# Patient Record
Sex: Female | Born: 1950
Health system: Southern US, Community
[De-identification: ages and names within clinical notes are randomized; demographics above are authoritative.]

## PROBLEM LIST (undated history)

## (undated) DIAGNOSIS — G43909 Migraine, unspecified, not intractable, without status migrainosus: Secondary | ICD-10-CM

## (undated) DIAGNOSIS — J069 Acute upper respiratory infection, unspecified: Secondary | ICD-10-CM

## (undated) DIAGNOSIS — R011 Cardiac murmur, unspecified: Secondary | ICD-10-CM

## (undated) DIAGNOSIS — Z8041 Family history of malignant neoplasm of ovary: Secondary | ICD-10-CM

## (undated) DIAGNOSIS — Z8049 Family history of malignant neoplasm of other genital organs: Secondary | ICD-10-CM

## (undated) DIAGNOSIS — D219 Benign neoplasm of connective and other soft tissue, unspecified: Secondary | ICD-10-CM

## (undated) DIAGNOSIS — B359 Dermatophytosis, unspecified: Secondary | ICD-10-CM

## (undated) DIAGNOSIS — K219 Gastro-esophageal reflux disease without esophagitis: Secondary | ICD-10-CM

## (undated) DIAGNOSIS — I10 Essential (primary) hypertension: Secondary | ICD-10-CM

## (undated) DIAGNOSIS — E7800 Pure hypercholesterolemia, unspecified: Secondary | ICD-10-CM

## (undated) DIAGNOSIS — M1990 Unspecified osteoarthritis, unspecified site: Secondary | ICD-10-CM

## (undated) DIAGNOSIS — H269 Unspecified cataract: Secondary | ICD-10-CM

## (undated) DIAGNOSIS — T783XXA Angioneurotic edema, initial encounter: Secondary | ICD-10-CM

## (undated) DIAGNOSIS — Z8489 Family history of other specified conditions: Secondary | ICD-10-CM

## (undated) DIAGNOSIS — Z800 Family history of malignant neoplasm of digestive organs: Secondary | ICD-10-CM

## (undated) HISTORY — DX: Dermatophytosis, unspecified: B35.9

## (undated) HISTORY — PX: TONSILLECTOMY: SUR1361

## (undated) HISTORY — PX: BUNIONECTOMY: SHX129

## (undated) HISTORY — PX: OTHER SURGICAL HISTORY: SHX169

## (undated) HISTORY — DX: Essential (primary) hypertension: I10

## (undated) HISTORY — PX: JOINT REPLACEMENT: SHX530

## (undated) HISTORY — DX: Family history of malignant neoplasm of ovary: Z80.41

## (undated) HISTORY — DX: Unspecified cataract: H26.9

## (undated) HISTORY — DX: Family history of malignant neoplasm of digestive organs: Z80.0

## (undated) HISTORY — PX: EYE SURGERY: SHX253

## (undated) HISTORY — DX: Acute upper respiratory infection, unspecified: J06.9

## (undated) HISTORY — DX: Family history of malignant neoplasm of other genital organs: Z80.49

## (undated) HISTORY — DX: Benign neoplasm of connective and other soft tissue, unspecified: D21.9

## (undated) HISTORY — DX: Angioneurotic edema, initial encounter: T78.3XXA

---
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
## 1988-10-20 HISTORY — PX: TOTAL ABDOMINAL HYSTERECTOMY: SHX209

## 1998-10-04 ENCOUNTER — Ambulatory Visit (HOSPITAL_BASED_OUTPATIENT_CLINIC_OR_DEPARTMENT_OTHER): Admission: RE | Admit: 1998-10-04 | Discharge: 1998-10-04 | Payer: Self-pay | Admitting: Orthopaedic Surgery

## 1999-04-17 ENCOUNTER — Encounter: Admission: RE | Admit: 1999-04-17 | Discharge: 1999-04-17 | Payer: Self-pay | Admitting: Family Medicine

## 1999-06-03 ENCOUNTER — Other Ambulatory Visit: Admission: RE | Admit: 1999-06-03 | Discharge: 1999-06-03 | Payer: Self-pay | Admitting: Obstetrics and Gynecology

## 2000-06-18 ENCOUNTER — Other Ambulatory Visit: Admission: RE | Admit: 2000-06-18 | Discharge: 2000-06-18 | Payer: Self-pay | Admitting: Obstetrics and Gynecology

## 2000-11-09 ENCOUNTER — Ambulatory Visit (HOSPITAL_COMMUNITY): Admission: RE | Admit: 2000-11-09 | Discharge: 2000-11-09 | Payer: Self-pay | Admitting: Internal Medicine

## 2001-05-01 ENCOUNTER — Encounter: Payer: Self-pay | Admitting: Orthopedic Surgery

## 2001-05-01 ENCOUNTER — Encounter: Admission: RE | Admit: 2001-05-01 | Discharge: 2001-05-01 | Payer: Self-pay | Admitting: Orthopedic Surgery

## 2001-06-23 ENCOUNTER — Other Ambulatory Visit: Admission: RE | Admit: 2001-06-23 | Discharge: 2001-06-23 | Payer: Self-pay | Admitting: Obstetrics and Gynecology

## 2002-04-21 ENCOUNTER — Encounter: Admission: RE | Admit: 2002-04-21 | Discharge: 2002-04-21 | Payer: Self-pay | Admitting: Orthopaedic Surgery

## 2002-04-21 ENCOUNTER — Encounter: Payer: Self-pay | Admitting: Orthopaedic Surgery

## 2002-05-11 ENCOUNTER — Encounter: Payer: Self-pay | Admitting: Orthopaedic Surgery

## 2002-05-11 ENCOUNTER — Encounter: Admission: RE | Admit: 2002-05-11 | Discharge: 2002-05-11 | Payer: Self-pay | Admitting: Orthopaedic Surgery

## 2002-05-30 ENCOUNTER — Encounter: Admission: RE | Admit: 2002-05-30 | Discharge: 2002-05-30 | Payer: Self-pay | Admitting: Orthopaedic Surgery

## 2002-05-30 ENCOUNTER — Encounter: Payer: Self-pay | Admitting: Orthopaedic Surgery

## 2002-06-29 ENCOUNTER — Other Ambulatory Visit: Admission: RE | Admit: 2002-06-29 | Discharge: 2002-06-29 | Payer: Self-pay | Admitting: Obstetrics and Gynecology

## 2002-08-11 ENCOUNTER — Encounter: Admission: RE | Admit: 2002-08-11 | Discharge: 2002-08-11 | Payer: Self-pay | Admitting: Orthopaedic Surgery

## 2002-08-11 ENCOUNTER — Encounter: Payer: Self-pay | Admitting: Orthopaedic Surgery

## 2003-07-05 ENCOUNTER — Other Ambulatory Visit: Admission: RE | Admit: 2003-07-05 | Discharge: 2003-07-05 | Payer: Self-pay | Admitting: Obstetrics and Gynecology

## 2003-11-30 ENCOUNTER — Encounter: Admission: RE | Admit: 2003-11-30 | Discharge: 2003-11-30 | Payer: Self-pay | Admitting: Orthopaedic Surgery

## 2003-12-20 ENCOUNTER — Encounter: Admission: RE | Admit: 2003-12-20 | Discharge: 2003-12-20 | Payer: Self-pay | Admitting: Orthopaedic Surgery

## 2004-01-05 ENCOUNTER — Encounter: Admission: RE | Admit: 2004-01-05 | Discharge: 2004-01-05 | Payer: Self-pay | Admitting: Internal Medicine

## 2004-08-01 ENCOUNTER — Other Ambulatory Visit: Admission: RE | Admit: 2004-08-01 | Discharge: 2004-08-01 | Payer: Self-pay | Admitting: Obstetrics and Gynecology

## 2004-08-01 ENCOUNTER — Encounter: Admission: RE | Admit: 2004-08-01 | Discharge: 2004-09-30 | Payer: Self-pay | Admitting: Orthopaedic Surgery

## 2004-10-11 ENCOUNTER — Encounter: Admission: RE | Admit: 2004-10-11 | Discharge: 2004-10-11 | Payer: Self-pay | Admitting: Orthopaedic Surgery

## 2004-11-06 ENCOUNTER — Encounter: Admission: RE | Admit: 2004-11-06 | Discharge: 2004-12-12 | Payer: Self-pay | Admitting: Orthopaedic Surgery

## 2005-08-05 ENCOUNTER — Other Ambulatory Visit: Admission: RE | Admit: 2005-08-05 | Discharge: 2005-08-05 | Payer: Self-pay | Admitting: Obstetrics and Gynecology

## 2006-01-18 ENCOUNTER — Encounter: Admission: RE | Admit: 2006-01-18 | Discharge: 2006-01-18 | Payer: Self-pay | Admitting: Orthopaedic Surgery

## 2006-02-24 ENCOUNTER — Encounter: Admission: RE | Admit: 2006-02-24 | Discharge: 2006-02-24 | Payer: Self-pay | Admitting: Ophthalmology

## 2006-04-01 ENCOUNTER — Encounter: Admission: RE | Admit: 2006-04-01 | Discharge: 2006-04-01 | Payer: Self-pay | Admitting: Orthopaedic Surgery

## 2006-07-28 ENCOUNTER — Other Ambulatory Visit: Admission: RE | Admit: 2006-07-28 | Discharge: 2006-07-28 | Payer: Self-pay | Admitting: Obstetrics and Gynecology

## 2006-10-06 ENCOUNTER — Encounter: Admission: RE | Admit: 2006-10-06 | Discharge: 2006-10-06 | Payer: Self-pay | Admitting: Orthopaedic Surgery

## 2007-03-26 ENCOUNTER — Encounter: Admission: RE | Admit: 2007-03-26 | Discharge: 2007-03-26 | Payer: Self-pay | Admitting: Otolaryngology

## 2007-04-15 ENCOUNTER — Encounter: Admission: RE | Admit: 2007-04-15 | Discharge: 2007-04-15 | Payer: Self-pay | Admitting: Otolaryngology

## 2007-08-04 ENCOUNTER — Other Ambulatory Visit: Admission: RE | Admit: 2007-08-04 | Discharge: 2007-08-04 | Payer: Self-pay | Admitting: Obstetrics and Gynecology

## 2008-08-04 ENCOUNTER — Ambulatory Visit: Payer: Self-pay | Admitting: Obstetrics and Gynecology

## 2008-08-04 ENCOUNTER — Encounter: Payer: Self-pay | Admitting: Obstetrics and Gynecology

## 2008-08-04 ENCOUNTER — Other Ambulatory Visit: Admission: RE | Admit: 2008-08-04 | Discharge: 2008-08-04 | Payer: Self-pay | Admitting: Obstetrics and Gynecology

## 2008-10-16 ENCOUNTER — Ambulatory Visit: Payer: Self-pay | Admitting: Gastroenterology

## 2008-11-02 ENCOUNTER — Telehealth: Payer: Self-pay | Admitting: Gastroenterology

## 2008-11-03 ENCOUNTER — Ambulatory Visit: Payer: Self-pay | Admitting: Gastroenterology

## 2008-11-13 ENCOUNTER — Telehealth: Payer: Self-pay | Admitting: Gastroenterology

## 2008-11-16 ENCOUNTER — Ambulatory Visit: Payer: Self-pay | Admitting: Obstetrics and Gynecology

## 2008-12-21 ENCOUNTER — Ambulatory Visit: Payer: Self-pay | Admitting: Obstetrics and Gynecology

## 2009-08-21 ENCOUNTER — Ambulatory Visit: Payer: Self-pay | Admitting: Obstetrics and Gynecology

## 2009-08-31 ENCOUNTER — Ambulatory Visit: Payer: Self-pay | Admitting: Obstetrics and Gynecology

## 2009-08-31 ENCOUNTER — Encounter: Payer: Self-pay | Admitting: Obstetrics and Gynecology

## 2009-08-31 ENCOUNTER — Other Ambulatory Visit: Admission: RE | Admit: 2009-08-31 | Discharge: 2009-08-31 | Payer: Self-pay | Admitting: Obstetrics and Gynecology

## 2009-12-17 ENCOUNTER — Ambulatory Visit: Payer: Self-pay | Admitting: Obstetrics and Gynecology

## 2009-12-24 ENCOUNTER — Ambulatory Visit: Payer: Self-pay | Admitting: Obstetrics and Gynecology

## 2010-09-09 ENCOUNTER — Encounter: Admission: RE | Admit: 2010-09-09 | Discharge: 2010-09-09 | Payer: Self-pay | Admitting: Orthopaedic Surgery

## 2010-10-20 HISTORY — PX: TOTAL KNEE ARTHROPLASTY: SHX125

## 2010-11-04 ENCOUNTER — Other Ambulatory Visit
Admission: RE | Admit: 2010-11-04 | Discharge: 2010-11-04 | Payer: Self-pay | Source: Home / Self Care | Admitting: Obstetrics and Gynecology

## 2010-11-04 ENCOUNTER — Ambulatory Visit
Admission: RE | Admit: 2010-11-04 | Discharge: 2010-11-04 | Payer: Self-pay | Source: Home / Self Care | Attending: Obstetrics and Gynecology | Admitting: Obstetrics and Gynecology

## 2010-11-09 ENCOUNTER — Encounter: Payer: Self-pay | Admitting: Orthopaedic Surgery

## 2011-04-16 ENCOUNTER — Encounter (HOSPITAL_COMMUNITY)
Admission: RE | Admit: 2011-04-16 | Discharge: 2011-04-16 | Disposition: A | Discharge status: Home / Self Care | Payer: BC Managed Care – PPO | Source: Ambulatory Visit | Attending: Orthopaedic Surgery | Admitting: Orthopaedic Surgery

## 2011-04-16 ENCOUNTER — Other Ambulatory Visit (HOSPITAL_COMMUNITY): Payer: Self-pay | Admitting: Orthopaedic Surgery

## 2011-04-16 ENCOUNTER — Ambulatory Visit (HOSPITAL_COMMUNITY)
Admission: RE | Admit: 2011-04-16 | Discharge: 2011-04-16 | Disposition: A | Discharge status: Home / Self Care | Payer: BC Managed Care – PPO | Source: Ambulatory Visit | Attending: Orthopaedic Surgery | Admitting: Orthopaedic Surgery

## 2011-04-16 DIAGNOSIS — M1990 Unspecified osteoarthritis, unspecified site: Secondary | ICD-10-CM

## 2011-04-16 DIAGNOSIS — Z01818 Encounter for other preprocedural examination: Secondary | ICD-10-CM | POA: Insufficient documentation

## 2011-04-16 DIAGNOSIS — Z01812 Encounter for preprocedural laboratory examination: Secondary | ICD-10-CM | POA: Insufficient documentation

## 2011-04-16 LAB — COMPREHENSIVE METABOLIC PANEL
Alkaline Phosphatase: 70 U/L (ref 39–117)
BUN: 20 mg/dL (ref 6–23)
CO2: 29 mEq/L (ref 19–32)
Chloride: 101 mEq/L (ref 96–112)
Creatinine, Ser: 0.62 mg/dL (ref 0.50–1.10)
GFR calc non Af Amer: >60 mL/min (ref >60)
Potassium: 4 mEq/L (ref 3.5–5.1)
Total Bilirubin: 0.6 mg/dL (ref 0.3–1.2)

## 2011-04-16 LAB — DIFFERENTIAL
Basophils Absolute: 0 10*3/uL (ref 0.0–0.1)
Basophils Relative: 0 % (ref 0–1)
Eosinophils Relative: 2 % (ref 0–5)
Lymphocytes Relative: 32 % (ref 12–46)

## 2011-04-16 LAB — URINALYSIS, ROUTINE W REFLEX MICROSCOPIC
Leukocytes, UA: NEGATIVE
Nitrite: NEGATIVE
Specific Gravity, Urine: 1.018 (ref 1.005–1.030)
Urobilinogen, UA: 0.2 mg/dL (ref 0.0–1.0)

## 2011-04-16 LAB — CBC
HCT: 38.4 % (ref 36.0–46.0)
Platelets: 300 10*3/uL (ref 150–400)
RDW: 12.6 % (ref 11.5–15.5)
WBC: 7.1 10*3/uL (ref 4.0–10.5)

## 2011-04-16 LAB — TYPE AND SCREEN
ABO/RH(D): A POS
Antibody Screen: NEGATIVE

## 2011-04-16 LAB — SURGICAL PCR SCREEN: MRSA, PCR: NEGATIVE

## 2011-04-16 LAB — ABO/RH: ABO/RH(D): A POS

## 2011-04-17 LAB — URINE CULTURE
Culture  Setup Time: 201206271636
Culture: NO GROWTH

## 2011-04-22 ENCOUNTER — Inpatient Hospital Stay (HOSPITAL_COMMUNITY)
Admission: RE | Admit: 2011-04-22 | Discharge: 2011-04-25 | DRG: 209 | Disposition: A | Discharge status: Home Health | Payer: BC Managed Care – PPO | Source: Ambulatory Visit | Attending: Orthopaedic Surgery | Admitting: Orthopaedic Surgery

## 2011-04-22 DIAGNOSIS — K3184 Gastroparesis: Secondary | ICD-10-CM | POA: Diagnosis not present

## 2011-04-22 DIAGNOSIS — D62 Acute posthemorrhagic anemia: Secondary | ICD-10-CM | POA: Diagnosis not present

## 2011-04-22 DIAGNOSIS — R7309 Other abnormal glucose: Secondary | ICD-10-CM | POA: Diagnosis not present

## 2011-04-22 DIAGNOSIS — M1710 Unilateral primary osteoarthritis, unspecified knee: Principal | ICD-10-CM | POA: Diagnosis present

## 2011-04-23 LAB — CBC
HCT: 28 % — ABNORMAL LOW (ref 36.0–46.0)
MCH: 31.5 pg (ref 26.0–34.0)
MCV: 91.8 fL (ref 78.0–100.0)
RDW: 12.5 % (ref 11.5–15.5)

## 2011-04-23 LAB — BASIC METABOLIC PANEL
BUN: 16 mg/dL (ref 6–23)
Calcium: 8.6 mg/dL (ref 8.4–10.5)
Chloride: 105 mEq/L (ref 96–112)
Creatinine, Ser: 0.59 mg/dL (ref 0.50–1.10)
GFR calc Af Amer: >60 mL/min (ref >60)

## 2011-04-24 DIAGNOSIS — M79609 Pain in unspecified limb: Secondary | ICD-10-CM

## 2011-04-24 LAB — CBC
HCT: 26.2 % — ABNORMAL LOW (ref 36.0–46.0)
MCHC: 34 g/dL (ref 30.0–36.0)
MCV: 90.3 fL (ref 78.0–100.0)
Platelets: 248 10*3/uL (ref 150–400)
RDW: 12.4 % (ref 11.5–15.5)

## 2011-04-24 LAB — BASIC METABOLIC PANEL
BUN: 25 mg/dL — ABNORMAL HIGH (ref 6–23)
Creatinine, Ser: 1.03 mg/dL (ref 0.50–1.10)
GFR calc Af Amer: >60 mL/min (ref >60)
GFR calc non Af Amer: 55 mL/min — ABNORMAL LOW (ref >60)
Potassium: 4.2 mEq/L (ref 3.5–5.1)

## 2011-04-25 LAB — BASIC METABOLIC PANEL
GFR calc Af Amer: >60 mL/min (ref >60)
GFR calc non Af Amer: >60 mL/min (ref >60)
Potassium: 3.1 mEq/L — ABNORMAL LOW (ref 3.5–5.1)
Sodium: 140 mEq/L (ref 135–145)

## 2011-04-25 LAB — CBC
MCHC: 34.6 g/dL (ref 30.0–36.0)
RDW: 12.5 % (ref 11.5–15.5)

## 2011-04-30 NOTE — Op Note (Signed)
Sharon [name], Sharon [name] NO.:  [id number]  MEDICAL RECORD NO.:  [id number]  LOCATION:  2550                         FACILITY:  [facility]  PHYSICIAN:  Claude Manges. Naren Benally, M.D.DATE OF BIRTH:  [birth year]  DATE OF PROCEDURE:  04/22/2011 DATE OF DISCHARGE:                              OPERATIVE REPORT   PREOPERATIVE DIAGNOSIS:  End-stage osteoarthritis, right knee.  POSTOPERATIVE DIAGNOSIS:  End-stage osteoarthritis, right knee.  PROCEDURE:  Right total knee replacement.  SURGEON:  Claude Manges. Cleophas Dunker, M.D.  ASSISTANT:  Arlys John D. Petrarca, P.A.-C.  ANESTHESIA:  General with supplemental femoral nerve block.  COMPLICATIONS:  None.  COMPONENTS:  DePuy LCS standard plus femoral component, #3 rotating tibial tray, a 12.5-mm polyethylene bridging bearing, a three peg metal back rotating patella.  Components were secured with polymethyl methacrylate.  PROCEDURE IN DETAIL:  Sharon [name] was met in the holding area, marked her right knee as the appropriate operative site.  The questions were answered.  She received a preoperative femoral nerve block by Anesthesia.  The patient was then transported to room #1 and placed under general anesthesia without difficulty.  Nursing staff inserted a Foley catheter.  Urine was clear.  Tourniquet was then applied to the right thigh.  The leg was then prepped with Betadine scrub and DuraPrep.  The tourniquet to the midfoot, sterile draping was performed.  The extremity still elevated was Esmarch exsanguinated with a proximal tourniquet at 350 mmHg.  A midline longitudinal incision was made centered about the patella extending from the superior pouch to tibial tubercle via sharp dissection.  Incision carried down to subcutaneous tissue.  Gross bleeders were Bovie coagulated.  The medial parapatellar incision was made with the Bovie through the deep capsule.  The joint was then entered.  There was a clear yellow joint effusion.  The  patella was everted to 180 degrees and knee flexed to 90 degrees.  There was mild to moderate amount of synovitis.  Synovectomy was performed.  There were large osteophytes along the medial and lateral femoral condyle and absence of articular cartilage in the medial femoral condyle and medial tibial plateau.  I templated a standard plus femoral component.  First, bony cut was made transversely in the proximal tibia using the external guide.  We then rechecked our alignment and felt that it was perfectly anatomic.  Subsequent cuts were then made on the femur with a 4-degree distal femoral valgus cut, 12.5 mm flexion/extension gaps were perfectly symmetrical.  MCL and LCL remained intact.  Lamina spreaders were then inserted.  The medial and lateral menisci were excised as well as the ACL and MCL.  Osteophytes removed from the posterior femoral condyles with a curved three-quarter inch osteotome. There was a Baker cyst posteromedially, this was partially excised.  Finishing cut was then made on the femur for tapering and to make the central holes.  Retractors were then placed around the tibia.  A #3 tibial tray was measured and pinned in place.  The central hole was made followed by the keeled cut.  With the tibial tray in place, the 12.5 mm bridging bearing was applied followed by the trial standard plus femoral component. Through a full range  of motion, we had full extension, flexion without malrotation or abnormality, but the tibial tray did not open up medially or laterally.  Patella was prepared by removing 10 mm of bone, three pegs were then made with the template.  Trial patella was inserted and through a full range of motion remained perfectly stable.  The trial components removed.  The joint was copiously irrigated with saline solution.  We changed gloves on several occasions to ensure sterility.  With the knee flexed the retractors were carefully placed around the  tibia.  The #3 tibial tray was then carefully cemented in place.  Extraneous methacrylate was removed from around the edges.  The 12.5-mm polyethylene bridging bearing was then inserted followed by the cemented femoral component.  The knee was placed in full extension.  We had excellent fit of all the components.  Any further extraneous methacrylate was removed from around the femur and the tibia.  Patella was applied with a patellar clamp and methacrylate.  After approximately 15 minutes, the methacrylate was matured and hardened.  The joint was inspected.  It was clear of any extraneous methacrylate.  Marcaine 0.25% with epinephrine was injected to the deep capsule.  At that point, the tourniquet was deflated about an hour and fifteen minutes.  We had immediate capillary refill to the joint.  Any gross bleeders were Bovie coagulated.  Bone wax was used to obtain hemostasis from any bone bleeding and pin insertion sites.  A medium Hemovac was inserted.  The deep capsule was closed with #1 interrupted Ethibond.  Superficial capsule with running 0 Vicryl, subcu with 2-0 Vicryl and 3-0 Monocryl, skin closed with skin clips.  Sterile bulky dressing was applied followed by the patient's support stocking and a Mepilex dressing.  The patient tolerated the procedure without complications.     Claude Manges. Cleophas Dunker, M.D.     PWW/MEDQ  D:  04/22/2011  T:  04/22/2011  Job:  [id number]  Electronically Signed by Norlene Campbell M.D. on 04/30/2011 11:42:21 AM

## 2011-05-12 ENCOUNTER — Ambulatory Visit (INDEPENDENT_AMBULATORY_CARE_PROVIDER_SITE_OTHER): Payer: BC Managed Care – PPO | Admitting: Obstetrics and Gynecology

## 2011-05-12 ENCOUNTER — Encounter: Payer: Self-pay | Admitting: Obstetrics and Gynecology

## 2011-05-12 DIAGNOSIS — R300 Dysuria: Secondary | ICD-10-CM

## 2011-05-12 MED ORDER — NITROFURANTOIN MONOHYD MACRO 100 MG PO CAPS: 100.0000 mg | ORAL_CAPSULE | Freq: Two times a day (BID) | ORAL | Status: AC

## 2011-05-12 NOTE — Progress Notes (Signed)
The patient came to see me today with a 3 day history of dysuria frequency and urgency. She had a very abnormal urinalysis showing multiple white blood cells and red blood cells she says she has very frequent infrequent urinary tract infections. Assessment urinary tract infection plan Macrobid 1 twice a day with food for 7 days return to the office in one week for followup urinalysis.

## 2011-05-16 NOTE — Discharge Summary (Signed)
NAMEBRONWEN, Sharon [name] NO.:  [id number]  MEDICAL RECORD NO.:  [id number]  LOCATION:  5038                         FACILITY:  [facility]  PHYSICIAN:  Claude Manges. Marche Hottenstein, M.D.DATE OF BIRTH:  [birth year]  DATE OF ADMISSION:  04/22/2011 DATE OF DISCHARGE:  04/25/2011                              DISCHARGE SUMMARY   ADMISSION DIAGNOSIS:  Osteoarthritis of the right knee.  DISCHARGE DIAGNOSES: 1. Osteoarthritis of the right knee. 2. History of hypertension. 3. History of migraines. 4. Acute blood loss anemia.  PROCEDURE:  Right total knee arthroplasty.  HISTORY:  Sharon [name] is a very pleasant 60 year old white female who was seen and evaluated in our office for some time.  She has been having constant moderate aching pain which is interfering with her activities of daily living and sleep.  She is also having nighttime pain.  She has radiographic bone-on-bone medial compartment OA.  She has failed conservative treatment.  She is indicated for right total knee arthroplasty.  HOSPITAL COURSE:  This is a 60 year old white female, admitted on April 22, 2011.  After appropriate laboratory studies were obtained as well as 1 g vancomycin IV on-call to the operating room, she was taken to the operating room where she underwent a right total knee arthroplasty by Sharon Campbell, MD, assisted by Oris Drone. Petrarca, PA-C.  This involved a DePuy LCS standard plus femoral component with a #3 rotating tibial tray and a 12.5 polyethylene bridging bearing and three-peg metal back rotating patella.  Components were all secured with polymethyl methacrylate.  She tolerated the procedure well.  She was placed on a Dilaudid PCA pump.  Xarelto was started at 10:00 p.m. on the day of surgery at 10 mg daily.  Thigh-high TED hose were ordered.  Foley was placed intraoperatively.  Vancomycin 1 g IV q.12 h. x1 dose was continued.  She was placed on Dilaudid 2 mg one to two p.o. q.4 h. p.r.n. pain.   IV Tylenol 1 g q.6 h. for four doses and then 1 g p.o. q.6 h. was ordered.  CPM was placed from 0-60 degrees for 8 hours per day. She may increase it by 5 degrees per day.  CONSULTATIONS:  Physical therapy for partial weightbearing and 50% body weight.  Toradol 30 mg IV was placed with her in PACU.  She was allowed out of bed to a chair the following day.  She did have a marked decreased bowel sounds and had nausea and vomiting.  I have started her on Reglan 10 mg p.o. q.8 h. x4 doses.  She was weaned off her PCA, weaned off O2.  Clear liquids were only ordered.  She did have her dressing changed on April 24, 2011.  She was having calf pain and stat Doppler was ordered.  She was started on iron sulfate 325 p.o. daily.  She had resolution in her nausea and hypoactive bowel sounds.  Her diet was advanced.  Remainder of her hospital course was uneventful.  She was discharged on April 25, 2011, after PT.  She was also given 40 mEq of KCl prior to discharge.  LABORATORY DATA:  She had a hemoglobin 9.6, hematocrit 28.7 with white count 12,100 with  platelets 261,000 on April 23, 2011.  Discharge hemoglobin was 8.9, hematocrit 25.7%, white count 7400, and platelets were 247,000.  On postop day 1, sodium 137, potassium 4.4, chloride 105, CO2 is 27, glucose 124 BUN 16, creatinine 0.59.  Discharge sodium 140, potassium 3.1, chloride 103, CO2 is 33, glucose 113, BUN 7, creatinine 0.48.  Her GFR was greater than 60 on April 23, 2011, on April 24, 2011, was 25 but returned to greater than 60 at the time of discharge.  No further laboratory studies were available at the time of this dictation.  DISCHARGE INSTRUCTIONS:  She is to resume her diet as prior to hospitalization.  She may increase her activity slowly and no lifting or driving for 6 weeks.  She may shower without dressing once there is no drainage.  She is not to wash over the wound.  If drainage remains, cover the wound with a plastic wrap and then  shower.  She is allowed to be 50% weightbearing with a walker.  Use her TED hose for 3 weeks on the right leg and may take them off at night time.  She is to be followed up in the office on May 05, 2011.  Genevieve Norlander for home health.  She may change her dressing on Saturday and then change dressing daily with sterile 4x4 gauze dressing and apply TED hose.  Keep the incision clean and dry, and she can use alcohol daily.  CPM from 0-70 degrees for 8 hours per day.  She may increase by 5 degrees per day.  CPM for 4-6 weeks.  She was discharged in improved condition.     Oris Drone Petrarca, P.A.-C.   ______________________________ Claude Manges. Cleophas Dunker, M.D.    BDP/MEDQ  D:  05/07/2011  T:  05/08/2011  Job:  [id number]  Electronically Signed by Jacqualine Code P.A.-C. on 05/13/2011 09:36:46 AM Electronically Signed by Sharon [name] M.D. on 05/16/2011 04:50:36 PM

## 2011-05-19 ENCOUNTER — Ambulatory Visit (INDEPENDENT_AMBULATORY_CARE_PROVIDER_SITE_OTHER): Payer: BC Managed Care – PPO | Admitting: *Deleted

## 2011-05-19 DIAGNOSIS — Z5189 Encounter for other specified aftercare: Secondary | ICD-10-CM

## 2011-05-19 DIAGNOSIS — R823 Hemoglobinuria: Secondary | ICD-10-CM

## 2011-05-19 NOTE — Progress Notes (Signed)
  Pt here for lab work, requested blood pressure check.  It was normal.

## 2011-05-20 ENCOUNTER — Telehealth: Payer: Self-pay

## 2011-05-20 NOTE — Progress Notes (Signed)
PT NOTIFIED OF NOTE BY DR. Eda Paschal. LEFT DETAILED MESSAGE @ HER WORK VOICEMAIL #

## 2011-05-20 NOTE — Telephone Encounter (Signed)
PT NOTIFIED OF URINE CULTURE & MICRO RESULTS PER DR. GOTTSEGEN-ONLY RBC'S NO FURTHER TREATMENT NEEDED. PER PT. STATES SHE IS CURRENTLY ASYMPTOMATIC.

## 2011-11-05 ENCOUNTER — Other Ambulatory Visit: Payer: Self-pay | Admitting: Obstetrics and Gynecology

## 2012-01-17 ENCOUNTER — Other Ambulatory Visit: Payer: Self-pay | Admitting: Obstetrics and Gynecology

## 2012-01-22 ENCOUNTER — Encounter: Payer: Self-pay | Admitting: Gynecology

## 2012-01-22 DIAGNOSIS — M1990 Unspecified osteoarthritis, unspecified site: Secondary | ICD-10-CM | POA: Insufficient documentation

## 2012-01-22 DIAGNOSIS — D219 Benign neoplasm of connective and other soft tissue, unspecified: Secondary | ICD-10-CM | POA: Insufficient documentation

## 2012-01-22 DIAGNOSIS — R519 Headache, unspecified: Secondary | ICD-10-CM | POA: Insufficient documentation

## 2012-01-22 DIAGNOSIS — I10 Essential (primary) hypertension: Secondary | ICD-10-CM | POA: Insufficient documentation

## 2012-01-22 DIAGNOSIS — R51 Headache: Secondary | ICD-10-CM | POA: Insufficient documentation

## 2012-01-22 DIAGNOSIS — E559 Vitamin D deficiency, unspecified: Secondary | ICD-10-CM | POA: Insufficient documentation

## 2012-01-28 ENCOUNTER — Encounter: Payer: Self-pay | Admitting: Obstetrics and Gynecology

## 2012-01-28 ENCOUNTER — Other Ambulatory Visit (HOSPITAL_COMMUNITY)
Admission: RE | Admit: 2012-01-28 | Discharge: 2012-01-28 | Disposition: A | Discharge status: Home / Self Care | Payer: PRIVATE HEALTH INSURANCE | Source: Ambulatory Visit | Attending: Obstetrics and Gynecology | Admitting: Obstetrics and Gynecology

## 2012-01-28 ENCOUNTER — Ambulatory Visit (INDEPENDENT_AMBULATORY_CARE_PROVIDER_SITE_OTHER): Payer: PRIVATE HEALTH INSURANCE | Admitting: Obstetrics and Gynecology

## 2012-01-28 VITALS — BP 120/76 | Ht 66.0 in | Wt 179.0 lb

## 2012-01-28 DIAGNOSIS — B359 Dermatophytosis, unspecified: Secondary | ICD-10-CM | POA: Insufficient documentation

## 2012-01-28 DIAGNOSIS — Z01419 Encounter for gynecological examination (general) (routine) without abnormal findings: Secondary | ICD-10-CM

## 2012-01-28 MED ORDER — CLIMARA 0.025 MG/24HR TD PTWK: 1.0000 | MEDICATED_PATCH | TRANSDERMAL | Status: DC

## 2012-01-28 NOTE — Progress Notes (Signed)
Patient came to see me today for her annual GYN exam. She is up-to-date on mammograms. She remains on her estrogen patch. She gets good relief of vasomotor symptoms with it. She is having no vaginal bleeding. She is having no pelvic pain. She does have some vaginal dryness but does well with a lubricant. She had a vitamin D deficiency which was corrected with 50,000 IUs  Of D. Weekly but she has stopped taking it.she has had 2 normal bone densities. She does her lab work from her PCP. She had a normal colonoscopy 3 years ago but does not feel that the GI doctor  knew that her maternal aunt had colon cancer and her mother had ovarian cancer.  HEENT: Within normal limits.Sharon [name] present. Neck: No masses. Supraclavicular lymph nodes: Not enlarged. Breasts: Examined in both sitting and lying position. Symmetrical without skin changes or masses. Abdomen: Soft no masses guarding or rebound. No hernias. Pelvic: External within normal limits. BUS within normal limits. Vaginal examination shows good estrogen effect, no cystocele enterocele or rectocele. Cervix and uterus absent. Adnexa within normal limits. Rectovaginal confirmatory. Extremities within normal limits.  Assessment: Vasomotor symptoms. Mild atrophic vaginitis. Vitamin D deficiency.  Plan: Patient to take 2000 IUs of vitamin D daily. Return in 4 months for vitamin D level. Mammogram in May. Discuss with GI doctor frequency of colonoscopys due to family history. She will also see if he feels she should do Hemoccult stools between colonoscopies.

## 2012-01-29 LAB — URINALYSIS W MICROSCOPIC + REFLEX CULTURE
Crystals: NONE SEEN
Ketones, ur: NEGATIVE mg/dL
Nitrite: NEGATIVE
Specific Gravity, Urine: 1.016 (ref 1.005–1.030)
Squamous Epithelial / LPF: NONE SEEN
Urobilinogen, UA: 0.2 mg/dL (ref 0.0–1.0)

## 2012-05-04 ENCOUNTER — Encounter: Payer: Self-pay | Admitting: Obstetrics and Gynecology

## 2012-06-23 ENCOUNTER — Ambulatory Visit (INDEPENDENT_AMBULATORY_CARE_PROVIDER_SITE_OTHER): Payer: BC Managed Care – PPO | Admitting: Physician Assistant

## 2012-06-23 VITALS — BP 126/80 | HR 60 | Temp 98.0°F | Resp 16 | Ht 66.0 in | Wt 181.0 lb

## 2012-06-23 DIAGNOSIS — H9202 Otalgia, left ear: Secondary | ICD-10-CM

## 2012-06-23 DIAGNOSIS — H6120 Impacted cerumen, unspecified ear: Secondary | ICD-10-CM

## 2012-06-23 DIAGNOSIS — H9209 Otalgia, unspecified ear: Secondary | ICD-10-CM

## 2012-06-23 NOTE — Progress Notes (Signed)
  Subjective:    Patient ID: Sharon [name], female    DOB: [birth year], 61 y.o.   MRN: [id number]  HPI Pt presents to clinic with L ear pain that started this am but has worsened throughout the day.  She has had no recent colds or congestion.  No hearing problems or ringing in her ears.  She has tried no medications for this.   Review of Systems  Constitutional: Negative for fever and chills.  HENT: Positive for ear pain. Negative for hearing loss, congestion, tinnitus and ear discharge.   Neurological: Negative for dizziness and light-headedness.       Objective:   Physical Exam  Vitals reviewed. Constitutional: She appears well-developed and well-nourished.  HENT:  Head: Normocephalic and atraumatic.  Right Ear: Hearing, tympanic membrane and external ear normal. A foreign body (cerumen) is present.  Left Ear: Hearing, tympanic membrane and external ear normal. A foreign body (cerumen ) is present.  Nose: Nose normal.  Neck: Neck supple.  Pulmonary/Chest: Effort normal.  Lymphadenopathy:    She has no cervical adenopathy.  Skin: Skin is warm and dry.  Psychiatric: She has a normal mood and affect. Her behavior is normal. Judgment and thought content normal.   After ear lavage - pt has clear canals without any cerumen remaining. Pt's pain is resolved.       Assessment & Plan:   1. Left ear pain   2. Cerumen impaction    Pt to use H2O2 to prevent this in the future.

## 2012-07-30 IMAGING — CR DG CHEST 2V
2 series · 2 of 2 positions shown · non-contrast
Comparison: 01/05/2004.

CLINICAL DATA: Preop.

CHEST - 2 VIEW

[view not recorded (1 of 2)]
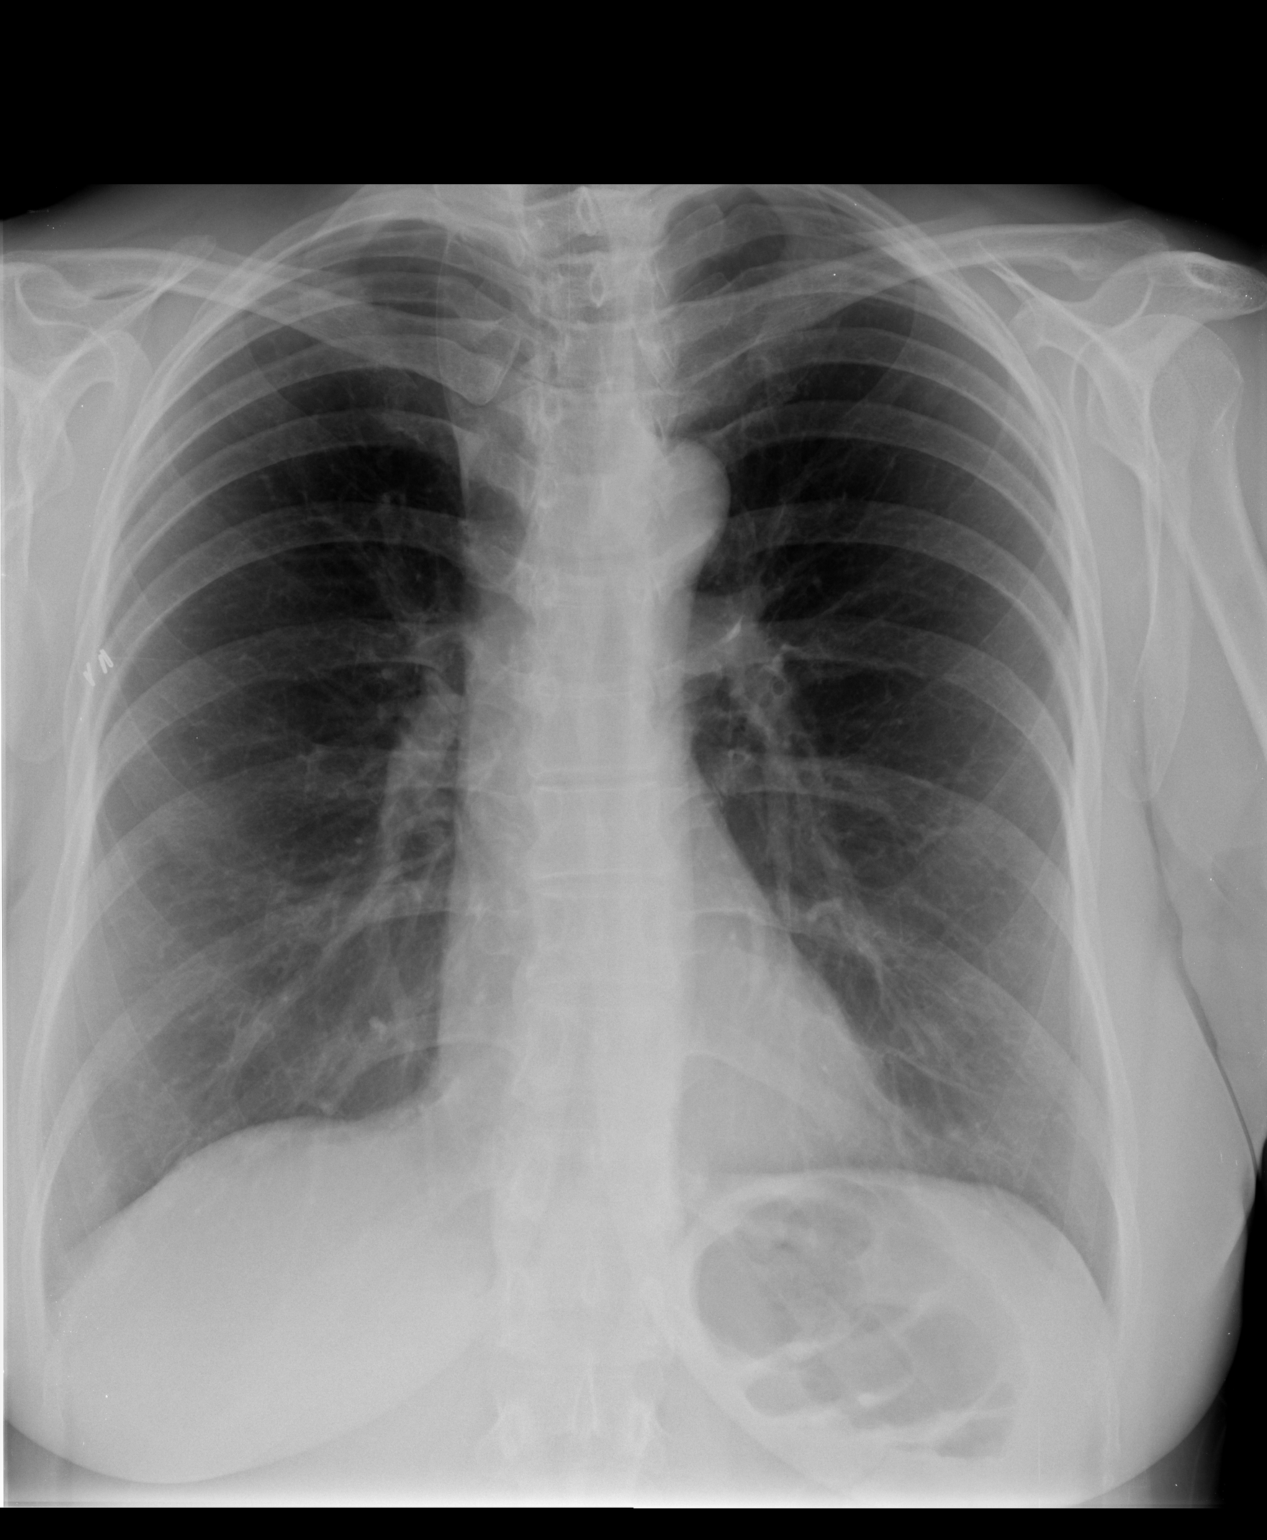

[view not recorded (2 of 2)]
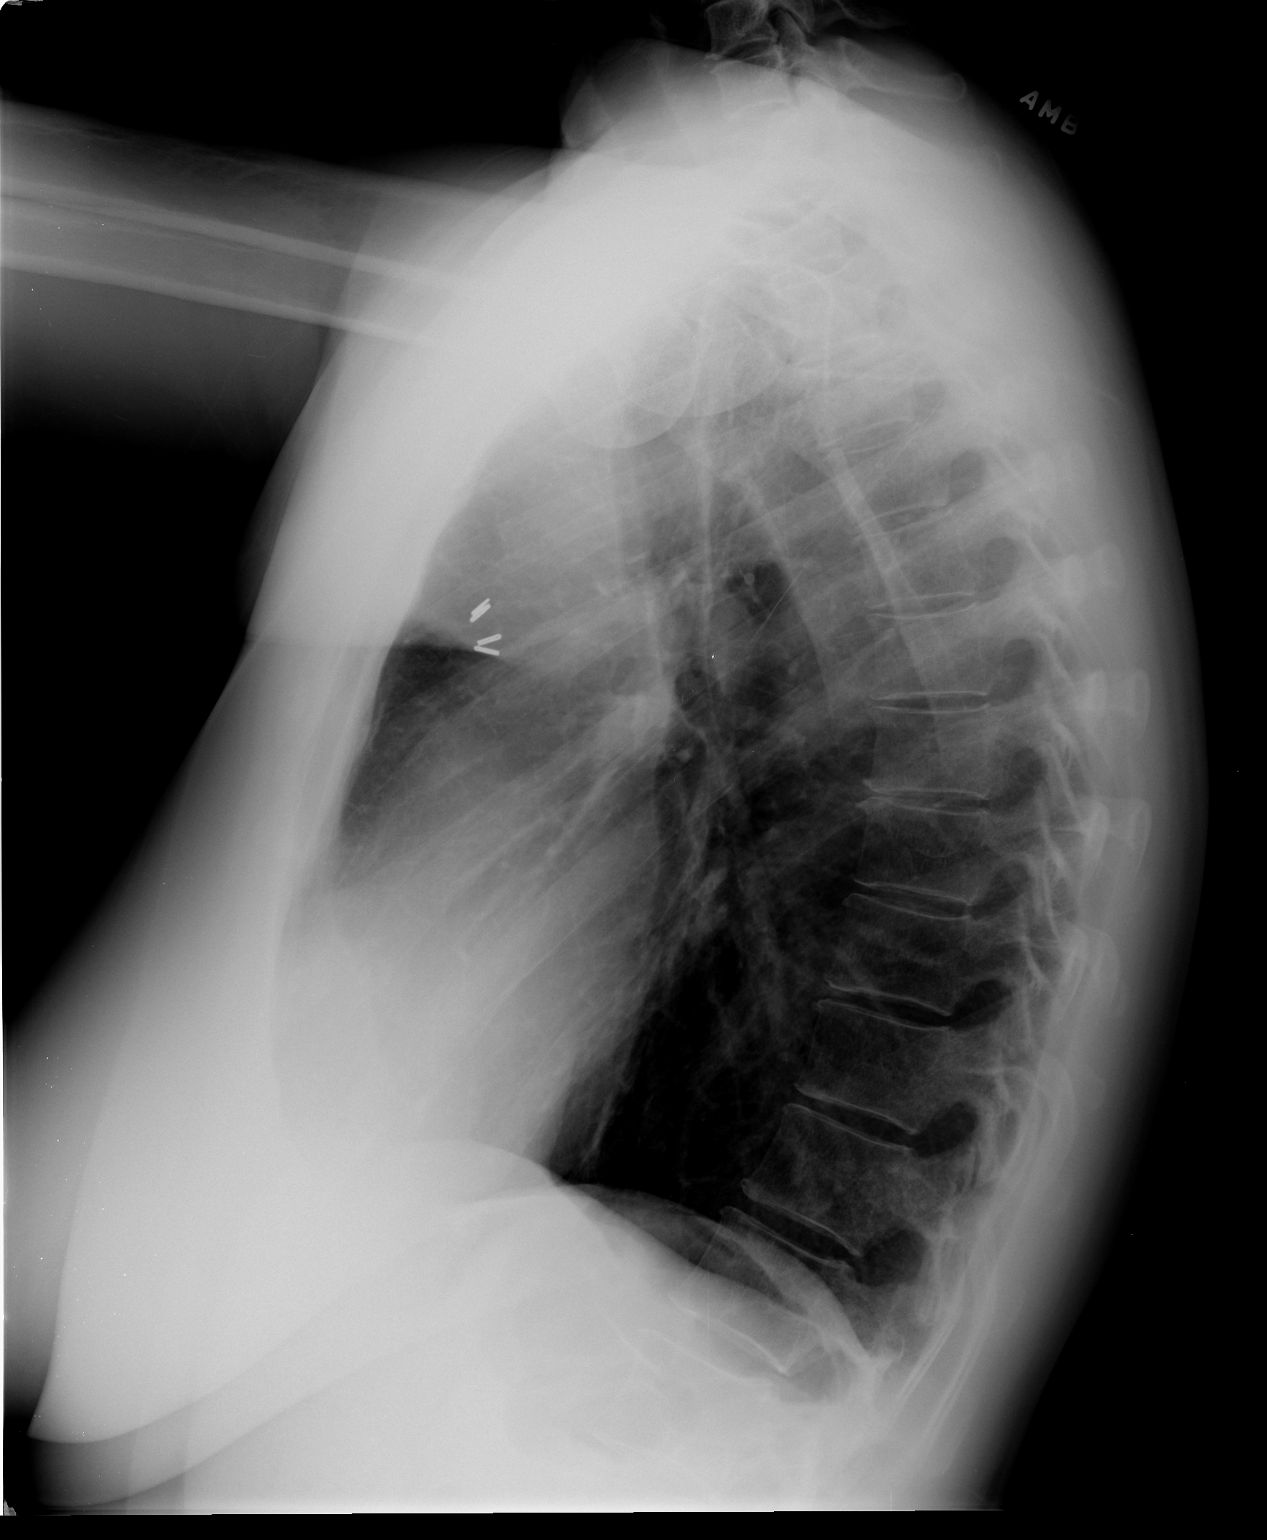

[2 of 2 positions shown; findings below may reference images not displayed]

FINDINGS: Trachea is midline.  Heart size normal.  Heart size
normal.  Azygos fissure is incidentally noted.  Lungs are clear.
No pleural fluid.  Surgical clips are seen in the right axillary
region.
IMPRESSION: No acute findings.

## 2012-10-11 ENCOUNTER — Other Ambulatory Visit: Payer: Self-pay | Admitting: *Deleted

## 2012-10-11 DIAGNOSIS — E559 Vitamin D deficiency, unspecified: Secondary | ICD-10-CM

## 2012-10-12 ENCOUNTER — Other Ambulatory Visit: Payer: BC Managed Care – PPO

## 2012-10-12 DIAGNOSIS — E559 Vitamin D deficiency, unspecified: Secondary | ICD-10-CM

## 2012-12-02 ENCOUNTER — Encounter: Payer: BC Managed Care – PPO | Admitting: Sports Medicine

## 2012-12-22 ENCOUNTER — Ambulatory Visit (INDEPENDENT_AMBULATORY_CARE_PROVIDER_SITE_OTHER): Payer: BC Managed Care – PPO | Admitting: Sports Medicine

## 2012-12-22 ENCOUNTER — Encounter: Payer: Self-pay | Admitting: Sports Medicine

## 2012-12-22 VITALS — Ht 66.0 in | Wt 181.0 lb

## 2012-12-22 DIAGNOSIS — M25476 Effusion, unspecified foot: Secondary | ICD-10-CM

## 2012-12-22 DIAGNOSIS — R269 Unspecified abnormalities of gait and mobility: Secondary | ICD-10-CM

## 2012-12-22 NOTE — Patient Instructions (Signed)
Very nice to meet you For your ankle wear the compression when you are on your feet for an extended amount of time.  Wear the insoles and see if they help.  Start by wearing them for 2 hours today and increase one hour each day. If helping we can replace this insole every 4-6 months If good but not all the way better than come back in 6-8 weeks and we will make custom orthotics.

## 2012-12-22 NOTE — Assessment & Plan Note (Signed)
Patient gait abnormality was improved with sports insoles with scaphoid pad a medial heel wedge. Patient seems to be doing better and was very comfortable in these temporary orthotics. Patient try these and if improves but not completely she'll come back for custom orthotics. Otherwise patient can come back every 4-6 months to have these sports insoles replaced.

## 2012-12-22 NOTE — Assessment & Plan Note (Signed)
Patient did have ankle swelling of the right ribs and left. Patient was given a body helix compression sleeve. Patient will wear this when she is on her feet for an extended amount time.

## 2012-12-22 NOTE — Progress Notes (Signed)
Chief complaint: Bilateral foot pain  History of present illness: Patient is a very pleasant 62 year old female who recently has had a knee replacement coming in with bilateral foot pain. Patient had seen Dr. Darrick Penna previously approximately 10 years ago for orthotics that were custom-made. Patient was wearing these regularly and had another pair made by a podiatrist in the interim. Patient states that she was doing very well but since her total knee replacement on the right side she's had worsening pain with her orthotics in. Patient feels like they're too hard and she cannot tolerate them anymore at this time. Patient has recently bought new shoes and would like to have another pair of orthotics I would be more comfortable. Patient denies any numbness in the extremities, patient does have swelling of the ankles bilaterally right and left. Patient describes the pain more as a constant dull aching sensation that can be sharp with certain movements. Patient denies any weakness of the large release bilaterally.  Past Medical History  Diagnosis Date  . Hypertension   . Fibroid   . Headache   . Arthritis     Osteoarthritis  . Vitamin D deficiency   . Ringworm    Past Surgical History  Procedure Laterality Date  . Abdominal hysterectomy  1990    TAH BSO  . Appendectomy    . Tonsillectomy    . Foot surgery    . Knee surgery      arthroscopic L knee  . Precancerous lesion excised    . Oophorectomy      BSO   Family History  Problem Relation Age of Onset  . Ovarian cancer Mother   . Colon cancer Maternal Aunt   . Diabetes Paternal Aunt   . Ovarian cancer Maternal Grandmother   . Heart disease Maternal Grandfather   . Hypertension Paternal Grandmother   . Ovarian cancer Cousin   \ History  Substance Use Topics  . Smoking status: Never Smoker   . Smokeless tobacco: Never Used  . Alcohol Use: 1.5 oz/week    3 drink(s) per week    Physical exam Height 5\' 6"  (1.676 m), weight 181 lb  (82.101 kg). General: No apparent distress alert and oriented x3 mood and affect normal Respiratory: Patient's speak in full sentences and does not appear short of breath Skin: Warm dry intact with no signs of infection or rash Neuro: Cranial nerves II through XII are intact, neurovascularly intact in all extremities with 2+ DTRs and 2+ pulses. Exam: On inspection patient does have trace effusion of the ankles bilaterally right greater than left. On ankle range of motion testing patient does have an anterior drawer on the right side. Patient does have significant swelling of the tarsal tunnel region. Patient denies any numbness has 5 out of 5 strength of the ankles bilaterally. On standing patient does have breakdown of the longitudinal and transverse arch bilaterally. Patient does have splaying of the second and third toes on the left foot and does have some hammering toes of the fourth and fifth toes bilaterally. Patient also has over pronation of the hindfoot bilaterally right greater than left. Leg lengths are within a quarter of an inch.  Gait analysis: Patient does walk with a near normal gait but does have over pronation of the hindfoot and some mild external rotation of the right leg. Otherwise patient in a fairly neutral position.  The patient was fitted with sports insoles with a scaphoid pad as well as a posterior heel wedge on  the medial aspect of the right heel. Patient's ambulation was a much more neutral position but still had some mild external rotation of the right leg.

## 2012-12-29 ENCOUNTER — Telehealth: Payer: Self-pay

## 2012-12-29 NOTE — Telephone Encounter (Signed)
Left message to call. Of note, patient does NOT have annual exam scheduled yet.

## 2012-12-29 NOTE — Telephone Encounter (Signed)
Message copied by Keenan Bachelor on Wed Dec 29, 2012 10:51 AM ------      Message from: Aura Camps      Created: Wed Dec 29, 2012 10:37 AM                   ----- Message -----         From: Ok Edwards, MD         Sent: 12/28/2012   5:52 PM           To: Betsey Amen, please inform patient that I received blood work from her orthopedic physician and that once again her vitamin D level was low. Value was 18 and normal is 32-100. Did her orthopedic surgeon put her back on vitamin D?  Review of patient's records indicated that she had been on vitamin D 50,000 units every other week in the past and was currently not taking it. She is scheduled for annual exam next month make sure that is scheduled also. ------

## 2013-01-04 NOTE — Telephone Encounter (Signed)
Left message home recorder to call me. 

## 2013-01-18 NOTE — Telephone Encounter (Signed)
Left message on patient's home recorder that I have lab results from orthopedist and Dr. Glenetta Hew wanted me to let her know his recommendation.

## 2013-01-24 NOTE — Telephone Encounter (Signed)
Please tell her that it is okay with me I understand.

## 2013-01-24 NOTE — Telephone Encounter (Signed)
Letter sent to call. Copy is in e-chart.

## 2013-01-24 NOTE — Telephone Encounter (Signed)
Patient called today. She said she has been out of town.  She said she has Vitamin D3 2000 units but has not been taking it regularly.  She is going to call back to schedule CE later this afternoon when she gets home to her calendar.  She plans to see Dr. Velvet Bathe because she is uncomfortable with the idea of seeing you as MD as you attend church where she does.  She said she has heard you are a great MD but she just would feel more comfortable not seeing her gyn outside of office.

## 2013-01-25 NOTE — Telephone Encounter (Signed)
CE scheduled with Dr. Velvet Bathe for January 31, 2013.

## 2013-02-14 ENCOUNTER — Other Ambulatory Visit: Payer: Self-pay | Admitting: Obstetrics and Gynecology

## 2013-02-16 ENCOUNTER — Encounter: Payer: BC Managed Care – PPO | Admitting: Gynecology

## 2013-03-21 ENCOUNTER — Ambulatory Visit (INDEPENDENT_AMBULATORY_CARE_PROVIDER_SITE_OTHER): Payer: BC Managed Care – PPO | Admitting: Gynecology

## 2013-03-21 ENCOUNTER — Encounter: Payer: Self-pay | Admitting: Gynecology

## 2013-03-21 ENCOUNTER — Encounter: Payer: Self-pay | Admitting: Obstetrics and Gynecology

## 2013-03-21 VITALS — BP 130/86 | Ht 66.25 in | Wt 173.0 lb

## 2013-03-21 DIAGNOSIS — N952 Postmenopausal atrophic vaginitis: Secondary | ICD-10-CM

## 2013-03-21 DIAGNOSIS — E559 Vitamin D deficiency, unspecified: Secondary | ICD-10-CM

## 2013-03-21 DIAGNOSIS — Z01419 Encounter for gynecological examination (general) (routine) without abnormal findings: Secondary | ICD-10-CM

## 2013-03-21 NOTE — Patient Instructions (Signed)
Office will contact you to arrange genetic counseling. Followup in one year, sooner as needed.

## 2013-03-21 NOTE — Progress Notes (Signed)
Sharon [name] 04-01-1951 [id number]        62 y.o.  G2P2002 for annual exam.  Former patient of Dr. Eda Paschal with several issues noted below.  Past medical history,surgical history, medications, allergies, family history and social history were all reviewed and documented in the EPIC chart. ROS:  Was performed and pertinent positives and negatives are included in the history.  Exam: Biomedical scientist Filed Vitals:   03/21/13 1122  BP: 130/86  Height: 5' 6.25" (1.683 m)  Weight: 173 lb (78.472 kg)   General appearance  Normal Skin grossly normal Head/Neck normal with no cervical or supraclavicular adenopathy thyroid normal Lungs  clear Cardiac RR, without RMG Abdominal  soft, nontender, without masses, organomegaly or hernia Breasts  examined lying and sitting without masses, retractions, discharge or axillary adenopathy. Pelvic  Ext/BUS/vagina  normal with atrophic changes  Adnexa  Without masses or tenderness    Anus and perineum  normal   Rectovaginal  normal sphincter tone without palpated masses or tenderness.    Assessment/Plan:  62 y.o. [id number] female for annual exam.   1. HRT. Patient on Climara 0.025. History of TAH/BSO with leiomyoma and strong family history of ovarian cancer. I reviewed the HRT issue with her to include the WHI study with increased risk of stroke heart attack DVT and breast cancer. ACOG and NAMS statements for lowest dose the shortest period of time. Transdermal versus oral first-pass effect benefits. Possible benefits from a cardiovascular standpoint and bone health. After lengthy discussion given the low dose and she is on I recommended stopping and seeing how she does. If she would develop significant symptoms and wants to restart and she will call me. 2. Strong history of ovarian cancer. Patient's mother and maternal grandmother both had ovarian cancer and her maternal grandmother also had uterine cancer. A cousin on her mother's side also had  ovarian cancer. Apparently the cousins were checked and negative for genetic carrier but her mother and grandmother were not screened. I reviewed with her that she still would be considered a risk with her mother and grandmother's history. Options for BRCA testing now or referral to a genetic counselor discussed. She is status post BSO with her TAH but I discussed the significant increased risk of breast cancer associated with a positive gene. If she was gene positive or had a calculated high risk then she may consider increasing surveillance with MRI or considering prophylactic mastectomy. Patient agrees with genetic counseling and will refer to [facility]. Last mammogram 04/2012. Continue with annual mammography and SBE monthly.  3. Pap smear 2013. No Pap smear done today. No history of significant abnormal Pap smears. She is status post TAH/BSO for benign indications. Reviewed current screening guidelines and osseous to stop screening altogether are less frequent screening alternatives reviewed. Will readdress on an annual basis. 4. Colonoscopy 2010. Patient to call to see what she needs to have this repeated. 5. DEXA 2010 normal. Offered repeat next year 5 year interval are waiting to age 51 and we'll rediscuss next year. Increase calcium vitamin D reviewed. She is known to have a low vitamin D level and is going to have this checked at Dr. Thomasene Lot office next week when she has blood work done. 6. Health maintenance. No lab work done as she's going to have this all done to Dr. Thomasene Lot office next week. Followup in 1 year, sooner as needed.   [name], 11:55 AM 03/21/2013

## 2013-03-22 ENCOUNTER — Telehealth: Payer: Self-pay | Admitting: *Deleted

## 2013-03-22 NOTE — Telephone Encounter (Signed)
Referral faxed to [facility]. They will contact pt to schedule the below appt.

## 2013-03-22 NOTE — Telephone Encounter (Signed)
Message copied by Aura Camps on Tue Mar 22, 2013  9:16 AM ------      Message from: Dara Lords      Created: Mon Mar 21, 2013 11:54 AM       Patient needs information to make an appointment with genetic counselor at [facility] in reference to strong family history of ovarian cancer ------

## 2013-03-23 NOTE — Telephone Encounter (Signed)
appt 05/12/13

## 2013-03-29 ENCOUNTER — Other Ambulatory Visit (HOSPITAL_COMMUNITY): Payer: Self-pay | Admitting: Internal Medicine

## 2013-03-29 DIAGNOSIS — R079 Chest pain, unspecified: Secondary | ICD-10-CM

## 2013-04-01 ENCOUNTER — Ambulatory Visit (HOSPITAL_COMMUNITY)
Admission: RE | Admit: 2013-04-01 | Discharge: 2013-04-01 | Disposition: A | Discharge status: Home / Self Care | Payer: BC Managed Care – PPO | Source: Ambulatory Visit | Attending: Internal Medicine | Admitting: Internal Medicine

## 2013-04-01 DIAGNOSIS — R079 Chest pain, unspecified: Secondary | ICD-10-CM

## 2013-05-03 ENCOUNTER — Telehealth: Payer: Self-pay | Admitting: *Deleted

## 2013-05-03 MED ORDER — CLIMARA 0.025 MG/24HR TD PTWK: MEDICATED_PATCH | TRANSDERMAL | Status: DC

## 2013-05-03 NOTE — Telephone Encounter (Signed)
We can refill her times a month. Certainly if she finds that she feels better on a low dose estrogen as far as her joints then she may want to continue that. We talked about the risks previously and as long as she is understanding and accepting of them I would refill her for one month and if she wants longer we can do that.

## 2013-05-03 NOTE — Telephone Encounter (Signed)
Pt calling to follow up from OV 03/21/13. Pt said that she is having problems with aching joints while off the patch (climara 0.025), pt has arthritis. She has appointment with her orthopedic doctor in a couple of weeks and speak with him about this. This has happened before when patient was on patch and forgot to change it. She would like to know if you would be willing to give her a 1 month Rx until she can speak with her orthopedic doctor about her arthritis? Please advise

## 2013-05-03 NOTE — Telephone Encounter (Signed)
Left the below on pt voicemail per her request. Rx sent.

## 2013-05-04 ENCOUNTER — Ambulatory Visit (INDEPENDENT_AMBULATORY_CARE_PROVIDER_SITE_OTHER): Payer: BC Managed Care – PPO | Admitting: Physician Assistant

## 2013-05-04 VITALS — BP 142/92 | HR 72 | Temp 98.0°F | Resp 16 | Ht 66.5 in | Wt 173.0 lb

## 2013-05-04 DIAGNOSIS — J029 Acute pharyngitis, unspecified: Secondary | ICD-10-CM

## 2013-05-04 LAB — POCT CBC
Granulocyte percent: 53.2 %G (ref 37–80)
HCT, POC: 42.8 % (ref 37.7–47.9)
Hemoglobin: 13.4 g/dL (ref 12.2–16.2)
Lymph, poc: 2.9 (ref 0.6–3.4)
MCH, POC: 31.2 pg (ref 27–31.2)
MCHC: 31.3 g/dL — AB (ref 31.8–35.4)
MCV: 99.5 fL — AB (ref 80–97)
MID (cbc): 0.6 (ref 0–0.9)
MPV: 9.1 fL (ref 0–99.8)
POC Granulocyte: 3.9 (ref 2–6.9)
POC LYMPH PERCENT: 39 %L (ref 10–50)
POC MID %: 7.8 %M (ref 0–12)
Platelet Count, POC: 316 10*3/uL (ref 142–424)
RBC: 4.3 M/uL (ref 4.04–5.48)
RDW, POC: 13.6 %
WBC: 7.4 10*3/uL (ref 4.6–10.2)

## 2013-05-04 LAB — POCT RAPID STREP A (OFFICE): Rapid Strep A Screen: NEGATIVE

## 2013-05-04 MED ORDER — AMOXICILLIN 875 MG PO TABS: 875.0000 mg | ORAL_TABLET | Freq: Two times a day (BID) | ORAL | Status: DC

## 2013-05-04 NOTE — Progress Notes (Signed)
  Subjective:    Patient ID: Sharon [name], female    DOB: [birth year], 62 y.o.   MRN: [id number]  HPI 62 year old female presents with 2-3 day history of sore throat and fatigue.  States symptoms started acutely and have progressively worsened.  Today her throat is more painful and she is having painful swallowing.  Admits she would not normally come in for this but she is going to visit her son and his wife who is on chemotherapy.  She wants to make sure she does not have strep.  Denies fever, chills, nausea, vomiting, headache, otalgia, sinus pain, nasal congestion, or PND.   Patient is otherwise healthy with no other concerns today.     Review of Systems  Constitutional: Negative for fever and chills.  HENT: Positive for sore throat. Negative for ear pain, congestion, rhinorrhea, postnasal drip and ear discharge.   Respiratory: Negative for cough.   Gastrointestinal: Negative for nausea, vomiting and abdominal pain.  Neurological: Negative for headaches.       Objective:   Physical Exam  Constitutional: She is oriented to person, place, and time. She appears well-developed and well-nourished.  HENT:  Head: Normocephalic and atraumatic.  Right Ear: Hearing, tympanic membrane, external ear and ear canal normal.  Left Ear: Hearing, tympanic membrane, external ear and ear canal normal.  Mouth/Throat: Uvula is midline and mucous membranes are normal. Posterior oropharyngeal erythema present. No oropharyngeal exudate, posterior oropharyngeal edema or tonsillar abscesses.  Eyes: Conjunctivae are normal.  Neck: Normal range of motion. Neck supple.  Cardiovascular: Normal rate, regular rhythm and normal heart sounds.   Pulmonary/Chest: Effort normal and breath sounds normal.  Lymphadenopathy:    She has no cervical adenopathy.  Neurological: She is alert and oriented to person, place, and time.  Psychiatric: She has a normal mood and affect. Her behavior is normal. Judgment and thought  content normal.      Results for orders placed in visit on 05/04/13  POCT RAPID STREP A (OFFICE)      Result Value Range   Rapid Strep A Screen Negative  Negative  POCT CBC      Result Value Range   WBC 7.4  4.6 - 10.2 K/uL   Lymph, poc 2.9  0.6 - 3.4   POC LYMPH PERCENT 39.0  10 - 50 %L   MID (cbc) 0.6  0 - 0.9   POC MID % 7.8  0 - 12 %M   POC Granulocyte 3.9  2 - 6.9   Granulocyte percent 53.2  37 - 80 %G   RBC 4.30  4.04 - 5.48 M/uL   Hemoglobin 13.4  12.2 - 16.2 g/dL   HCT, POC 40.9  81.1 - 47.9 %   MCV 99.5 (*) 80 - 97 fL   MCH, POC 31.2  27 - 31.2 pg   MCHC 31.3 (*) 31.8 - 35.4 g/dL   RDW, POC 91.4     Platelet Count, POC 316  142 - 424 K/uL   MPV 9.1  0 - 99.8 fL       Assessment & Plan:  Acute pharyngitis - Plan: POCT rapid strep A, POCT CBC, Culture, Group A Strep, amoxicillin (AMOXIL) 875 MG tablet Throat culture pending. Likely viral pharyngitis. Recommend supportive measures including ibuprofen prn pain and increasing rest and fluids If no improvement in 48-72 hours, may fill amoxicillin.

## 2013-05-07 LAB — CULTURE, GROUP A STREP: Organism ID, Bacteria: NORMAL

## 2013-05-12 ENCOUNTER — Encounter: Payer: BC Managed Care – PPO | Admitting: Genetic Counselor

## 2013-05-12 ENCOUNTER — Other Ambulatory Visit: Payer: BC Managed Care – PPO | Admitting: Lab

## 2013-05-19 ENCOUNTER — Encounter: Payer: Self-pay | Admitting: Gynecology

## 2013-05-23 ENCOUNTER — Ambulatory Visit (INDEPENDENT_AMBULATORY_CARE_PROVIDER_SITE_OTHER): Payer: BC Managed Care – PPO | Admitting: Gynecology

## 2013-05-23 ENCOUNTER — Encounter: Payer: Self-pay | Admitting: Gynecology

## 2013-05-23 VITALS — BP 110/80 | HR 74 | Resp 18 | Ht 66.0 in | Wt 175.0 lb

## 2013-05-23 DIAGNOSIS — Z8041 Family history of malignant neoplasm of ovary: Secondary | ICD-10-CM

## 2013-05-23 DIAGNOSIS — Z800 Family history of malignant neoplasm of digestive organs: Secondary | ICD-10-CM

## 2013-05-23 DIAGNOSIS — N951 Menopausal and female climacteric states: Secondary | ICD-10-CM

## 2013-05-23 MED ORDER — VENLAFAXINE HCL ER 37.5 MG PO CP24: 37.5000 mg | ORAL_CAPSULE | Freq: Every day | ORAL | Status: DC

## 2013-05-23 NOTE — Progress Notes (Signed)
62 y.o. Married Caucasian female   G2P2002 here for second opinion.   Pt reports her mother and maternal grandmother both ovarian cancer treated at [facility] in [place].  Pt had slides reviewed locally as well.  As a result, pt had had a TAH-BSO in 1990.  Pt later had a maternal cousin die of ovarian and maternal aunt had colon cancer.  A maternal cousin who did not have cancer tested negative for BRCA.  Pt has never been tested.  Pt wonders if she needs testing, she has 2 sons and no grandchildren. Pt is here for a consult regarding genetic testing and hormone replacement.  She was recently taken off climara due to her family history.  Since stopping she had noticed an increase in joint pain and has had mild hot flashes but has been tolerable. 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  She also wonders if there is any reason for her to get tested. We had a long discussion regarding her family history and her personal risks.  We reviewed the pathophysiology of ovarian cancers, the fact that she had a hysterectomy with BSO decreases her risk but does not eliminate her risk of pelvic cancer. We discussed the impact of both a positive and negative test on her as well as her children and their children.  If the patient were to test positive for BRCA she may consider increasing breast imaging to include an MRI, or consider surgical intervention.  In addition she may want to be periodically checked for signs of peritoneal cancer, and may increase surveillance for colon cancer In addition, her sons could be screened for their risk of either female breast cancer, increased risk of prostate cancer and risk of passing onto their offspring. We also discussed alternative non-hormonal treatments for menopausal symtoms, such as effexor or neurotin both of which may help her symptoms of osteoarthritis.  Mechanisms of action were reviewed for both, and she would like to try  effexor, she was instucted to start at the low dose 76f 37.5 and to double after 2w if needed and to call for refill. She is agreeable. BRCA testing was done today as well  Length of consultation, >50% face to face 33m discussing genetic predisposition to cncer based on family history     Patient's last menstrual period was 02/17/1989.          Sexually active: yes  The current method of family planning is status post hysterectomy.    Exercising: yes  water walking, neigborhood strolling, water aerobics 1x/wk Last pap: 2013- Normal  Abnormal PAP: no Mammogram: 05/19/13  BSE: not as much Colonoscopy: less than 4 years  DEXA: 2-3 years ago Alcohol: no  Tobacco: 2-3 drinks/wk  Hgb: PCP  ; Urine: PCP Health Maintenance  Topic Date Due  . Tetanus/tdap  09/21/1969  . Colonoscopy  09/21/2000  . Zostavax  09/21/2010  . Pap Smear  01/27/2013  . Influenza Vaccine  06/20/2013  . Mammogram  05/20/2015    Family History  Problem Relation Age of Onset  . Ovarian cancer Mother   . Cancer Mother     OVARIAN  . Colon cancer Maternal Aunt   . Diabetes Paternal Aunt   . Ovarian cancer Maternal Grandmother   . Cancer Maternal Grandmother     OVARIAN AND UTERINE  . Heart disease Maternal Grandfather   . Hypertension Paternal Grandmother   . Ovarian cancer Cousin   .  Cancer Cousin     OVARIAN    Patient Active Problem List   Diagnosis Date Noted  . Gait abnormality 12/22/2012  . Swelling of ankle 12/22/2012  . Ringworm   . Hypertension   . Fibroid   . Headache   . Arthritis   . Vitamin d deficiency     Past Medical History  Diagnosis Date  . Hypertension   . Fibroid   . Headache(784.0)   . Arthritis     Osteoarthritis  . Vitamin D deficiency   . Ringworm     Past Surgical History  Procedure Laterality Date  . Abdominal hysterectomy  1990    TAH BSO  . Appendectomy    . Tonsillectomy    . Foot surgery    . Knee surgery      arthroscopic L knee  . Precancerous  lesion excised    . Oophorectomy      BSO    Allergies: Adhesive; Ceftin; Cefuroxime axetil; and Codeine  Current Outpatient Prescriptions  Medication Sig Dispense Refill  . amoxicillin (AMOXIL) 875 MG tablet Take 1 tablet (875 mg total) by mouth 2 (two) times daily.  20 tablet  0  . Cholecalciferol (VITAMIN D PO) Take 2,000 Units by mouth.      Marland Kitchen CLIMARA 0.025 MG/24HR apply 1 patch ONTO THE SKIN every week  4 patch  0  . Fluticasone Propionate (FLONASE NA) Place into the nose.      Marland Kitchen HYDROCHLOROTHIAZIDE PO Take 25 mg by mouth daily.       . Multiple Vitamin (MULTIVITAMIN) tablet Take 1 tablet by mouth daily.      . SUMAtriptan Succinate (IMITREX PO) Take by mouth as needed.       . verapamil (COVERA HS) 240 MG (CO) 24 hr tablet Take 240 mg by mouth 2 (two) times daily.        No current facility-administered medications for this visit.    ROS: Pertinent items are noted in HPI.  Exam:    Ht 5\' 6"  (1.676 m)  Wt 175 lb (79.379 kg)  BMI 28.26 kg/m2  LMP 02/17/1989 Weight change: @WEIGHTCHANGE @ Last 3 height recordings:  Ht Readings from Last 3 Encounters:  05/23/13 5\' 6"  (1.676 m)  05/04/13 5' 6.5" (1.689 m)  03/21/13 5' 6.25" (1.683 m)   General appearance: alert, cooperative and appears stated age

## 2013-05-24 ENCOUNTER — Other Ambulatory Visit: Payer: Self-pay | Admitting: *Deleted

## 2013-05-27 ENCOUNTER — Telehealth: Payer: Self-pay | Admitting: Gynecology

## 2013-05-27 NOTE — Telephone Encounter (Signed)
Patient needs to discuss Medicine issues

## 2013-05-27 NOTE — Telephone Encounter (Signed)
Patient seen in our office for 2nd opinion with Dr. Farrel Gobble on 05/23/2013. Patient call today of concern of Rx given for Effexor for help with joint pain and problem of hot flashes. Patient noted when picking up Rx and reading concerns and side effects if on other medications . States currently she is on  NSAIDS of Advil for knee pain and osteoarthritis, she also is currently takes Excedrin 2 x week as needed for headaches and occasionally has to use Imitrex when needed. She read in the Rx handout that these medications were contraindicated with use of Effexor. 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 Patient stated she was on Climara lowest dose  Patch  1 x week and did well on .  Pharmacy is [facility] [address] [place], [place] May leave message on CB# if not available.

## 2013-05-30 NOTE — Telephone Encounter (Signed)
Per verbal information from Dr. Farrel Gobble. Patient  Pharmacy called to confirm  And inform of patient concern of medication Effexor and her current use of NSAIDS of ADVIL. Pharmacist at  [facility] was aware also of patient concern she had spoke to patient last week about this and thought she had given reassurance to patient of being ok to use Rx Effexor. Patient notified that she would be OK to use Rx Effexor. Suggested she not use NSAIDS if she was concerned during the time frame of taking Effexor to avoid any GI upset. Reminded patient to always have food intake when taking medications.  Patient notified of estrogen Rx Climara would be  Addressed when lab test are reviewed. Patient appreciative of this information and will begin Effexor Rx.

## 2013-06-17 ENCOUNTER — Telehealth: Payer: Self-pay | Admitting: *Deleted

## 2013-06-17 NOTE — Telephone Encounter (Signed)
Left Message To Call Back  

## 2013-06-17 NOTE — Telephone Encounter (Signed)
Message copied by Lorraine Lax on Fri Jun 17, 2013  4:07 PM ------      Message from: Douglass Rivers      Created: Fri Jun 17, 2013  3:07 PM       Please see how pt did this first week of school, BRCA testing was negative, if she feels strongly, we can restart estrogen, be aware it does not cover all hereditary cancers but she should feel good about this result ------

## 2013-06-21 NOTE — Telephone Encounter (Signed)
Left Message To Call Back  

## 2013-06-21 NOTE — Telephone Encounter (Signed)
S/W patient she says school is going good she has a delightful class which she is happy about. Aware that BRCA testing is negative patient would like to stay on the Effexor for now to see how it goes. She wanted to know if the Effexor would take away the hotflashes or just take the edge off. I told the patient that that's what Dr. Farrel Gobble primarily prescribes effexor for and that it will take her body some time to get used to it. Patient says she will stay on effexor for  if she doesn't see any resolve she will call us back. Patient went on to say how appreciative she is of Dr.Lathrop on taking the time to help her resolve this issue.

## 2013-07-11 ENCOUNTER — Telehealth: Payer: Self-pay | Admitting: Gynecology

## 2013-07-11 DIAGNOSIS — N951 Menopausal and female climacteric states: Secondary | ICD-10-CM

## 2013-07-11 NOTE — Telephone Encounter (Signed)
Patient need refill of effexor. Patient knows she will more than likely need an appt as well to speak with lathrop about how the medication is doing. Patient only has one day left of the medication.  [facility] at [address]  Can be reached at the number unilt 11:30 then from 12:30 to 3

## 2013-07-11 NOTE — Telephone Encounter (Signed)
Dr. Farrel Gobble, okay to place refill?  Patient is calling back today to schedule OV as she was at work when I called her back. She is doing well on Effexor.

## 2013-07-12 NOTE — Telephone Encounter (Signed)
Please confirm dose, did she increase to 75?  If so ok to refill 75mg  #90,

## 2013-07-12 NOTE — Telephone Encounter (Signed)
Message left to return call to nurse at [phone].   

## 2013-07-13 MED ORDER — VENLAFAXINE HCL ER 37.5 MG PO CP24: 37.5000 mg | ORAL_CAPSULE | Freq: Every day | ORAL | Status: DC

## 2013-07-13 NOTE — Telephone Encounter (Signed)
Spoke with patient. She continues on 37.5 mg and states she is feeling good. Will place refill for the 37.5 mg.

## 2013-07-25 ENCOUNTER — Ambulatory Visit (INDEPENDENT_AMBULATORY_CARE_PROVIDER_SITE_OTHER): Payer: BC Managed Care – PPO | Admitting: Gynecology

## 2013-07-25 ENCOUNTER — Encounter: Payer: Self-pay | Admitting: Gynecology

## 2013-07-25 VITALS — BP 130/80 | HR 60 | Resp 12 | Ht 66.0 in | Wt 174.0 lb

## 2013-07-25 DIAGNOSIS — Z800 Family history of malignant neoplasm of digestive organs: Secondary | ICD-10-CM

## 2013-07-25 DIAGNOSIS — Z8041 Family history of malignant neoplasm of ovary: Secondary | ICD-10-CM

## 2013-07-25 DIAGNOSIS — N951 Menopausal and female climacteric states: Secondary | ICD-10-CM

## 2013-07-25 MED ORDER — VENLAFAXINE HCL ER 37.5 MG PO CP24: 37.5000 mg | ORAL_CAPSULE | Freq: Every day | ORAL | Status: DC

## 2013-07-25 NOTE — Progress Notes (Signed)
Pt returns today to follow up new start medication.  She had elected to stop her estrogen and was started on effexor for treatment of menopausal symptoms.  Pt has a family history suggestive of hereditary cancer.  We had tested her for BRCA 1/2 at her last visit and her results were negative.  SHe however elected to stay on the effexor instead of returning to estrogen.  Pt is very happy on the effexor and never increased her dose from the initial 37.5mg .  Pt reports perhaps 1 hot flash/d which is very short lasting, no night sweats, irritability or moodiness.  Pt feels very even keeled on this dose and would like to continue. We discussed the impact of her negative testing, she will send it into her family genetic profile, we discussed that there are other genes that may be the causitive agent and that there are new gene mutations that get discovered, so that she should be aware that retesting in the future is possible and she should continue to do her breast exams. And she is agreeable. We will continue the effexor for at least the year and can reassess at her annual which is due in June, refills for the remaining time were sent in. Questions addressed.  Nursing notes reviewed. BP 130/80  Pulse 60  Resp 12  Ht 5\' 6"  (1.676 m)  Wt 174 lb (78.926 kg)  BMI 28.1 kg/m2  LMP 02/17/1989 General appearance: alert, cooperative and appears stated age Neurologic: Grossly normal, alert and oriented x3  Length of time spent discussing menopausal symptoms and hereditary cancer 35m

## 2013-10-11 ENCOUNTER — Telehealth: Payer: Self-pay | Admitting: Gynecology

## 2013-10-11 DIAGNOSIS — N951 Menopausal and female climacteric states: Secondary | ICD-10-CM

## 2013-10-11 NOTE — Telephone Encounter (Signed)
Message left to return call to Yunus Stoklosa at [phone].    

## 2013-10-11 NOTE — Telephone Encounter (Signed)
Pt says she has no refills on the effexor and wondering if Dr Farrel Gobble still want her to continue with it.

## 2013-10-12 MED ORDER — VENLAFAXINE HCL ER 37.5 MG PO CP24: 37.5000 mg | ORAL_CAPSULE | Freq: Every day | ORAL | Status: DC

## 2013-10-12 NOTE — Telephone Encounter (Signed)
Great, ok to refill

## 2013-10-12 NOTE — Telephone Encounter (Signed)
Patient is returning a call to Tracy. °

## 2013-10-12 NOTE — Telephone Encounter (Signed)
Patient states she is feeling "great!" on effexor 37.5 mg 24 hr extended release. Would like refill placed.

## 2013-11-20 HISTORY — PX: KNEE ARTHROSCOPY: SHX127

## 2014-04-14 ENCOUNTER — Encounter: Payer: Self-pay | Admitting: Gynecology

## 2014-04-14 ENCOUNTER — Ambulatory Visit (INDEPENDENT_AMBULATORY_CARE_PROVIDER_SITE_OTHER): Payer: BC Managed Care – PPO | Admitting: Gynecology

## 2014-04-14 VITALS — BP 128/72 | Resp 14 | Ht 66.25 in | Wt 178.0 lb

## 2014-04-14 DIAGNOSIS — E559 Vitamin D deficiency, unspecified: Secondary | ICD-10-CM

## 2014-04-14 DIAGNOSIS — Z0000 Encounter for general adult medical examination without abnormal findings: Secondary | ICD-10-CM

## 2014-04-14 DIAGNOSIS — Z01419 Encounter for gynecological examination (general) (routine) without abnormal findings: Secondary | ICD-10-CM

## 2014-04-14 DIAGNOSIS — Z8041 Family history of malignant neoplasm of ovary: Secondary | ICD-10-CM

## 2014-04-14 DIAGNOSIS — Z800 Family history of malignant neoplasm of digestive organs: Secondary | ICD-10-CM

## 2014-04-14 DIAGNOSIS — N951 Menopausal and female climacteric states: Secondary | ICD-10-CM

## 2014-04-14 LAB — HEMOGLOBIN, FINGERSTICK: HEMOGLOBIN, FINGERSTICK: 14.4 g/dL (ref 12.0–16.0)

## 2014-04-14 LAB — POCT URINALYSIS DIPSTICK
Leukocytes, UA: NEGATIVE
PH UA: 5
UROBILINOGEN UA: NEGATIVE

## 2014-04-14 MED ORDER — VENLAFAXINE HCL ER 75 MG PO CP24: 75.0000 mg | ORAL_CAPSULE | Freq: Every day | ORAL | Status: DC

## 2014-04-14 MED ORDER — VITAMIN D (ERGOCALCIFEROL) 1.25 MG (50000 UNIT) PO CAPS: 50000.0000 [IU] | ORAL_CAPSULE | ORAL | Status: DC

## 2014-04-14 NOTE — Progress Notes (Signed)
63 y.o. Married Caucasian female   G2P2002 here for annual exam. Pt reports menses are absent due to Hysterectomy. She does not report post-menopasual bleeding.  Pt had negative BRCA 1/2 testing last year for family history of ovarian cancer.  Pt was started on effexor after stopping her estrogen.    Patient's last menstrual period was 02/17/1989.          Sexually active: Yes.    The current method of family planning is status post hysterectomy.    Exercising: Yes.    Water aerobics 3x/wk, walking  Last pap: 2013 Normal Abnormal PAP: no  Mammogram: 05/19/13 BSE: no Colonoscopy: 10/24/2008 not sure if she had polyps  f/u in 10 years  DEXA: 03/21/2013 Alcohol: 2 drinks/wk Tobacco: no  Hgb: 14.4   ; Urine: Negative    Health Maintenance  Topic Date Due  . Tetanus/tdap  09/21/1969  . Colonoscopy  09/21/2000  . Zostavax  09/21/2010  . Pap Smear  01/27/2013  . Influenza Vaccine  05/20/2014  . Mammogram  05/20/2015    Family History  Problem Relation Age of Onset  . Ovarian cancer Mother   . Cancer Mother     OVARIAN  . Colon cancer Maternal Aunt   . Diabetes Paternal Aunt   . Ovarian cancer Maternal Grandmother   . Cancer Maternal Grandmother     OVARIAN AND UTERINE  . Heart disease Maternal Grandfather   . Hypertension Paternal Grandmother   . Ovarian cancer Cousin   . Cancer Cousin     OVARIAN    Patient Active Problem List   Diagnosis Date Noted  . Gait abnormality 12/22/2012  . Swelling of ankle 12/22/2012  . Ringworm   . Hypertension   . Fibroid   . Headache   . Arthritis   . Vitamin d deficiency     Past Medical History  Diagnosis Date  . Hypertension   . Fibroid   . Headache(784.0)   . Arthritis     Osteoarthritis  . Vitamin D deficiency   . Ringworm     Past Surgical History  Procedure Laterality Date  . Abdominal hysterectomy  1990    TAH BSO  . Appendectomy    . Tonsillectomy    . Foot surgery    . Knee surgery      arthroscopic L knee  .  Precancerous lesion excised    . Oophorectomy      BSO    Allergies: Adhesive; Cefuroxime axetil; Codeine; and Ceftin  Current Outpatient Prescriptions  Medication Sig Dispense Refill  . amoxicillin (AMOXIL) 875 MG tablet Take 1 tablet (875 mg total) by mouth 2 (two) times daily.  20 tablet  0  . Cholecalciferol (VITAMIN D PO) Take 2,000 Units by mouth.      . Fluticasone Propionate (FLONASE NA) Place into the nose.      Marland Kitchen HYDROCHLOROTHIAZIDE PO Take 25 mg by mouth daily.       . Multiple Vitamin (MULTIVITAMIN) tablet Take 1 tablet by mouth daily.      . SUMAtriptan Succinate (IMITREX PO) Take by mouth as needed.       . venlafaxine XR (EFFEXOR XR) 37.5 MG 24 hr capsule Take 1 capsule (37.5 mg total) by mouth daily.  90 capsule  2  . verapamil (COVERA HS) 240 MG (CO) 24 hr tablet Take 240 mg by mouth 2 (two) times daily.        No current facility-administered medications for this visit.  ROS: Pertinent items are noted in HPI.  Exam:    BP 128/72  Resp 14  Ht 5' 6.25" (1.683 m)  Wt 178 lb (80.74 kg)  BMI 28.50 kg/m2  LMP 02/17/1989 Weight change: '@WEIGHTCHANGE' @ Last 3 height recordings:  Ht Readings from Last 3 Encounters:  04/14/14 5' 6.25" (1.683 m)  07/25/13 '5\' 6"'  (1.676 m)  05/23/13 '5\' 6"'  (1.676 m)   General appearance: alert, cooperative and appears stated age Head: Normocephalic, without obvious abnormality, atraumatic Neck: no adenopathy, no carotid bruit, no JVD, supple, symmetrical, trachea midline and thyroid not enlarged, symmetric, no tenderness/mass/nodules Lungs: clear to auscultation bilaterally Breasts: normal appearance, no masses or tenderness Heart: regular rate and rhythm, S1, S2 normal, no murmur, click, rub or gallop Abdomen: soft, non-tender; bowel sounds normal; no masses,  no organomegaly Extremities: extremities normal, atraumatic, no cyanosis or edema Skin: Skin color, texture, turgor normal. No rashes or lesions Lymph nodes: Cervical,  supraclavicular, and axillary nodes normal. no inguinal nodes palpated Neurologic: Grossly normal   Pelvic: External genitalia:  no lesions              Urethra: normal appearing urethra with no masses, tenderness or lesions              Bartholins and Skenes: normal                 Vagina: atrophic              Cervix: absent              Pap taken: No.        Bimanual Exam:  Uterus:  absent                                      Adnexa:    surgically absent bilateral                                      Rectovaginal: Confirms                                      Anus:  normal sphincter tone, no lesions    1. Laboratory examination ordered as part of a routine general medical examination  - POCT Urinalysis Dipstick - Hemoglobin, fingerstick  2. Routine gynecological examination  counseled on breast self exam, mammography screening, menopause, adequate intake of calcium and vitamin D, diet and exercise return annually or prn  3. Family history of ovarian cancer Pt s/p TAH-BSO, reviewed decreased risk of ovarian cancer but not eliminated.  Although neg testing BRCA cannot confirm no risk as mother and grandmother not ever tested, discussed increasing surveillance on breast, will continue 3D mammograms, BSE .    4. Family history of colon cancer Recommend colon cancer screening at closer interval, pt will contact Dr Deatra Ina re f/u, would be due this year  5. Menopausal symptom Still having a few hot flashes would like to try higher dose, interaction with imitrex noted, pt takes rarely, uses otc excedrin for headaches, if prefers higher dose, will call for #90 - venlafaxine XR (EFFEXOR XR) 75 MG 24 hr capsule; Take 1 capsule (75 mg total) by mouth daily.  Dispense: 30 capsule; Refill: 12  6. Vitamin  D deficiency Last value 2- was on 50,000, will reorder higher dose - Vit D  25 hydroxy (rtn osteoporosis monitoring) - Vitamin D, Ergocalciferol, (DRISDOL) 50000 UNITS CAPS capsule;  Take 1 capsule (50,000 Units total) by mouth every 7 (seven) days.  Dispense: 30 capsule; Refill: 1   An After Visit Summary was printed and given to the patient.

## 2014-04-15 LAB — VITAMIN D 25 HYDROXY (VIT D DEFICIENCY, FRACTURES): Vit D, 25-Hydroxy: 33 ng/mL (ref 30–89)

## 2014-05-12 ENCOUNTER — Telehealth: Payer: Self-pay | Admitting: Gastroenterology

## 2014-05-12 NOTE — Telephone Encounter (Signed)
Patient had a colonoscopy in 10/2008 that showed only diverticulosis.  She also had a history of ovarian cancer.  Her GYN wanted her to confirm recall is still due 10/2018.  She reports her GYN felt strongly 10 years was too long to wait.  Please advise

## 2014-05-15 ENCOUNTER — Telehealth: Payer: Self-pay | Admitting: Gynecology

## 2014-05-15 ENCOUNTER — Encounter: Payer: Self-pay | Admitting: Gastroenterology

## 2014-05-15 DIAGNOSIS — N951 Menopausal and female climacteric states: Secondary | ICD-10-CM

## 2014-05-15 NOTE — Telephone Encounter (Signed)
Patient started on effexor after stopping HRT. Patient has had a hysterectomy. Was having increased hotflashes. Effexor dose was increased to 75mg  per day on 6/26. Patient calling asking to switch rx's as she states that the Effexor is not working for her.  Routing to [place] for review and advise as Dr.Lathrop is out of the office.

## 2014-05-15 NOTE — Telephone Encounter (Signed)
I recommend a follow up visit with Dr. Charlies Constable to discuss options.

## 2014-05-15 NOTE — Telephone Encounter (Signed)
Okay, change it to 5 year recall

## 2014-05-15 NOTE — Telephone Encounter (Signed)
Patient would like to change Effexor prescription. Patient say it is not working for her.

## 2014-05-16 NOTE — Telephone Encounter (Signed)
Spoke with patient. Patient states that she was doing well on the lower dose of Effexor but was still having some hot flashes occasionally. Patient states that since she has started to increased dose she is having more hot flashes than before and is sweating all the time. Advised of message as seen below from [place]. Patient is requesting to go back on lower dose of Effexor first as she states that she was doing a lot better before she made the change to the increased dosage. Advised would send a message over to Dr.Lathrop regarding this request and would give patient a call back with further recommendations. Patient agreeable.

## 2014-05-16 NOTE — Telephone Encounter (Signed)
Left message to call [place] at [phone].  Offer appointment with Dr.Lathrop August 4th.

## 2014-05-17 MED ORDER — VENLAFAXINE HCL ER 37.5 MG PO CP24: 37.5000 mg | ORAL_CAPSULE | Freq: Every day | ORAL | Status: DC

## 2014-05-17 NOTE — Telephone Encounter (Signed)
i will send in rx for 37.5 but i think i'd still like to f/u with her regarding her issues, may want to move appt back 1-2w so she can reassess how she is on the lower dose

## 2014-05-18 NOTE — Telephone Encounter (Signed)
Patient returning Kaitlyn's call. °

## 2014-05-18 NOTE — Telephone Encounter (Signed)
Left message to call Kaitlyn at [phone]. 

## 2014-05-19 NOTE — Telephone Encounter (Signed)
Spoke with patient. Advised of message as seen below from Dr.Lathrop. Patient is agreeable and verbalizes understanding. 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 Patient would like Dr.Lathrop to know what she is scheduled to have a 5 year colonoscopy instead of 10 yeaer with Dr.Kaplan due to family history. Colonoscopy is scheduled for October.  Routing to provider for final review. Patient agreeable to disposition. Will close encounter

## 2014-06-28 ENCOUNTER — Ambulatory Visit (INDEPENDENT_AMBULATORY_CARE_PROVIDER_SITE_OTHER): Payer: BC Managed Care – PPO | Admitting: Gynecology

## 2014-06-28 ENCOUNTER — Encounter: Payer: Self-pay | Admitting: Gynecology

## 2014-06-28 VITALS — BP 130/88 | Resp 12 | Ht 66.25 in | Wt 181.0 lb

## 2014-06-28 DIAGNOSIS — N951 Menopausal and female climacteric states: Secondary | ICD-10-CM

## 2014-06-28 MED ORDER — VENLAFAXINE HCL ER 37.5 MG PO CP24: 37.5000 mg | ORAL_CAPSULE | Freq: Every day | ORAL | Status: DC

## 2014-07-13 ENCOUNTER — Ambulatory Visit (AMBULATORY_SURGERY_CENTER): Payer: BC Managed Care – PPO | Admitting: *Deleted

## 2014-07-13 VITALS — Ht 67.0 in | Wt 183.8 lb

## 2014-07-13 DIAGNOSIS — Z1211 Encounter for screening for malignant neoplasm of colon: Secondary | ICD-10-CM

## 2014-07-13 MED ORDER — NA SULFATE-K SULFATE-MG SULF 17.5-3.13-1.6 GM/177ML PO SOLN: 1.0000 | Freq: Once | ORAL | Status: DC

## 2014-07-13 NOTE — Progress Notes (Signed)
Some past nausea with sedation.ewm No 02 use at home. ewm No egg or soy allergy. ewm No diet pills. ewm

## 2014-07-19 NOTE — Progress Notes (Signed)
Pt here to discuss her effexor. Pt had chosen to stop her estrogen and start on effoexor due to strong family history of gyn cancers. Pt started at 37.5mg  and increased to 75mg  and now reports that she would like to try to go back to 37.5mg .  Pt reports feeling better on the lower dose. She tried to stop but was not comfortable and restarted. Advised that if she stops would need to taper back to the 37.5. Refill for the lower dose was given Questions addressed 60m spent counseling, >50% face to face

## 2014-07-21 ENCOUNTER — Encounter: Payer: Self-pay | Admitting: Gastroenterology

## 2014-07-21 ENCOUNTER — Ambulatory Visit (AMBULATORY_SURGERY_CENTER): Payer: BC Managed Care – PPO | Admitting: Gastroenterology

## 2014-07-21 VITALS — BP 142/77 | HR 56 | Temp 96.2°F | Resp 24

## 2014-07-21 DIAGNOSIS — K5730 Diverticulosis of large intestine without perforation or abscess without bleeding: Secondary | ICD-10-CM

## 2014-07-21 DIAGNOSIS — Z1211 Encounter for screening for malignant neoplasm of colon: Secondary | ICD-10-CM

## 2014-07-21 MED ORDER — SODIUM CHLORIDE 0.9 % IV SOLN: 500.0000 mL | INTRAVENOUS | Status: DC

## 2014-07-21 NOTE — Op Note (Signed)
[facility]  [address] [place], [id number]   COLONOSCOPY PROCEDURE REPORT  PATIENT: Sharon [name], Sharon [name]  MR#: [id number] BIRTHDATE: 05/27/1951 , 12  yrs. old GENDER: female ENDOSCOPIST: Inda Castle, MD REFERRED AQ:[id number] Nyoka Cowden, M.D. PROCEDURE DATE:  07/21/2014 PROCEDURE:   Colonoscopy, diagnostic First Screening Colonoscopy - Avg.  risk and is 50 yrs.  old or older - No.  Prior Negative Screening - Now for repeat screening. N/A  History of Adenoma - Now for follow-up colonoscopy & has been > or = to 3 yrs.  Yes hx of adenoma.  Has been 3 or more years since last colonoscopy.  Polyps Removed Today? No.  Recommend repeat exam, <10 yrs? No. ASA CLASS:   Class II INDICATIONS:hi   average risk (familiy history of ovarian cancer) MEDICATIONS: Monitored anesthesia care and Propofol 200 mg IV  DESCRIPTION OF PROCEDURE:   After the risks benefits and alternatives of the procedure were thoroughly explained, informed consent was obtained.  The digital rectal exam revealed no abnormalities of the rectum.   The LB [id number]  endoscope was introduced through the anus and advanced to the cecum, which was identified by both the appendix and ileocecal valve. No adverse events experienced.   The quality of the prep was excellent using Suprep  The instrument was then slowly withdrawn as the colon was fully examined.      COLON FINDINGS: There was mild diverticulosis noted in the  sigmoid colon.   The examination was otherwise normal.  Retroflexed views revealed no abnormalities. The time to cecum=5 minutes 42 seconds. Withdrawal time=6 minutes 10 seconds.  The scope was withdrawn and the procedure completed. COMPLICATIONS: There were no immediate complications.  ENDOSCOPIC IMPRESSION: 1.   Mild diverticulosis was noted in the descending colon and sigmoid colon 2.   The examination was otherwise normal  RECOMMENDATIONS: 1.  Continue current colorectal  screening recommendations for "routine risk" patients with a repeat colonoscopy in 10 years. 2.  Continue current colorectal screening recommendations for "routine risk" patients with a repeat colonoscopy in 10 years.  eSigned:  Inda Castle, MD 07/21/2014 1:56 PM   cc:

## 2014-07-21 NOTE — Progress Notes (Signed)
A/ox3 pleased with MAC, report to Jane RN 

## 2014-07-21 NOTE — Patient Instructions (Signed)
YOU HAD AN ENDOSCOPIC PROCEDURE TODAY AT THE [facility]: Refer to the procedure report that was given to you for any specific questions about what was found during the examination.  If the procedure report does not answer your questions, please call your gastroenterologist to clarify.  If you requested that your care partner not be given the details of your procedure findings, then the procedure report has been included in a sealed envelope for you to review at your convenience later.  YOU SHOULD EXPECT: Some feelings of bloating in the abdomen. Passage of more gas than usual.  Walking can help get rid of the air that was put into your GI tract during the procedure and reduce the bloating. If you had a lower endoscopy (such as a colonoscopy or flexible sigmoidoscopy) you may notice spotting of blood in your stool or on the toilet paper. If you underwent a bowel prep for your procedure, then you may not have a normal bowel movement for a few days.  DIET: Your first meal following the procedure should be a light meal and then it is ok to progress to your normal diet.  A half-sandwich or bowl of soup is an example of a good first meal.  Heavy or fried foods are harder to digest and may make you feel nauseous or bloated.  Likewise meals heavy in dairy and vegetables can cause extra gas to form and this can also increase the bloating.  Drink plenty of fluids but you should avoid alcoholic beverages for 24 hours.  ACTIVITY: Your care partner should take you home directly after the procedure.  You should plan to take it easy, moving slowly for the rest of the day.  You can resume normal activity the day after the procedure however you should NOT DRIVE or use heavy machinery for 24 hours (because of the sedation medicines used during the test).    SYMPTOMS TO REPORT IMMEDIATELY: A gastroenterologist can be reached at any hour.  During normal business hours, 8:30 AM to 5:00 PM Monday through Friday,  call [phone].  After hours and on weekends, please call the GI answering service at [phone] who will take a message and have the physician on call contact you.   Following lower endoscopy (colonoscopy or flexible sigmoidoscopy):  Excessive amounts of blood in the stool  Significant tenderness or worsening of abdominal pains  Swelling of the abdomen that is new, acute  Fever of 100F or higher    FOLLOW UP: If any biopsies were taken you will be contacted by phone or by letter within the next 1-3 weeks.  Call your gastroenterologist if you have not heard about the biopsies in 3 weeks.  Our staff will call the home number listed on your records the next business day following your procedure to check on you and address any questions or concerns that you may have at that time regarding the information given to you following your procedure. This is a courtesy call and so if there is no answer at the home number and we have not heard from you through the emergency physician on call, we will assume that you have returned to your regular daily activities without incident.  SIGNATURES/CONFIDENTIALITY: You and/or your care partner have signed paperwork which will be entered into your electronic medical record.  These signatures attest to the fact that that the information above on your After Visit Summary has been reviewed and is understood.  Full responsibility of the confidentiality   of this discharge information lies with you and/or your care-partner.  Diverticulosis and high fiber diet information diet given.  Next colonoscopy 10 years-2025.

## 2014-07-24 ENCOUNTER — Telehealth: Payer: Self-pay | Admitting: *Deleted

## 2014-07-24 NOTE — Telephone Encounter (Signed)
  Follow up Call-  Call back number 07/21/2014  Post procedure Call Back phone  # [phone]  Permission to leave phone message Yes     No answer at # given.  Left message on voicemail.

## 2014-08-21 ENCOUNTER — Encounter: Payer: Self-pay | Admitting: Gastroenterology

## 2014-12-26 ENCOUNTER — Ambulatory Visit (AMBULATORY_SURGERY_CENTER): Payer: Self-pay

## 2014-12-26 ENCOUNTER — Telehealth: Payer: Self-pay | Admitting: Gastroenterology

## 2014-12-26 VITALS — Ht 66.5 in | Wt 175.0 lb

## 2014-12-26 DIAGNOSIS — K625 Hemorrhage of anus and rectum: Secondary | ICD-10-CM

## 2014-12-26 NOTE — Progress Notes (Signed)
No allergies to eggs or soy No diet/weight loss meds No home oxygen No past problems with anesthesia  Has email  Emmi instructions given for flex sig

## 2014-12-26 NOTE — Telephone Encounter (Signed)
I have spoken to patient and she states that she would be fine with Dr Olevia Perches doing her flexible sigmoidoscopy on Wednesday. She will plan to come for previsit on 12-26-14 (today) to sign paperwork and get instructions.

## 2014-12-26 NOTE — Telephone Encounter (Signed)
-----   Message from Lafayette Dragon, MD sent at 12/25/2014  6:19 PM EST ----- Yes I would be available to do the flexible sigmoidoscopy this week. ----- Message -----    From: Larina Bras, CMA    Sent: 12/25/2014   5:04 PM      To: Lafayette Dragon, MD  Dr Olevia Perches- Dr Deatra Ina received a call from Dr Zada Girt about this patient today. Dr Nyoka Cowden states that the patient is leaving to go to [place] on Friday but has been having some recurrent rectal bleeding. He wanted her to have a flexible sigmoidoscopy sometime this week (had full colon 07/2014 by Deatra Ina showing diverticulosis). Unfortunately, Dr Deatra Ina has no availabilities for procedures this week. You have 2 openings on Wednesday evening... Would you be okay with doing a flex on this patient for Dr Deatra Ina?

## 2014-12-27 ENCOUNTER — Encounter: Payer: Self-pay | Admitting: Internal Medicine

## 2014-12-27 ENCOUNTER — Ambulatory Visit (AMBULATORY_SURGERY_CENTER): Payer: Commercial Managed Care - PPO | Admitting: Internal Medicine

## 2014-12-27 VITALS — BP 136/81 | HR 63 | Temp 98.0°F | Resp 13 | Ht 66.0 in | Wt 175.0 lb

## 2014-12-27 DIAGNOSIS — K625 Hemorrhage of anus and rectum: Secondary | ICD-10-CM

## 2014-12-27 DIAGNOSIS — K649 Unspecified hemorrhoids: Secondary | ICD-10-CM

## 2014-12-27 MED ORDER — SODIUM CHLORIDE 0.9 % IV SOLN: 500.0000 mL | INTRAVENOUS | Status: DC

## 2014-12-27 MED ORDER — HYDROCORTISONE ACETATE 25 MG RE SUPP: 25.0000 mg | Freq: Every evening | RECTAL | Status: DC | PRN

## 2014-12-27 NOTE — Op Note (Signed)
[facility]  [address]. [place], [id number]   FLEXIBLE SIGMOIDOSCOPY PROCEDURE REPORT  PATIENT: Sharon, [name]  MR#: [id number] BIRTHDATE: Jan 23, 1951 , 10  yrs. old GENDER: female ENDOSCOPIST: Lafayette Dragon, MD REFERRED BY: Dr Zada Girt PROCEDURE DATE:  12/27/2014 PROCEDURE:   Sigmoidoscopy, diagnostic ASA CLASS:   Class II INDICATIONS:painless low-volume rectal bleeding of several days' duration.  Patient had a normal colonoscopy in October 2015. MEDICATIONS: Monitored anesthesia care and Propofol 120 mg IV  DESCRIPTION OF PROCEDURE:   After the risks benefits and alternatives of the procedure were thoroughly explained, informed consent was obtained.  Digital exam revealed no abnormalities of the rectum. The LB [id number]  endoscope was introduced through the anus  and advanced to the splenic flexure , The exam was Without limitations.    The quality of the prep was The overall prep quality was good. .  The instrument was then slowly withdrawn as the mucosa was fully examined.         COLON FINDINGS: Small internal Grade I hemorrhoids were found. There was no evidence of Fissure or active bleeding or thrombosis. Retroflexed views revealed internal Grade I hemorrhoids.    The scope was then withdrawn from the patient and the procedure terminated.  COMPLICATIONS: There were no immediate complications.  ENDOSCOPIC IMPRESSION: 1.   Small internal Grade I hemorrhoids 2.   There was no evidence of Fissure or active bleeding or thrombosis , there was no prolapse  RECOMMENDATIONS: suspect anal rectal source of bleeding from small internal hemorrhoids. No other lesion found between the rectum and splenic flexure., We will treat with Anusol HC suppositories at bedtime stool softeners High-fiber diet Recall colonoscopy as per Dr. Guillermina [place] recommendations  REPEAT EXAM: fyull colon 2025  eSigned:  Lafayette Dragon, MD 12/27/2014 4:19  PM   CC:  PATIENT NAME:  Sharon, [name] MR#: [id number]

## 2014-12-27 NOTE — Progress Notes (Signed)
A/ox3, pleased with MAC, report to RN 

## 2014-12-27 NOTE — Progress Notes (Signed)
Waiting for Dr. Olevia Perches to perform timeout.

## 2014-12-27 NOTE — Patient Instructions (Addendum)
YOU HAD AN ENDOSCOPIC PROCEDURE TODAY AT [facility]:   Refer to the procedure report that was given to you for any specific questions about what was found during the examination.  If the procedure report does not answer your questions, please call your gastroenterologist to clarify.  If you requested that your care partner not be given the details of your procedure findings, then the procedure report has been included in a sealed envelope for you to review at your convenience later.  YOU SHOULD EXPECT: Some feelings of bloating in the abdomen. Passage of more gas than usual.  Walking can help get rid of the air that was put into your GI tract during the procedure and reduce the bloating. If you had a lower endoscopy (such as a colonoscopy or flexible sigmoidoscopy) you may notice spotting of blood in your stool or on the toilet paper. If you underwent a bowel prep for your procedure, you may not have a normal bowel movement for a few days.  Please Note:  You might notice some irritation and congestion in your nose or some drainage.  This is from the oxygen used during your procedure.  There is no need for concern and it should clear up in a day or so.  SYMPTOMS TO REPORT IMMEDIATELY:   Following lower endoscopy (colonoscopy or flexible sigmoidoscopy):  Excessive amounts of blood in the stool  Significant tenderness or worsening of abdominal pains  Swelling of the abdomen that is new, acute  Fever of 100F or higher  For urgent or emergent issues, a gastroenterologist can be reached at any hour by calling [phone].   DIET: Your first meal following the procedure should be a small meal and then it is ok to progress to your normal diet. Heavy or fried foods are harder to digest and may make you feel nauseous or bloated.  Likewise, meals heavy in dairy and vegetables can increase bloating.  Drink plenty of fluids but you should avoid alcoholic beverages for 24  hours.  ACTIVITY:  You should plan to take it easy for the rest of today and you should NOT DRIVE or use heavy machinery until tomorrow (because of the sedation medicines used during the test).    FOLLOW UP: Our staff will call the number listed on your records the next business day following your procedure to check on you and address any questions or concerns that you may have regarding the information given to you following your procedure. If we do not reach you, we will leave a message.  However, if you are feeling well and you are not experiencing any problems, there is no need to return our call.  We will assume that you have returned to your regular daily activities without incident.  If any biopsies were taken you will be contacted by phone or by letter within the next 1-3 weeks.  Please call us at [phone] if you have not heard about the biopsies in 3 weeks.    SIGNATURES/CONFIDENTIALITY: You and/or your care partner have signed paperwork which will be entered into your electronic medical record.  These signatures attest to the fact that that the information above on your After Visit Summary has been reviewed and is understood.  Full responsibility of the confidentiality of this discharge information lies with you and/or your care-partner.  Hemorrhoids, high fiber diet-handouts given

## 2014-12-27 NOTE — Progress Notes (Signed)
Waiting on Dr. Olevia Perches

## 2014-12-28 ENCOUNTER — Telehealth: Payer: Self-pay | Admitting: *Deleted

## 2014-12-28 NOTE — Telephone Encounter (Signed)
  Follow up Call-  Call back number 12/27/2014 07/21/2014  Post procedure Call Back phone  # [phone]  Permission to leave phone message Yes Yes     Patient questions:  Do you have a fever, pain , or abdominal swelling? No. Pain Score  0 *  Have you tolerated food without any problems? Yes.    Have you been able to return to your normal activities? Yes.    Do you have any questions about your discharge instructions: Diet   No. Medications  No. Follow up visit  No.  Do you have questions or concerns about your Care? No.  Actions: * If pain score is 4 or above: No action needed, pain <4.

## 2015-02-16 ENCOUNTER — Ambulatory Visit (INDEPENDENT_AMBULATORY_CARE_PROVIDER_SITE_OTHER): Payer: Commercial Managed Care - PPO | Admitting: Family Medicine

## 2015-02-16 VITALS — BP 120/68 | HR 66 | Temp 97.8°F | Resp 16 | Ht 66.5 in | Wt 172.4 lb

## 2015-02-16 DIAGNOSIS — H9222 Otorrhagia, left ear: Secondary | ICD-10-CM

## 2015-02-16 DIAGNOSIS — S00412A Abrasion of left ear, initial encounter: Secondary | ICD-10-CM

## 2015-02-16 NOTE — Progress Notes (Signed)
[facility] [address], [place] [address] [phone]  Date:  02/16/2015   Name:  Sharon [name]   DOB:  [birth year]   MRN:  [id number]  PCP:  Criselda Peaches, MD    Chief Complaint: bleeding from ear   History of Present Illness:  Sharon [name] is a 64 y.o. very pleasant female patient who presents with the following:  Usual pt of Ed Green.  Here today with concern of bleeding from her left ear that she noticed this am.  She had used a q-tip prior to onset of bleeding but she was not aware of any trauma.  Her hearing seems to be about the same as usual.   No accident or trauma.   She is not aware of any other unusual bleeding- she did have a little blood in her stool in march and was dx with hemorrhoids. She had anoscopy  Patient Active Problem List   Diagnosis Date Noted  . Gait abnormality 12/22/2012  . Swelling of ankle 12/22/2012  . Ringworm   . Hypertension   . Fibroid   . Headache(784.0)   . Arthritis   . Vitamin D deficiency     Past Medical History  Diagnosis Date  . Hypertension   . Fibroid   . Headache(784.0)   . Arthritis     Osteoarthritis  . Vitamin D deficiency   . Ringworm     Past Surgical History  Procedure Laterality Date  . Abdominal hysterectomy  1990    TAH BSO  . Appendectomy  1969  . Tonsillectomy    . Foot surgery      bunionectomy  . Knee surgery  11/2013    arthroscopic L knee  . Precancerous lesion excised    . Oophorectomy      BSO  . Total knee arthroplasty  2012    History  Substance Use Topics  . Smoking status: Never Smoker   . Smokeless tobacco: Never Used  . Alcohol Use: 1.5 oz/week    3 drink(s) per week    Family History  Problem Relation Age of Onset  . Ovarian cancer Mother   . Cancer Mother     OVARIAN  . Colon cancer Maternal Aunt   . Diabetes Paternal Aunt   . Ovarian cancer Maternal Grandmother   . Cancer Maternal Grandmother     OVARIAN AND UTERINE  . Heart  disease Maternal Grandfather   . Hypertension Paternal Grandmother   . Ovarian cancer Cousin   . Cancer Cousin     OVARIAN    Allergies  Allergen Reactions  . Adhesive [Tape] Other (See Comments)    Blisters  . Cefuroxime Axetil     REACTION: rash  . Codeine Other (See Comments)    headache  . Ceftin Rash    Medication list has been reviewed and updated.  Current Outpatient Prescriptions on File Prior to Visit  Medication Sig Dispense Refill  . fluticasone (FLONASE) 50 MCG/ACT nasal spray Place 1 spray into both nostrils daily.    Marland Kitchen HYDROCHLOROTHIAZIDE PO Take 25 mg by mouth daily.     . SUMAtriptan Succinate (IMITREX PO) Take by mouth as needed.     . venlafaxine XR (EFFEXOR-XR) 37.5 MG 24 hr capsule Take 1 capsule (37.5 mg total) by mouth daily. 90 capsule 3  . verapamil (COVERA HS) 240 MG (CO) 24 hr tablet Take 240 mg by mouth 2 (two) times daily.     Marland Kitchen  amoxicillin (AMOXIL) 500 MG capsule Take 500 mg by mouth once. 4-500 mg capsules prior to dental procedures.    Marland Kitchen aspirin-acetaminophen-caffeine (EXCEDRIN MIGRAINE) 250-250-65 MG per tablet Take 1 tablet by mouth every 6 (six) hours as needed for headache.    . hydrocortisone (ANUSOL-HC) 25 MG suppository Place 1 suppository (25 mg total) rectally at bedtime as needed for hemorrhoids or itching. (Patient not taking: Reported on 02/16/2015) 12 suppository 1  . Vitamin D, Ergocalciferol, (DRISDOL) 50000 UNITS CAPS capsule Take 1 capsule (50,000 Units total) by mouth every 7 (seven) days. (Patient not taking: Reported on 12/27/2014) 30 capsule 1   No current facility-administered medications on file prior to visit.    Review of Systems:  As per HPI- otherwise negative.   Physical Examination: Filed Vitals:   02/16/15 1127  BP: 120/68  Pulse: 66  Temp: 97.8 F (36.6 C)  Resp: 16   Filed Vitals:   02/16/15 1127  Height: 5' 6.5" (1.689 m)  Weight: 172 lb 6.4 oz (78.2 kg)   Body mass index is 27.41 kg/(m^2). Ideal Body  Weight: Weight in (lb) to have BMI = 25: 156.9  GEN: WDWN, NAD, Non-toxic, A & O x 3 HEENT: Atraumatic, Normocephalic. Neck supple. No masses, No LAD. Ears and Nose: No external deformity. CV: RRR, No M/G/R. No JVD. No thrill. No extra heart sounds. PULM: CTA B, no wheezes, crackles, rhonchi. No retractions. No resp. distress. No accessory muscle use. ABD: S, NT, ND, +BS. No rebound. No HSM. EXTR: No c/c/e NEURO Normal gait.  PSYCH: Normally interactive. Conversant. Not depressed or anxious appearing.  Calm demeanor.  Left ear: there is dark blood and some clot in the canal.  VC obtained. Used q-tips to remove blood and visualized tiny area of bleeding in canal likely due to trauma.  Used silver nitrate sticks to cauterize with success. Had pt sit for a few minutes to make sure area stayed cauterized.   Assessment and Plan: Bleeding from ear, left  Ear canal abrasion, left, initial encounter  Resolved bleeding as above.  She is advised to leave the area alone to avoid re-bleeding.  She will following as needed  Signed Lamar Blinks, MD

## 2015-02-16 NOTE — Patient Instructions (Signed)
We used silver nitrate sticks to cauterize the small area of bleeding in your ear canal.  If this starts to bleed again today come back  Otherwise be careful with the area so as not to disturb the clot!

## 2015-02-19 ENCOUNTER — Telehealth: Payer: Self-pay

## 2015-02-19 NOTE — Telephone Encounter (Signed)
Records faxed.

## 2015-02-19 NOTE — Telephone Encounter (Signed)
Sharon [name] from Dr Judieth Keens office states they sent pt over and would like to have the records for that visit faxed. They are her PCP Please fax to [phone] and the phone number is [phone]

## 2015-03-22 ENCOUNTER — Encounter (HOSPITAL_COMMUNITY)
Admission: RE | Admit: 2015-03-22 | Discharge: 2015-03-22 | Disposition: A | Discharge status: Home / Self Care | Payer: Commercial Managed Care - PPO | Source: Ambulatory Visit | Attending: Orthopaedic Surgery | Admitting: Orthopaedic Surgery

## 2015-03-22 ENCOUNTER — Ambulatory Visit (HOSPITAL_COMMUNITY)
Admission: RE | Admit: 2015-03-22 | Discharge: 2015-03-22 | Disposition: A | Discharge status: Home / Self Care | Payer: Commercial Managed Care - PPO | Source: Ambulatory Visit | Attending: Orthopedic Surgery | Admitting: Orthopedic Surgery

## 2015-03-22 ENCOUNTER — Other Ambulatory Visit: Payer: Self-pay

## 2015-03-22 ENCOUNTER — Encounter (HOSPITAL_COMMUNITY): Payer: Self-pay

## 2015-03-22 DIAGNOSIS — Z01818 Encounter for other preprocedural examination: Secondary | ICD-10-CM

## 2015-03-22 LAB — COMPREHENSIVE METABOLIC PANEL
ALT: 15 U/L (ref 14–54)
ANION GAP: 10 (ref 5–15)
AST: 17 U/L (ref 15–41)
Albumin: 4.3 g/dL (ref 3.5–5.0)
Alkaline Phosphatase: 74 U/L (ref 38–126)
BILIRUBIN TOTAL: 1 mg/dL (ref 0.3–1.2)
BUN: 16 mg/dL (ref 6–20)
CO2: 24 mmol/L (ref 22–32)
Calcium: 9.8 mg/dL (ref 8.9–10.3)
Chloride: 104 mmol/L (ref 101–111)
Creatinine, Ser: 0.68 mg/dL (ref 0.44–1.00)
GFR calc Af Amer: >60 mL/min (ref >60)
GLUCOSE: 92 mg/dL (ref 65–99)
Potassium: 3.6 mmol/L (ref 3.5–5.1)
SODIUM: 138 mmol/L (ref 135–145)
TOTAL PROTEIN: 7.5 g/dL (ref 6.5–8.1)

## 2015-03-22 LAB — CBC WITH DIFFERENTIAL/PLATELET
Basophils Absolute: 0.1 10*3/uL (ref 0.0–0.1)
Basophils Relative: 1 % (ref 0–1)
Eosinophils Absolute: 0.1 10*3/uL (ref 0.0–0.7)
Eosinophils Relative: 1 % (ref 0–5)
HCT: 38.7 % (ref 36.0–46.0)
Hemoglobin: 13.3 g/dL (ref 12.0–15.0)
Lymphocytes Relative: 34 % (ref 12–46)
Lymphs Abs: 2.2 10*3/uL (ref 0.7–4.0)
MCH: 31.2 pg (ref 26.0–34.0)
MCHC: 34.4 g/dL (ref 30.0–36.0)
MCV: 90.8 fL (ref 78.0–100.0)
MONO ABS: 0.5 10*3/uL (ref 0.1–1.0)
Monocytes Relative: 8 % (ref 3–12)
NEUTROS ABS: 3.6 10*3/uL (ref 1.7–7.7)
Neutrophils Relative %: 56 % (ref 43–77)
Platelets: 314 10*3/uL (ref 150–400)
RBC: 4.26 MIL/uL (ref 3.87–5.11)
RDW: 12.7 % (ref 11.5–15.5)
WBC: 6.5 10*3/uL (ref 4.0–10.5)

## 2015-03-22 LAB — URINALYSIS, ROUTINE W REFLEX MICROSCOPIC
Bilirubin Urine: NEGATIVE
GLUCOSE, UA: NEGATIVE mg/dL
Hgb urine dipstick: NEGATIVE
KETONES UR: NEGATIVE mg/dL
Nitrite: NEGATIVE
PH: 7 (ref 5.0–8.0)
PROTEIN: NEGATIVE mg/dL
SPECIFIC GRAVITY, URINE: 1.005 (ref 1.005–1.030)
UROBILINOGEN UA: 0.2 mg/dL (ref 0.0–1.0)

## 2015-03-22 LAB — SURGICAL PCR SCREEN
MRSA, PCR: NEGATIVE
Staphylococcus aureus: NEGATIVE

## 2015-03-22 LAB — TYPE AND SCREEN
ABO/RH(D): A POS
Antibody Screen: NEGATIVE

## 2015-03-22 LAB — URINE MICROSCOPIC-ADD ON

## 2015-03-22 LAB — PROTIME-INR
INR: 1.03 (ref 0.00–1.49)
Prothrombin Time: 13.8 seconds (ref 11.6–15.2)

## 2015-03-22 LAB — APTT: APTT: 40 s — AB (ref 24–37)

## 2015-03-22 NOTE — Pre-Procedure Instructions (Addendum)
       Sharon [name]  03/22/2015      [id number] - [place], [place]. [place] [address] Phone: [phone] Fax: [place] [address] - [place], [place] [address] Phone: [phone] Fax: [phone]    Your procedure is scheduled on Tuesday, June 14th.   Report to [facility] Admitting at 5:30 A.M.  Call this number if you have problems the morning of surgery:  [phone]   Remember:  Do not eat food or drink liquids(that includes water) after midnight Monday.  Take these medicines the morning of surgery with A SIP OF WATER : Effexor.  Please use Flonase that morning.             STOP taking any herbal medications or supplements 4-5 days prior to surgery.   Do not wear jewelry, make-up or nail polish.  Do not wear lotions, powders, or perfumes.  You may NOt wear deodorant the day of surgery.  Do not shave underarms & legs 48 hours prior to surgery.     Do not bring valuables to the hospital.  [facility] is not responsible for any belongings or valuables.              Contacts, dentures or bridgework may not be worn into surgery.  Leave your suitcase in the car.  After surgery it may be brought to your room.             For patients admitted to the hospital, discharge time will be determined by your treatment team.              Name and phone number of your driver:     Special instructions:  "Preparing for Surgery" instruction sheet.  Please read over the following fact sheets that you were given. Pain Booklet, Coughing and Deep Breathing, Blood Transfusion Information, MRSA Information and Surgical Site Infection Prevention

## 2015-03-22 NOTE — Progress Notes (Signed)
Denies any cardiac issues, tho she had chest tightness, Dr.E Green did stress test -- normal   PCP Dr. Grayland Ormond here in [place].

## 2015-04-02 NOTE — H&P (Signed)
CHIEF COMPLAINT:  Painful left knee.   HISTORY OF PRESENT ILLNESS:  Sharon [name] is a very pleasant 64 year old white female who is seen today for evaluation of her left knee.  We have seen her for many years in regard to her left knee.  She has had left knee pain and actually has had an arthroscopic debridement of the left knee in February 2015 which was removal of loose bodies, synovectomy, and plicectomy as well as chondroplasty.  She continues to have pain and discomfort in the knee despite conservative treatment.  We started her on anti-inflammatories as well as corticosteroid injections as well as viscosupplementation.  She finished her last series of Euflexxa in January 2016 and unfortunately continues to have pain in the left knee.  The viscosupplementation had a little bit of help but more aching at nighttime but not a fully constant pain, but it is certainly now interfering with her activities of daily living and resting at nighttime.  She feels that the knee is becoming a little more unstable, and she is afraid of falling at times.  She has had a right total knee arthroplasty and has done exceedingly well with this in the past.  Seen today for evaluation.   PAST MEDICAL HISTORY:  In general, her health is good.   HOSPITALIZATIONS:  In 1956 for tonsillectomy, 1969 appendectomy, 1969 bunionectomy x2, 1990 hysterectomy and oophorectomy, 2012 right knee arthroscopy, 2015 left knee arthroscopy.  Childbirth in 56 and 1979.   MEDICATIONS:  Hydrochlorothiazide 25 mg daily, Effexor 37.5 mg daily, Imitrex p.r.n., verapamil b.i.d., Excedrin Extra-Strength 1 to 2 as needed for migraines.   ALLERGIES:  CEFTIN, CODEINE, EPINEPHRINE.  ADHESIVES cause rash and blistering.     FOURTEEN-POINT REVIEW OF SYSTEMS:  Positive for glasses.  She has had hypertension for the past 25 years and is on medications and controlled.  In March she did have hematochezia, and it was felt that it was probably on the basis of  hemorrhoids.  Her continued workup was negative.  She was anemic many years ago but nothing recently.  She does have headaches and migraines.  She has had some recent weight gain.   FAMILY HISTORY:  Positive for mother who died at age 62 from ovarian cancer.  Father is still alive at 65 years of age and has arthritis.  She has 3 living brothers ages 58, 20, and 51.  No sisters.   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  She denies the use of tobacco.  She does drink wine intermittently.   PHYSICAL EXAMINATION:  General:  A 64 year old white female well developed, well nourished, alert, pleasant, cooperative in moderate distress secondary to left knee pain. Vitals:  Height 5 feet 7 inches.  Weight 175 pounds.  BMI 27.4.  Temperature 98.6.  Pulse 65.  Respirations 14.  Blood pressure 135/86. Head:  Normocephalic.  Eyes:  Pupils equal, round, and reactive to light and accommodation with extraocular movements intact. Ears/Nose/Throat:  Benign. Chest:  Had good expansion. Lungs:  Essentially clear. Cardiac:  Had a regular rhythm and rate.  Normal S1, S2.  No murmurs were noted. Pulses:  Were 1+, bilateral and symmetric in the lower extremities. Abdomen:  Scaphoid, soft, nontender.  No mass palpable.  Normal bowel sounds present. CNS:  Oriented x3, and cranial nerves II through XII grossly intact. Musculoskeletal:  She has left knee range of motion from about 3 degrees to 105 degrees.  She does have some  opening with valgus stressing.  Patellofemoral crepitance is noted with tenderness along the medial and lateral joint lines.  A small effusion is also noted.   RADIOGRAPHS:  Left knee films from January 2015 reveal near bone-on-bone medial compartment OA.  There is periarticular spurring both medial and lateral compartments.  Sclerosing of the medial femoral condyle and tibial plateau is noted.  Does have some patellofemoral sclerosing also noted.     CLINICAL IMPRESSION:   1.  End-stage OA, left knee. 2.  Hypertension.   At this time I reviewed a form from Dr. Zada Girt stating that he felt she was a candidate from medical clearance for a total knee replacement.   RECOMMENDATIONS:  At this time we will plan on proceeding with a left total knee arthroplasty.  Procedure, risks, and benefits were explained in detail.  Prosthesis was also demonstrated to the patient, and she is understanding.  Our plan is to proceed with a left total knee arthroplasty in the very near future.  She is in agreement with this.   Mike Craze [place], [place] [phone]  04/02/2015 7:00 PM

## 2015-04-03 ENCOUNTER — Encounter (HOSPITAL_COMMUNITY)
Admission: RE | Disposition: A | Discharge status: Home Health | Payer: Self-pay | Source: Ambulatory Visit | Attending: Orthopaedic Surgery

## 2015-04-03 ENCOUNTER — Inpatient Hospital Stay (HOSPITAL_COMMUNITY): Payer: Commercial Managed Care - PPO | Admitting: Certified Registered Nurse Anesthetist

## 2015-04-03 ENCOUNTER — Encounter (HOSPITAL_COMMUNITY): Payer: Self-pay | Admitting: Certified Registered Nurse Anesthetist

## 2015-04-03 ENCOUNTER — Inpatient Hospital Stay (HOSPITAL_COMMUNITY): Payer: Commercial Managed Care - PPO | Admitting: Emergency Medicine

## 2015-04-03 ENCOUNTER — Inpatient Hospital Stay (HOSPITAL_COMMUNITY)
Admission: RE | Admit: 2015-04-03 | Discharge: 2015-04-05 | DRG: 470 | Disposition: A | Discharge status: Home Health | Payer: Commercial Managed Care - PPO | Source: Ambulatory Visit | Attending: Orthopaedic Surgery | Admitting: Orthopaedic Surgery

## 2015-04-03 DIAGNOSIS — M1712 Unilateral primary osteoarthritis, left knee: Secondary | ICD-10-CM | POA: Diagnosis present

## 2015-04-03 DIAGNOSIS — Z96652 Presence of left artificial knee joint: Secondary | ICD-10-CM

## 2015-04-03 DIAGNOSIS — Z96659 Presence of unspecified artificial knee joint: Secondary | ICD-10-CM

## 2015-04-03 DIAGNOSIS — M25562 Pain in left knee: Secondary | ICD-10-CM | POA: Diagnosis present

## 2015-04-03 DIAGNOSIS — Z79899 Other long term (current) drug therapy: Secondary | ICD-10-CM

## 2015-04-03 DIAGNOSIS — I10 Essential (primary) hypertension: Secondary | ICD-10-CM | POA: Diagnosis present

## 2015-04-03 HISTORY — PX: TOTAL KNEE ARTHROPLASTY: SHX125

## 2015-04-03 SURGERY — ARTHROPLASTY, KNEE, TOTAL
Anesthesia: Regional | Site: Knee | Laterality: Left

## 2015-04-03 MED ORDER — BISACODYL 10 MG RE SUPP: 10.0000 mg | Freq: Every day | RECTAL | Status: DC | PRN

## 2015-04-03 MED ORDER — CHLORHEXIDINE GLUCONATE 4 % EX LIQD: 60.0000 mL | Freq: Once | CUTANEOUS | Status: DC

## 2015-04-03 MED ORDER — VANCOMYCIN HCL IN DEXTROSE 1-5 GM/200ML-% IV SOLN
1000.0000 mg | Freq: Two times a day (BID) | INTRAVENOUS | Status: AC
  Administered 2015-04-03 – 2015-04-04 (×2): 1000 mg via INTRAVENOUS
  Filled 2015-04-03 (×3): qty 200

## 2015-04-03 MED ORDER — DEXAMETHASONE SODIUM PHOSPHATE 4 MG/ML IJ SOLN
INTRAMUSCULAR | Status: AC
  Filled 2015-04-03: qty 1

## 2015-04-03 MED ORDER — PHENYLEPHRINE 40 MCG/ML (10ML) SYRINGE FOR IV PUSH (FOR BLOOD PRESSURE SUPPORT)
PREFILLED_SYRINGE | INTRAVENOUS | Status: AC
  Filled 2015-04-03: qty 20

## 2015-04-03 MED ORDER — BUPIVACAINE LIPOSOME 1.3 % IJ SUSP
20.0000 mL | INTRAMUSCULAR | Status: DC
  Filled 2015-04-03: qty 20

## 2015-04-03 MED ORDER — ACETAMINOPHEN 10 MG/ML IV SOLN
1000.0000 mg | Freq: Four times a day (QID) | INTRAVENOUS | Status: AC
  Administered 2015-04-03 – 2015-04-04 (×4): 1000 mg via INTRAVENOUS
  Filled 2015-04-03 (×4): qty 100

## 2015-04-03 MED ORDER — VANCOMYCIN HCL IN DEXTROSE 1-5 GM/200ML-% IV SOLN
1000.0000 mg | INTRAVENOUS | Status: AC
  Administered 2015-04-03: 1000 mg via INTRAVENOUS
  Filled 2015-04-03: qty 200

## 2015-04-03 MED ORDER — DIPHENHYDRAMINE HCL 12.5 MG/5ML PO ELIX
12.5000 mg | ORAL_SOLUTION | ORAL | Status: DC | PRN
Start: 2015-04-03 — End: 2015-04-05

## 2015-04-03 MED ORDER — ONDANSETRON HCL 4 MG/2ML IJ SOLN
INTRAMUSCULAR | Status: DC | PRN
  Administered 2015-04-03: 4 mg via INTRAVENOUS

## 2015-04-03 MED ORDER — KETOROLAC TROMETHAMINE 30 MG/ML IJ SOLN
INTRAMUSCULAR | Status: AC
  Filled 2015-04-03: qty 1

## 2015-04-03 MED ORDER — OXYCODONE HCL 5 MG PO TABS
ORAL_TABLET | ORAL | Status: AC
  Filled 2015-04-03: qty 2

## 2015-04-03 MED ORDER — PROPOFOL 10 MG/ML IV BOLUS
INTRAVENOUS | Status: AC
  Filled 2015-04-03: qty 20

## 2015-04-03 MED ORDER — SODIUM CHLORIDE 0.9 % IV SOLN
INTRAVENOUS | Status: DC
  Administered 2015-04-03: 21:00:00 via INTRAVENOUS

## 2015-04-03 MED ORDER — VERAPAMIL HCL ER 240 MG PO TBCR
240.0000 mg | EXTENDED_RELEASE_TABLET | Freq: Two times a day (BID) | ORAL | Status: DC
  Administered 2015-04-03 – 2015-04-05 (×3): 240 mg via ORAL
  Filled 2015-04-03 (×7): qty 1

## 2015-04-03 MED ORDER — LIDOCAINE HCL (CARDIAC) 20 MG/ML IV SOLN
INTRAVENOUS | Status: AC
  Filled 2015-04-03: qty 5

## 2015-04-03 MED ORDER — HYDROCHLOROTHIAZIDE 25 MG PO TABS
25.0000 mg | ORAL_TABLET | Freq: Every day | ORAL | Status: DC
  Administered 2015-04-03 – 2015-04-05 (×3): 25 mg via ORAL
  Filled 2015-04-03 (×3): qty 1

## 2015-04-03 MED ORDER — HYDROMORPHONE HCL 1 MG/ML IJ SOLN
0.2500 mg | INTRAMUSCULAR | Status: DC | PRN
  Administered 2015-04-03 (×2): 0.5 mg via INTRAVENOUS

## 2015-04-03 MED ORDER — OXYCODONE HCL 5 MG PO TABS: 5.0000 mg | ORAL_TABLET | Freq: Once | ORAL | Status: DC | PRN

## 2015-04-03 MED ORDER — KETOROLAC TROMETHAMINE 15 MG/ML IJ SOLN
15.0000 mg | Freq: Four times a day (QID) | INTRAMUSCULAR | Status: AC
  Administered 2015-04-03 – 2015-04-04 (×4): 15 mg via INTRAVENOUS
  Filled 2015-04-03 (×3): qty 1

## 2015-04-03 MED ORDER — ONDANSETRON HCL 4 MG/2ML IJ SOLN
4.0000 mg | Freq: Four times a day (QID) | INTRAMUSCULAR | Status: DC | PRN
  Administered 2015-04-03 – 2015-04-04 (×2): 4 mg via INTRAVENOUS
  Filled 2015-04-03 (×2): qty 2

## 2015-04-03 MED ORDER — OXYCODONE HCL 5 MG PO TABS
5.0000 mg | ORAL_TABLET | ORAL | Status: DC | PRN
  Administered 2015-04-03 – 2015-04-05 (×7): 10 mg via ORAL
  Filled 2015-04-03 (×6): qty 2

## 2015-04-03 MED ORDER — DEXAMETHASONE SODIUM PHOSPHATE 4 MG/ML IJ SOLN
INTRAMUSCULAR | Status: DC | PRN
  Administered 2015-04-03: 4 mg via INTRAVENOUS

## 2015-04-03 MED ORDER — SODIUM CHLORIDE 0.9 % IR SOLN
Status: DC | PRN
  Administered 2015-04-03: 1000 mL

## 2015-04-03 MED ORDER — BUPIVACAINE-EPINEPHRINE (PF) 0.5% -1:200000 IJ SOLN
INTRAMUSCULAR | Status: DC | PRN
  Administered 2015-04-03: 30 mL via PERINEURAL

## 2015-04-03 MED ORDER — MAGNESIUM CITRATE PO SOLN: 1.0000 | Freq: Once | ORAL | Status: AC | PRN

## 2015-04-03 MED ORDER — METOCLOPRAMIDE HCL 5 MG PO TABS: 5.0000 mg | ORAL_TABLET | Freq: Three times a day (TID) | ORAL | Status: DC | PRN

## 2015-04-03 MED ORDER — ALUM & MAG HYDROXIDE-SIMETH 200-200-20 MG/5ML PO SUSP: 30.0000 mL | ORAL | Status: DC | PRN

## 2015-04-03 MED ORDER — PHENOL 1.4 % MT LIQD: 1.0000 | OROMUCOSAL | Status: DC | PRN

## 2015-04-03 MED ORDER — ONDANSETRON HCL 4 MG PO TABS: 4.0000 mg | ORAL_TABLET | Freq: Four times a day (QID) | ORAL | Status: DC | PRN

## 2015-04-03 MED ORDER — SUMATRIPTAN SUCCINATE 6 MG/0.5ML ~~LOC~~ SOLN
6.0000 mg | SUBCUTANEOUS | Status: DC | PRN
  Filled 2015-04-03: qty 0.5

## 2015-04-03 MED ORDER — ROCURONIUM BROMIDE 50 MG/5ML IV SOLN
INTRAVENOUS | Status: AC
  Filled 2015-04-03: qty 1

## 2015-04-03 MED ORDER — BUPIVACAINE-EPINEPHRINE (PF) 0.25% -1:200000 IJ SOLN
INTRAMUSCULAR | Status: AC
  Filled 2015-04-03: qty 30

## 2015-04-03 MED ORDER — HYDROMORPHONE HCL 1 MG/ML IJ SOLN
INTRAMUSCULAR | Status: AC
  Filled 2015-04-03: qty 1

## 2015-04-03 MED ORDER — SODIUM CHLORIDE 0.9 % IJ SOLN
INTRAMUSCULAR | Status: AC
  Filled 2015-04-03: qty 10

## 2015-04-03 MED ORDER — OXYCODONE HCL 5 MG/5ML PO SOLN: 5.0000 mg | Freq: Once | ORAL | Status: DC | PRN

## 2015-04-03 MED ORDER — METHOCARBAMOL 500 MG PO TABS
500.0000 mg | ORAL_TABLET | Freq: Four times a day (QID) | ORAL | Status: DC | PRN
  Administered 2015-04-03 – 2015-04-05 (×5): 500 mg via ORAL
  Filled 2015-04-03 (×5): qty 1

## 2015-04-03 MED ORDER — SODIUM CHLORIDE 0.9 % IV SOLN: INTRAVENOUS | Status: DC

## 2015-04-03 MED ORDER — EPHEDRINE SULFATE 50 MG/ML IJ SOLN
INTRAMUSCULAR | Status: DC | PRN
  Administered 2015-04-03 (×4): 10 mg via INTRAVENOUS

## 2015-04-03 MED ORDER — FENTANYL CITRATE (PF) 250 MCG/5ML IJ SOLN
INTRAMUSCULAR | Status: AC
  Filled 2015-04-03: qty 5

## 2015-04-03 MED ORDER — MIDAZOLAM HCL 2 MG/2ML IJ SOLN
INTRAMUSCULAR | Status: AC
  Filled 2015-04-03: qty 2

## 2015-04-03 MED ORDER — METHOCARBAMOL 1000 MG/10ML IJ SOLN
500.0000 mg | Freq: Four times a day (QID) | INTRAVENOUS | Status: DC | PRN
  Filled 2015-04-03: qty 5

## 2015-04-03 MED ORDER — ACETAMINOPHEN 10 MG/ML IV SOLN
1000.0000 mg | Freq: Four times a day (QID) | INTRAVENOUS | Status: DC
  Administered 2015-04-03: 1000 mg via INTRAVENOUS
  Filled 2015-04-03: qty 100

## 2015-04-03 MED ORDER — PHENYLEPHRINE HCL 10 MG/ML IJ SOLN
INTRAMUSCULAR | Status: DC | PRN
  Administered 2015-04-03: 60 ug via INTRAVENOUS
  Administered 2015-04-03: 80 ug via INTRAVENOUS
  Administered 2015-04-03 (×2): 60 ug via INTRAVENOUS

## 2015-04-03 MED ORDER — LIDOCAINE HCL (CARDIAC) 20 MG/ML IV SOLN
INTRAVENOUS | Status: DC | PRN
  Administered 2015-04-03: 60 mg via INTRAVENOUS

## 2015-04-03 MED ORDER — POLYETHYLENE GLYCOL 3350 17 G PO PACK: 17.0000 g | PACK | Freq: Every day | ORAL | Status: DC | PRN

## 2015-04-03 MED ORDER — RIVAROXABAN 10 MG PO TABS
10.0000 mg | ORAL_TABLET | Freq: Every day | ORAL | Status: DC
  Administered 2015-04-04 – 2015-04-05 (×2): 10 mg via ORAL
  Filled 2015-04-03 (×2): qty 1

## 2015-04-03 MED ORDER — FLUTICASONE PROPIONATE 50 MCG/ACT NA SUSP
1.0000 | Freq: Every day | NASAL | Status: DC
  Administered 2015-04-05: 1 via NASAL
  Filled 2015-04-03: qty 16

## 2015-04-03 MED ORDER — ONDANSETRON HCL 4 MG/2ML IJ SOLN: 4.0000 mg | Freq: Four times a day (QID) | INTRAMUSCULAR | Status: DC | PRN

## 2015-04-03 MED ORDER — MIDAZOLAM HCL 5 MG/5ML IJ SOLN
INTRAMUSCULAR | Status: DC | PRN
  Administered 2015-04-03: 2 mg via INTRAVENOUS

## 2015-04-03 MED ORDER — VENLAFAXINE HCL ER 37.5 MG PO CP24
37.5000 mg | ORAL_CAPSULE | Freq: Every day | ORAL | Status: DC
  Administered 2015-04-03 – 2015-04-04 (×2): 37.5 mg via ORAL
  Filled 2015-04-03 (×4): qty 1

## 2015-04-03 MED ORDER — DOCUSATE SODIUM 100 MG PO CAPS
100.0000 mg | ORAL_CAPSULE | Freq: Two times a day (BID) | ORAL | Status: DC
  Administered 2015-04-03 – 2015-04-05 (×5): 100 mg via ORAL
  Filled 2015-04-03 (×5): qty 1

## 2015-04-03 MED ORDER — METOCLOPRAMIDE HCL 5 MG/ML IJ SOLN: 5.0000 mg | Freq: Three times a day (TID) | INTRAMUSCULAR | Status: DC | PRN

## 2015-04-03 MED ORDER — VERAPAMIL HCL 240 MG (CO) PO TB24: 240.0000 mg | ORAL_TABLET | Freq: Two times a day (BID) | ORAL | Status: DC

## 2015-04-03 MED ORDER — LACTATED RINGERS IV SOLN
INTRAVENOUS | Status: DC | PRN
  Administered 2015-04-03 (×2): via INTRAVENOUS

## 2015-04-03 MED ORDER — EPHEDRINE SULFATE 50 MG/ML IJ SOLN
INTRAMUSCULAR | Status: AC
  Filled 2015-04-03: qty 1

## 2015-04-03 MED ORDER — HYDROMORPHONE HCL 1 MG/ML IJ SOLN
0.5000 mg | INTRAMUSCULAR | Status: DC | PRN
  Administered 2015-04-04: 1 mg via INTRAVENOUS
  Filled 2015-04-03: qty 1

## 2015-04-03 MED ORDER — PROPOFOL 10 MG/ML IV BOLUS
INTRAVENOUS | Status: DC | PRN
  Administered 2015-04-03: 150 mg via INTRAVENOUS

## 2015-04-03 MED ORDER — BUPIVACAINE-EPINEPHRINE 0.25% -1:200000 IJ SOLN
INTRAMUSCULAR | Status: DC | PRN
  Administered 2015-04-03: 30 mL

## 2015-04-03 MED ORDER — MENTHOL 3 MG MT LOZG: 1.0000 | LOZENGE | OROMUCOSAL | Status: DC | PRN

## 2015-04-03 MED ORDER — ONDANSETRON HCL 4 MG/2ML IJ SOLN
INTRAMUSCULAR | Status: AC
  Filled 2015-04-03: qty 2

## 2015-04-03 MED ORDER — FENTANYL CITRATE (PF) 100 MCG/2ML IJ SOLN
INTRAMUSCULAR | Status: DC | PRN
  Administered 2015-04-03: 50 ug via INTRAVENOUS

## 2015-04-03 SURGICAL SUPPLY — 69 items
BANDAGE ESMARK 6X9 LF (GAUZE/BANDAGES/DRESSINGS) ×1 IMPLANT
BLADE SAGITTAL 25.0X1.19X90 (BLADE) ×2 IMPLANT
BLADE SAGITTAL 25.0X1.19X90MM (BLADE) ×1
BNDG ESMARK 6X9 LF (GAUZE/BANDAGES/DRESSINGS) ×3
BOWL SMART MIX CTS (DISPOSABLE) ×3 IMPLANT
CAP KNEE TOTAL 3 SIGMA ×3 IMPLANT
CEMENT HV SMART SET (Cement) ×6 IMPLANT
COVER SURGICAL LIGHT HANDLE (MISCELLANEOUS) ×3 IMPLANT
CUFF TOURNIQUET SINGLE 34IN LL (TOURNIQUET CUFF) ×3 IMPLANT
CUFF TOURNIQUET SINGLE 44IN (TOURNIQUET CUFF) IMPLANT
DRAPE EXTREMITY T 121X128X90 (DRAPE) ×3 IMPLANT
DRAPE IMP U-DRAPE 54X76 (DRAPES) ×3 IMPLANT
DRAPE PROXIMA HALF (DRAPES) ×3 IMPLANT
DRSG ADAPTIC 3X8 NADH LF (GAUZE/BANDAGES/DRESSINGS) ×3 IMPLANT
DRSG PAD ABDOMINAL 8X10 ST (GAUZE/BANDAGES/DRESSINGS) ×6 IMPLANT
DURAPREP 26ML APPLICATOR (WOUND CARE) ×6 IMPLANT
ELECT CAUTERY BLADE 6.4 (BLADE) ×3 IMPLANT
ELECT REM PT RETURN 9FT ADLT (ELECTROSURGICAL) ×3
ELECTRODE REM PT RTRN 9FT ADLT (ELECTROSURGICAL) ×1 IMPLANT
EVACUATOR 1/8 PVC DRAIN (DRAIN) ×6 IMPLANT
FACESHIELD WRAPAROUND (MASK) ×6 IMPLANT
GAUZE SPONGE 4X4 12PLY STRL (GAUZE/BANDAGES/DRESSINGS) ×3 IMPLANT
GLOVE BIO SURGEON STRL SZ 6.5 (GLOVE) ×2 IMPLANT
GLOVE BIO SURGEONS STRL SZ 6.5 (GLOVE) ×1
GLOVE BIOGEL PI IND STRL 6.5 (GLOVE) ×1 IMPLANT
GLOVE BIOGEL PI IND STRL 7.0 (GLOVE) ×1 IMPLANT
GLOVE BIOGEL PI IND STRL 8 (GLOVE) ×1 IMPLANT
GLOVE BIOGEL PI IND STRL 8.5 (GLOVE) ×1 IMPLANT
GLOVE BIOGEL PI INDICATOR 6.5 (GLOVE) ×2
GLOVE BIOGEL PI INDICATOR 7.0 (GLOVE) ×2
GLOVE BIOGEL PI INDICATOR 8 (GLOVE) ×2
GLOVE BIOGEL PI INDICATOR 8.5 (GLOVE) ×2
GLOVE ECLIPSE 8.0 STRL XLNG CF (GLOVE) ×9 IMPLANT
GLOVE SURG ORTHO 8.5 STRL (GLOVE) ×9 IMPLANT
GLOVE SURG SS PI 6.5 STRL IVOR (GLOVE) ×6 IMPLANT
GOWN STRL REUS W/ TWL LRG LVL3 (GOWN DISPOSABLE) ×2 IMPLANT
GOWN STRL REUS W/TWL 2XL LVL3 (GOWN DISPOSABLE) ×3 IMPLANT
GOWN STRL REUS W/TWL LRG LVL3 (GOWN DISPOSABLE) ×4
HANDPIECE INTERPULSE COAX TIP (DISPOSABLE) ×4
KIT BASIN OR (CUSTOM PROCEDURE TRAY) ×3 IMPLANT
KIT ROOM TURNOVER OR (KITS) ×3 IMPLANT
MANIFOLD NEPTUNE II (INSTRUMENTS) ×3 IMPLANT
NEEDLE 22X1 1/2 (OR ONLY) (NEEDLE) ×3 IMPLANT
NS IRRIG 1000ML POUR BTL (IV SOLUTION) ×3 IMPLANT
PACK TOTAL JOINT (CUSTOM PROCEDURE TRAY) ×3 IMPLANT
PACK UNIVERSAL I (CUSTOM PROCEDURE TRAY) ×3 IMPLANT
PAD ARMBOARD 7.5X6 YLW CONV (MISCELLANEOUS) ×6 IMPLANT
PAD CAST 4YDX4 CTTN HI CHSV (CAST SUPPLIES) ×1 IMPLANT
PADDING CAST COTTON 4X4 STRL (CAST SUPPLIES) ×2
PADDING CAST COTTON 6X4 STRL (CAST SUPPLIES) ×3 IMPLANT
SET HNDPC FAN SPRY TIP SCT (DISPOSABLE) ×2 IMPLANT
SPONGE GAUZE 4X4 12PLY STER LF (GAUZE/BANDAGES/DRESSINGS) ×3 IMPLANT
STAPLER VISISTAT 35W (STAPLE) ×3 IMPLANT
SUCTION FRAZIER TIP 10 FR DISP (SUCTIONS) ×3 IMPLANT
SURGIFLO W/THROMBIN 8M KIT (HEMOSTASIS) IMPLANT
SUT BONE WAX W31G (SUTURE) ×3 IMPLANT
SUT ETHIBOND NAB CT1 #1 30IN (SUTURE) ×9 IMPLANT
SUT MNCRL AB 3-0 PS2 18 (SUTURE) ×3 IMPLANT
SUT VIC AB 0 CT1 27 (SUTURE) ×2
SUT VIC AB 0 CT1 27XBRD ANBCTR (SUTURE) ×1 IMPLANT
SUT VIC AB 1 CT1 27 (SUTURE) ×2
SUT VIC AB 1 CT1 27XBRD ANBCTR (SUTURE) ×1 IMPLANT
SYR CONTROL 10ML LL (SYRINGE) ×3 IMPLANT
TOWEL OR 17X24 6PK STRL BLUE (TOWEL DISPOSABLE) ×3 IMPLANT
TOWEL OR 17X26 10 PK STRL BLUE (TOWEL DISPOSABLE) ×3 IMPLANT
TRAY FOLEY CATH 16FRSI W/METER (SET/KITS/TRAYS/PACK) ×3 IMPLANT
WATER STERILE IRR 1000ML POUR (IV SOLUTION) ×6 IMPLANT
WRAP KNEE MAXI GEL POST OP (GAUZE/BANDAGES/DRESSINGS) ×3 IMPLANT
YANKAUER SUCT BULB TIP NO VENT (SUCTIONS) ×3 IMPLANT

## 2015-04-03 NOTE — Op Note (Signed)
PATIENT ID:      Sharon [name]  MRN:     [id number] DOB/AGE:    January 02, 1951 / 64 y.o.       OPERATIVE REPORT    DATE OF PROCEDURE:  04/03/2015       PREOPERATIVE DIAGNOSIS: END STAGE, PRIMARY  LEFT KNEE OSTEOARTHRITIS                                                       Estimated body mass index is 26.93 kg/(m^2) as calculated from the following:   Height as of this encounter: 5\' 7"  (1.702 m).   Weight as of this encounter: 78.019 kg (172 lb).     POSTOPERATIVE DIAGNOSIS:   LEFT KNEE OSTEOARTHRITIS -SAME                                                                    Estimated body mass index is 26.93 kg/(m^2) as calculated from the following:   Height as of this encounter: 5\' 7"  (1.702 m).   Weight as of this encounter: 78.019 kg (172 lb).     PROCEDURE:  Procedure(s):LEFT TOTAL KNEE ARTHROPLASTY      SURGEON:  [name], MD    ASSISTANT:   Biagio Borg, PA-C   (Present and scrubbed throughout the case, critical for assistance with exposure, retraction, instrumentation, and closure.)          ANESTHESIA: regional and general     DRAINS: (LEFT KNEE) Hemovact drain(s) in the CLAMPED with  Suction Clamped :      TOURNIQUET TIME:  Total Tourniquet Time Documented: Thigh (Left) - 74 minutes Total: Thigh (Left) - 74 minutes     COMPLICATIONS:  None   CONDITION:  stable  PROCEDURE IN DETAIL: [id number]   Sharon [name] 04/03/2015, 9:14 AM

## 2015-04-03 NOTE — H&P (Signed)
  The recent History & Physical has been reviewed. I have personally examined the patient today. There is no interval change to the documented History & Physical. The patient would like to proceed with the procedure.  [name] W 04/03/2015,  7:06 AM

## 2015-04-03 NOTE — Anesthesia Preprocedure Evaluation (Signed)
Anesthesia Evaluation  Patient identified by MRN, date of birth, ID band Patient awake    Reviewed: Allergy & Precautions, NPO status , Patient's Chart, lab work & pertinent test results  Airway Mallampati: II   Neck ROM: full    Dental   Pulmonary neg pulmonary ROS,  breath sounds clear to auscultation        Cardiovascular hypertension, Rhythm:regular Rate:Normal     Neuro/Psych  Headaches,    GI/Hepatic   Endo/Other    Renal/GU      Musculoskeletal  (+) Arthritis -,   Abdominal   Peds  Hematology   Anesthesia Other Findings   Reproductive/Obstetrics                             Anesthesia Physical Anesthesia Plan  ASA: II  Anesthesia Plan: General and Regional   Post-op Pain Management:    Induction: Intravenous  Airway Management Planned: LMA  Additional Equipment:   Intra-op Plan:   Post-operative Plan:   Informed Consent: I have reviewed the patients History and Physical, chart, labs and discussed the procedure including the risks, benefits and alternatives for the proposed anesthesia with the patient or authorized representative who has indicated his/her understanding and acceptance.     Plan Discussed with: CRNA, Anesthesiologist and Surgeon  Anesthesia Plan Comments:         Anesthesia Quick Evaluation

## 2015-04-03 NOTE — Progress Notes (Signed)
Orthopedic Tech Progress Note Patient Details:  Sharon [name] [birth year] [id number] CPM applied to LLE with appropriate settings. OHF applied to bed. CPM Left Knee CPM Left Knee: On Left Knee Flexion (Degrees): 90 Left Knee Extension (Degrees): 0   Asia R Thompson 04/03/2015, 11:01 AM

## 2015-04-03 NOTE — Anesthesia Postprocedure Evaluation (Signed)
Anesthesia Post Note  Patient: Sharon [name]  Procedure(s) Performed: Procedure(s) (LRB): TOTAL KNEE ARTHROPLASTY (Left)  Anesthesia type: General  Patient location: PACU  Post pain: Pain level controlled and Adequate analgesia  Post assessment: Post-op Vital signs reviewed, Patient's Cardiovascular Status Stable, Respiratory Function Stable, Patent Airway and Pain level controlled  Last Vitals:  Filed Vitals:   04/03/15 1015  BP:   Pulse: 76  Temp:   Resp: 7    Post vital signs: Reviewed and stable  Level of consciousness: awake, alert  and oriented  Complications: No apparent anesthesia complications

## 2015-04-03 NOTE — Evaluation (Signed)
Physical Therapy Evaluation Patient Details Name: Sharon [name] MRN: [id number] DOB: 08-17-1951 Today's Date: 04/03/2015   History of Present Illness  Pt is a 64 y/o F s/p L TKA.  Pt's PMH includes HTN, HA, and arthritis.  Clinical Impression  Pt is s/p L TKA resulting in the deficits listed below (see PT Problem List). Pt ambulated 5 ft before becoming nauseous and demonstrated L knee buckle x1. Pt will benefit from skilled PT to increase their independence and safety with mobility to allow discharge to the venue listed below.     Follow Up Recommendations Home health PT;Supervision for mobility/OOB    Equipment Recommendations  None recommended by PT    Recommendations for Other Services OT consult     Precautions / Restrictions Precautions Precautions: Fall;Knee Precaution Booklet Issued: Yes (comment) Precaution Comments: Reviewed no pillow under knee and WB status Restrictions Weight Bearing Restrictions: Yes LLE Weight Bearing: Partial weight bearing LLE Partial Weight Bearing Percentage or Pounds: 50      Mobility  Bed Mobility Overal bed mobility: Needs Assistance Bed Mobility: Supine to Sit     Supine to sit: Min assist     General bed mobility comments: min assist w/ managing LLE, VCs for technique, use of bed rail and increased time  Transfers Overall transfer level: Needs assistance Equipment used: Rolling walker (2 wheeled) Transfers: Sit to/from Stand Sit to Stand: Min guard         General transfer comment: VCs for hand placement and to lean forward during sit>stand  Ambulation/Gait Ambulation/Gait assistance: Min guard Ambulation Distance (Feet): 5 Feet Assistive device: Rolling walker (2 wheeled) Gait Pattern/deviations: Step-to pattern;Shuffle;Antalgic;Trunk flexed;Decreased stride length   Gait velocity interpretation: Below normal speed for age/gender General Gait Details: Amb 5 ft and began to feel nauseous and demonstrated knee  buckle x1 before getting to recliner chair.  Stairs            Wheelchair Mobility    Modified Rankin (Stroke Patients Only)       Balance Overall balance assessment: Needs assistance Sitting-balance support: Bilateral upper extremity supported;Feet supported Sitting balance-Leahy Scale: Fair     Standing balance support: Bilateral upper extremity supported;During functional activity Standing balance-Leahy Scale: Poor Standing balance comment: Pt requires support from RW to maintain balance                             Pertinent Vitals/Pain Pain Assessment: 0-10 Pain Score: 6  Pain Location: L knee Pain Descriptors / Indicators: Aching;Discomfort;Grimacing;Guarding Pain Intervention(s): Limited activity within patient's tolerance;Monitored during session;Repositioned    Home Living Family/patient expects to be discharged to:: Private residence Living Arrangements: Spouse/significant other Available Help at Discharge: Family;Available 24 hours/day Type of Home: House Home Access: Stairs to enter Entrance Stairs-Rails: None Entrance Stairs-Number of Steps: 1 Home Layout: Two level (Have a twin bed downstairs w/ half bath) Home Equipment: Walker - 2 wheels;Cane - single point;Bedside commode;Crutches      Prior Function Level of Independence: Independent with assistive device(s)         Comments: SPC w/ long distance ambulation PTA     Hand Dominance        Extremity/Trunk Assessment               Lower Extremity Assessment: LLE deficits/detail   LLE Deficits / Details: weakness and limited ROM as expected s/p L TKA     Communication   Communication: No difficulties  Cognition Arousal/Alertness: Awake/alert Behavior During Therapy: WFL for tasks assessed/performed Overall Cognitive Status: Within Functional Limits for tasks assessed                      General Comments General comments (skin integrity, edema, etc.):  Pt's knee is bleeding through dressing, RN notified.    Exercises Total Joint Exercises Ankle Circles/Pumps: AROM;Both;10 reps;Supine Quad Sets: AROM;Both;5 reps;Supine Heel Slides: AROM;Left;5 reps;Supine Knee Flexion: AROM;Left;5 reps;Seated Goniometric ROM: 5-89      Assessment/Plan    PT Assessment Patient needs continued PT services  PT Diagnosis Difficulty walking;Abnormality of gait;Generalized weakness;Acute pain   PT Problem List Decreased strength;Decreased range of motion;Decreased balance;Decreased activity tolerance;Decreased mobility;Decreased coordination;Decreased knowledge of use of DME;Decreased safety awareness;Decreased knowledge of precautions;Impaired sensation;Decreased skin integrity;Pain  PT Treatment Interventions DME instruction;Gait training;Stair training;Functional mobility training;Therapeutic activities;Therapeutic exercise;Balance training;Neuromuscular re-education;Patient/family education;Modalities   PT Goals (Current goals can be found in the Care Plan section) Acute Rehab PT Goals Patient Stated Goal: to get stronger and go home PT Goal Formulation: With patient/family Time For Goal Achievement: 04/10/15 Potential to Achieve Goals: Good    Frequency 7X/week   Barriers to discharge Inaccessible home environment 1 step to enter home and flight to get upstairs to shower    Co-evaluation               End of Session Equipment Utilized During Treatment: Gait belt Activity Tolerance: Other (comment);Patient limited by pain (limited by nausea) Patient left: in chair;with call bell/phone within reach;with family/visitor present Nurse Communication: Mobility status;Precautions;Weight bearing status;Other (comment) (pt's L knee bleeding through bandage)         Time: 9842-1031 PT Time Calculation (min) (ACUTE ONLY): 38 min   Charges:   PT Evaluation $Initial PT Evaluation Tier I: 1 Procedure PT Treatments $Therapeutic Exercise: 23-37  mins   PT G Codes:       Joslyn Hy PT, DPT [phone] Pager: [phone] 04/03/2015, 2:41 PM

## 2015-04-03 NOTE — Transfer of Care (Signed)
Immediate Anesthesia Transfer of Care Note  Patient: Sharon [name]  Procedure(s) Performed: Procedure(s): TOTAL KNEE ARTHROPLASTY (Left)  Patient Location: PACU  Anesthesia Type:General  Level of Consciousness: awake and alert   Airway & Oxygen Therapy: Patient Spontanous Breathing and Patient connected to nasal cannula oxygen  Post-op Assessment: Report given to RN and Post -op Vital signs reviewed and stable  Post vital signs: Reviewed and stable  Last Vitals:  Filed Vitals:   04/03/15 0625  BP: 152/95  Pulse: 64  Temp: 36.8 C  Resp: 20    Complications: No apparent anesthesia complications

## 2015-04-03 NOTE — Progress Notes (Signed)
Orthopedic Tech Progress Note Patient Details:  Sharon [name] 1950/11/06 [id number] On cpm at 6:50 pm Patient ID: Sharon [name], female   DOB: 1951/02/14, 64 y.o.   MRN: [id number]   Sharon [name] 04/03/2015, 6:54 PM

## 2015-04-03 NOTE — Anesthesia Procedure Notes (Addendum)
Anesthesia Regional Block:  Femoral nerve block  Pre-Anesthetic Checklist: ,, timeout performed, Correct Patient, Correct Site, Correct Laterality, Correct Procedure,, site marked, risks and benefits discussed, Surgical consent,  Pre-op evaluation,  At surgeon's request and post-op pain management  Laterality: Left  Prep: chloraprep       Needles:  Injection technique: Single-shot  Needle Type: Echogenic Stimulator Needle     Needle Length: 9cm 9 cm Needle Gauge: 21 and 21 G    Additional Needles:  Procedures: nerve stimulator Femoral nerve block  Nerve Stimulator or Paresthesia:  Response: Quadriceps muscle contraction, 0.45 mA,   Additional Responses:   Narrative:  Start time: 04/03/2015 6:57 AM End time: 04/03/2015 7:06 AM Injection made incrementally with aspirations every 5 mL.  Performed by: Personally  Anesthesiologist: HODIERNE, ADAM  Additional Notes: Functioning IV was confirmed and monitors were applied.  A 46mm 21ga Arrow echogenic stimulator needle was used. Sterile prep and drape,hand hygiene and sterile gloves were used.  Negative aspiration and negative test dose prior to incremental administration of local anesthetic. The patient tolerated the procedure well.     Procedure Name: LMA Insertion Performed by: Gean Maidens Pre-anesthesia Checklist: Patient identified, Emergency Drugs available, Suction available, Timeout performed and Patient being monitored Patient Re-evaluated:Patient Re-evaluated prior to inductionOxygen Delivery Method: Circle system utilized Preoxygenation: Pre-oxygenation with 100% oxygen Intubation Type: IV induction Ventilation: Mask ventilation without difficulty LMA: LMA inserted LMA Size: 4.0 Number of attempts: 1 Placement Confirmation: positive ETCO2,  CO2 detector and breath sounds checked- equal and bilateral Tube secured with: Tape Dental Injury: Teeth and Oropharynx as per pre-operative assessment

## 2015-04-03 NOTE — Care Management (Signed)
Utilization review completed by Tyjuan Demetro N. Sumaiya Arruda, RN BSN 

## 2015-04-04 ENCOUNTER — Encounter (HOSPITAL_COMMUNITY): Payer: Self-pay | Admitting: Orthopaedic Surgery

## 2015-04-04 LAB — CBC
HCT: 27.2 % — ABNORMAL LOW (ref 36.0–46.0)
HEMOGLOBIN: 9.2 g/dL — AB (ref 12.0–15.0)
MCH: 31.2 pg (ref 26.0–34.0)
MCHC: 33.8 g/dL (ref 30.0–36.0)
MCV: 92.2 fL (ref 78.0–100.0)
Platelets: 228 10*3/uL (ref 150–400)
RBC: 2.95 MIL/uL — ABNORMAL LOW (ref 3.87–5.11)
RDW: 12.8 % (ref 11.5–15.5)
WBC: 12 10*3/uL — ABNORMAL HIGH (ref 4.0–10.5)

## 2015-04-04 LAB — BASIC METABOLIC PANEL
Anion gap: 6 (ref 5–15)
BUN: 11 mg/dL (ref 6–20)
CO2: 27 mmol/L (ref 22–32)
CREATININE: 0.69 mg/dL (ref 0.44–1.00)
Calcium: 8.5 mg/dL — ABNORMAL LOW (ref 8.9–10.3)
Chloride: 102 mmol/L (ref 101–111)
GFR calc Af Amer: >60 mL/min (ref >60)
Glucose, Bld: 138 mg/dL — ABNORMAL HIGH (ref 65–99)
Potassium: 3.5 mmol/L (ref 3.5–5.1)
Sodium: 135 mmol/L (ref 135–145)

## 2015-04-04 NOTE — Progress Notes (Signed)
Patient ID: Sharon [name], female   DOB: [birth year], 64 y.o.   MRN: [id number] PATIENT ID: Sharon [name]        MRN:  [id number]          DOB/AGE: Aug 23, 1951 / 64 y.o.    [name], MD   Biagio Borg, PA-C [address] [place], [place]  [address]                             [phone]   PROGRESS NOTE  Subjective:  negative for Chest Pain  negative for Shortness of Breath  negative for Nausea/Vomiting   negative for Calf Pain    Tolerating Diet: yes         Patient reports pain as mild.     Good night with some sleep-pain controlled with present meds  Objective: Vital signs in last 24 hours:   Patient Vitals for the past 24 hrs:  BP Temp Temp src Pulse Resp SpO2 Height Weight  04/04/15 0529 106/60 mmHg 98.5 F (36.9 C) Oral 67 16 96 % - -  04/04/15 0105 (!) 98/53 mmHg 98.2 F (36.8 C) Oral 68 16 (!) 87 % - -  04/03/15 2040 (!) 103/55 mmHg 98.2 F (36.8 C) - 66 15 96 % - -  04/03/15 1100 118/71 mmHg 98.9 F (37.2 C) Oral 72 - 95 % - -  04/03/15 1047 - - - - - - 5\' 7"  (1.702 m) 77.111 kg (170 lb)  04/03/15 1015 - - - 76 (!) 7 99 % - -  04/03/15 1009 - - - 75 - 96 % - -  04/03/15 1000 - - - 79 13 97 % - -  04/03/15 0947 (!) 141/76 mmHg - - 88 14 99 % - -  04/03/15 0945 - - - 91 - 96 % - -  04/03/15 0944 - 97.9 F (36.6 C) - 91 15 96 % - -      Intake/Output from previous day:   06/14 0701 - 06/15 0700 In: 2006.3 [I.V.:1606.3] Out: 1375 [Urine:1000; Drains:325]   Intake/Output this shift:       Intake/Output      06/14 0701 - 06/15 0700 06/15 0701 - 06/16 0700   I.V. (mL/kg) 1606.3 (20.8)    IV Piggyback 400    Total Intake(mL/kg) 2006.3 (26)    Urine (mL/kg/hr) 1000 (0.5)    Drains 325 (0.2)    Blood 50 (0)    Total Output 1375     Net +631.3             LABORATORY DATA:  Recent Labs  04/04/15 0505  WBC 12.0*  HGB 9.2*  HCT 27.2*  PLT 228    Recent Labs  04/04/15 0505  NA 135  K 3.5  CL 102  CO2 27  BUN 11  CREATININE  0.69  GLUCOSE 138*  CALCIUM 8.5*   Lab Results  Component Value Date   INR 1.03 03/22/2015   INR 0.92 04/16/2011    Recent Radiographic Studies :  Dg Chest 2 View  03/23/2015   CLINICAL DATA:  64 year old female with a history thinning knee replacement. Preop study.  EXAM: CHEST - 2 VIEW  COMPARISON:  04/16/2011  FINDINGS: Cardiomediastinal silhouette projects within normal limits in size and contour. No confluent airspace disease, pneumothorax, or pleural effusion.  Surgical changes of the right axillary region.  No displaced fracture.  Unremarkable appearance of the  upper abdomen.  The azygos lobe.  IMPRESSION: No radiographic evidence of acute cardiopulmonary disease.  Signed,  Dulcy Fanny. Earleen Newport, DO  [facility]  [facility]   Electronically Signed   By: Corrie Mckusick D.O.   On: 03/23/2015 07:49     Examination:  General appearance: alert, cooperative and no distress  Wound Exam: draining through dressing last night while hemovac clamped  Drainage:  Moderate amount Serosanguinous exudate-as above  Motor Exam: EHL, FHL, Anterior Tibial and Posterior Tibial Intact  Sensory Exam: Superficial Peroneal, Deep Peroneal and Tibial normal  Vascular Exam: Normal  Assessment:    1 Day Post-Op  Procedure(s) (LRB): TOTAL KNEE ARTHROPLASTY (Left)  ADDITIONAL DIAGNOSIS:  Principal Problem:   Osteoarthritis of left knee Active Problems:   S/P total knee replacement using cement  Acute Blood Loss Anemia-monitor   Plan: Physical Therapy as ordered Partial Weight Bearing @ 50% (PWB)  DVT Prophylaxis:  Xarelto, Foot Pumps and TED hose  DISCHARGE PLAN: Home  DISCHARGE NEEDS: HHPT, CPM, Walker and 3-in-1 comode seat   dressing changed and aquacell applied, hemovac removed, wounds cleaned with sterile saline, denies SOB or chest pain, no calf pain--OOB with PT,saline lock IV, hope for D/C tomorrow     [name] W  04/04/2015 7:55  AM

## 2015-04-04 NOTE — Op Note (Signed)
NAMEZIMAL, WEISENSEL NO.:  [id number]  MEDICAL RECORD NO.:  [id number]  LOCATION:  5N07C                        FACILITY:  [facility]  PHYSICIAN:  Vonna Kotyk. Versie Soave, M.D.DATE OF BIRTH:  Sep 09, 1951  DATE OF PROCEDURE:  04/03/2015 DATE OF DISCHARGE:                              OPERATIVE REPORT   PREOPERATIVE DIAGNOSE:  End-stage, primary osteoarthritis, left knee.  POSTOPERATIVE DIAGNOSIS:  End-stage, primary osteoarthritis, left knee.  PROCEDURE:  Left total knee replacement.  SURGEON:  Vonna Kotyk. Durward Fortes, M.D.  ASSISTANT:  Aaron Edelman D. Petrarca, P.A.-C., was present throughout the operative procedure to ensure its timely completion.  ANESTHESIA:  General with supplemental adductor canal nerve block.  COMPLICATIONS:  None.  COMPONENTS:  DePuy LCS standard plus femoral component #2.5 rotating keeled tibial tray with a 10-mm polyethylene bridging bearing and metal- backed 3-peg rotating patella.  Components were secured with polymethyl methacrylate.  DESCRIPTION OF PROCEDURE:  Sharon [name] was met in the holding area, identified the left knee as appropriate operative site, marked it accordingly.  She did receive an adductor canal nerve block per Anesthesia.  The patient was then transported to room #12 and placed under general anesthesia without difficulty.  Nursing staff inserted a Foley catheter.  Urine was clear.  Time-out was called.  The right lower extremity was then placed in a thigh tourniquet and the leg was prepped with chlorhexidine scrub and DuraPrep x2 from the tourniquet to the tips of the toes.  Sterile draping was performed. With the extremity elevated, it was Esmarch exsanguinated with a proximal tourniquet at 350 mmHg.  A midline longitudinal incision was made centered about the patella, extending from the superior pouch to tibial tubercle.  Via sharp dissection, incision was carried down to subcutaneous tissue.  First layer of capsule  was incised in the midline.  A medial parapatellar incision was then made with the Bovie.  Joint was entered.  There was a clear yellow joint effusion.  The patella was everted 180 degrees laterally and the knee flexed to 90 degrees.  There were large osteophytes along the medial and lateral femoral condyles and near complete absence of articular cartilage in the medial compartment including the femoral condyle and tibial plateau. There were large areas of articular cartilage loss in the lateral compartment and patellofemoral joint as well.  Synovectomy was performed.  Osteophytes removed from about the periphery of the femur and tibia.  At that point, I sized to standard plus femoral component.  First, bony cut was then made transversely in the proximal tibia with a 7-degree angle of declination using external tibial guide, with each bony cut on the femur and the tibia.  I checked to be sure alignment was appropriate.  Subsequent cuts were then made on the femur using the standard plus femoral jig.  Used a 4-degree distal femoral valgus cut.  MCL and LCL remained intact throughout the procedure.  Laminar spreaders were inserted along the medial lateral compartment that I could easily visualize each compartment and remove ACL, PCL, medial lateral menisci.  There was a well developed popliteal cyst medially.  The visualized portion of cyst was removed with the Bovie.  Osteophytes were removed from the posterior  femoral condyle using a 3/4- inch curved osteotome.  Final cut was then made for tapering purposes on the femur using the finishing guide.  Our flexion and extension gaps were perfectly symmetrical at 10 mm.  Retractors were then placed around the tibia, was advanced anteriorly, measured 2.5 tibial tray, this was pinned in place.  A center hole was made followed by the keeled cut.  With the tibial tray still in place, the 10 mm polyethylene bridging bearing was applied  followed by the trial standard plus femoral component.  The joint was reduced through full range of motion and remained perfectly stable.  There was no opening with varus or valgus stress.  We had full extension.  The patella was prepared by removing of about 8 mm of bone, leaving 13 mm of patellar thickness.  Three holes were made and trial patella inserted and then reduced and through a full range of motion, it remained stable.  Trial components removed.  The joint was copiously irrigated with saline solution.  Each of the final components were then impacted with polymethyl methacrylate.  I initially inserted the tibia followed by the polyethylene component and the femoral component and then the patella. Extraneous methacrylate was removed from the periphery of the components.  While waiting for the methacrylate to mature, the joint was infiltrated with 0.25% Marcaine with epinephrine.  At approximately 16 minutes of methacrylate had matured, at that point, we inspected the joint, it was clear of any loose material.  Tourniquet was deflated at 74 minutes.  Gross bleeders were Bovie coagulated.  Any bleeding bones controlled with bone wax.  Medium sized Hemovac was placed in the lateral compartment.  The deep capsule was then closed with 0 Ethibond suture, the superficial capsule with a running 0 Vicryl, subcu with 3-0 Monocryl.  Skin closed with skin clips.  Sterile bulky dressing was applied followed by the patient's support stocking.  The patient tolerated the procedure without complications.     Vonna Kotyk. Durward Fortes, M.D.     PWW/MEDQ  D:  04/03/2015  T:  04/04/2015  Job:  [id number]

## 2015-04-04 NOTE — Discharge Instructions (Signed)
Information on my medicine - XARELTO® (Rivaroxaban) ° °This medication education was reviewed with me or my healthcare representative as part of my discharge preparation.  The pharmacist that spoke with me during my hospital stay was:  Stefanny Pieri Stillinger, RPH ° °Why was Xarelto® prescribed for you? °Xarelto® was prescribed for you to reduce the risk of blood clots forming after orthopedic surgery. The medical term for these abnormal blood clots is venous thromboembolism (VTE). ° °What do you need to know about xarelto® ? °Take your Xarelto® ONCE DAILY at the same time every day. °You may take it either with or without food. ° °If you have difficulty swallowing the tablet whole, you may crush it and mix in applesauce just prior to taking your dose. ° °Take Xarelto® exactly as prescribed by your doctor and DO NOT stop taking Xarelto® without talking to the doctor who prescribed the medication.  Stopping without other VTE prevention medication to take the place of Xarelto® may increase your risk of developing a clot. ° °After discharge, you should have regular check-up appointments with your healthcare provider that is prescribing your Xarelto®.   ° °What do you do if you miss a dose? °If you miss a dose, take it as soon as you remember on the same day then continue your regularly scheduled once daily regimen the next day. Do not take two doses of Xarelto® on the same day.  ° °Important Safety Information °A possible side effect of Xarelto® is bleeding. You should call your healthcare provider right away if you experience any of the following: °? Bleeding from an injury or your nose that does not stop. °? Unusual colored urine (red or dark brown) or unusual colored stools (red or black). °? Unusual bruising for unknown reasons. °? A serious fall or if you hit your head (even if there is no bleeding). ° °Some medicines may interact with Xarelto® and might increase your risk of bleeding while on Xarelto®. To  help avoid this, consult your healthcare provider or pharmacist prior to using any new prescription or non-prescription medications, including herbals, vitamins, non-steroidal anti-inflammatory drugs (NSAIDs) and supplements. ° °This website has more information on Xarelto®: www.xarelto.com. ° ° ° °

## 2015-04-04 NOTE — Care Management Note (Signed)
Case Management Note  Patient Details  Name: Sharon [name] MRN: [id number] Date of Birth: Jul 16, 1951  Subjective/Objective:                S/p left total knee arthroplasty    Action/Plan:  Set up with [facility] for HHPT by MD office. Spoke with patient, no change in discharge plan. Patient states that she has a rolling walker and 3N1 at home and her husband will be able to assist her after discharge.[employer] has delivered her CPM to home.   Expected Discharge Date:  04/05/15               Expected Discharge Plan:  [place]  In-House Referral:  NA  Discharge planning Services  CM Consult  Post Acute Care Choice:  Home Health, Durable Medical Equipment Choice offered to:  Patient  DME Arranged:  CPM DME Agency:  [employer]  HH Arranged:  PT HH Agency:  [place]  Status of Service:  Completed, signed off  Medicare Important Message Given:    Date Medicare IM Given:    Medicare IM give by:    Date Additional Medicare IM Given:    Additional Medicare Important Message give by:     If discussed at [place] of Stay Meetings, dates discussed:    Additional Comments:  Nila Nephew, RN 04/04/2015, 12:14 PM

## 2015-04-04 NOTE — Progress Notes (Signed)
Occupational Therapy Evaluation Patient Details Name: Sharon [name] MRN: [id number] DOB: [birth year] Today's Date: 04/04/2015    History of Present Illness Pt is a 64 y/o F s/p L TKA.  Pt's PMH includes HTN, HA, and arthritis.   Clinical Impression   Pt admitted with the above diagnoses and presents with below problem list. Pt will benefit from continued acute OT to address the below listed deficits and maximize independence with BADLs prior to d/c home with spouse assist. PTA pt was mod I with ADLs. Pt is currently min guard with LB ADLs. ADLs completed and education provided as detailed below.      Follow Up Recommendations  Supervision/Assistance - 24 hour;No OT follow up    Equipment Recommendations  None recommended by OT    Recommendations for Other Services       Precautions / Restrictions Precautions Precautions: Fall;Knee Precaution Comments: Reviewed no pillow under knee and WB status Restrictions Weight Bearing Restrictions: Yes LLE Weight Bearing: Partial weight bearing LLE Partial Weight Bearing Percentage or Pounds: 50      Mobility Bed Mobility Overal bed mobility: Needs Assistance Bed Mobility: Supine to Sit     Supine to sit: Min guard     General bed mobility comments: min guard for safety, HOB elevated, no physical assist  Transfers Overall transfer level: Needs assistance Equipment used: Rolling walker (2 wheeled) Transfers: Sit to/from Stand Sit to Stand: Min guard         General transfer comment: cues for hand placement    Balance Overall balance assessment: Needs assistance Sitting-balance support: No upper extremity supported;Feet supported Sitting balance-Leahy Scale: Good     Standing balance support: Bilateral upper extremity supported;During functional activity Standing balance-Leahy Scale: Poor Standing balance comment: rw for balance and to assist with PWB                            ADL Overall ADL's :  Needs assistance/impaired Eating/Feeding: Set up;Sitting   Grooming: Set up;Standing;Sitting;Wash/dry hands   Upper Body Bathing: Set up;Sitting   Lower Body Bathing: Min guard;Sit to/from stand Lower Body Bathing Details (indicate cue type and reason): able to reach straight down leg to access feet Upper Body Dressing : Set up;Sitting   Lower Body Dressing: Min guard;Sit to/from stand   Toilet Transfer: Min guard;Ambulation;RW (3n1 over toilet)   Toileting- Clothing Manipulation and Hygiene: Min guard;Sit to/from stand   Tub/ Shower Transfer: Min guard;Ambulation;3 in 1;Rolling walker   Functional mobility during ADLs: Min guard;Rolling walker General ADL Comments: Pt ambulated to the bathroom and completed toilet transfer to 3n1 over toilet all at min guard level. Pt stood to wash hands with no LOB. ADl and home setup education provided.      Vision     Perception     Praxis      Pertinent Vitals/Pain Pain Assessment: 0-10 Pain Score: 1  Pain Location: Lt knee Pain Descriptors / Indicators: Aching Pain Intervention(s): Monitored during session     Hand Dominance     Extremity/Trunk Assessment Upper Extremity Assessment Upper Extremity Assessment: Overall WFL for tasks assessed   Lower Extremity Assessment Lower Extremity Assessment: Defer to PT evaluation       Communication Communication Communication: No difficulties   Cognition Arousal/Alertness: Awake/alert Behavior During Therapy: WFL for tasks assessed/performed Overall Cognitive Status: Within Functional Limits for tasks assessed  General Comments       Exercises       Shoulder Instructions      Home Living Family/patient expects to be discharged to:: Private residence Living Arrangements: Spouse/significant other Available Help at Discharge: Family;Available 24 hours/day Type of Home: House Home Access: Stairs to enter [employer] of Steps:  1 Entrance Stairs-Rails: None Home Layout: Two level Alternate Level Stairs-Number of Steps: 12 Alternate Level Stairs-Rails: Right Bathroom Shower/Tub: Tub/shower unit     Bathroom Accessibility: Yes How Accessible: Accessible via walker Home Equipment: [place] - 2 wheels;Cane - single point;Bedside commode;Crutches   Additional Comments: Plans to sponge bathe until she can go flight of stairs. Last knee surgery she stood with spouse assit and stepped over tub to use 3n1 facing showerhead.      Prior Functioning/Environment Level of Independence: Independent with assistive device(s)        Comments: SPC w/ long distance ambulation PTA    OT Diagnosis: Acute pain   OT Problem List: Impaired balance (sitting and/or standing);Decreased knowledge of use of DME or AE;Decreased knowledge of precautions;Pain   OT Treatment/Interventions: Self-care/ADL training;DME and/or AE instruction;Therapeutic activities;Balance training;Patient/family education    OT Goals(Current goals can be found in the care plan section) Acute Rehab OT Goals Patient Stated Goal: to get stronger and go home OT Goal Formulation: With patient Time For Goal Achievement: 04/11/15 Potential to Achieve Goals: Good ADL Goals Pt Will Perform Lower Body Dressing: with modified independence;sit to/from stand Pt Will Transfer to Toilet: with modified independence;ambulating (3n1 over toilet)  OT Frequency: Min 2X/week   Barriers to D/C:            Co-evaluation              End of Session Equipment Utilized During Treatment: Gait belt;Rolling walker CPM Left Knee Additional Comments: folded blanket under heel  Activity Tolerance: Patient tolerated treatment well;No increased pain Patient left: in chair;with call bell/phone within reach   Time: 0850-0917 OT Time Calculation (min): 27 min Charges:  OT General Charges $OT Visit: 1 Procedure OT Evaluation $Initial OT Evaluation Tier I: 1  Procedure OT Treatments $Self Care/Home Management : 8-22 mins G-Codes:    Hortencia Pilar 05-04-15, 9:32 AM

## 2015-04-04 NOTE — Progress Notes (Signed)
Physical Therapy Treatment Patient Details Name: Sharon [name] MRN: [id number] DOB: June 08, 1951 Today's Date: 04/04/2015    History of Present Illness Pt is a 64 y/o F s/p L TKA.  Pt's PMH includes HTN, HA, and arthritis.    PT Comments    Patient is making good progress with PT.  From a mobility standpoint anticipate patient will be ready for DC home tomorrow.  Pt ambulated 150 ft this session w/ supervision.  Pt will benefit from continued skilled PT services to increase functional independence and safety.   Follow Up Recommendations  Home health PT;Supervision for mobility/OOB     Equipment Recommendations  None recommended by PT    Recommendations for Other Services       Precautions / Restrictions Precautions Precautions: Fall;Knee Precaution Comments: Reviewed no pillow under knee and WB status Restrictions Weight Bearing Restrictions: Yes LLE Weight Bearing: Partial weight bearing LLE Partial Weight Bearing Percentage or Pounds: 50    Mobility  Bed Mobility Overal bed mobility: Modified Independent Bed Mobility: Supine to Sit;Sit to Supine     Supine to sit: Modified independent (Device/Increase time) Sit to supine: Modified independent (Device/Increase time)   General bed mobility comments: Inc time.  Good carryover from previous sessions.  HOB flat and no use of bed rails.  Transfers Overall transfer level: Needs assistance Equipment used: Rolling walker (2 wheeled) Transfers: Sit to/from Stand Sit to Stand: Supervision         General transfer comment: Supervison for safety  Ambulation/Gait Ambulation/Gait assistance: Supervision Ambulation Distance (Feet): 150 Feet Assistive device: Rolling walker (2 wheeled) Gait Pattern/deviations: Step-to pattern;Step-through pattern;Antalgic;Trunk flexed;Decreased weight shift to left   Gait velocity interpretation: Below normal speed for age/gender General Gait Details: Cues for step-through gait which  pt was able to demonstrate; however returns to step-to gait if not focusing on task.  Inc WB through [place].     Stairs            Wheelchair Mobility    Modified Rankin (Stroke Patients Only)       Balance Overall balance assessment: Needs assistance Sitting-balance support: No upper extremity supported;Feet supported Sitting balance-Leahy Scale: Good Sitting balance - Comments: Pt able to don socks Ind sitting EOB prior to standing   Standing balance support: No upper extremity supported;During functional activity Standing balance-Leahy Scale: Fair Standing balance comment: Pt able to stand and talk w/o BUE support                    Cognition Arousal/Alertness: Awake/alert Behavior During Therapy: WFL for tasks assessed/performed Overall Cognitive Status: Within Functional Limits for tasks assessed                      Exercises      General Comments        Pertinent Vitals/Pain Pain Assessment: 0-10 Pain Score: 3  Pain Location: L knee Pain Descriptors / Indicators: Aching Pain Intervention(s): Limited activity within patient's tolerance;Monitored during session;Repositioned    Home Living                      Prior Function            PT Goals (current goals can now be found in the care plan section) Acute Rehab PT Goals Patient Stated Goal: to get stronger and go home PT Goal Formulation: With patient/family Time For Goal Achievement: 04/10/15 Potential to Achieve Goals: Good Progress towards PT goals: Progressing  toward goals    Frequency  7X/week    PT Plan Current plan remains appropriate    Co-evaluation             End of Session Equipment Utilized During Treatment: Gait belt Activity Tolerance: Patient tolerated treatment well Patient left: in bed;with call bell/phone within reach;with family/visitor present     Time: 2257-5051 PT Time Calculation (min) (ACUTE ONLY): 18 min  Charges:  $Gait  Training: 8-22 mins                    G Codes:      Joslyn Hy PT, [place] [phone] Pager: [phone] 04/04/2015, 4:49 PM

## 2015-04-04 NOTE — Progress Notes (Signed)
Physical Therapy Treatment Patient Details Name: Sharon [name] MRN: [id number] DOB: 06/24/1951 Today's Date: 04/04/2015    History of Present Illness Pt is a 64 y/o F s/p L TKA.  Pt's PMH includes HTN, HA, and arthritis.    PT Comments    Patient is making good progress with PT and demonstrated ability to ambulate 80 ft and ascend/descend 1 step this session.  Pt will benefit from continued skilled PT services to increase functional independence and safety.   Follow Up Recommendations  Home health PT;Supervision for mobility/OOB     Equipment Recommendations  None recommended by PT    Recommendations for Other Services       Precautions / Restrictions Precautions Precautions: Fall;Knee Precaution Comments: Reviewed no pillow under knee and WB status Restrictions Weight Bearing Restrictions: Yes LLE Weight Bearing: Partial weight bearing LLE Partial Weight Bearing Percentage or Pounds: 50    Mobility  Bed Mobility Overal bed mobility: Needs Assistance Bed Mobility: Supine to Sit     Supine to sit: Min guard     General bed mobility comments: in recliner  Transfers Overall transfer level: Needs assistance Equipment used: Rolling walker (2 wheeled) Transfers: Sit to/from Stand Sit to Stand: Min assist         General transfer comment: Pt w/ LOB upon initial standing, requiring min assist to stabilize.  Pt able to safely perform sit>stand w/o LOB if she focuses on placing more weight through RLE.  Ambulation/Gait Ambulation/Gait assistance: Min guard Ambulation Distance (Feet): 80 Feet Assistive device: Rolling walker (2 wheeled) Gait Pattern/deviations: Step-to pattern;Step-through pattern;Antalgic;Decreased stance time - left;Decreased stride length   Gait velocity interpretation: Below normal speed for age/gender General Gait Details: Cues for step-through gait which pt was able to demonstrate; however returns to step-to gait if not focusing on task.   Inc WB through [place].     Stairs Stairs: Yes Stairs assistance: Min guard Stair Management: No rails;Backwards;With walker Number of Stairs: 1 General stair comments: Demonstration and VCs for technique.    Wheelchair Mobility    Modified Rankin (Stroke Patients Only)       Balance Overall balance assessment: Needs assistance Sitting-balance support: Feet supported;Single extremity supported Sitting balance-Leahy Scale: Good     Standing balance support: During functional activity;No upper extremity supported Standing balance-Leahy Scale: Fair Standing balance comment: Pt able to stand at sink and wash hands w/o either UE supported                    Cognition Arousal/Alertness: Awake/alert Behavior During Therapy: WFL for tasks assessed/performed Overall Cognitive Status: Within Functional Limits for tasks assessed                      Exercises Total Joint Exercises Quad Sets: AROM;Both;5 reps;Seated Long Arc Quad: AAROM;Left;5 reps;Seated Knee Flexion: AROM;Left;5 reps;Seated Goniometric ROM: 5-76    General Comments        Pertinent Vitals/Pain Pain Assessment: 0-10 Pain Score: 2  Pain Location: L knee Pain Descriptors / Indicators: Sore Pain Intervention(s): Limited activity within patient's tolerance;Monitored during session;Repositioned    Home Living Family/patient expects to be discharged to:: Private residence Living Arrangements: Spouse/significant other Available Help at Discharge: Family;Available 24 hours/day Type of Home: House Home Access: Stairs to enter Entrance Stairs-Rails: None Home Layout: Two level Home Equipment: Environmental consultant - 2 wheels;Cane - single point;Bedside commode;Crutches Additional Comments: Plans to sponge bathe until she can go flight of stairs. Last knee surgery she stood  with spouse assit and stepped over tub to use 3n1 facing showerhead.    Prior Function Level of Independence: Independent with assistive  device(s)      Comments: SPC w/ long distance ambulation PTA   PT Goals (current goals can now be found in the care plan section) Acute Rehab PT Goals Patient Stated Goal: to get stronger and go home PT Goal Formulation: With patient Time For Goal Achievement: 04/10/15 Potential to Achieve Goals: Good Progress towards PT goals: Progressing toward goals    Frequency  7X/week    PT Plan Current plan remains appropriate    Co-evaluation             End of Session Equipment Utilized During Treatment: Gait belt Activity Tolerance: Patient limited by fatigue;Patient tolerated treatment well Patient left: in chair;with call bell/phone within reach     Time: 0956-1032 PT Time Calculation (min) (ACUTE ONLY): 36 min  Charges:  $Gait Training: 8-22 mins $Therapeutic Exercise: 8-22 mins                    G Codes:      Joslyn Hy PT, DPT [phone] Pager: [phone] 04/04/2015, 11:22 AM

## 2015-04-05 LAB — BASIC METABOLIC PANEL
Anion gap: 7 (ref 5–15)
BUN: 12 mg/dL (ref 6–20)
CO2: 29 mmol/L (ref 22–32)
Calcium: 8.2 mg/dL — ABNORMAL LOW (ref 8.9–10.3)
Chloride: 98 mmol/L — ABNORMAL LOW (ref 101–111)
Creatinine, Ser: 0.61 mg/dL (ref 0.44–1.00)
GFR calc Af Amer: >60 mL/min (ref >60)
GFR calc non Af Amer: >60 mL/min (ref >60)
GLUCOSE: 132 mg/dL — AB (ref 65–99)
POTASSIUM: 3.2 mmol/L — AB (ref 3.5–5.1)
Sodium: 134 mmol/L — ABNORMAL LOW (ref 135–145)

## 2015-04-05 LAB — CBC
HCT: 25.4 % — ABNORMAL LOW (ref 36.0–46.0)
HEMOGLOBIN: 8.6 g/dL — AB (ref 12.0–15.0)
MCH: 31 pg (ref 26.0–34.0)
MCHC: 33.9 g/dL (ref 30.0–36.0)
MCV: 91.7 fL (ref 78.0–100.0)
Platelets: 213 10*3/uL (ref 150–400)
RBC: 2.77 MIL/uL — AB (ref 3.87–5.11)
RDW: 12.5 % (ref 11.5–15.5)
WBC: 7.6 10*3/uL (ref 4.0–10.5)

## 2015-04-05 MED ORDER — RIVAROXABAN 10 MG PO TABS: 10.0000 mg | ORAL_TABLET | Freq: Every day | ORAL | Status: DC

## 2015-04-05 MED ORDER — OXYCODONE HCL 5 MG PO TABS: 5.0000 mg | ORAL_TABLET | ORAL | Status: DC | PRN

## 2015-04-05 MED ORDER — METHOCARBAMOL 500 MG PO TABS: 500.0000 mg | ORAL_TABLET | Freq: Three times a day (TID) | ORAL | Status: DC | PRN

## 2015-04-05 NOTE — Progress Notes (Signed)
Physical Therapy Treatment Patient Details Name: Sharon [name] MRN: [id number] DOB: [birth year] Today's Date: 04/05/2015    History of Present Illness Pt is a 64 y/o F s/p L TKA.  Pt's PMH includes HTN, HA, and arthritis.    PT Comments    Pt demonstrated step through gait this session w/ min VCs and continues to show progress w/ ambulation and therapeutic exercises.  Patient is making good progress with PT.  From a mobility standpoint anticipate patient will be ready for DC home today.  Pt will benefit from continued skilled PT services to increase functional independence and safety.   Follow Up Recommendations  Home health PT;Supervision for mobility/OOB     Equipment Recommendations  None recommended by PT    Recommendations for Other Services       Precautions / Restrictions Precautions Precautions: Fall;Knee Precaution Comments: Reviewed no pillow under knee and WB status Restrictions Weight Bearing Restrictions: Yes LLE Weight Bearing: Partial weight bearing LLE Partial Weight Bearing Percentage or Pounds: 50    Mobility  Bed Mobility Overal bed mobility: Modified Independent Bed Mobility: Supine to Sit     Supine to sit: Modified independent (Device/Increase time)     General bed mobility comments: Inc time.  Pt demonstrates leg hook technique.  Transfers Overall transfer level: Needs assistance Equipment used: Rolling walker (2 wheeled) Transfers: Sit to/from Stand Sit to Stand: Supervision         General transfer comment: Supervison for safety  Ambulation/Gait Ambulation/Gait assistance: Supervision Ambulation Distance (Feet): 120 Feet Assistive device: Rolling walker (2 wheeled) Gait Pattern/deviations: Step-to pattern;Step-through pattern;Antalgic   Gait velocity interpretation: Below normal speed for age/gender General Gait Details: Pt demonstrated step through gait w/ min VCs.  Dec gait speed.     Stairs            Wheelchair  Mobility    Modified Rankin (Stroke Patients Only)       Balance Overall balance assessment: Needs assistance Sitting-balance support: No upper extremity supported;Feet supported Sitting balance-Leahy Scale: Good     Standing balance support: During functional activity;Bilateral upper extremity supported Standing balance-Leahy Scale: Fair                      Cognition Arousal/Alertness: Awake/alert Behavior During Therapy: WFL for tasks assessed/performed Overall Cognitive Status: Within Functional Limits for tasks assessed                      Exercises Total Joint Exercises Long Arc Quad: AAROM;Left;5 reps;Seated Knee Flexion: AROM;Left;5 reps;Seated Goniometric ROM: 91 deg L knee flexion    General Comments        Pertinent Vitals/Pain Pain Assessment: 0-10 Pain Score: 4  Pain Location: L knee Pain Descriptors / Indicators: Aching Pain Intervention(s): Limited activity within patient's tolerance;Monitored during session;Repositioned;Ice applied    Home Living                      Prior Function            PT Goals (current goals can now be found in the care plan section) Acute Rehab PT Goals Patient Stated Goal: to get stronger and go home PT Goal Formulation: With patient/family Time For Goal Achievement: 04/10/15 Potential to Achieve Goals: Good Progress towards PT goals: Progressing toward goals    Frequency  7X/week    PT Plan Current plan remains appropriate    Co-evaluation  End of Session Equipment Utilized During Treatment: Gait belt Activity Tolerance: Patient tolerated treatment well Patient left: in chair;with call bell/phone within reach;with family/visitor present     Time: 3967-2897 PT Time Calculation (min) (ACUTE ONLY): 24 min  Charges:  $Gait Training: 8-22 mins $Therapeutic Exercise: 8-22 mins                    G Codes:      Joslyn Hy PT, [place] [phone] Pager:  [phone] 04/05/2015, 12:16 PM

## 2015-04-05 NOTE — Discharge Summary (Signed)
Sharon Fears, MD   Biagio Borg, PA-C [address], [place], [place]  [address]                             [phone]  PATIENT ID: Sharon [name]        MRN:  [id number]          DOB/AGE: [birth year] / 64 y.o.    DISCHARGE SUMMARY  ADMISSION DATE:    04/03/2015 DISCHARGE DATE:   04/05/2015   ADMISSION DIAGNOSIS: LEFT KNEE END STAGE OSTEOARTHRITIS    DISCHARGE DIAGNOSIS:  LEFT KNEE END STAGE OSTEOARTHRITIS    ADDITIONAL DIAGNOSIS: Principal Problem:   Osteoarthritis of left knee Active Problems:   S/P total knee replacement using cement  Past Medical History  Diagnosis Date  . Hypertension   . Fibroid   . Headache(784.0)   . Arthritis     Osteoarthritis  . Vitamin D deficiency   . Ringworm     PROCEDURE: Procedure(s): TOTAL KNEE ARTHROPLASTY Left on 04/03/2015  CONSULTS: none     HISTORY: Sharon [name] is a very pleasant 64 year old white female who is seen today for evaluation of her left knee. We have seen her for many years in regard to her left knee. She has had left knee pain and actually has had an arthroscopic debridement of the left knee in February 2015 which was removal of loose bodies, synovectomy, and plicectomy as well as chondroplasty. She continues to have pain and discomfort in the knee despite conservative treatment. We started her on anti-inflammatories as well ascorticosteroid injections as well asviscosupplementation. She finished her last series of Euflexxa in January 2016 and unfortunately continues to have pain in the left knee. The viscosupplementation had a little bit of help but more aching at nighttime but not a fully constant pain, but it is certainly now interfering with her activities of daily living and resting at nighttime. She feels that the knee is becoming a little more unstable, and she is afraid of falling at times. She has had a right total knee arthroplasty and has done exceedingly well with this in the past  HOSPITAL  COURSE:  Sharon [name] is a 64 y.o. admitted on 04/03/2015 and found to have a diagnosis of [place].  After appropriate laboratory studies were obtained  they were taken to the operating room on 04/03/2015 and underwent  Procedure(s): TOTAL KNEE ARTHROPLASTY  Left.   They were given perioperative antibiotics:  Anti-infectives    Start     Dose/Rate Route Frequency Ordered Stop   04/03/15 1900  vancomycin (VANCOCIN) IVPB 1000 mg/200 mL premix     1,000 mg 200 mL/hr over 60 Minutes Intravenous Every 12 hours 04/03/15 1000 04/04/15 0856   04/03/15 0556  vancomycin (VANCOCIN) IVPB 1000 mg/200 mL premix     1,000 mg 200 mL/hr over 60 Minutes Intravenous On call to O.R. 04/03/15 4696 04/03/15 2952    .  Tolerated the procedure well.  Placed with a foley intraoperatively.     Toradol was given post op.  POD #1, allowed out of bed to a chair.  PT for ambulation and exercise program.  Foley D/C'd in morning.  IV saline locked.  O2 discontionued. Hemovac pulled.  POD #2, continued PT and ambulation.     The remainder of the hospital course was dedicated to ambulation and strengthening.   The patient was discharged on 2 Days Post-Op in  Stable  condition.  Blood products given:none  DIAGNOSTIC STUDIES: Recent vital signs: Patient Vitals for the past 24 hrs:  BP Temp Temp src Pulse Resp SpO2  04/05/15 1040 (!) 154/85 mmHg - - - - -  04/05/15 0504 (!) 154/85 mmHg 99.8 F (37.7 C) - (!) 107 16 97 %  04/05/15 0014 - 99.3 F (37.4 C) Oral - - -  04/04/15 1946 (!) 154/79 mmHg 100 F (37.8 C) - 92 16 96 %       Recent laboratory studies:  Recent Labs  04/04/15 0505 04/05/15 0625  WBC 12.0* 7.6  HGB 9.2* 8.6*  HCT 27.2* 25.4*  PLT 228 213    Recent Labs  04/04/15 0505 04/05/15 0625  NA 135 134*  K 3.5 3.2*  CL 102 98*  CO2 27 29  BUN 11 12  CREATININE 0.69 0.61  GLUCOSE 138* 132*  CALCIUM 8.5* 8.2*   Lab Results  Component Value Date   INR 1.03 03/22/2015    INR 0.92 04/16/2011     Recent Radiographic Studies :  Dg Chest 2 View  03/23/2015   CLINICAL DATA:  64 year old female with a history thinning knee replacement. Preop study.  EXAM: CHEST - 2 VIEW  COMPARISON:  04/16/2011  FINDINGS: Cardiomediastinal silhouette projects within normal limits in size and contour. No confluent airspace disease, pneumothorax, or pleural effusion.  Surgical changes of the right axillary region.  No displaced fracture.  Unremarkable appearance of the upper abdomen.  The azygos lobe.  IMPRESSION: No radiographic evidence of acute cardiopulmonary disease.  Signed,  Dulcy Fanny. Earleen Newport, DO  [facility]  [facility]   Electronically Signed   By: Corrie Mckusick D.O.   On: 03/23/2015 07:49    DISCHARGE INSTRUCTIONS: Discharge Instructions    CPM    Complete by:  As directed   Continuous passive motion machine (CPM):      Use the CPM from 0 to 60 degrees for 6-8 hours per day.      You may increase by 5-10 per day.  You may break it up into 2 or 3 sessions per day.      Use CPM for 3-4 weeks or until you are told to stop.     Call MD / Call 911    Complete by:  As directed   If you experience chest pain or shortness of breath, CALL 911 and be transported to the hospital emergency room.  If you develope a fever above 101 F, pus (white drainage) or increased drainage or redness at the wound, or calf pain, call your surgeon's office.     Change dressing    Complete by:  As directed   DO NOT CHANGE YOUR DRESSING.     Constipation Prevention    Complete by:  As directed   Drink plenty of fluids.  Prune juice may be helpful.  You may use a stool softener, such as Colace (over the counter) 100 mg twice a day.  Use MiraLax (over the counter) for constipation as needed.     Diet general    Complete by:  As directed      Discharge instructions    Complete by:  As directed   Marvell items at home  which could result in a fall. This includes throw rugs or furniture in walking pathways ICE to the affected joint every three hours while awake for 30 minutes at a time, for at least  the first 3-5 days, and then as needed for pain and swelling.  Continue to use ice for pain and swelling. You may notice swelling that will progress down to the foot and ankle.  This is normal after surgery.  Elevate your leg when you are not up walking on it.   Continue to use the breathing machine you got in the hospital (incentive spirometer) which will help keep your temperature down.  It is common for your temperature to cycle up and down following surgery, especially at night when you are not up moving around and exerting yourself.  The breathing machine keeps your lungs expanded and your temperature down.   DIET:  As you were doing prior to hospitalization, we recommend a well-balanced diet.  DRESSING / WOUND CARE / SHOWERING  Keep the surgical dressing until follow up.  The dressing is water proof, so you can shower without any extra covering.  IF THE DRESSING FALLS OFF or the wound gets wet inside, change the dressing with sterile gauze.  Please use good hand washing techniques before changing the dressing.  Do not use any lotions or creams on the incision until instructed by your surgeon.    ACTIVITY  Increase activity slowly as tolerated, but follow the weight bearing instructions below.   No driving for 6 weeks or until further direction given by your physician.  You cannot drive while taking narcotics.  No lifting or carrying greater than 10 lbs. until further directed by your surgeon. Avoid periods of inactivity such as sitting longer than an hour when not asleep. This helps prevent blood clots.  You may return to work once you are authorized by your doctor.     WEIGHT BEARING   Partial weight bearing with assist device as directed.  50%  EXERCISES  Results after joint replacement surgery are  often greatly improved when you follow the exercise, range of motion and muscle strengthening exercises prescribed by your doctor. Safety measures are also important to protect the joint from further injury. Any time any of these exercises cause you to have increased pain or swelling, decrease what you are doing until you are comfortable again and then slowly increase them. If you have problems or questions, call your caregiver or physical therapist for advice.   Rehabilitation is important following a joint replacement. After just a few days of immobilization, the muscles of the leg can become weakened and shrink (atrophy).  These exercises are designed to build up the tone and strength of the thigh and leg muscles and to improve motion. Often times heat used for twenty to thirty minutes before working out will loosen up your tissues and help with improving the range of motion but do not use heat for the first two weeks following surgery (sometimes heat can increase post-operative swelling).   These exercises can be done on a training (exercise) mat, on a table or on a bed. Use whatever works the best and is most comfortable for you.    Use music or television while you are exercising so that the exercises are a pleasant break in your day. This will make your life better with the exercises acting as a break in your routine that you can look forward to.   Perform all exercises about fifteen times, three times per day or as directed.  You should exercise both the operative leg and the other leg as well.   Exercises include:   Quad Sets - Tighten up the muscle on  the front of the thigh (Quad) and hold for 5-10 seconds.   Straight Leg Raises - With your knee straight (if you were given a brace, keep it on), lift the leg to 60 degrees, hold for 3 seconds, and slowly lower the leg.  Perform this exercise against resistance later as your leg gets stronger.  Leg Slides: Lying on your back, slowly slide your foot  toward your buttocks, bending your knee up off the floor (only go as far as is comfortable). Then slowly slide your foot back down until your leg is flat on the floor again.  Angel Wings: Lying on your back spread your legs to the side as far apart as you can without causing discomfort.  Hamstring Strength:  Lying on your back, push your heel against the floor with your leg straight by tightening up the muscles of your buttocks.  Repeat, but this time bend your knee to a comfortable angle, and push your heel against the floor.  You may put a pillow under the heel to make it more comfortable if necessary.   A rehabilitation program following joint replacement surgery can speed recovery and prevent re-injury in the future due to weakened muscles. Contact your doctor or a physical therapist for more information on knee rehabilitation.    CONSTIPATION  Constipation is defined medically as fewer than three stools per week and severe constipation as less than one stool per week.  Even if you have a regular bowel pattern at home, your normal regimen is likely to be disrupted due to multiple reasons following surgery.  Combination of anesthesia, postoperative narcotics, change in appetite and fluid intake all can affect your bowels.   YOU MUST use at least one of the following options; they are listed in order of increasing strength to get the job done.  They are all available over the counter, and you may need to use some, POSSIBLY even all of these options:    Drink plenty of fluids (prune juice may be helpful) and high fiber foods Colace 100 mg by mouth twice a day  Senokot for constipation as directed and as needed Dulcolax (bisacodyl), take with full glass of water  Miralax (polyethylene glycol) once or twice a day as needed.  If you have tried all these things and are unable to have a bowel movement in the first 3-4 days after surgery call either your surgeon or your primary doctor.    If you  experience loose stools or diarrhea, hold the medications until you stool forms back up.  If your symptoms do not get better within 1 week or if they get worse, check with your doctor.  If you experience "the worst abdominal pain ever" or develop nausea or vomiting, please contact the office immediately for further recommendations for treatment.   ITCHING:  If you experience itching with your medications, try taking only a single pain pill, or even half a pain pill at a time.  You can also use Benadryl over the counter for itching or also to help with sleep.   TED HOSE STOCKINGS:  Use stockings on both legs until for at least 2 weeks or as directed by physician office. They may be removed at night for sleeping.  MEDICATIONS:  See your medication summary on the "After Visit Summary" that nursing will review with you.  You may have some home medications which will be placed on hold until you complete the course of blood thinner medication.  It is important  for you to complete the blood thinner medication as prescribed.  PRECAUTIONS:  If you experience chest pain or shortness of breath - call 911 immediately for transfer to the hospital emergency department.   If you develop a fever greater that 101 F, purulent drainage from wound, increased redness or drainage from wound, foul odor from the wound/dressing, or calf pain - CONTACT YOUR SURGEON.                                                   FOLLOW-UP APPOINTMENTS:  If you do not already have a post-op appointment, please call the office for an appointment to be seen by your surgeon.  Guidelines for how soon to be seen are listed in your "After Visit Summary", but are typically between 1-4 weeks after surgery.  OTHER INSTRUCTIONS:   Knee Replacement:  Do not place pillow under knee, focus on keeping the knee straight while resting. CPM instructions: 0-90 degrees, 2 hours in the morning, 2 hours in the afternoon, and 2 hours in the evening. Place foam  block, curve side up under heel at all times except when in CPM or when walking.  DO NOT modify, tear, cut, or change the foam block in any way.  MAKE SURE YOU:  Understand these instructions.  Get help right away if you are not doing well or get worse.    Thank you for letting us be a part of your medical care team.  It is a privilege we respect greatly.  We hope these instructions will help you stay on track for a fast and full recovery!     Do not put a pillow under the knee. Place it under the heel.    Complete by:  As directed      Driving restrictions    Complete by:  As directed   No driving for 6 weeks     Increase activity slowly as tolerated    Complete by:  As directed      Lifting restrictions    Complete by:  As directed   No lifting for 6 weeks     Partial weight bearing    Complete by:  As directed   % Body Weight:  50%  Laterality:  left  Extremity:  Lower     Patient may shower    Complete by:  As directed   You may shower over your dressing     TED hose    Complete by:  As directed   Use stockings (TED hose) for 3 weeks on left  leg.  You may remove them at night for sleeping.           DISCHARGE MEDICATIONS:     Medication List    STOP taking these medications        ibuprofen 200 MG tablet  Commonly known as:  ADVIL,MOTRIN      TAKE these medications        amoxicillin 500 MG capsule  Commonly known as:  AMOXIL  Take 2,000 mg by mouth once. 4-500 mg capsules prior to dental procedures.     aspirin-acetaminophen-caffeine 176-160-73 MG per tablet  Commonly known as:  EXCEDRIN MIGRAINE  Take 1 tablet by mouth every 6 (six) hours as needed for headache.     fluticasone 50 MCG/ACT nasal spray  Commonly known  as:  FLONASE  Place 1 spray into both nostrils daily.     hydrochlorothiazide 25 MG tablet  Commonly known as:  HYDRODIURIL  Take 25 mg by mouth daily.     hydrocortisone 25 MG suppository  Commonly known as:  ANUSOL-HC  Place 1  suppository (25 mg total) rectally at bedtime as needed for hemorrhoids or itching.     methocarbamol 500 MG tablet  Commonly known as:  ROBAXIN  Take 1 tablet (500 mg total) by mouth every 8 (eight) hours as needed for muscle spasms.     oxyCODONE 5 MG immediate release tablet  Commonly known as:  Oxy IR/ROXICODONE  Take 1-2 tablets (5-10 mg total) by mouth every 4 (four) hours as needed for moderate pain or severe pain.     rivaroxaban 10 MG Tabs tablet  Commonly known as:  XARELTO  Take 1 tablet (10 mg total) by mouth daily with breakfast.     SUMAtriptan 6 MG/0.5ML Soln injection  Commonly known as:  IMITREX  Inject 6 mg into the skin every 2 (two) hours as needed for migraine or headache. May repeat in 2 hours if headache persists or recurs.     SYSTANE OP  Apply 1 drop to eye as needed (dry eyes).     venlafaxine XR 37.5 MG 24 hr capsule  Commonly known as:  EFFEXOR-XR  Take 1 capsule (37.5 mg total) by mouth daily.     verapamil 240 MG (CO) 24 hr tablet  Commonly known as:  COVERA HS  Take 240 mg by mouth 2 (two) times daily.     Vitamin D (Ergocalciferol) 50000 UNITS Caps capsule  Commonly known as:  DRISDOL  Take 1 capsule (50,000 Units total) by mouth every 7 (seven) days.        FOLLOW UP VISIT:       Follow-up Information    Follow up with [facility].   Why:  They will contact you to schedule home therapy visits.    Contact information:   [address] [place] [address] [phone]       Follow up with Garald Balding, MD. Schedule an appointment as soon as possible for a visit on 04/16/2015.   Specialty:  Orthopedic Surgery   Contact information:   [place]. [place] [address] [phone]       DISPOSITION:   Home  CONDITION:  Stable   Mike Craze. [place], [place] [phone]  04/05/2015 12:09 PM

## 2015-04-13 ENCOUNTER — Ambulatory Visit
Admission: RE | Admit: 2015-04-13 | Discharge: 2015-04-13 | Disposition: A | Discharge status: Home / Self Care | Payer: Commercial Managed Care - PPO | Source: Ambulatory Visit | Attending: Orthopaedic Surgery | Admitting: Orthopaedic Surgery

## 2015-04-13 ENCOUNTER — Other Ambulatory Visit: Payer: Self-pay | Admitting: Orthopaedic Surgery

## 2015-04-13 DIAGNOSIS — J189 Pneumonia, unspecified organism: Secondary | ICD-10-CM

## 2015-04-17 ENCOUNTER — Ambulatory Visit: Payer: BC Managed Care – PPO | Admitting: Gynecology

## 2015-07-17 ENCOUNTER — Telehealth: Payer: Self-pay | Admitting: Obstetrics and Gynecology

## 2015-07-17 NOTE — Telephone Encounter (Signed)
Left message regarding upcoming appointment has been canceled and needs to be rescheduled. °

## 2015-07-26 ENCOUNTER — Ambulatory Visit: Payer: Commercial Managed Care - PPO | Admitting: Obstetrics and Gynecology

## 2015-08-02 ENCOUNTER — Telehealth: Payer: Self-pay

## 2015-08-02 NOTE — Telephone Encounter (Signed)
rx refill came in for venlafaxine HCL ER 37.5mg  Medication refill request:  Last AEX:  04-14-14 Next AEX: pt had aex scheduled for 07-26-15 with dr Talbert Nan but office called her to cancel it & left a message for pt to call back to reschedule. I also called pt & left message letting her know to call to make aex appt & we received a refill request for her that we need her to schedule aex before it can be refilled. Last MMG (if hormonal medication request): last one in system is 05-19-14 neg. Need to see if pt has had other mammo Refill authorized: none authorized first

## 2015-08-24 ENCOUNTER — Other Ambulatory Visit: Payer: Self-pay | Admitting: Obstetrics and Gynecology

## 2015-08-24 NOTE — Telephone Encounter (Signed)
Medication refill request: Effexor  Last AEX:  04/14/14 TL Next AEX: 08/30/15 JJ Last MMG (if hormonal medication request): 05/19/14 BIRADS1:neg Refill authorized: 06/28/14 #[id number]/[id number]. Today please advise.

## 2015-08-30 ENCOUNTER — Encounter: Payer: Self-pay | Admitting: Obstetrics and Gynecology

## 2015-08-30 ENCOUNTER — Ambulatory Visit (INDEPENDENT_AMBULATORY_CARE_PROVIDER_SITE_OTHER): Payer: Commercial Managed Care - PPO | Admitting: Obstetrics and Gynecology

## 2015-08-30 VITALS — BP 140/80 | HR 68 | Resp 16 | Ht 66.0 in | Wt 176.0 lb

## 2015-08-30 DIAGNOSIS — Z8049 Family history of malignant neoplasm of other genital organs: Secondary | ICD-10-CM

## 2015-08-30 DIAGNOSIS — Z01419 Encounter for gynecological examination (general) (routine) without abnormal findings: Secondary | ICD-10-CM

## 2015-08-30 DIAGNOSIS — Z8041 Family history of malignant neoplasm of ovary: Secondary | ICD-10-CM | POA: Diagnosis not present

## 2015-08-30 DIAGNOSIS — E559 Vitamin D deficiency, unspecified: Secondary | ICD-10-CM | POA: Diagnosis not present

## 2015-08-30 DIAGNOSIS — Z800 Family history of malignant neoplasm of digestive organs: Secondary | ICD-10-CM | POA: Diagnosis not present

## 2015-08-30 MED ORDER — VENLAFAXINE HCL ER 37.5 MG PO CP24: 37.5000 mg | ORAL_CAPSULE | Freq: Every day | ORAL | Status: DC

## 2015-08-30 NOTE — Patient Instructions (Signed)

## 2015-08-30 NOTE — Progress Notes (Signed)
64 y.o. [id number] MarriedCaucasianF here for annual exam.  H/O TAH/BSO. She has a family h/o ovarian cancer (mom, MGM, and cousin). The patient has had negative BRCA testing, no one with the ovarian cancer was tested. No vaginal bleeding. Sexually active, infrequent, no pain.  H/O vit D Deficiency. Hasn't taken her vit d for the last few months.  She is on effexor for vasomotor symptoms, helping.     Patient's last menstrual period was 02/17/1989.          Sexually active: Yes.    The current method of family planning is post menopausal status.    Exercising: Yes.    [place], aerobics  Smoker:  no  Health Maintenance: Pap:  01/28/2012 Neg History of abnormal Pap:  no MMG: 05/19/14 BIRADS1:Neg. Had one on 07/2015 normal  Colonoscopy:  3/16 BMD:   08/2009 Normal  TDaP: UTD w/ PCP   reports that she has never smoked. She has never used smokeless tobacco. She reports that she drinks about 1.5 oz of alcohol per week. She reports that she does not use illicit drugs.She has 2 sons, one is gay. No grandchildren.  She is a Oncologist. Plans to retire.   Past Medical History  Diagnosis Date  . Hypertension   . Fibroid   . Headache(784.0)   . Arthritis     Osteoarthritis  . Vitamin D deficiency   . Ringworm     Past Surgical History  Procedure Laterality Date  . Abdominal hysterectomy  1990    TAH BSO  . Appendectomy  1969  . Tonsillectomy    . Foot surgery      bunionectomy  . Knee surgery  11/2013    arthroscopic L knee  . Precancerous lesion excised    . Oophorectomy      BSO  . Total knee arthroplasty  2012  . Total knee arthroplasty Left 04/03/2015    Procedure: TOTAL KNEE ARTHROPLASTY;  Surgeon: Garald Balding, MD;  Location: [facility];  Service: Orthopedics;  Laterality: Left;    Current Outpatient Prescriptions  Medication Sig Dispense Refill  . aspirin-acetaminophen-caffeine (EXCEDRIN MIGRAINE) 250-250-65 MG per tablet Take 1 tablet by mouth every 6 (six) hours as  needed for headache.    . fluticasone (FLONASE) 50 MCG/ACT nasal spray Place 1 spray into both nostrils daily.    . hydrochlorothiazide (HYDRODIURIL) 25 MG tablet Take 25 mg by mouth daily.    . methocarbamol (ROBAXIN) 500 MG tablet Take 1 tablet (500 mg total) by mouth every 8 (eight) hours as needed for muscle spasms. 40 tablet 0  . Polyethyl Glycol-Propyl Glycol (SYSTANE OP) Apply 1 drop to eye as needed (dry eyes).    . venlafaxine XR (EFFEXOR-XR) 37.5 MG 24 hr capsule take 1 capsule by mouth once daily 30 capsule 0  . verapamil (COVERA HS) 240 MG (CO) 24 hr tablet Take 240 mg by mouth 2 (two) times daily.     Marland Kitchen amoxicillin (AMOXIL) 500 MG capsule Take 2,000 mg by mouth once. 4-500 mg capsules prior to dental procedures.    . hydrocortisone (ANUSOL-HC) 25 MG suppository Place 1 suppository (25 mg total) rectally at bedtime as needed for hemorrhoids or itching. (Patient not taking: Reported on 02/16/2015) 12 suppository 1  . SUMAtriptan (IMITREX) 6 MG/0.5ML SOLN injection Inject 6 mg into the skin every 2 (two) hours as needed for migraine or headache. May repeat in 2 hours if headache persists or recurs.     No current facility-administered medications  for this visit.    Family History  Problem Relation Age of Onset  . Ovarian cancer Mother   . Cancer Mother     OVARIAN  . Colon cancer Maternal Aunt   . Diabetes Paternal Aunt   . Ovarian cancer Maternal Grandmother   . Cancer Maternal Grandmother     OVARIAN AND UTERINE  . Heart disease Maternal Grandfather   . Hypertension Paternal Grandmother   . Ovarian cancer Cousin   . Cancer Cousin     OVARIAN  . Heart disease Father   . Heart disease Brother   . Heart disease Brother     Review of Systems  Cardiovascular: Positive for leg swelling.  Genitourinary:       Loss of urine with sneeze or cough (on occasion)  Skin:       Hair loss New or changed mole/lump  Neurological: Positive for weakness.  Hematological:  Bruises/bleeds easily.  All other systems reviewed and are negative.   Exam:   BP 140/80 mmHg  Pulse 68  Resp 16  Ht _0  (1.676 m)  Wt 176 lb (79.833 kg)  BMI 28.42 kg/m2  LMP 02/17/1989  Weight change: _1 @ Height:   Height: _2  (167.6 cm)  Ht Readings from Last 3 Encounters:  08/30/15 _3  (1.676 m)  04/03/15 _4  (1.702 m)  03/22/15 _5  (1.702 m)    General appearance: alert, cooperative and appears stated age Head: Normocephalic, without obvious abnormality, atraumatic Neck: no adenopathy, supple, symmetrical, trachea midline and thyroid normal to inspection and palpation Lungs: clear to auscultation bilaterally Breasts: normal appearance, no masses or tenderness Heart: regular rate and rhythm Abdomen: soft, non-tender; bowel sounds normal; no masses,  no organomegaly Extremities: extremities normal, atraumatic, no cyanosis or edema Skin: Skin color, texture, turgor normal. No rashes or lesions Lymph nodes: Cervical, supraclavicular, and axillary nodes normal. No abnormal inguinal nodes palpated Neurologic: Grossly normal   Pelvic: External genitalia:  no lesions              Urethra:  normal appearing urethra with no masses, tenderness or lesions              Bartholins and Skenes: normal                 Vagina: normal appearing vagina with normal color and discharge, no lesions              Cervix: absent               Bimanual Exam:  Uterus:  uterus absent              Adnexa: no mass, fullness, tenderness               Rectovaginal: Confirms               Anus:  normal sphincter tone, no lesions  Chaperone was present for exam.  A:  Well Woman with normal exam  Strong FH of ovarian cancer, also colon and uterine cancer  Vit D def  Vasomotor symptoms, improved on effexor  P:   No pap  Mammogram, normal UTD  Colonoscopy UTD  Send to genetics  Continue effexor  Check vit d level

## 2015-08-31 ENCOUNTER — Other Ambulatory Visit: Payer: Self-pay | Admitting: Obstetrics and Gynecology

## 2015-08-31 DIAGNOSIS — E559 Vitamin D deficiency, unspecified: Secondary | ICD-10-CM

## 2015-08-31 LAB — VITAMIN D 25 HYDROXY (VIT D DEFICIENCY, FRACTURES): Vit D, 25-Hydroxy: 19 ng/mL — ABNORMAL LOW (ref 30–100)

## 2015-09-03 ENCOUNTER — Telehealth: Payer: Self-pay | Admitting: *Deleted

## 2015-09-03 ENCOUNTER — Encounter: Payer: Self-pay | Admitting: *Deleted

## 2015-09-03 NOTE — Telephone Encounter (Signed)
Patient aware - see result note -eh 

## 2015-09-03 NOTE — Telephone Encounter (Signed)
-----   Message from Salvadore Dom, MD sent at 08/31/2015  9:11 AM EST ----- Please inform the patient of her low vit d level. She has needed high amounts of oral vit d in the past. Please ask her to start taking 2,000 IU of vit d a day and make her a 3 month appointment for a lab visit. I will order another vit D level. Thanks

## 2015-09-03 NOTE — Telephone Encounter (Signed)
09-03-15 Hermosa in regards to lab results -eh

## 2015-09-04 ENCOUNTER — Telehealth: Payer: Self-pay | Admitting: Genetic Counselor

## 2015-09-04 NOTE — Telephone Encounter (Signed)
Lt mess regarding genetic counseling referral °

## 2015-09-17 ENCOUNTER — Telehealth: Payer: Self-pay | Admitting: Obstetrics and Gynecology

## 2015-09-17 NOTE — Telephone Encounter (Signed)
Call to patient regarding attempt to schedule for genetics. Left voicemail.

## 2015-09-20 NOTE — Telephone Encounter (Signed)
I think it's up to the patient to call back if she wants to proceed. You can close the encounter. Thanks for your efforts.

## 2015-09-20 NOTE — Telephone Encounter (Signed)
Call to patient regarding attempts to schedule for genetics. Left voicemail.  Genetics has made several attempts as well as referrals. Please advise next step with referral.  Routing to provider for review.

## 2015-09-20 NOTE — Telephone Encounter (Signed)
Referral closed per Dr Talbert Nan approval. Ok to close encounter

## 2016-04-28 DIAGNOSIS — I10 Essential (primary) hypertension: Secondary | ICD-10-CM | POA: Diagnosis not present

## 2016-08-12 DIAGNOSIS — I10 Essential (primary) hypertension: Secondary | ICD-10-CM | POA: Diagnosis not present

## 2016-08-26 DIAGNOSIS — Z1231 Encounter for screening mammogram for malignant neoplasm of breast: Secondary | ICD-10-CM | POA: Diagnosis not present

## 2016-08-27 DIAGNOSIS — L03113 Cellulitis of right upper limb: Secondary | ICD-10-CM | POA: Diagnosis not present

## 2016-08-29 DIAGNOSIS — L03119 Cellulitis of unspecified part of limb: Secondary | ICD-10-CM | POA: Diagnosis not present

## 2016-09-04 ENCOUNTER — Telehealth: Payer: Self-pay | Admitting: Obstetrics and Gynecology

## 2016-09-04 ENCOUNTER — Encounter: Payer: Self-pay | Admitting: Obstetrics and Gynecology

## 2016-09-04 ENCOUNTER — Ambulatory Visit (INDEPENDENT_AMBULATORY_CARE_PROVIDER_SITE_OTHER): Payer: PPO | Admitting: Obstetrics and Gynecology

## 2016-09-04 VITALS — BP 132/78 | HR 72 | Resp 15 | Ht 66.5 in | Wt 186.0 lb

## 2016-09-04 DIAGNOSIS — Z780 Asymptomatic menopausal state: Secondary | ICD-10-CM

## 2016-09-04 DIAGNOSIS — Z13820 Encounter for screening for osteoporosis: Secondary | ICD-10-CM | POA: Diagnosis not present

## 2016-09-04 DIAGNOSIS — Z01419 Encounter for gynecological examination (general) (routine) without abnormal findings: Secondary | ICD-10-CM

## 2016-09-04 DIAGNOSIS — Z8041 Family history of malignant neoplasm of ovary: Secondary | ICD-10-CM

## 2016-09-04 MED ORDER — VENLAFAXINE HCL ER 37.5 MG PO CP24: 37.5000 mg | ORAL_CAPSULE | Freq: Every day | ORAL | 3 refills | Status: DC

## 2016-09-04 NOTE — Patient Instructions (Signed)

## 2016-09-04 NOTE — Telephone Encounter (Signed)
Dr. Talbert Nan, forwarding patient message FYI. OK to close encounter?

## 2016-09-04 NOTE — Telephone Encounter (Signed)
Patient called to give date of genetic testing with Dr. Charlies Constable. She said, "It was done on 05/23/13 according to my receipt from the insurance company." Patient seen earlier today.

## 2016-09-04 NOTE — Progress Notes (Signed)
65 y.o. [id number] MarriedCaucasianF here for annual exam.  H/O TAH/BSO. Family history of ovarian cancer, she has had negative BRCA testing. On Effexor for vasomotor symptoms. Feels mood is muted by the medication.  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 Dad is 53 in [place], lives alone, son nearby.  She has gained 10 lbs in the last year. Eating healthy and exercising. Will cut back on carbs.     Patient's last menstrual period was 02/17/1989.          Sexually active: Yes.    The current method of family planning is status post hysterectomy.    Exercising: Yes.    water Aero/ walking Smoker:  no  Health Maintenance: Pap:  2013 WNL  History of abnormal Pap:  no MMG:  07-12-15 WNL, just had it, normal.  Colonoscopy:  07-21-14 WNL BMD:   08-21-09 WNL TDaP:  2016  Gardasil: N/A   reports that she has never smoked. She has never used smokeless tobacco. She reports that she drinks about 1.5 oz of alcohol per week . She reports that she does not use drugs. She has 2 sons, one is gay, lives in [place]. Other son is working in [place]. No grandchildren.  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   Past Medical History:  Diagnosis Date  . Arthritis    Osteoarthritis  . Fibroid   . Headache(784.0)   . Hypertension   . Ringworm   . Vitamin D deficiency     Past Surgical History:  Procedure Laterality Date  . ABDOMINAL HYSTERECTOMY  1990   TAH BSO  . APPENDECTOMY  1969  . FOOT SURGERY     bunionectomy  . KNEE SURGERY  11/2013   arthroscopic L knee  . OOPHORECTOMY     BSO  . Precancerous lesion excised    . TONSILLECTOMY    . TOTAL KNEE ARTHROPLASTY  2012  . TOTAL KNEE ARTHROPLASTY Left 04/03/2015   Procedure: TOTAL KNEE ARTHROPLASTY;  Surgeon: Garald Balding, MD;  Location: [facility];  Service: Orthopedics;  Laterality: Left;    Current Outpatient Prescriptions  Medication Sig Dispense Refill  . amoxicillin (AMOXIL) 500 MG capsule Take 2,000 mg by mouth once. 4-500 mg  capsules prior to dental procedures.    Marland Kitchen aspirin-acetaminophen-caffeine (EXCEDRIN MIGRAINE) 250-250-65 MG per tablet Take 1 tablet by mouth every 6 (six) hours as needed for headache.    . cycloSPORINE (RESTASIS) 0.05 % ophthalmic emulsion 1 drop 2 (two) times daily.    . fluticasone (FLONASE) 50 MCG/ACT nasal spray Place 1 spray into both nostrils daily.    . hydrochlorothiazide (HYDRODIURIL) 25 MG tablet Take 25 mg by mouth daily.    . methocarbamol (ROBAXIN) 500 MG tablet Take 1 tablet (500 mg total) by mouth every 8 (eight) hours as needed for muscle spasms. 40 tablet 0  . SUMAtriptan Succinate (IMITREX PO) Take by mouth.    . venlafaxine XR (EFFEXOR-XR) 37.5 MG 24 hr capsule Take 1 capsule (37.5 mg total) by mouth daily. 90 capsule 3  . verapamil (COVERA HS) 240 MG (CO) 24 hr tablet Take 240 mg by mouth 2 (two) times daily.      No current facility-administered medications for this visit.     Family History  Problem Relation Age of Onset  . Ovarian cancer Mother   . Cancer Mother     OVARIAN  . Colon cancer Maternal Aunt   . Diabetes Paternal Aunt   . Ovarian cancer Maternal  Grandmother   . Cancer Maternal Grandmother     OVARIAN AND UTERINE  . Heart disease Maternal Grandfather   . Hypertension Paternal Grandmother   . Ovarian cancer Cousin   . Cancer Cousin     OVARIAN  . Heart disease Father   . Heart disease Brother   . Heart disease Brother   . Stomach cancer Maternal Uncle     Review of Systems  Constitutional: Positive for unexpected weight change.       Weight gain   HENT: Negative.   Eyes: Negative.   Respiratory: Negative.   Cardiovascular: Negative.   Gastrointestinal: Negative.        Bloating  Endocrine: Negative.   Genitourinary: Negative.   Musculoskeletal: Negative.   Skin: Negative.   Allergic/Immunologic: Negative.   Neurological: Negative.   Psychiatric/Behavioral: Negative.     Exam:   BP 132/78 (BP Location: Right Arm, Patient Position:  Sitting, Cuff Size: Normal)   Pulse 72   Resp 15   Ht 5' 6.5" (1.689 m)   Wt 186 lb (84.4 kg)   LMP 02/17/1989   BMI 29.57 kg/m   Weight change: '@WEIGHTCHANGE' @ Height:   Height: 5' 6.5" (168.9 cm)  Ht Readings from Last 3 Encounters:  09/04/16 5' 6.5" (1.689 m)  08/30/15 '5\' 6"'  (1.676 m)  04/03/15 '5\' 7"'  (1.702 m)    General appearance: alert, cooperative and appears stated age Head: Normocephalic, without obvious abnormality, atraumatic Neck: no adenopathy, supple, symmetrical, trachea midline and thyroid normal to inspection and palpation Lungs: clear to auscultation bilaterally Breasts: normal appearance, no masses or tenderness Heart: regular rate and rhythm Abdomen: soft, non-tender; bowel sounds normal; no masses,  no organomegaly Extremities: extremities normal, atraumatic, no cyanosis or edema Skin: Skin color, texture, turgor normal. No rashes or lesions Lymph nodes: Cervical, supraclavicular, and axillary nodes normal. No abnormal inguinal nodes palpated Neurologic: Grossly normal   Pelvic: External genitalia:  no lesions              Urethra:  normal appearing urethra with no masses, tenderness or lesions              Bartholins and Skenes: normal                 Vagina: normal appearing atrophic vagina with normal color and discharge, no lesions              Cervix: absent               Bimanual Exam:  Uterus: absent              Adnexa: no mass, fullness, tenderness               Rectovaginal: Confirms               Anus:  normal sphincter tone, no lesions  Chaperone was present for exam.  A:  Well Woman with normal exam  On Effexor for vasomotor symptoms, she thinks she will try and wean off  P:   Mammogram just done  DEXA due, script given  Labs and immunizations with primary MD  Colonoscopy UTD  Discussed breast self exam  Discussed calcium and vit D intake  No paps needed

## 2016-09-04 NOTE — Telephone Encounter (Signed)
Please let the patient know that I found the report and she was only tested for the BRCA gene, I think she should see a genetics counselor for evaluation of her strong family history of ovarian cancer.  Please let her know and set up the referral.

## 2016-09-05 NOTE — Telephone Encounter (Signed)
Patient returning your call.

## 2016-09-05 NOTE — Telephone Encounter (Signed)
Spoke with patient, advised as seen below per Dr. Jertson. Patient verbalizes understanding and is agreeable.   Routing to provider for final review. Patient is agreeable to disposition. Will close encounter.  

## 2016-09-05 NOTE — Telephone Encounter (Signed)
Left message to call Sharee Pimple at [phone].   Order placed for Referral to Genetics -strong family history of ovarian cancer -mother, MGM, cousin   Cc: Theresia Lo

## 2016-09-10 ENCOUNTER — Encounter: Payer: Self-pay | Admitting: Genetic Counselor

## 2016-09-10 ENCOUNTER — Telehealth: Payer: Self-pay | Admitting: Genetic Counselor

## 2016-09-10 NOTE — Telephone Encounter (Signed)
Pt confirmed appt, verified demo and insurance, mailed pt letter, in basket referring appt date/time

## 2016-09-22 DIAGNOSIS — Z780 Asymptomatic menopausal state: Secondary | ICD-10-CM | POA: Diagnosis not present

## 2016-09-24 ENCOUNTER — Ambulatory Visit (INDEPENDENT_AMBULATORY_CARE_PROVIDER_SITE_OTHER): Payer: PPO

## 2016-09-24 ENCOUNTER — Encounter (INDEPENDENT_AMBULATORY_CARE_PROVIDER_SITE_OTHER): Payer: Self-pay | Admitting: Orthopedic Surgery

## 2016-09-24 ENCOUNTER — Telehealth (INDEPENDENT_AMBULATORY_CARE_PROVIDER_SITE_OTHER): Payer: Self-pay | Admitting: Orthopaedic Surgery

## 2016-09-24 ENCOUNTER — Ambulatory Visit (INDEPENDENT_AMBULATORY_CARE_PROVIDER_SITE_OTHER): Payer: PPO | Admitting: Orthopedic Surgery

## 2016-09-24 VITALS — BP 149/84 | HR 70 | Resp 14 | Ht 67.0 in | Wt 181.0 lb

## 2016-09-24 DIAGNOSIS — G8929 Other chronic pain: Secondary | ICD-10-CM

## 2016-09-24 DIAGNOSIS — R1032 Left lower quadrant pain: Secondary | ICD-10-CM

## 2016-09-24 DIAGNOSIS — R202 Paresthesia of skin: Secondary | ICD-10-CM

## 2016-09-24 DIAGNOSIS — M542 Cervicalgia: Secondary | ICD-10-CM

## 2016-09-24 DIAGNOSIS — R200 Anesthesia of skin: Secondary | ICD-10-CM | POA: Diagnosis not present

## 2016-09-24 DIAGNOSIS — M25512 Pain in left shoulder: Secondary | ICD-10-CM

## 2016-09-24 DIAGNOSIS — M1612 Unilateral primary osteoarthritis, left hip: Secondary | ICD-10-CM | POA: Diagnosis not present

## 2016-09-24 NOTE — Progress Notes (Signed)
Office Visit Note   Patient: Sharon [name]           Date of Birth: [birth year]           MRN: [id number] Visit Date: 09/24/2016              Requested by: Levin Erp, MD [address] [place] [address] [place], [place] [address] PCP: Criselda Peaches, MD   Assessment & Plan: Visit Diagnoses:  1. Chronic left shoulder pain   2. Cervical spine pain   3. Left groin pain   4. Numbness and tingling of left leg   5. Primary osteoarthritis of left hip   6. Numbness and tingling in left arm     Plan:  #1: MRI scan of the cervical spine to rule out HNP or spinal stenosis. #2: We have discussed the possibility of doing a corticosteroid injection to the left hip if she would so desire. #3: Return after MRI  Follow-Up Instructions: Return in about 2 weeks (around 10/08/2016).   Orders:  Orders Placed This Encounter  Procedures  . XR Lumbar Spine 2-3 Views  . XR Cervical Spine 2 or 3 views  . XR Shoulder Left  . XR Pelvis 1-2 Views  . XR HIP UNILAT W OR W/O PELVIS 1V LEFT  . MR Cervical Spine w/o contrast   No orders of the defined types were placed in this encounter.     Procedures: No procedures performed   Clinical Data: No additional findings.   Subjective: Chief Complaint  Patient presents with  . Left Shoulder - Pain  . Left Hip - Pain  . Left Knee - Pain    Sharon [name] is a pleasant 65 year old white female is seen today for multiple problems.  In regards to her left shoulder she states he's had several month history of intermittent pain in the left shoulder. She finds that when she is doing water aerobics with weights at causes her symptoms in the left arm. She did try without weights and she did have some benefit. She was laying on her arm in the whole arm started to hurt and actually saw started having numbness into the ulnar aspect of the forearm and into the little finger. She does have some neck pain. Is able to restore Breault. He does have pain in the  shoulder with the range of motion.  Regards to her left hip she is complaining of some catching symptoms in her groin with occasional giving way. Has pain goes from her hip down into her knee. Numbness in the knee and does traverse to the foot area. She has difficulty with walking at time. Occasional low back pain noted. She has used Advil which has been somewhat beneficial.     Left shoulder - limited range of motion, no injury, pain x 2 months, pain comes and goes, pain radiates from shoulder to hand, back and chest area Left knee, numbness from knee to foot x 1 day Left groin/hip area - buckles at times, pain x 1 month Difficulty walking at times, Advil helps Some low back pain    Review of Systems  Constitutional: Negative.   HENT: Negative.   Respiratory: Negative.   Cardiovascular:       Hypertension  Gastrointestinal: Negative.   Endocrine: Negative.   Genitourinary: Negative.   Skin: Negative.   Neurological: Negative.   Hematological: Negative.   Psychiatric/Behavioral: Negative.      Objective: Vital Signs: BP (!) 149/84 (BP Location: Left  Arm, Patient Position: Sitting, Cuff Size: Normal)   Pulse 70   Resp 14   Ht 5\' 7"  (1.702 m)   Wt 181 lb (82.1 kg)   LMP 02/17/1989   BMI 28.35 kg/m   Physical Exam  Constitutional: She is oriented to person, place, and time. She appears well-developed and well-nourished.  HENT:  Head: Normocephalic and atraumatic.  Eyes: EOM are normal. Pupils are equal, round, and reactive to light.  Neck:  No carotid bruits  Pulmonary/Chest: Effort normal.  Neurological: She is alert and oriented to person, place, and time.  Skin: Skin is warm and dry.  Psychiatric: She has a normal mood and affect. Her behavior is normal. Judgment and thought content normal.    Left Hip Exam   Range of Motion  Internal Rotation: 15  External Rotation: 30    Back Exam   Range of Motion  Back extension: Backward extension cervical spine  about 45 45 of.  Back flexion: Cervical spine to chin.  Rotation Right: 40  Rotation Left: 40   Muscle Strength  Right Quadriceps:  4/5  Left Quadriceps:  4/5  Right Hamstrings:  4/5  Left Hamstrings:  4/5   Tests  Straight leg raise right: negative Straight leg raise left: negative  Reflexes  Patellar: 3/4 Achilles: 2/4 Biceps: 3/4  Other  Sensation: normal Gait: normal   Comments:  Good equal strength in the upper and lower extremities bilateral symmetric.   Left Shoulder Exam   Range of Motion  Active Abduction: 90  Passive Abduction: 100  Forward Flexion: 160  External Rotation: 80  Internal Rotation 90 degrees: 30   Muscle Strength  Abduction: 4/5  Internal Rotation: 4/5  External Rotation: 3/5  Supraspinatus: 4/5  Subscapularis: 4/5   Tests  Impingement: positive (mild)  Other  Sensation: normal Pulse: present       Specialty Comments:  No specialty comments available.  Imaging: Xr Hip Unilat W Or W/o Pelvis 1v Left  Result Date: 09/24/2016 X-ray of the left hip reveals periarticular spurring especially at these superior lateral dome of the acetabulum. Spurs noted at the of the femoral head. Appears to be cystic changes in the acetabulum and/or the head.  Xr Cervical Spine 2 Or 3 Views  Result Date: 09/24/2016 Two-view cervical spine reveals straightening of the lordotic curve of the C-spine. Tears spurring noted [id number]. C5-6 and C6-7 degenerative disc disease narrowed. Spondylosis effusive throughout the cervical spine.  Xr Lumbar Spine 2-3 Views  Result Date: 09/24/2016 Two-view lumbar spine reveals mild scoliotic deformity apex to the right at L3. Disc disease noted L5-S1 with maybe a little bit of a retrolisthesis. Degenerative disc disease otherwise the lumbar spine T12-L1 also has degenerative changes at the disc space as well as anterior spurring.  Xr Pelvis 1-2 Views  Result Date: 09/24/2016 AP pelvis reveals SI joint  degenerative changes. Mild joint space narrowing more along the medial wall of the acetabulum. Is to be cystic changes throughout the head and acetabulum.  Xr Shoulder Left  Result Date: 09/24/2016 Two-view shoulder reveals a type 2 acromion. Some mild changes of DJD at the acromioclavicular joint.  relatively good joint space noted.    PMFS History: Patient Active Problem List   Diagnosis Date Noted  . Osteoarthritis of left knee 04/03/2015  . S/P total knee replacement using cement 04/03/2015  . Gait abnormality 12/22/2012  . Swelling of ankle 12/22/2012  . Ringworm   . Hypertension   . Fibroid   .  Headache(784.0)   . Arthritis   . Vitamin D deficiency    Past Medical History:  Diagnosis Date  . Arthritis    Osteoarthritis  . Fibroid   . Headache(784.0)   . Hypertension   . Ringworm   . Vitamin D deficiency     Family History  Problem Relation Age of Onset  . Ovarian cancer Mother   . Cancer Mother     OVARIAN  . Heart disease Maternal Grandfather   . Hypertension Paternal Grandmother   . Ovarian cancer Cousin   . Cancer Cousin     OVARIAN  . Heart disease Father   . Atrial fibrillation Father   . Heart disease Brother   . Heart disease Brother   . Colon cancer Maternal Aunt   . Diabetes Paternal Aunt   . Ovarian cancer Maternal Grandmother   . Cancer Maternal Grandmother     OVARIAN AND UTERINE  . Stomach cancer Maternal Uncle     Past Surgical History:  Procedure Laterality Date  . ABDOMINAL HYSTERECTOMY  1990   TAH BSO  . APPENDECTOMY  1969  . FOOT SURGERY     bunionectomy  . KNEE SURGERY  11/2013   arthroscopic L knee  . OOPHORECTOMY     BSO  . Precancerous lesion excised    . TONSILLECTOMY    . TOTAL KNEE ARTHROPLASTY  2012  . TOTAL KNEE ARTHROPLASTY Left 04/03/2015   Procedure: TOTAL KNEE ARTHROPLASTY;  Surgeon: Garald Balding, MD;  Location: [facility];  Service: Orthopedics;  Laterality: Left;   Social History   Occupational History  .  Not on file.   Social History Main Topics  . Smoking status: Never Smoker  . Smokeless tobacco: Never Used  . Alcohol use 1.5 oz/week    3 Standard drinks or equivalent per week  . Drug use: No  . Sexual activity: Yes    Partners: Male    Birth control/ protection: Surgical, Post-menopausal

## 2016-09-24 NOTE — Telephone Encounter (Signed)
Patient would to know if she can do hip exercises or stretches. She forgot to ask at appt with Aaron Edelman today.

## 2016-09-25 NOTE — Telephone Encounter (Signed)
Left VM for pt per BP

## 2016-09-25 NOTE — Telephone Encounter (Signed)
Yes she can but if it hurts, stop it

## 2016-09-25 NOTE — Telephone Encounter (Signed)
Please advise and I will call.

## 2016-09-30 ENCOUNTER — Other Ambulatory Visit (INDEPENDENT_AMBULATORY_CARE_PROVIDER_SITE_OTHER): Payer: Self-pay | Admitting: Orthopedic Surgery

## 2016-10-01 ENCOUNTER — Encounter: Payer: Self-pay | Admitting: Obstetrics and Gynecology

## 2016-10-03 ENCOUNTER — Ambulatory Visit
Admission: RE | Admit: 2016-10-03 | Discharge: 2016-10-03 | Disposition: A | Discharge status: Home / Self Care | Payer: PPO | Source: Ambulatory Visit | Attending: Orthopedic Surgery | Admitting: Orthopedic Surgery

## 2016-10-03 DIAGNOSIS — M50223 Other cervical disc displacement at C6-C7 level: Secondary | ICD-10-CM | POA: Diagnosis not present

## 2016-10-07 ENCOUNTER — Encounter (INDEPENDENT_AMBULATORY_CARE_PROVIDER_SITE_OTHER): Payer: Self-pay | Admitting: Orthopaedic Surgery

## 2016-10-07 ENCOUNTER — Ambulatory Visit (INDEPENDENT_AMBULATORY_CARE_PROVIDER_SITE_OTHER): Payer: PPO | Admitting: Orthopaedic Surgery

## 2016-10-07 VITALS — BP 123/75 | HR 68 | Ht 67.0 in | Wt 181.0 lb

## 2016-10-07 DIAGNOSIS — M25512 Pain in left shoulder: Secondary | ICD-10-CM | POA: Diagnosis not present

## 2016-10-07 DIAGNOSIS — G8929 Other chronic pain: Secondary | ICD-10-CM | POA: Diagnosis not present

## 2016-10-07 DIAGNOSIS — M542 Cervicalgia: Secondary | ICD-10-CM | POA: Diagnosis not present

## 2016-10-07 MED ORDER — DICLOFENAC SODIUM 2 % TD SOLN: 1.0000 "application " | Freq: Two times a day (BID) | TRANSDERMAL | 0 refills | Status: DC | PRN

## 2016-10-07 NOTE — Progress Notes (Signed)
Office Visit Note   Patient: Sharon [name]           Date of Birth: 15-Mar-1951           MRN: [id number] Visit Date: 10/07/2016              Requested by: Sharon Erp, MD [address] [place] [address] [place], [place] [address] PCP: Sharon Peaches, MD   Assessment & Plan: Visit Diagnoses: Osteoarthritis cervical spine. Impingement syndrome left shoulder I do believe that she has 2 separate problems with arthritis of the cervical spine and probably a mild sub acromial bursitis.  Plan: Physical therapy consult for cervical spine and shoulder exercises. We will see back on a when necessary basis for consideration of cervical spine or left shoulder injection. I have given her samples of Voltaren gel. Follow-Up Instructions: No Follow-up on file.   Orders:  No orders of the defined types were placed in this encounter.  No orders of the defined types were placed in this encounter.     Procedures: No procedures performed   Clinical Data: No additional findings.   Subjective: No chief complaint on file.   Pt here today to go over results of MRI lumbar  Sharon [name] recently saw Sharon [name] in the office for evaluation of chronic neck pain associated with some discomfort in the left shoulder. Question was whether or not there was an association between the 2 areas or if she had 2 separate problems. Because of the neck stiffness and soreness and MRI scan was performed.  Occasionally will experience some pain referred from the subacromial region of the left shoulder into the arm. She has not had any numbness or tingling. There is a strong family history of arthritis    Review of Systems   Objective: Vital Signs: LMP 02/17/1989   Physical Exam  Ortho Exam exam of the cervical spine reveal no loss of motion in flexion and extension or rotation. Was no pain referred to either upper extremity with neck flexion or extension. Good grip distally. Reflexes were symmetrical. Mild impingement  testing of left upper extremity. Good strength. Negative empty can.  Specialty Comments:  No specialty comments available.  Imaging: No results found. MRI scan was reviewed demonstrating diffuse degenerative changes of the cervical spine from C2-3 through C6-7. There were some areas of foraminal stenosis on the left. I do not think that she has radicular pain   PMFS History: Patient Active Problem List   Diagnosis Date Noted  . Osteoarthritis of left knee 04/03/2015  . S/P total knee replacement using cement 04/03/2015  . Gait abnormality 12/22/2012  . Swelling of ankle 12/22/2012  . Ringworm   . Hypertension   . Fibroid   . Headache(784.0)   . Arthritis   . Vitamin D deficiency    Past Medical History:  Diagnosis Date  . Arthritis    Osteoarthritis  . Fibroid   . Headache(784.0)   . Hypertension   . Ringworm   . Vitamin D deficiency     Family History  Problem Relation Age of Onset  . Ovarian cancer Mother   . Cancer Mother     OVARIAN  . Heart disease Maternal Grandfather   . Hypertension Paternal Grandmother   . Ovarian cancer Cousin   . Cancer Cousin     OVARIAN  . Heart disease Father   . Atrial fibrillation Father   . Heart disease Brother   . Heart disease Brother   . Colon  cancer Maternal Aunt   . Diabetes Paternal Aunt   . Ovarian cancer Maternal Grandmother   . Cancer Maternal Grandmother     OVARIAN AND UTERINE  . Stomach cancer Maternal Uncle     Past Surgical History:  Procedure Laterality Date  . ABDOMINAL HYSTERECTOMY  1990   TAH BSO  . APPENDECTOMY  1969  . FOOT SURGERY     bunionectomy  . KNEE SURGERY  11/2013   arthroscopic L knee  . OOPHORECTOMY     BSO  . Precancerous lesion excised    . TONSILLECTOMY    . TOTAL KNEE ARTHROPLASTY  2012  . TOTAL KNEE ARTHROPLASTY Left 04/03/2015   Procedure: TOTAL KNEE ARTHROPLASTY;  Surgeon: Garald Balding, MD;  Location: [facility];  Service: Orthopedics;  Laterality: Left;   Social History    Occupational History  . Not on file.   Social History Main Topics  . Smoking status: Never Smoker  . Smokeless tobacco: Never Used  . Alcohol use 1.5 oz/week    3 Standard drinks or equivalent per week  . Drug use: No  . Sexual activity: Yes    Partners: Male    Birth control/ protection: Surgical, Post-menopausal

## 2016-10-08 ENCOUNTER — Ambulatory Visit (INDEPENDENT_AMBULATORY_CARE_PROVIDER_SITE_OTHER): Payer: PPO | Admitting: Orthopedic Surgery

## 2016-10-09 ENCOUNTER — Ambulatory Visit: Payer: PPO | Attending: Orthopaedic Surgery | Admitting: Physical Therapy

## 2016-10-09 ENCOUNTER — Telehealth: Payer: Self-pay | Admitting: Obstetrics and Gynecology

## 2016-10-09 DIAGNOSIS — R252 Cramp and spasm: Secondary | ICD-10-CM | POA: Insufficient documentation

## 2016-10-09 DIAGNOSIS — H43813 Vitreous degeneration, bilateral: Secondary | ICD-10-CM | POA: Diagnosis not present

## 2016-10-09 DIAGNOSIS — H04123 Dry eye syndrome of bilateral lacrimal glands: Secondary | ICD-10-CM | POA: Diagnosis not present

## 2016-10-09 DIAGNOSIS — H2513 Age-related nuclear cataract, bilateral: Secondary | ICD-10-CM | POA: Diagnosis not present

## 2016-10-09 DIAGNOSIS — M25612 Stiffness of left shoulder, not elsewhere classified: Secondary | ICD-10-CM

## 2016-10-09 DIAGNOSIS — M6281 Muscle weakness (generalized): Secondary | ICD-10-CM | POA: Insufficient documentation

## 2016-10-09 DIAGNOSIS — M25512 Pain in left shoulder: Secondary | ICD-10-CM | POA: Insufficient documentation

## 2016-10-09 DIAGNOSIS — H16223 Keratoconjunctivitis sicca, not specified as Sjogren's, bilateral: Secondary | ICD-10-CM | POA: Diagnosis not present

## 2016-10-09 DIAGNOSIS — G8929 Other chronic pain: Secondary | ICD-10-CM | POA: Diagnosis not present

## 2016-10-09 NOTE — Therapy (Signed)
[facility] [address], [place], [place], [address] Phone: [phone]   Fax:  [phone]  Physical Therapy Evaluation  Patient Details  Name: Sharon [name] MRN: [id number] Date of Birth: May 04, 1951 Referring Provider: Dr. Durward Fortes  Encounter Date: 10/09/2016      PT End of Session - 10/09/16 1443    Visit Number 1   Number of Visits 10   Date for PT Re-Evaluation 12/04/16   Authorization Type Medicare; G codes;  KX at visit 15   PT Start Time 0846   PT Stop Time 0929   PT Time Calculation (min) 43 min   Activity Tolerance Patient tolerated treatment well      Past Medical History:  Diagnosis Date  . Arthritis    Osteoarthritis  . Fibroid   . Headache(784.0)   . Hypertension   . Ringworm   . Vitamin D deficiency     Past Surgical History:  Procedure Laterality Date  . ABDOMINAL HYSTERECTOMY  1990   TAH BSO  . APPENDECTOMY  1969  . FOOT SURGERY     bunionectomy  . KNEE SURGERY  11/2013   arthroscopic L knee  . OOPHORECTOMY     BSO  . Precancerous lesion excised    . TONSILLECTOMY    . TOTAL KNEE ARTHROPLASTY  2012  . TOTAL KNEE ARTHROPLASTY Left 04/03/2015   Procedure: TOTAL KNEE ARTHROPLASTY;  Surgeon: Garald Balding, MD;  Location: [facility];  Service: Orthopedics;  Laterality: Left;    There were no vitals filed for this visit.       Subjective Assessment - 10/09/16 0849    Subjective no injury;  OA in neck;  my shoulder has been bothering me;  wake up with stiffness;  present for a few months;  right hand dominant;  does water walking with weights some pain after;  went to [facility] for posture re-eductation for pectoral stretching and massage 2 years ago and TKR rehab;     Pertinent History HTN; bilateral TKR    Limitations House hold activities   Diagnostic tests X-ray;  MRI of neck showed OA no disc problem    Patient Stated Goals be more painfree;  see if I can do  some things with my neck to help control/relieve   Currently in Pain? Yes   Pain Score 2    Pain Location Shoulder   Pain Orientation Left   Pain Type Chronic pain   Pain Radiating Towards scapula   Pain Frequency Intermittent   Aggravating Factors  lifting with left arm;  lying on left side;  moving arm a certain way when dressing ;  occassionally will awaken   Multiple Pain Sites Yes   Pain Score 0   Pain Location Neck   Pain Orientation Left   Pain Type Chronic pain   Pain Frequency Intermittent            [facility] PT Assessment - 10/09/16 0001      Assessment   Medical Diagnosis chronic left shoulder pain; neck pain   Referring Provider Dr. Durward Fortes   Onset Date/Surgical Date --  3 months   Hand Dominance Right   Next MD Visit not scheduled-if not better will do injection   Prior Therapy years ago     Precautions   Precautions None     Restrictions   Weight Bearing Restrictions No     Balance Screen   Has the patient fallen in the past  6 months No   Has the patient had a decrease in activity level because of a fear of falling?  No   Is the patient reluctant to leave their home because of a fear of falling?  No     Home Environment   Living Environment Private residence   Living Arrangements Spouse/significant other   Type of [place]     Prior Function   Level of [place] Retired   Leisure be in [employer]; walk, arts and crafts; sing in choir     Observation/Other Assessments   Focus on Therapeutic Outcomes (FOTO)  43% limitation      Posture/Postural Control   Posture/Postural Control Postural limitations   Postural Limitations Rounded Shoulders;Forward head;Increased thoracic kyphosis     ROM / Strength   AROM / PROM / Strength AROM;Strength     AROM   AROM Assessment Site Shoulder;Cervical   Right/Left Shoulder Right;Left   Right Shoulder Flexion 160 Degrees   Right Shoulder ABduction 160 Degrees   Right Shoulder  Internal Rotation --  T6   Right Shoulder External Rotation 90 Degrees   Left Shoulder Flexion 147 Degrees   Left Shoulder ABduction 150 Degrees   Left Shoulder Internal Rotation --  T6   Left Shoulder External Rotation 82 Degrees   Cervical Flexion 50   Cervical Extension 40   Cervical - Right Side Bend 30   Cervical - Left Side Bend 30   Cervical - Right Rotation 50   Cervical - Left Rotation 43     Strength   Strength Assessment Site Shoulder;Cervical   Right/Left Shoulder Right;Left   Right Shoulder Flexion 5/5   Right Shoulder Extension 5/5   Right Shoulder ABduction 5/5   Right Shoulder Internal Rotation 5/5   Right Shoulder External Rotation 5/5   Left Shoulder Flexion 4/5   Left Shoulder Extension 4/5   Left Shoulder ABduction 4/5   Left Shoulder Internal Rotation 4/5   Left Shoulder External Rotation 4/5   Left Shoulder Horizontal ABduction 4/5   Left Shoulder Horizontal ADduction 4/5   Cervical Flexion 4/5   Cervical Extension 4/5     Palpation   Palpation comment Tender points in upper traps, rhomboids on left     Special Tests   Rotator Cuff Impingment tests Empty Can test;Lag signs at 0 degrees;Drop Arm test     Empty Can test   Findings Positive   Side Left     Lag time at 0 degrees   Findings Negative   Side Left     Drop Arm test   Findings Negative   Side Left                           PT Education - 10/09/16 1443    Education provided Yes   Education Details postural education; scapular retractions   Person(s) Educated Patient   Methods Explanation;Demonstration;Handout   Comprehension Verbalized understanding;Returned demonstration          PT Short Term Goals - 10/09/16 1659      PT SHORT TERM GOAL #1   Title The patient will demonstrate postural awareness and alignment in sitting as well as basic self care strategies   11/06/16   Time 4   Period Weeks   Status New     PT SHORT TERM GOAL #2   Title The  patient will demonstrate improved left shoulder flexion and abduction to  155 degrees needed for overhead reaching   Time 4   Period Weeks   Status New     PT SHORT TERM GOAL #3   Title The patient will have a 30% improvement in pain with dressing and night time discomfort   Time 4   Period Weeks   Status New           PT Long Term Goals - 10/09/16 1702      PT LONG TERM GOAL #1   Title The patient will be independent in safe, self progression of HEP and aquatic ex program for further strengthening, ROM and pain reduction   12/04/16   Time 8   Period Weeks   Status New     PT LONG TERM GOAL #2   Title The patient will have 4+/5 scapula, shoulder and cervical muscle strength needed for maintaining erect posture singing in the choir and doing arts and crafts   Time 8   Period Weeks   Status New     PT LONG TERM GOAL #3   Title Left shoulder flexion and abduction 160 degrees, external rotation to 85 degrees needed for dressing and reaching   Time 8   Period Weeks   Status New     PT LONG TERM GOAL #4   Title Patient will report a 60% improvement in pain with usual ADLs   Time 8   Period Weeks   Status New     PT LONG TERM GOAL #5   Title FOTO functional outcome score improved from 43% limitation to 33% indicating improved function with less pain   Time 8   Period Weeks   Status New               Plan - 10/09/16 1444    Clinical Impression Statement The patient is a pleasant 65 year old who complains of left superior shoulder, left scapula and left sided neck pain which began for no apparent reason a few months ago.  She had an MRI which showed osteoarthritic changes but no disc issue.  The patient has poor sitting posture with forward head, rounded shoulders and increased thoracic kyphosis.  Decreased shoulder ROM: flexion 147 degrees, abduction 150, ER 82 degrees.  Decreased cervical ROM:  flexion 50, ext 40, bilateral sidebend 30, left rotation 43, right  rotation 50 degrees.  Decreased thoracic extension.  Decreased deep cervical and scapular muscle strength 4/5.  Tender points in upper trapezius and rhomboids,  Positive active compression test.  The patient is of low complexity evaluation secondary to good home support, stable condition and lack of co-morbidities.     Rehab Potential Good   PT Frequency 2x / week   PT Duration 8 weeks   PT Treatment/Interventions ADLs/Self Care Home Management;Cryotherapy;Electrical Stimulation;Iontophoresis 4mg /ml Dexamethasone;Moist Heat;Ultrasound;Patient/family education;Therapeutic exercise;Neuromuscular re-education;Manual techniques;Dry needling;Taping   PT Next Visit Plan ionto if cert signed;  dry needling upper trap, rhomboids;  thoracic extension;  scapular strengthening;  postural correction      Patient will benefit from skilled therapeutic intervention in order to improve the following deficits and impairments:  Decreased range of motion, Decreased strength, Increased muscle spasms, Pain, Increased fascial restricitons, Postural dysfunction  Visit Diagnosis: Chronic left shoulder pain - Plan: PT plan of care cert/re-cert  Stiffness of left shoulder, not elsewhere classified - Plan: PT plan of care cert/re-cert  Muscle weakness (generalized) - Plan: PT plan of care cert/re-cert  Cramp and spasm - Plan: PT plan of care cert/re-cert  G-Codes - 10/09/16 1707    Functional Assessment Tool Used FOTO; clinical judgement    Functional Limitation Carrying, moving and handling objects   Carrying, Moving and Handling Objects Current Status [phone]) At least 40 percent but less than 60 percent impaired, limited or restricted   Carrying, Moving and Handling Objects Goal Status [id number]) At least 20 percent but less than 40 percent impaired, limited or restricted       Problem List Patient Active Problem List   Diagnosis Date Noted  . Osteoarthritis of left knee 04/03/2015  . S/P total knee  replacement using cement 04/03/2015  . Gait abnormality 12/22/2012  . Swelling of ankle 12/22/2012  . Ringworm   . Hypertension   . Fibroid   . Headache(784.0)   . Arthritis   . Vitamin D deficiency    Ruben Im, PT 10/09/16 5:10 PM Phone: [phone] Fax: [phone]  Alvera Singh 10/09/2016, 5:10 PM  [facility] [address] [place], [place], [address] Phone: [phone]   Fax:  [phone]  Name: Sharon [name] MRN: [id number] Date of Birth: 23-Mar-1951

## 2016-10-09 NOTE — Telephone Encounter (Signed)
Please inform the patient that her DEXA was normal. F/U in 10 years. She should be getting calcium 1200 mg a day (between diet and supplements). She should be on daily vit D (should be getting vit d levels with her primary at her routine labs) and exercise regularly.

## 2016-10-09 NOTE — Patient Instructions (Signed)
  Watch posture and avoid shrugging shoulders.  Squeeze shoulder blades   Ruben Im PT [facility] [address] [place], [place] [address] Phone # [phone] Fax [phone]

## 2016-10-10 ENCOUNTER — Ambulatory Visit (INDEPENDENT_AMBULATORY_CARE_PROVIDER_SITE_OTHER): Payer: PPO | Admitting: Orthopaedic Surgery

## 2016-10-15 ENCOUNTER — Encounter: Payer: Self-pay | Admitting: Obstetrics and Gynecology

## 2016-10-15 NOTE — Telephone Encounter (Signed)
Spoke with patient and went over results and recommendations per Dr. Talbert Nan. Patient voiced understanding -eh

## 2016-10-16 ENCOUNTER — Ambulatory Visit: Payer: PPO | Admitting: Physical Therapy

## 2016-10-16 ENCOUNTER — Encounter: Payer: Self-pay | Admitting: Physical Therapy

## 2016-10-16 DIAGNOSIS — G8929 Other chronic pain: Secondary | ICD-10-CM

## 2016-10-16 DIAGNOSIS — M25512 Pain in left shoulder: Principal | ICD-10-CM

## 2016-10-16 DIAGNOSIS — R252 Cramp and spasm: Secondary | ICD-10-CM

## 2016-10-16 DIAGNOSIS — M25612 Stiffness of left shoulder, not elsewhere classified: Secondary | ICD-10-CM

## 2016-10-16 DIAGNOSIS — M6281 Muscle weakness (generalized): Secondary | ICD-10-CM

## 2016-10-16 NOTE — Therapy (Signed)
[facility] [address], [place], [place], [address] Phone: [phone]   Fax:  [phone]  Physical Therapy Treatment  Patient Details  Name: Sharon [name] MRN: [id number] Date of Birth: 1951/08/19 Referring Provider: Dr. Durward Fortes  Encounter Date: 10/16/2016      PT End of Session - 10/16/16 1401    Visit Number 2   Number of Visits 10   Date for PT Re-Evaluation 12/04/16   Authorization Type Medicare; G codes;  KX at visit 15   PT Start Time 1401   PT Stop Time 1447   PT Time Calculation (min) 46 min   Activity Tolerance Patient tolerated treatment well   Behavior During Therapy [facility] for tasks assessed/performed      Past Medical History:  Diagnosis Date  . Arthritis    Osteoarthritis  . Fibroid   . Headache(784.0)   . Hypertension   . Ringworm   . Vitamin D deficiency     Past Surgical History:  Procedure Laterality Date  . ABDOMINAL HYSTERECTOMY  1990   TAH BSO  . APPENDECTOMY  1969  . FOOT SURGERY     bunionectomy  . KNEE SURGERY  11/2013   arthroscopic L knee  . OOPHORECTOMY     BSO  . Precancerous lesion excised    . TONSILLECTOMY    . TOTAL KNEE ARTHROPLASTY  2012  . TOTAL KNEE ARTHROPLASTY Left 04/03/2015   Procedure: TOTAL KNEE ARTHROPLASTY;  Surgeon: Garald Balding, MD;  Location: [facility];  Service: Orthopedics;  Laterality: Left;    There were no vitals filed for this visit.      Subjective Assessment - 10/16/16 1407    Subjective Pt states shoulder is feeling okay today and denies pain.  Reports it was sore one morning after sleeping on the shoulder, but it wasn't bad   Currently in Pain? No/denies                         [facility] Adult PT Treatment/Exercise - 10/16/16 0001      Neck Exercises: Supine   Neck Retraction 20 reps;5 secs     Shoulder Exercises: Supine   Horizontal ABduction Strengthening;Both;20 reps;Theraband   Theraband Level (Shoulder  Horizontal ABduction) Level 1 (Yellow)     Shoulder Exercises: Seated   Extension AAROM;15 reps  Thoracic extension     Shoulder Exercises: Standing   External Rotation Both;20 reps;Theraband   Theraband Level (Shoulder External Rotation) Level 1 (Yellow)   Extension Strengthening;Both;Theraband   Theraband Level (Shoulder Extension) Level 1 (Yellow)   Row Strengthening;Both;20 reps;Theraband   Theraband Level (Shoulder Row) Level 1 (Yellow)     Shoulder Exercises: ROM/Strengthening   UBE (Upper Arm Bike) 3x3     Shoulder Exercises: Stretch   Corner Stretch 3 reps;20 seconds                PT Education - 10/16/16 1446    Education provided Yes   Education Details exercises added to HEP   Person(s) Educated Patient   Methods Explanation;Verbal cues;Handout;Demonstration   Comprehension Verbalized understanding;Returned demonstration          PT Short Term Goals - 10/09/16 1659      PT SHORT TERM GOAL #1   Title The patient will demonstrate postural awareness and alignment in sitting as well as basic self care strategies   11/06/16   Time 4   Period Weeks   Status New  PT SHORT TERM GOAL #2   Title The patient will demonstrate improved left shoulder flexion and abduction to 155 degrees needed for overhead reaching   Time 4   Period Weeks   Status New     PT SHORT TERM GOAL #3   Title The patient will have a 30% improvement in pain with dressing and night time discomfort   Time 4   Period Weeks   Status New           PT Long Term Goals - 10/09/16 1702      PT LONG TERM GOAL #1   Title The patient will be independent in safe, self progression of HEP and aquatic ex program for further strengthening, ROM and pain reduction   12/04/16   Time 8   Period Weeks   Status New     PT LONG TERM GOAL #2   Title The patient will have 4+/5 scapula, shoulder and cervical muscle strength needed for maintaining erect posture singing in the choir and doing arts  and crafts   Time 8   Period Weeks   Status New     PT LONG TERM GOAL #3   Title Left shoulder flexion and abduction 160 degrees, external rotation to 85 degrees needed for dressing and reaching   Time 8   Period Weeks   Status New     PT LONG TERM GOAL #4   Title Patient will report a 60% improvement in pain with usual ADLs   Time 8   Period Weeks   Status New     PT LONG TERM GOAL #5   Title FOTO functional outcome score improved from 43% limitation to 33% indicating improved function with less pain   Time 8   Period Weeks   Status New               Plan - 10/16/16 1741    Clinical Impression Statement Initial treatment after eval so no progress was recorded.  Pt able to perform exercises during treatment correctly with cues and did not increase pain.  Pt felt fatigued with exercises.  Pt demonstrates forward and rounded shoulders and able to improve posture with cueing.  States she has been able to pay more attention to posture since receiving instrucitons from PT.  PT needed to cont strengthening for improved posture.   Rehab Potential Good   PT Frequency 2x / week   PT Duration 8 weeks   PT Treatment/Interventions ADLs/Self Care Home Management;Cryotherapy;Electrical Stimulation;Iontophoresis 4mg /ml Dexamethasone;Moist Heat;Ultrasound;Patient/family education;Therapeutic exercise;Neuromuscular re-education;Manual techniques;Dry needling;Taping   PT Next Visit Plan ionto if cert signed;  dry needling upper trap, rhomboids;  thoracic extension;  scapular strengthening;  postural correction   PT Home Exercise Plan add band exercises for mid and low trap   Consulted and Agree with Plan of Care Patient      Patient will benefit from skilled therapeutic intervention in order to improve the following deficits and impairments:  Decreased range of motion, Decreased strength, Increased muscle spasms, Pain, Increased fascial restricitons, Postural dysfunction  Visit  Diagnosis: Chronic left shoulder pain  Stiffness of left shoulder, not elsewhere classified  Muscle weakness (generalized)  Cramp and spasm     Problem List Patient Active Problem List   Diagnosis Date Noted  . Osteoarthritis of left knee 04/03/2015  . S/P total knee replacement using cement 04/03/2015  . Gait abnormality 12/22/2012  . Swelling of ankle 12/22/2012  . Ringworm   . Hypertension   .  Fibroid   . Headache(784.0)   . Arthritis   . Vitamin D deficiency     Zannie Cove, PT 10/16/2016, 5:45 PM  [facility] [address] [place], [place], [address] Phone: [phone]   Fax:  [phone]  Name: Sharon [name] MRN: [id number] Date of Birth: 03-11-1951

## 2016-10-16 NOTE — Patient Instructions (Signed)
CHEST: Doorway, Bilateral - Standing    Standing in doorway, place hands on wall with elbows bent at shoulder height. Lean forward. Hold _20__ seconds. 3___ reps per set, ___ sets per day, ___ days per week  Copyright  VHI. All rights reserved.  (Clinic) Extension: Cervico-Thoracic With UE Facilitation    Sit with trunk leaning back, towel or wedge at base of neck. Tuck chin, reaching arms up and back. Hold position __5__ seconds, pressing base of neck against wedge. Repeat _20___ times per set. Do _1___ sets per session. Do __1__ sessions per week.  Copyright  VHI. All rights reserved.    [facility] [address] [place], [place] [address] Phone # [phone] Fax [phone]

## 2016-10-21 ENCOUNTER — Ambulatory Visit: Payer: PPO | Attending: Orthopaedic Surgery | Admitting: Physical Therapy

## 2016-10-21 DIAGNOSIS — G8929 Other chronic pain: Secondary | ICD-10-CM | POA: Insufficient documentation

## 2016-10-21 DIAGNOSIS — R252 Cramp and spasm: Secondary | ICD-10-CM | POA: Insufficient documentation

## 2016-10-21 DIAGNOSIS — M25612 Stiffness of left shoulder, not elsewhere classified: Secondary | ICD-10-CM | POA: Diagnosis not present

## 2016-10-21 DIAGNOSIS — M25512 Pain in left shoulder: Secondary | ICD-10-CM | POA: Insufficient documentation

## 2016-10-21 DIAGNOSIS — M6281 Muscle weakness (generalized): Secondary | ICD-10-CM | POA: Insufficient documentation

## 2016-10-21 NOTE — Patient Instructions (Signed)
    Trigger Point Dry Needling  . What is Trigger Point Dry Needling (DN)? o DN is a physical therapy technique used to treat muscle pain and dysfunction. Specifically, DN helps deactivate muscle trigger points (muscle knots).  o A thin filiform needle is used to penetrate the skin and stimulate the underlying trigger point. The goal is for a local twitch response (LTR) to occur and for the trigger point to relax. No medication of any kind is injected during the procedure.   . What Does Trigger Point Dry Needling Feel Like?  o The procedure feels different for each individual patient. Some patients report that they do not actually feel the needle enter the skin and overall the process is not painful. Very mild bleeding may occur. However, many patients feel a deep cramping in the muscle in which the needle was inserted. This is the local twitch response.   . How Will I feel after the treatment? o Soreness is normal, and the onset of soreness may not occur for a few hours. Typically this soreness does not last longer than two days.  o Bruising is uncommon, however; ice can be used to decrease any possible bruising.  o In rare cases feeling tired or nauseous after the treatment is normal. In addition, your symptoms may get worse before they get better, this period will typically not last longer than 24 hours.   . What Can I do After My Treatment? o Increase your hydration by drinking more water for the next 24 hours. o You may place ice or heat on the areas treated that have become sore, however, do not use heat on inflamed or bruised areas. Heat often brings more relief post needling. o You can continue your regular activities, but vigorous activity is not recommended initially after the treatment for 24 hours. o DN is best combined with other physical therapy such as strengthening, stretching, and other therapies.    IONTOPHORESIS PATIENT PRECAUTIONS & CONTRAINDICATIONS:  . Redness under  one or both electrodes can occur.  This characterized by a uniform redness that usually disappears within 12 hours of treatment. . Small pinhead size blisters may result in response to the drug.  Contact your physician if the problem persists more than 24 hours. . On rare occasions, iontophoresis therapy can result in temporary skin reactions such as rash, inflammation, irritation or burns.  The skin reactions may be the result of individual sensitivity to the ionic solution used, the condition of the skin at the start of treatment, reaction to the materials in the electrodes, allergies or sensitivity to dexamethasone, or a poor connection between the patch and your skin.  Discontinue using iontophoresis if you have any of these reactions and report to your therapist. . Remove the Patch or electrodes if you have any undue sensation of pain or burning during the treatment and report discomfort to your therapist. . Tell your Therapist if you have had known adverse reactions to the application of electrical current. . If using the Patch, the LED light will turn off when treatment is complete and the patch can be removed.  Approximate treatment time is 1-3 hours.  Remove the patch when light goes off or after 6 hours. . The Patch can be worn during normal activity, however excessive motion where the electrodes have been placed can cause poor contact between the skin and the electrode or uneven electrical current resulting in greater risk of skin irritation. . Keep out of the reach   of children.   . DO NOT use if you have a cardiac pacemaker or any other electrically sensitive implanted device. . DO NOT use if you have a known sensitivity to dexamethasone. . DO NOT use during Magnetic Resonance Imaging (MRI). . DO NOT use over broken or compromised skin (e.g. sunburn, cuts, or acne) due to the increased risk of skin reaction. . DO NOT SHAVE over the area to be treated:  To establish good contact between  the Patch and the skin, excessive hair may be clipped. . DO NOT place the Patch or electrodes on or over your eyes, directly over your heart, or brain. . DO NOT reuse the Patch or electrodes as this may cause burns to occur.    Graiden Henes PT [facility] [address], [address] [place], [place] [address] Phone # [phone] Fax [phone] 

## 2016-10-21 NOTE — Therapy (Signed)
[facility] [address], [place], [place], [address] Phone: [phone]   Fax:  [phone]  Physical Therapy Treatment  Patient Details  Name: Sharon [name] MRN: [id number] Date of Birth: 04/30/1951 Referring Provider: Dr. Durward Fortes  Encounter Date: 10/21/2016      PT End of Session - 10/21/16 1755    Visit Number 3   Number of Visits 10   Date for PT Re-Evaluation 12/04/16   Authorization Type Medicare; G codes;  KX at visit 15   PT Start Time [id number]   PT Stop Time 1530   PT Time Calculation (min) 43 min   Activity Tolerance Patient tolerated treatment well      Past Medical History:  Diagnosis Date  . Arthritis    Osteoarthritis  . Fibroid   . Headache(784.0)   . Hypertension   . Ringworm   . Vitamin D deficiency     Past Surgical History:  Procedure Laterality Date  . ABDOMINAL HYSTERECTOMY  1990   TAH BSO  . APPENDECTOMY  1969  . FOOT SURGERY     bunionectomy  . KNEE SURGERY  11/2013   arthroscopic L knee  . OOPHORECTOMY     BSO  . Precancerous lesion excised    . TONSILLECTOMY    . TOTAL KNEE ARTHROPLASTY  2012  . TOTAL KNEE ARTHROPLASTY Left 04/03/2015   Procedure: TOTAL KNEE ARTHROPLASTY;  Surgeon: Garald Balding, MD;  Location: [place];  Service: Orthopedics;  Laterality: Left;    There were no vitals filed for this visit.      Subjective Assessment - 10/21/16 1447    Subjective Denies excessive pain following last session.   Difficulty holding choir book.   Some anterior/lateral left shoulder pain.     Currently in Pain? Yes   Pain Score 2    Pain Location Shoulder   Pain Orientation Left   Pain Type Chronic pain                         [facility] Adult PT Treatment/Exercise - 10/21/16 0001      Shoulder Exercises: Seated   Extension AAROM;15 reps  Thoracic extension over small ball      Shoulder Exercises: Standing   Extension Strengthening;Both;Theraband    Theraband Level (Shoulder Extension) Level 1 (Yellow)   Row Strengthening;Both;20 reps;Theraband   Theraband Level (Shoulder Row) Level 1 (Yellow)     Moist Heat Therapy   Number Minutes Moist Heat 5 Minutes   Moist Heat Location Shoulder;Cervical     Iontophoresis   Type of Iontophoresis Dexamethasone   Location left shoulder   Dose 1 mg/ml #1   Time 4-6 hour patch     Manual Therapy   Manual Therapy Soft tissue mobilization;Scapular mobilization   Soft tissue mobilization left upper trap and rhomboids, subscapular    Scapular Mobilization scapular distraction          Trigger Point Dry Needling - 10/21/16 1505    Consent Given? Yes   Education Handout Provided Yes   Muscles Treated Upper Body Upper trapezius;Levator scapulae;Rhomboids   Upper Trapezius Response Twitch reponse elicited;Palpable increased muscle length   Levator Scapulae Response Twitch response elicited;Palpable increased muscle length   Rhomboids Response Twitch response elicited;Palpable increased muscle length     Left only         PT Education - 10/21/16 1529    Education provided Yes   Education Details ionto  instructions;  dry needling after care   Person(s) Educated Patient   Methods Explanation;Handout   Comprehension Verbalized understanding          PT Short Term Goals - 10/21/16 1801      PT SHORT TERM GOAL #1   Title The patient will demonstrate postural awareness and alignment in sitting as well as basic self care strategies   11/06/16   Time 4   Period Weeks   Status On-going     PT SHORT TERM GOAL #2   Title The patient will demonstrate improved left shoulder flexion and abduction to 155 degrees needed for overhead reaching   Time 4   Period Weeks   Status On-going     PT SHORT TERM GOAL #3   Title The patient will have a 30% improvement in pain with dressing and night time discomfort   Time 4   Period Weeks   Status On-going           PT Long Term Goals -  10/21/16 1801      PT LONG TERM GOAL #1   Title The patient will be independent in safe, self progression of HEP and aquatic ex program for further strengthening, ROM and pain reduction   12/04/16   Time 8   Period Weeks   Status On-going     PT LONG TERM GOAL #2   Title The patient will have 4+/5 scapula, shoulder and cervical muscle strength needed for maintaining erect posture singing in the choir and doing arts and crafts   Time 8   Period Weeks   Status On-going     PT LONG TERM GOAL #3   Title Left shoulder flexion and abduction 160 degrees, external rotation to 85 degrees needed for dressing and reaching   Time 8   Period Weeks   Status On-going     PT LONG TERM GOAL #4   Title Patient will report a 60% improvement in pain with usual ADLs   Time 8   Period Weeks   Status On-going     PT LONG TERM GOAL #5   Title FOTO functional outcome score improved from 43% limitation to 33% indicating improved function with less pain   Time 8   Period Weeks   Status On-going               Plan - 10/21/16 1756    Clinical Impression Statement The patient reports she did well last visit with initial exercises and demonstrates good carryover with decreased compensatory shoulder hiking. Tender points in left upper trapezius and rhomboids with improved soft tissue length noted following dry needling and manual therapy.   Verbal and tactile cues to encourage scapular stabilizer muscle activation.     PT Next Visit Plan continue ionto trial;  assess response to dry needling today;  scapular strengthening      Patient will benefit from skilled therapeutic intervention in order to improve the following deficits and impairments:     Visit Diagnosis: Chronic left shoulder pain  Stiffness of left shoulder, not elsewhere classified  Muscle weakness (generalized)  Cramp and spasm     Problem List Patient Active Problem List   Diagnosis Date Noted  . Osteoarthritis of left  knee 04/03/2015  . S/P total knee replacement using cement 04/03/2015  . Gait abnormality 12/22/2012  . Swelling of ankle 12/22/2012  . Ringworm   . Hypertension   . Fibroid   . Headache(784.0)   . Arthritis   .  Vitamin D deficiency    Ruben Im, PT 10/21/16 6:04 PM Phone: [phone] Fax: [phone]  Alvera Singh 10/21/2016, 6:03 PM  [facility] [address] [place], [place], [address] Phone: [phone]   Fax:  [phone]  Name: Sharon [name] MRN: [id number] Date of Birth: [birth year]

## 2016-10-23 ENCOUNTER — Ambulatory Visit: Payer: PPO | Admitting: Physical Therapy

## 2016-10-23 DIAGNOSIS — M6281 Muscle weakness (generalized): Secondary | ICD-10-CM

## 2016-10-23 DIAGNOSIS — M25512 Pain in left shoulder: Principal | ICD-10-CM

## 2016-10-23 DIAGNOSIS — R252 Cramp and spasm: Secondary | ICD-10-CM

## 2016-10-23 DIAGNOSIS — M25612 Stiffness of left shoulder, not elsewhere classified: Secondary | ICD-10-CM

## 2016-10-23 DIAGNOSIS — G8929 Other chronic pain: Secondary | ICD-10-CM

## 2016-10-23 NOTE — Therapy (Signed)
[facility] [address], [place], [place], [address] Phone: [phone]   Fax:  [phone]  Physical Therapy Treatment  Patient Details  Name: Sharon [name] MRN: [id number] Date of Birth: October 23, 1950 Referring Provider: Dr. Durward Fortes  Encounter Date: 10/23/2016      PT End of Session - 10/23/16 [id number]    Visit Number 4   Number of Visits 10   Date for PT Re-Evaluation 12/04/16   Authorization Type Medicare; G codes;  KX at visit 15   PT Start Time 0800   PT Stop Time 0840   PT Time Calculation (min) 40 min   Activity Tolerance Patient tolerated treatment well      Past Medical History:  Diagnosis Date  . Arthritis    Osteoarthritis  . Fibroid   . Headache(784.0)   . Hypertension   . Ringworm   . Vitamin D deficiency     Past Surgical History:  Procedure Laterality Date  . ABDOMINAL HYSTERECTOMY  1990   TAH BSO  . APPENDECTOMY  1969  . FOOT SURGERY     bunionectomy  . KNEE SURGERY  11/2013   arthroscopic L knee  . OOPHORECTOMY     BSO  . Precancerous lesion excised    . TONSILLECTOMY    . TOTAL KNEE ARTHROPLASTY  2012  . TOTAL KNEE ARTHROPLASTY Left 04/03/2015   Procedure: TOTAL KNEE ARTHROPLASTY;  Surgeon: Garald Balding, MD;  Location: [facility];  Service: Orthopedics;  Laterality: Left;    There were no vitals filed for this visit.      Subjective Assessment - 10/23/16 0800    Subjective Reports mild soreness from dry needling but has improved cervical motion.  Did fine with ionto patch.  Felt some pain with adducting arm in shower this morning.     Currently in Pain? Yes   Pain Score 2    Pain Location Shoulder   Pain Orientation Left   Pain Type Chronic pain                         [facility] Adult PT Treatment/Exercise - 10/23/16 0001      Shoulder Exercises: Prone   Retraction AROM;Left;15 reps   Extension AROM;Left;15 reps   Horizontal ABduction 1 AROM;Left;15 reps    Horizontal ABduction 2 AROM;Left     Shoulder Exercises: Standing   External Rotation Strengthening;Left;10 reps   Theraband Level (Shoulder External Rotation) Level 1 (Yellow)   Internal Rotation Strengthening;Left;15 reps   Theraband Level (Shoulder Internal Rotation) Level 1 (Yellow)   Extension Strengthening;Both;Theraband   Theraband Level (Shoulder Extension) Level 2 (Red)   Row Strengthening;Both;20 reps;Theraband   Theraband Level (Shoulder Row) Level 2 (Red)     Shoulder Exercises: ROM/Strengthening   UBE (Upper Arm Bike) 3x3  ball behind mid back     Iontophoresis   Type of Iontophoresis Dexamethasone   Location left shoulder   Dose 1 mg/ml #2   Time 4-6 hour patch                  PT Short Term Goals - 10/23/16 1846      PT SHORT TERM GOAL #1   Title The patient will demonstrate postural awareness and alignment in sitting as well as basic self care strategies   11/06/16   Status Achieved     PT SHORT TERM GOAL #2   Title The patient will demonstrate improved left shoulder flexion  and abduction to 155 degrees needed for overhead reaching   Time 4   Period Weeks   Status On-going     PT SHORT TERM GOAL #3   Title The patient will have a 30% improvement in pain with dressing and night time discomfort   Time 4   Period Weeks   Status On-going           PT Long Term Goals - 10/23/16 1846      PT LONG TERM GOAL #1   Title The patient will be independent in safe, self progression of HEP and aquatic ex program for further strengthening, ROM and pain reduction   12/04/16   Time 8   Period Weeks   Status On-going     PT LONG TERM GOAL #2   Title The patient will have 4+/5 scapula, shoulder and cervical muscle strength needed for maintaining erect posture singing in the choir and doing arts and crafts   Time 8   Period Weeks   Status On-going     PT LONG TERM GOAL #3   Title Left shoulder flexion and abduction 160 degrees, external rotation to  85 degrees needed for dressing and reaching   Time 8   Period Weeks   Status On-going     PT LONG TERM GOAL #4   Title Patient will report a 60% improvement in pain with usual ADLs   Time 8   Period Weeks   Status On-going     PT LONG TERM GOAL #5   Title FOTO functional outcome score improved from 43% limitation to 33% indicating improved function with less pain   Time 8   Period Weeks   Status On-going               Plan - 10/23/16 1843    Clinical Impression Statement The patient reports minor soreness following dry needling last visit and good response to initiation of iontophoresis.  She is able to participate in scapular muscle strengthening without moderate verbal and tactile cues and without pain exacerbation.  Therapist closely monitoring response with all interventions.     PT Next Visit Plan continue ionto trial;  dry needling as needed;  scapular strengthening including prone Is, Ys, Ts and add to HEP ;  recheck shoulder ROM next week      Patient will benefit from skilled therapeutic intervention in order to improve the following deficits and impairments:     Visit Diagnosis: Chronic left shoulder pain  Stiffness of left shoulder, not elsewhere classified  Muscle weakness (generalized)  Cramp and spasm     Problem List Patient Active Problem List   Diagnosis Date Noted  . Osteoarthritis of left knee 04/03/2015  . S/P total knee replacement using cement 04/03/2015  . Gait abnormality 12/22/2012  . Swelling of ankle 12/22/2012  . Ringworm   . Hypertension   . Fibroid   . Headache(784.0)   . Arthritis   . Vitamin D deficiency    Ruben Im, PT 10/23/16 6:49 PM Phone: [phone] Fax: [phone]  Alvera Singh 10/23/2016, 6:48 PM  [facility] [address] [place], [place], [address] Phone: [phone]   Fax:  [phone]  Name: Sharon [name] MRN:  [id number] Date of Birth: 1951-01-30

## 2016-10-28 ENCOUNTER — Encounter: Payer: Self-pay | Admitting: Physical Therapy

## 2016-10-28 ENCOUNTER — Ambulatory Visit: Payer: PPO | Admitting: Physical Therapy

## 2016-10-28 DIAGNOSIS — M25512 Pain in left shoulder: Principal | ICD-10-CM

## 2016-10-28 DIAGNOSIS — G8929 Other chronic pain: Secondary | ICD-10-CM

## 2016-10-28 DIAGNOSIS — M25612 Stiffness of left shoulder, not elsewhere classified: Secondary | ICD-10-CM

## 2016-10-28 DIAGNOSIS — R252 Cramp and spasm: Secondary | ICD-10-CM

## 2016-10-28 DIAGNOSIS — M6281 Muscle weakness (generalized): Secondary | ICD-10-CM

## 2016-10-28 NOTE — Therapy (Signed)
[facility] [address] [place], [place], [address] Phone: [phone]   Fax:  [phone]  Physical Therapy Treatment  Patient Details  Name: Sharon [name] MRN: [id number] Date of Birth: December 23, 1950 Referring Provider: Dr. Durward Fortes  Encounter Date: 10/28/2016      PT End of Session - 10/28/16 1114    Visit Number 5   Number of Visits 10   Date for PT Re-Evaluation 12/04/16   Authorization Type Medicare; G codes;  KX at visit 15   PT Start Time 1108   PT Stop Time 1150   PT Time Calculation (min) 42 min   Activity Tolerance Patient tolerated treatment well   Behavior During Therapy [facility] for tasks assessed/performed      Past Medical History:  Diagnosis Date  . Arthritis    Osteoarthritis  . Fibroid   . Headache(784.0)   . Hypertension   . Ringworm   . Vitamin D deficiency     Past Surgical History:  Procedure Laterality Date  . ABDOMINAL HYSTERECTOMY  1990   TAH BSO  . APPENDECTOMY  1969  . FOOT SURGERY     bunionectomy  . KNEE SURGERY  11/2013   arthroscopic L knee  . OOPHORECTOMY     BSO  . Precancerous lesion excised    . TONSILLECTOMY    . TOTAL KNEE ARTHROPLASTY  2012  . TOTAL KNEE ARTHROPLASTY Left 04/03/2015   Procedure: TOTAL KNEE ARTHROPLASTY;  Surgeon: Garald Balding, MD;  Location: [facility];  Service: Orthopedics;  Laterality: Left;    There were no vitals filed for this visit.      Subjective Assessment - 10/28/16 1111    Subjective Pt states she has noticed the biggest difference with the dry needling   Pertinent History HTN; bilateral TKR    Limitations House hold activities   Patient Stated Goals be more painfree;  see if I can do some things with my neck to help control/relieve   Currently in Pain? Yes   Pain Score 1    Pain Location Shoulder   Pain Orientation Left   Pain Descriptors / Indicators Aching;Discomfort   Pain Type Chronic pain   Pain Onset More than a  month ago   Pain Frequency Intermittent   Effect of Pain on Daily Activities no   Multiple Pain Sites No                         [facility] Adult PT Treatment/Exercise - 10/28/16 0001      Shoulder Exercises: Prone   Retraction AROM;Left;15 reps   Flexion Strengthening;Left;15 reps   Extension AROM;Left;15 reps   Horizontal ABduction 1 AROM;Left;15 reps   Horizontal ABduction 2 AROM;Left;15 reps     Shoulder Exercises: Standing   Horizontal ABduction Strengthening;Both;20 reps;Theraband   Theraband Level (Shoulder Horizontal ABduction) Level 2 (Red)   External Rotation Strengthening;Left;20 reps;Theraband   Theraband Level (Shoulder External Rotation) Level 2 (Red)   Internal Rotation Strengthening;Left;20 reps;Theraband   Theraband Level (Shoulder Internal Rotation) Level 2 (Red)   Extension Strengthening;Both;Theraband   Theraband Level (Shoulder Extension) Level 2 (Red)   Row Strengthening;Both;20 reps;Theraband   Theraband Level (Shoulder Row) Level 2 (Red)     Iontophoresis   Type of Iontophoresis Dexamethasone   Location left shoulder   Dose 1 mg/ml #2   Time 4-6 hour patch     Manual Therapy   Manual Therapy Soft tissue mobilization;Scapular mobilization  Soft tissue mobilization right upper trap and rhomboids, subscapular    Scapular Mobilization scapular distraction                PT Education - 10/28/16 1402    Education provided Yes   Education Details prone exercises   Person(s) Educated Patient   Methods Explanation;Demonstration;Verbal cues;Handout   Comprehension Verbalized understanding;Returned demonstration          PT Short Term Goals - 10/28/16 1115      PT SHORT TERM GOAL #2   Title The patient will demonstrate improved left shoulder flexion and abduction to 155 degrees needed for overhead reaching   Baseline 153 degrees   Time 4   Period Weeks     PT SHORT TERM GOAL #3   Title The patient will have a 30%  improvement in pain with dressing and night time discomfort   Baseline 50%   Time 4   Period Weeks   Status Achieved           PT Long Term Goals - 10/23/16 1846      PT LONG TERM GOAL #1   Title The patient will be independent in safe, self progression of HEP and aquatic ex program for further strengthening, ROM and pain reduction   12/04/16   Time 8   Period Weeks   Status On-going     PT LONG TERM GOAL #2   Title The patient will have 4+/5 scapula, shoulder and cervical muscle strength needed for maintaining erect posture singing in the choir and doing arts and crafts   Time 8   Period Weeks   Status On-going     PT LONG TERM GOAL #3   Title Left shoulder flexion and abduction 160 degrees, external rotation to 85 degrees needed for dressing and reaching   Time 8   Period Weeks   Status On-going     PT LONG TERM GOAL #4   Title Patient will report a 60% improvement in pain with usual ADLs   Time 8   Period Weeks   Status On-going     PT LONG TERM GOAL #5   Title FOTO functional outcome score improved from 43% limitation to 33% indicating improved function with less pain   Time 8   Period Weeks   Status On-going               Plan - 10/28/16 1621    Clinical Impression Statement Patient had some trigger points that released with STM at infraspinatus and thoracic paraspinals.  Pt able to perform exercises in prone and educated on adding prone exercises to HEP.  Pt is demonstrating good progress,  Continues to benefit from skilled PT for scapular stability.   Rehab Potential Good   PT Frequency 2x / week   PT Treatment/Interventions ADLs/Self Care Home Management;Cryotherapy;Electrical Stimulation;Iontophoresis 4mg /ml Dexamethasone;Moist Heat;Ultrasound;Patient/family education;Therapeutic exercise;Neuromuscular re-education;Manual techniques;Dry needling;Taping   PT Next Visit Plan continue ionto trial;  dry needling as needed;  scapular strengthening including  reviewing and progressing prone exercises   Consulted and Agree with Plan of Care Patient      Patient will benefit from skilled therapeutic intervention in order to improve the following deficits and impairments:  Decreased range of motion, Decreased strength, Increased muscle spasms, Pain, Increased fascial restricitons, Postural dysfunction  Visit Diagnosis: Chronic left shoulder pain  Stiffness of left shoulder, not elsewhere classified  Muscle weakness (generalized)  Cramp and spasm     Problem List Patient Active  Problem List   Diagnosis Date Noted  . Osteoarthritis of left knee 04/03/2015  . S/P total knee replacement using cement 04/03/2015  . Gait abnormality 12/22/2012  . Swelling of ankle 12/22/2012  . Ringworm   . Hypertension   . Fibroid   . Headache(784.0)   . Arthritis   . Vitamin D deficiency     Zannie Cove, PT 10/28/2016, 5:12 PM  [facility] [address] [place], [place], [address] Phone: [phone]   Fax:  [phone]  Name: Sharon [name] MRN: [id number] Date of Birth: 1950-12-22

## 2016-10-28 NOTE — Patient Instructions (Addendum)
Scapular Retraction: Flexion (Prone)    Lie with arms forward. Pinch shoulder blades together and raise arms a few inches from floor. Repeat __15__ times per set. Do ____ sets per session. Do ____ sessions per day.  http://orth.exer.us/961   Copyright  VHI. All rights reserved.  Abduction: Horizontal - Prone (Dumbbell)    Lie with right arm hanging down. Lift arm out to side, thumb up. Repeat __15__ times per set. Do ____ sets per session. Do ____ sessions per week. Use ____ lb weight.   Copyright  VHI. All rights reserved.   Also do extension (bringing arm straight back) and rows - 15x each

## 2016-10-29 ENCOUNTER — Ambulatory Visit (HOSPITAL_BASED_OUTPATIENT_CLINIC_OR_DEPARTMENT_OTHER): Payer: PPO | Admitting: Genetic Counselor

## 2016-10-29 ENCOUNTER — Other Ambulatory Visit: Payer: PPO

## 2016-10-29 ENCOUNTER — Encounter: Payer: Self-pay | Admitting: Genetic Counselor

## 2016-10-29 DIAGNOSIS — Z8049 Family history of malignant neoplasm of other genital organs: Secondary | ICD-10-CM

## 2016-10-29 DIAGNOSIS — Z8041 Family history of malignant neoplasm of ovary: Secondary | ICD-10-CM | POA: Insufficient documentation

## 2016-10-29 DIAGNOSIS — Z7183 Encounter for nonprocreative genetic counseling: Secondary | ICD-10-CM | POA: Diagnosis not present

## 2016-10-29 DIAGNOSIS — Z800 Family history of malignant neoplasm of digestive organs: Secondary | ICD-10-CM | POA: Diagnosis not present

## 2016-10-29 NOTE — Progress Notes (Signed)
REFERRING PROVIDER: Levin Erp, MD [place], [place] [address]   Sumner Boast, MD  PRIMARY PROVIDER:  Criselda Peaches, MD  PRIMARY REASON FOR VISIT:  1. Family history of ovarian cancer   2. Family history of colon cancer   3. Family history of uterine cancer   4. Family history of stomach cancer      HISTORY OF PRESENT ILLNESS:   [name], a 66 y.o. female, was seen for a [place] cancer genetics consultation at the request of Dr. Talbert Nan due to a family history of cancer.  Sharon [name] presents to clinic today to discuss the possibility of a hereditary predisposition to cancer, genetic testing, and to further clarify her future cancer risks, as well as potential cancer risks for family members.  Sharon [name] is a 66 y.o. female with no personal history of cancer.  Sharon [name] had BRCA and del/dup genetic testing in 2014 through [facility] and is reportedly negative for any mutations.  Sharon [name] reports that her maternal first cousin also underwent BRCA testing and was negative.  She supposedly also was being worked up for [employer], but Sharon [name] does not know the results.   CANCER HISTORY:   No history exists.     HORMONAL RISK FACTORS:  Menarche was at age 61.  First live birth at age 41.  OCP use for approximately 3 years.  Ovaries intact: no.  Hysterectomy: yes.  Menopausal status: postmenopausal.  HRT use: 24 years. Colonoscopy: yes; normal. Mammogram within the last year: yes. Number of breast biopsies: 2. Up to date with pelvic exams:  yes. Any excessive radiation exposure in the past:  no  Past Medical History:  Diagnosis Date  . Arthritis    Osteoarthritis  . Family history of colon cancer   . Family history of ovarian cancer   . Family history of stomach cancer   . Family history of uterine cancer   . Fibroid   . Headache(784.0)   . Hypertension   . Ringworm   . Vitamin D deficiency     Past Surgical History:   Procedure Laterality Date  . ABDOMINAL HYSTERECTOMY  1990   TAH BSO  . APPENDECTOMY  1969  . FOOT SURGERY     bunionectomy  . KNEE SURGERY  11/2013   arthroscopic L knee  . OOPHORECTOMY     BSO  . Precancerous lesion excised    . TONSILLECTOMY    . TOTAL KNEE ARTHROPLASTY  2012  . TOTAL KNEE ARTHROPLASTY Left 04/03/2015   Procedure: TOTAL KNEE ARTHROPLASTY;  Surgeon: Garald Balding, MD;  Location: [facility];  Service: Orthopedics;  Laterality: Left;    Social History   Social History  . Marital status: Married    Spouse name: N/A  . Number of children: N/A  . Years of education: N/A   Social History Main Topics  . Smoking status: Never Smoker  . Smokeless tobacco: Never Used  . Alcohol use 1.5 oz/week    3 Standard drinks or equivalent per week  . Drug use: No  . Sexual activity: Yes    Partners: Male    Birth control/ protection: Surgical, Post-menopausal   Other Topics Concern  . None   Social History Narrative  . None     FAMILY HISTORY:  We obtained a detailed, 4-generation family history.  Significant diagnoses are listed below: Family History  Problem Relation Age of Onset  . Ovarian cancer Mother 24  .  Heart disease Maternal Grandfather   . Hypertension Paternal Grandmother   . Ovarian cancer Cousin     maternal first cousin dx in her 62s  . Heart disease Father   . Atrial fibrillation Father   . Heart disease Brother   . Heart disease Brother   . Colon cancer Maternal Aunt     dx in her 41s-60  . Diabetes Paternal Aunt   . Ovarian cancer Maternal Grandmother     dx in her mid 43s  . Cancer Maternal Grandmother     OVARIAN AND UTERINE  . Uterine cancer Maternal Grandmother     dx in her early 1s  . Stomach cancer Maternal Uncle     dx in his 36s-80s  . Stroke Maternal Uncle   . Leukemia Paternal Uncle     Sharon [name] has two sons who are cancer free.  She has three bothers who do not have cancer, and all have sons with the exception of one  brother who has one daughter.  The patient's mother was diagnosed with ovarian cancer at 103 and died at 56.  She had two brothers and a sister.  Her sister developed colon cancer in her 86's-60's and one brother (the twin to the sister) died of stomach cancer at age 50.  The sister had a daughter who died of ovarian cancer in her 41's.  Another daughter tested negative for BRCA mutations and was being worked up for [employer] syndrome.  The patient's maternal grandmother had uterine cancer in her early 15's and ovarian cancer a few years later.  Her sister's daughter had an unknown cancer and her mother had an unknown cancer.  The patient's father is alive at 49.  He had two sisters and two brothers.  One brother died of leukemia.  There Is no other reported cancer in the family.    Patient's maternal ancestors are of [place] descent, and paternal ancestors are of [place] descent. There is no reported Ashkenazi Jewish ancestry. There is no known consanguinity.  GENETIC COUNSELING ASSESSMENT: Sharon [name] is a 66 y.o. female with a family history of ovarian, colon, uterine and stomach cancer which is somewhat suggestive of a Lynch syndrome or other hereditary ovarian cancer syndrome and predisposition to cancer. We, therefore, discussed and recommended the following at today's visit.   DISCUSSION: We discussed that about 20% of ovarian cancer is hereditary with most cases due to BRCA mutations.  The patient has tested negative for BRCA mutations.  Based on the cancer in the family we are more concerned about Lynch syndrome, or possibly another ovarian cancer gene such as BRIP1, RAD51C or RAD51D.  We reviewed the characteristics, features and inheritance patterns of hereditary cancer syndromes. We also discussed genetic testing, including the appropriate family members to test, the process of testing, insurance coverage and turn-around-time for results. We discussed the implications of a negative, positive  and/or variant of uncertain significant result. We recommended Ms. Averhart pursue genetic testing for the Common Hereditary gene panel. The Hereditary Gene Panel offered by [facility] includes sequencing and/or deletion duplication testing of the following 43 genes: APC, ATM, AXIN2, BARD1, BMPR1A, BRCA1, BRCA2, BRIP1, CDH1, CDKN2A (p14ARF), CDKN2A (p16INK4a), CHEK2, DICER1, EPCAM (Deletion/duplication testing only), GREM1 (promoter region deletion/duplication testing only), KIT, MEN1, MLH1, MSH2, MSH6, MUTYH, NBN, NF1, PALB2, PDGFRA, PMS2, POLD1, POLE, PTEN, RAD50, RAD51C, RAD51D, SDHB, SDHC, SDHD, SMAD4, SMARCA4. STK11, TP53, TSC1, TSC2, and VHL.  The following gene was evaluated for sequence changes only: SDHA  and HOXB13 c.251G>A variant only.   Based on Ms. Sacco's family history of cancer, she meets medical criteria for genetic testing. Despite that she meets criteria, she may still have an out of pocket cost. We discussed that if her out of pocket cost for testing is over $100, the laboratory will call and confirm whether she wants to proceed with testing.  If the out of pocket cost of testing is less than $100 she will be billed by the genetic testing laboratory.   PLAN: After considering the risks, benefits, and limitations, Ms. Schlotzhauer  provided informed consent to pursue genetic testing and the blood sample was sent to [facility] for analysis of the Common Hereditary Cancer Panel. Results should be available within approximately 2-3 weeks' time, at which point they will be disclosed by telephone to Ms. Knapper, as will any additional recommendations warranted by these results. Ms. Agostinelli will receive a summary of her genetic counseling visit and a copy of her results once available. This information will also be available in Epic. We encouraged Ms. Pavlov to remain in contact with cancer genetics annually so that we can continuously update the family history and inform her of any changes in cancer  genetics and testing that may be of benefit for her family. Ms. Goodloe questions were answered to her satisfaction today. Our contact information was provided should additional questions or concerns arise.  Based on Ms. Lawal's family history, we recommended her maternal aunt, who was diagnosed with colon cancer, have genetic counseling and testing. Ms. Zeller will let us know if we can be of any assistance in coordinating genetic counseling and/or testing for this family member.   Lastly, we encouraged Ms. Mcphie to remain in contact with cancer genetics annually so that we can continuously update the family history and inform her of any changes in cancer genetics and testing that may be of benefit for this family.   Ms.  Alfonzo questions were answered to her satisfaction today. Our contact information was provided should additional questions or concerns arise. Thank you for the referral and allowing [place] to share in the care of your patient.   Karen P. Florene Glen, [place], [facility] Certified Genetic Counselor [email or web address] phone: [phone]  The patient was seen for a total of 60 minutes in face-to-face genetic counseling.  This patient was discussed with Drs. Magrinat, Lindi Adie and/or Burr Medico who agrees with the above.    _______________________________________________________________________ For Office Staff:  Number of people involved in session: 1 Was an Intern/ student involved with case: no

## 2016-10-30 ENCOUNTER — Encounter: Payer: Self-pay | Admitting: Physical Therapy

## 2016-10-30 ENCOUNTER — Ambulatory Visit: Payer: PPO | Admitting: Physical Therapy

## 2016-10-30 DIAGNOSIS — M6281 Muscle weakness (generalized): Secondary | ICD-10-CM

## 2016-10-30 DIAGNOSIS — M25512 Pain in left shoulder: Principal | ICD-10-CM

## 2016-10-30 DIAGNOSIS — M25612 Stiffness of left shoulder, not elsewhere classified: Secondary | ICD-10-CM

## 2016-10-30 DIAGNOSIS — G8929 Other chronic pain: Secondary | ICD-10-CM

## 2016-10-30 DIAGNOSIS — R252 Cramp and spasm: Secondary | ICD-10-CM

## 2016-10-30 NOTE — Therapy (Signed)
[facility] [address] [place], [place], [address] Phone: [phone]   Fax:  [phone]  Physical Therapy Treatment  Patient Details  Name: Sharon [name] MRN: [id number] Date of Birth: 13-Aug-1951 Referring Provider: Dr. Durward Fortes  Encounter Date: 10/30/2016      PT End of Session - 10/30/16 1450    Visit Number 6   Number of Visits 10   Date for PT Re-Evaluation 12/04/16   Authorization Type Medicare; G codes;  KX at visit 15   PT Start Time 1448   PT Stop Time 1526   PT Time Calculation (min) 38 min   Activity Tolerance Patient tolerated treatment well   Behavior During Therapy [facility] for tasks assessed/performed      Past Medical History:  Diagnosis Date  . Arthritis    Osteoarthritis  . Family history of colon cancer   . Family history of ovarian cancer   . Family history of stomach cancer   . Family history of uterine cancer   . Fibroid   . Headache(784.0)   . Hypertension   . Ringworm   . Vitamin D deficiency     Past Surgical History:  Procedure Laterality Date  . ABDOMINAL HYSTERECTOMY  1990   TAH BSO  . APPENDECTOMY  1969  . FOOT SURGERY     bunionectomy  . KNEE SURGERY  11/2013   arthroscopic L knee  . OOPHORECTOMY     BSO  . Precancerous lesion excised    . TONSILLECTOMY    . TOTAL KNEE ARTHROPLASTY  2012  . TOTAL KNEE ARTHROPLASTY Left 04/03/2015   Procedure: TOTAL KNEE ARTHROPLASTY;  Surgeon: Garald Balding, MD;  Location: [facility] [place];  Service: Orthopedics;  Laterality: Left;    There were no vitals filed for this visit.      Subjective Assessment - 10/30/16 1450    Subjective Pt states Lt shoulder along the medial border is still giving her trouble and spasming.  Noticed the 2/10 irritation this morning, but not currently bothering.     Currently in Pain? No/denies                         [facility] Adult PT Treatment/Exercise - 10/30/16 0001      Shoulder  Exercises: Prone   Retraction AROM;Left;15 reps;Weights   Retraction Weight (lbs) 2   Flexion Strengthening;Left;15 reps   Extension AROM;Left;15 reps;Weights   Extension Weight (lbs) 2   Horizontal ABduction 1 AROM;Left;15 reps   Horizontal ABduction 2 AROM;Left;15 reps     Shoulder Exercises: Standing   Horizontal ABduction Strengthening;Both;20 reps;Theraband   Theraband Level (Shoulder Horizontal ABduction) Level 2 (Red)   External Rotation Strengthening;Left;20 reps;Theraband   Theraband Level (Shoulder External Rotation) Level 2 (Red)   Internal Rotation Strengthening;Left;20 reps;Theraband   Theraband Level (Shoulder Internal Rotation) Level 3 (Green)   Extension Strengthening;Both;20 reps;Theraband   Theraband Level (Shoulder Extension) Level 2 (Red)   Row Strengthening;Both;20 reps;Theraband   Theraband Level (Shoulder Row) Level 3 (Green)   Other Standing Exercises stability ball against the wall 4 directions - 10x each way Lt UE     Iontophoresis   Type of Iontophoresis Dexamethasone   Location left shoulder   Dose 1 mg/ml #4   Time 4-6 hour patch     Manual Therapy   Soft tissue mobilization right upper trap and rhomboids, subscapular    Scapular Mobilization scapular distraction  PT Short Term Goals - 10/28/16 1115      PT SHORT TERM GOAL #2   Title The patient will demonstrate improved left shoulder flexion and abduction to 155 degrees needed for overhead reaching   Baseline 153 degrees   Time 4   Period Weeks     PT SHORT TERM GOAL #3   Title The patient will have a 30% improvement in pain with dressing and night time discomfort   Baseline 50%   Time 4   Period Weeks   Status Achieved           PT Long Term Goals - 10/23/16 1846      PT LONG TERM GOAL #1   Title The patient will be independent in safe, self progression of HEP and aquatic ex program for further strengthening, ROM and pain reduction   12/04/16   Time 8    Period Weeks   Status On-going     PT LONG TERM GOAL #2   Title The patient will have 4+/5 scapula, shoulder and cervical muscle strength needed for maintaining erect posture singing in the choir and doing arts and crafts   Time 8   Period Weeks   Status On-going     PT LONG TERM GOAL #3   Title Left shoulder flexion and abduction 160 degrees, external rotation to 85 degrees needed for dressing and reaching   Time 8   Period Weeks   Status On-going     PT LONG TERM GOAL #4   Title Patient will report a 60% improvement in pain with usual ADLs   Time 8   Period Weeks   Status On-going     PT LONG TERM GOAL #5   Title FOTO functional outcome score improved from 43% limitation to 33% indicating improved function with less pain   Time 8   Period Weeks   Status On-going               Plan - 10/30/16 1501    Clinical Impression Statement Pt wanted to review shoulder extension as she was not sure she did them correctly, but she was able to demonstrate correct form. Able to progress band resistance.  Continues to have muscle spasm.  Skilled PT needed to address impairments for improved UE function..   Rehab Potential Good   PT Frequency 2x / week   PT Duration 8 weeks   PT Treatment/Interventions ADLs/Self Care Home Management;Cryotherapy;Electrical Stimulation;Iontophoresis 4mg /ml Dexamethasone;Moist Heat;Ultrasound;Patient/family education;Therapeutic exercise;Neuromuscular re-education;Manual techniques;Dry needling;Taping   PT Next Visit Plan continue ionto trial;  dry needling as needed;  scapular strengthening including reviewing and progressing prone exercises   Consulted and Agree with Plan of Care Patient      Patient will benefit from skilled therapeutic intervention in order to improve the following deficits and impairments:  Decreased range of motion, Decreased strength, Increased muscle spasms, Pain, Increased fascial restricitons, Postural dysfunction  Visit  Diagnosis: Chronic left shoulder pain  Stiffness of left shoulder, not elsewhere classified  Muscle weakness (generalized)  Cramp and spasm     Problem List Patient Active Problem List   Diagnosis Date Noted  . Family history of ovarian cancer   . Family history of colon cancer   . Family history of uterine cancer   . Family history of stomach cancer   . Osteoarthritis of left knee 04/03/2015  . S/P total knee replacement using cement 04/03/2015  . Gait abnormality 12/22/2012  . Swelling of ankle 12/22/2012  .  Ringworm   . Hypertension   . Fibroid   . Headache(784.0)   . Arthritis   . Vitamin D deficiency     Zannie Cove, PT 10/30/2016, 3:28 PM  [facility] [address] [place], [place], [address] Phone: [phone]   Fax:  [phone]  Name: Sharon [name] MRN: [id number] Date of Birth: November 20, 1950

## 2016-11-04 ENCOUNTER — Ambulatory Visit: Payer: PPO | Admitting: Physical Therapy

## 2016-11-04 DIAGNOSIS — R252 Cramp and spasm: Secondary | ICD-10-CM

## 2016-11-04 DIAGNOSIS — M25512 Pain in left shoulder: Secondary | ICD-10-CM | POA: Diagnosis not present

## 2016-11-04 DIAGNOSIS — M25612 Stiffness of left shoulder, not elsewhere classified: Secondary | ICD-10-CM

## 2016-11-04 DIAGNOSIS — M6281 Muscle weakness (generalized): Secondary | ICD-10-CM

## 2016-11-04 DIAGNOSIS — G8929 Other chronic pain: Secondary | ICD-10-CM

## 2016-11-04 NOTE — Therapy (Signed)
[facility] [address], [place], [place], [address] Phone: [phone]   Fax:  [phone]  Physical Therapy Treatment  Patient Details  Name: Sharon [name] MRN: [id number] Date of Birth: [birth year] Referring Provider: Dr. Durward Fortes  Encounter Date: 11/04/2016      PT End of Session - 11/04/16 0921    Visit Number 7   Number of Visits 10   Authorization Type Medicare; G codes   PT Start Time 0846   PT Stop Time 0930   PT Time Calculation (min) 44 min   Activity Tolerance Patient tolerated treatment well      Past Medical History:  Diagnosis Date  . Arthritis    Osteoarthritis  . Family history of colon cancer   . Family history of ovarian cancer   . Family history of stomach cancer   . Family history of uterine cancer   . Fibroid   . Headache(784.0)   . Hypertension   . Ringworm   . Vitamin D deficiency     Past Surgical History:  Procedure Laterality Date  . ABDOMINAL HYSTERECTOMY  1990   TAH BSO  . APPENDECTOMY  1969  . FOOT SURGERY     bunionectomy  . KNEE SURGERY  11/2013   arthroscopic L knee  . OOPHORECTOMY     BSO  . Precancerous lesion excised    . TONSILLECTOMY    . TOTAL KNEE ARTHROPLASTY  2012  . TOTAL KNEE ARTHROPLASTY Left 04/03/2015   Procedure: TOTAL KNEE ARTHROPLASTY;  Surgeon: Garald Balding, MD;  Location: [place];  Service: Orthopedics;  Laterality: Left;    There were no vitals filed for this visit.      Subjective Assessment - 11/04/16 0847    Subjective It doesn't take much to get it flared up again.  I can feel it when I extend my arm.  I picked up a crock pot yesterday and I could feel it in anterior lateral shoulder.  My pectoral region gets tight too.  No night pain.   I think the patch is helping.  Mild upper trap region and rhomboid pain.     Currently in Pain? No/denies   Pain Score 0-No pain   Pain Location Shoulder   Pain Orientation Left   Pain Type  Chronic pain                         [facility] Adult PT Treatment/Exercise - 11/04/16 0001      Shoulder Exercises: Prone   Other Prone Exercises verbal review of prone ex's from last visit     Shoulder Exercises: Standing   Extension Strengthening;Both;20 reps;Theraband   Theraband Level (Shoulder Extension) Level 2 (Red)   Row Strengthening;Both;20 reps;Theraband   Theraband Level (Shoulder Row) Level 2 (Red)     Moist Heat Therapy   Number Minutes Moist Heat 6 Minutes   Moist Heat Location Shoulder;Cervical     Iontophoresis   Type of Iontophoresis Dexamethasone   Location left shoulder   Dose 1 mg/ml #5   Time 4-6 hour patch     Manual Therapy   Soft tissue mobilization left upper trap   Scapular Mobilization scapular distraction and medial and lateral glides          Trigger Point Dry Needling - 11/04/16 0920    Consent Given? Yes   Muscles Treated Upper Body Infraspinatus;Subscapularis   Upper Trapezius Response Twitch reponse elicited;Palpable increased muscle  length   Rhomboids Response Palpable increased muscle length   Infraspinatus Response Palpable increased muscle length   Subscapularis Response Palpable increased muscle length     Left only           PT Short Term Goals - 11/04/16 0937      PT SHORT TERM GOAL #1   Title The patient will demonstrate postural awareness and alignment in sitting as well as basic self care strategies   11/06/16   Status Achieved     PT SHORT TERM GOAL #2   Title The patient will demonstrate improved left shoulder flexion and abduction to 155 degrees needed for overhead reaching   Time 4   Period Weeks   Status On-going     PT SHORT TERM GOAL #3   Title The patient will have a 30% improvement in pain with dressing and night time discomfort   Status Achieved           PT Long Term Goals - 11/04/16 0937      PT LONG TERM GOAL #1   Title The patient will be independent in safe, self  progression of HEP and aquatic ex program for further strengthening, ROM and pain reduction   12/04/16   Time 8   Period Weeks   Status On-going     PT LONG TERM GOAL #2   Title The patient will have 4+/5 scapula, shoulder and cervical muscle strength needed for maintaining erect posture singing in the choir and doing arts and crafts   Time 8   Period Weeks   Status On-going     PT LONG TERM GOAL #3   Title Left shoulder flexion and abduction 160 degrees, external rotation to 85 degrees needed for dressing and reaching   Time 8   Period Weeks   Status On-going     PT LONG TERM GOAL #4   Title Patient will report a 60% improvement in pain with usual ADLs   Time 8   Period Weeks   Status On-going     PT LONG TERM GOAL #5   Title FOTO functional outcome score improved from 43% limitation to 33% indicating improved function with less pain   Time 8   Period Weeks   Status On-going               Plan - 11/04/16 [id number]    Clinical Impression Statement The patient reports intermittent left shoulder pain with horizontal adduction/abduction and endrange shoulder extension.  No night pain.  Needed verbal and tactile modification to avoid hyperextension of shoulder with theraband rows.  Significantly fewer trigger points in upper traps and rhomboids.  Good scapular mobility and improved upper trap muscle length following treatment session.  Therapist closely monitoring response with all treatment interventions.     PT Next Visit Plan last ionto next treatment then consider kinesiotaping;   recheck shoulder ROM for STG; scapular strengthening;  may add lat bar and seated row machines;  assess response to DN#2      Patient will benefit from skilled therapeutic intervention in order to improve the following deficits and impairments:     Visit Diagnosis: Chronic left shoulder pain  Stiffness of left shoulder, not elsewhere classified  Muscle weakness (generalized)  Cramp and  spasm     Problem List Patient Active Problem List   Diagnosis Date Noted  . Family history of ovarian cancer   . Family history of colon cancer   . Family history of  uterine cancer   . Family history of stomach cancer   . Osteoarthritis of left knee 04/03/2015  . S/P total knee replacement using cement 04/03/2015  . Gait abnormality 12/22/2012  . Swelling of ankle 12/22/2012  . Ringworm   . Hypertension   . Fibroid   . Headache(784.0)   . Arthritis   . Vitamin D deficiency    Ruben Im, PT 11/04/16 9:40 AM Phone: [phone] Fax: [phone]  Alvera Singh 11/04/2016, 9:38 AM  [facility] [address] [place], [place], [address] Phone: [phone]   Fax:  [phone]  Name: FRONIA HAWTHORNE MRN: [id number] Date of Birth: 1950/12/22

## 2016-11-06 ENCOUNTER — Encounter: Payer: PPO | Admitting: Physical Therapy

## 2016-11-11 ENCOUNTER — Ambulatory Visit: Payer: PPO | Admitting: Physical Therapy

## 2016-11-11 DIAGNOSIS — G8929 Other chronic pain: Secondary | ICD-10-CM

## 2016-11-11 DIAGNOSIS — R252 Cramp and spasm: Secondary | ICD-10-CM

## 2016-11-11 DIAGNOSIS — M25612 Stiffness of left shoulder, not elsewhere classified: Secondary | ICD-10-CM

## 2016-11-11 DIAGNOSIS — M25512 Pain in left shoulder: Secondary | ICD-10-CM | POA: Diagnosis not present

## 2016-11-11 DIAGNOSIS — M6281 Muscle weakness (generalized): Secondary | ICD-10-CM

## 2016-11-11 NOTE — Therapy (Signed)
[facility] [address], [place], [place], [address] Phone: [phone]   Fax:  [phone]  Physical Therapy Treatment  Patient Details  Name: Sharon [name] MRN: [id number] Date of Birth: Mar 12, 1951 Referring Provider: Dr. Durward Fortes  Encounter Date: 11/11/2016      PT End of Session - 11/11/16 1421    Visit Number 8   Number of Visits 10   Date for PT Re-Evaluation 12/04/16   Authorization Type Medicare; G codes   PT Start Time 0848   PT Stop Time 0930   PT Time Calculation (min) 42 min   Activity Tolerance Patient tolerated treatment well      Past Medical History:  Diagnosis Date  . Arthritis    Osteoarthritis  . Family history of colon cancer   . Family history of ovarian cancer   . Family history of stomach cancer   . Family history of uterine cancer   . Fibroid   . Headache(784.0)   . Hypertension   . Ringworm   . Vitamin D deficiency     Past Surgical History:  Procedure Laterality Date  . ABDOMINAL HYSTERECTOMY  1990   TAH BSO  . APPENDECTOMY  1969  . FOOT SURGERY     bunionectomy  . KNEE SURGERY  11/2013   arthroscopic L knee  . OOPHORECTOMY     BSO  . Precancerous lesion excised    . TONSILLECTOMY    . TOTAL KNEE ARTHROPLASTY  2012  . TOTAL KNEE ARTHROPLASTY Left 04/03/2015   Procedure: TOTAL KNEE ARTHROPLASTY;  Surgeon: Garald Balding, MD;  Location: [facility];  Service: Orthopedics;  Laterality: Left;    There were no vitals filed for this visit.      Subjective Assessment - 11/11/16 0848    Subjective Heading up [place] after PT session today to help out family members.  Feeling not too bad.  Difficulty with prone Y raises.  Discussed modification.  Mild shoulder blade pain from lifting while packing this morning.     Currently in Pain? Yes   Pain Score 2    Pain Location Scapula   Pain Orientation Left   Pain Type Chronic pain            [facility] PT Assessment -  11/11/16 0001      AROM   Left Shoulder Flexion 165 Degrees   Left Shoulder ABduction 163 Degrees   Left Shoulder Internal Rotation --  T6   Left Shoulder External Rotation 80 Degrees   Cervical - Right Side Bend 45   Cervical - Left Side Bend 45                     [facility] Adult PT Treatment/Exercise - 11/11/16 0001      Shoulder Exercises: ROM/Strengthening   UBE (Upper Arm Bike) 3x3  ball behind mid back     Shoulder Exercises: Power Development worker, community 15 reps   Extension Limitations lat bar 20#   Row 15 reps   Row Limitations 20#     Moist Heat Therapy   Number Minutes Moist Heat 4 Minutes   Moist Heat Location Shoulder;Cervical     Iontophoresis   Type of Iontophoresis Dexamethasone   Location left shoulder   Dose 1 mg/ml #6   Time 4-6 hour patch     Manual Therapy   Soft tissue mobilization left upper trap   Scapular Mobilization --  Trigger Point Dry Needling - 11/11/16 [id number]    Rhomboids Response Palpable increased muscle length   Subscapularis Response Palpable increased muscle length                PT Short Term Goals - 11/11/16 1426      PT SHORT TERM GOAL #1   Title The patient will demonstrate postural awareness and alignment in sitting as well as basic self care strategies   11/06/16   Status Achieved     PT SHORT TERM GOAL #2   Title The patient will demonstrate improved left shoulder flexion and abduction to 155 degrees needed for overhead reaching   Status Achieved     PT SHORT TERM GOAL #3   Title The patient will have a 30% improvement in pain with dressing and night time discomfort   Status Achieved           PT Long Term Goals - 11/11/16 1427      PT LONG TERM GOAL #1   Title The patient will be independent in safe, self progression of HEP and aquatic ex program for further strengthening, ROM and pain reduction   12/04/16   Time 8   Period Weeks   Status On-going     PT LONG TERM GOAL #2   Title  The patient will have 4+/5 scapula, shoulder and cervical muscle strength needed for maintaining erect posture singing in the choir and doing arts and crafts   Time 8   Period Weeks   Status On-going     PT LONG TERM GOAL #3   Title Left shoulder flexion and abduction 160 degrees, external rotation to 85 degrees needed for dressing and reaching   Time 8   Period Weeks   Status On-going     PT LONG TERM GOAL #4   Title Patient will report a 60% improvement in pain with usual ADLs   Time 8   Period Weeks   Status On-going     PT LONG TERM GOAL #5   Title FOTO functional outcome score improved from 43% limitation to 33% indicating improved function with less pain   Time 8   Period Weeks   Status On-going               Plan - 11/11/16 1422    Clinical Impression Statement The patient has good improvements in shoulder flexion and abduction ROM (painfree).  She has intermittent pain primarily with lifting.  She has had discomfort with shoulder prone diagonal lifts and discussed modification to only lift 30 degrees.  Decreased tender points in upper traps but several points in rhomboids.  Improved muscle length following treatment session.  Completed iontophoresis trial.  Therapist closely monitoring response with all treatment interventions.     PT Next Visit Plan patient out of town 2 weeks;  upon return consider kinesiotaping or McConnell tape;  scapular strengthening;  lat bar and seated row;  manual techniques and dry needling as needed      Patient will benefit from skilled therapeutic intervention in order to improve the following deficits and impairments:     Visit Diagnosis: Chronic left shoulder pain  Stiffness of left shoulder, not elsewhere classified  Muscle weakness (generalized)  Cramp and spasm     Problem List Patient Active Problem List   Diagnosis Date Noted  . Family history of ovarian cancer   . Family history of colon cancer   . Family history  of uterine cancer   .  Family history of stomach cancer   . Osteoarthritis of left knee 04/03/2015  . S/P total knee replacement using cement 04/03/2015  . Gait abnormality 12/22/2012  . Swelling of ankle 12/22/2012  . Ringworm   . Hypertension   . Fibroid   . Headache(784.0)   . Arthritis   . Vitamin D deficiency     Ruben Im, PT 11/11/16 2:29 PM Phone: [phone] Fax: [phone]  Alvera Singh 11/11/2016, 2:28 PM  [facility] [address] [place], [place], [address] Phone: [phone]   Fax:  [phone]  Name: Sharon [name] MRN: [id number] Date of Birth: [birth year]

## 2016-11-13 ENCOUNTER — Encounter: Payer: PPO | Admitting: Physical Therapy

## 2016-11-14 ENCOUNTER — Encounter: Payer: Self-pay | Admitting: Genetic Counselor

## 2016-11-14 ENCOUNTER — Telehealth: Payer: Self-pay | Admitting: Genetic Counselor

## 2016-11-14 DIAGNOSIS — Z1379 Encounter for other screening for genetic and chromosomal anomalies: Secondary | ICD-10-CM | POA: Insufficient documentation

## 2016-11-14 NOTE — Telephone Encounter (Signed)
LM on both home and mobile VM with good news.  Asked that she CB and left CB number.

## 2016-11-14 NOTE — Telephone Encounter (Signed)
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  Patient voiced her understanding. Discussed that others in the family could undergo genetic testing based on family history.

## 2016-11-18 ENCOUNTER — Encounter: Payer: PPO | Admitting: Physical Therapy

## 2016-11-18 ENCOUNTER — Ambulatory Visit: Payer: Self-pay | Admitting: Genetic Counselor

## 2016-11-18 DIAGNOSIS — Z8049 Family history of malignant neoplasm of other genital organs: Secondary | ICD-10-CM

## 2016-11-18 DIAGNOSIS — Z800 Family history of malignant neoplasm of digestive organs: Secondary | ICD-10-CM

## 2016-11-18 DIAGNOSIS — Z1379 Encounter for other screening for genetic and chromosomal anomalies: Secondary | ICD-10-CM

## 2016-11-18 DIAGNOSIS — Z8041 Family history of malignant neoplasm of ovary: Secondary | ICD-10-CM

## 2016-11-18 NOTE — Progress Notes (Signed)
HPI: Sharon [name] was previously seen in the [facility] clinic due to a family history of cancer and concerns regarding a hereditary predisposition to cancer. Please refer to our prior cancer genetics clinic note for more information regarding Sharon [name]'s medical, social and family histories, and our assessment and recommendations, at the time. Sharon [name] recent genetic test results were disclosed to her, as were recommendations warranted by these results. These results and recommendations are discussed in more detail below.  CANCER HISTORY:   No history exists.    FAMILY HISTORY:  We obtained a detailed, 4-generation family history.  Significant diagnoses are listed below: Family History  Problem Relation Age of Onset  . Ovarian cancer Mother 77  . Heart disease Maternal Grandfather   . Hypertension Paternal Grandmother   . Ovarian cancer Cousin     maternal first cousin dx in her 58s  . Heart disease Father   . Atrial fibrillation Father   . Heart disease Brother   . Heart disease Brother   . Colon cancer Maternal Aunt     dx in her 24s-60  . Diabetes Paternal Aunt   . Ovarian cancer Maternal Grandmother     dx in her mid 44s  . Cancer Maternal Grandmother     OVARIAN AND UTERINE  . Uterine cancer Maternal Grandmother     dx in her early 49s  . Stomach cancer Maternal Uncle     dx in his 58s-80s  . Stroke Maternal Uncle   . Leukemia Paternal Uncle     Sharon [name] has two sons who are cancer free.  She has three bothers who do not have cancer, and all have sons with the exception of one brother who has one daughter.  The patient's mother was diagnosed with ovarian cancer at 60 and died at 25.  She had two brothers and a sister.  Her sister developed colon cancer in her 74's-60's and one brother (the twin to the sister) died of stomach cancer at age 74.  The sister had a daughter who died of ovarian cancer in her 25's.  Another daughter tested negative for BRCA  mutations and was being worked up for [employer] syndrome.  The patient's maternal grandmother had uterine cancer in her early 51's and ovarian cancer a few years later.  Her sister's daughter had an unknown cancer and her mother had an unknown cancer.  The patient's father is alive at 100.  He had two sisters and two brothers.  One brother died of leukemia.  There Is no other reported cancer in the family.    Patient's maternal ancestors are of [place] descent, and paternal ancestors are of [place] descent. There is no reported Ashkenazi Jewish ancestry. There is no known consanguinity.  GENETIC TEST RESULTS: Genetic testing reported out on November 03, 2016 through the Hereditary Common cancer panel found no deleterious mutations.  The Hereditary Gene Panel offered by [facility] includes sequencing and/or deletion duplication testing of the following 43 genes: APC, ATM, AXIN2, BARD1, BMPR1A, BRCA1, BRCA2, BRIP1, CDH1, CDKN2A (p14ARF), CDKN2A (p16INK4a), CHEK2, DICER1, EPCAM (Deletion/duplication testing only), GREM1 (promoter region deletion/duplication testing only), KIT, MEN1, MLH1, MSH2, MSH6, MUTYH, NBN, NF1, PALB2, PDGFRA, PMS2, POLD1, POLE, PTEN, RAD50, RAD51C, RAD51D, SDHB, SDHC, SDHD, SMAD4, SMARCA4. STK11, TP53, TSC1, TSC2, and VHL.  The following gene was evaluated for sequence changes only: SDHA and HOXB13 c.251G>A variant only. The test report has been scanned into EPIC and is located under the Molecular Pathology section  of the Results Review tab.   We discussed with Sharon [name] that since the current genetic testing is not perfect, it is possible there may be a gene mutation in one of these genes that current testing cannot detect, but that chance is small. We also discussed, that it is possible that another gene that has not yet been discovered, or that we have not yet tested, is responsible for the cancer diagnoses in the family, and it is, therefore, important to remain in touch with cancer  genetics in the future so that we can continue to offer [name] the most up to date genetic testing.   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 At this time, it is unknown if this variant is associated with increased cancer risk or if this is a normal finding, but most variants such as this get reclassified to being inconsequential. It should not be used to make medical management decisions. With time, we suspect the lab will determine the significance of this variant, if any. If we do learn more about it, we will try to contact Sharon [name] to discuss it further. However, it is important to stay in touch with [place] periodically and keep the address and phone number up to date.   CANCER SCREENING RECOMMENDATIONS: Given [name]'s personal and family histories, we must interpret these negative results with some caution.  Families with features suggestive of hereditary risk for cancer tend to have multiple family members with cancer, diagnoses in multiple generations and diagnoses before the age of 37. Sharon [name] family exhibits some of these features. Thus this result may simply reflect our current inability to detect all mutations within these genes or there may be a different gene that has not yet been discovered or tested.   RECOMMENDATIONS FOR FAMILY MEMBERS: Women in this family might be at some increased risk of developing cancer, over the general population risk, simply due to the family history of cancer. We recommended women in this family have a yearly mammogram beginning at age 83, or 23 years younger than the earliest onset of cancer, an annual clinical breast exam, and perform monthly breast self-exams. Women in this family should also have a gynecological exam as recommended by their primary provider. All family members should have a colonoscopy by age 19.  Based on Sharon [name]'s family history, we recommended her brothers have genetic  counseling and testing, based on the family history of ovarian cancer. Sharon [name] will let us know if we can be of any assistance in coordinating genetic counseling and/or testing for this family member.   FOLLOW-UP: Lastly, we discussed with Ms. Shepheard that cancer genetics is a rapidly advancing field and it is possible that new genetic tests will be appropriate for her and/or her family members in the future. We encouraged her to remain in contact with cancer genetics on an annual basis so we can update her personal and family histories and let her know of advances in cancer genetics that may benefit this family.   Our contact number was provided. Ms. Casler questions were answered to her satisfaction, and she knows she is welcome to call us at anytime with additional questions or concerns.   Roma Kayser, MS, [facility] Certified Genetic Counselor [email or web address]

## 2016-11-20 ENCOUNTER — Encounter: Payer: PPO | Admitting: Physical Therapy

## 2016-11-25 ENCOUNTER — Ambulatory Visit: Payer: PPO | Attending: Orthopaedic Surgery | Admitting: Physical Therapy

## 2016-11-25 DIAGNOSIS — M6281 Muscle weakness (generalized): Secondary | ICD-10-CM | POA: Diagnosis not present

## 2016-11-25 DIAGNOSIS — G8929 Other chronic pain: Secondary | ICD-10-CM | POA: Diagnosis not present

## 2016-11-25 DIAGNOSIS — I10 Essential (primary) hypertension: Secondary | ICD-10-CM | POA: Diagnosis not present

## 2016-11-25 DIAGNOSIS — R252 Cramp and spasm: Secondary | ICD-10-CM | POA: Diagnosis not present

## 2016-11-25 DIAGNOSIS — M25512 Pain in left shoulder: Secondary | ICD-10-CM | POA: Diagnosis not present

## 2016-11-25 DIAGNOSIS — M25612 Stiffness of left shoulder, not elsewhere classified: Secondary | ICD-10-CM | POA: Insufficient documentation

## 2016-11-25 NOTE — Therapy (Signed)
[facility] [address], [place], [place], [address] Phone: [phone]   Fax:  [phone]  Physical Therapy Treatment  Patient Details  Name: Sharon [name] MRN: [id number] Date of Birth: 1951/03/06 Referring Provider: Dr. Durward Fortes  Encounter Date: 11/25/2016      PT End of Session - 11/25/16 1441    Visit Number 9   Number of Visits 10   Date for PT Re-Evaluation 12/04/16   Authorization Type Medicare; G codes   PT Start Time 0845   PT Stop Time 0930   PT Time Calculation (min) 45 min   Activity Tolerance Patient tolerated treatment well      Past Medical History:  Diagnosis Date  . Arthritis    Osteoarthritis  . Family history of colon cancer   . Family history of ovarian cancer   . Family history of stomach cancer   . Family history of uterine cancer   . Fibroid   . Headache(784.0)   . Hypertension   . Ringworm   . Vitamin D deficiency     Past Surgical History:  Procedure Laterality Date  . ABDOMINAL HYSTERECTOMY  1990   TAH BSO  . APPENDECTOMY  1969  . FOOT SURGERY     bunionectomy  . KNEE SURGERY  11/2013   arthroscopic L knee  . OOPHORECTOMY     BSO  . Precancerous lesion excised    . TONSILLECTOMY    . TOTAL KNEE ARTHROPLASTY  2012  . TOTAL KNEE ARTHROPLASTY Left 04/03/2015   Procedure: TOTAL KNEE ARTHROPLASTY;  Surgeon: Garald Balding, MD;  Location: [facility];  Service: Orthopedics;  Laterality: Left;    There were no vitals filed for this visit.      Subjective Assessment - 11/25/16 0849    Subjective Traveling went ok although reaching into the back seat cranked it up again.  Anterior shoulder and left scapula.     Currently in Pain? Yes   Pain Score 3    Pain Location Shoulder   Pain Frequency Intermittent   Pain Type Chronic pain   Aggravating Factors  lying on left side;  reaching into the back seat.                           [facility] Adult PT  Treatment/Exercise - 11/25/16 0001      Shoulder Exercises: ROM/Strengthening   UBE (Upper Arm Bike) 3x3  ball behind mid back     Shoulder Exercises: Power Development worker, community 15 reps   Extension Limitations lat bar 20#   Row 15 reps   Row Limitations 20#     Manual Therapy   Soft tissue mobilization left upper trap   Scapular Mobilization scapular distraction and medial and lateral glides   Kinesiotex --  Y strip for deltoid unloading and Y strip from ant shoulder           Trigger Point Dry Needling - 11/25/16 1441    Upper Trapezius Response Twitch reponse elicited;Palpable increased muscle length   Rhomboids Response Palpable increased muscle length   Infraspinatus Response Palpable increased muscle length   Subscapularis Response Palpable increased muscle length        Left only        PT Short Term Goals - 11/25/16 1550      PT SHORT TERM GOAL #1   Title The patient will demonstrate postural awareness and alignment in sitting  as well as basic self care strategies   11/06/16   Status Achieved     PT SHORT TERM GOAL #2   Title The patient will demonstrate improved left shoulder flexion and abduction to 155 degrees needed for overhead reaching   Status Achieved     PT SHORT TERM GOAL #3   Title The patient will have a 30% improvement in pain with dressing and night time discomfort   Status Achieved           PT Long Term Goals - 11/25/16 1550      PT LONG TERM GOAL #1   Title The patient will be independent in safe, self progression of HEP and aquatic ex program for further strengthening, ROM and pain reduction   12/04/16   Time 8   Period Weeks   Status On-going     PT LONG TERM GOAL #2   Title The patient will have 4+/5 scapula, shoulder and cervical muscle strength needed for maintaining erect posture singing in the choir and doing arts and crafts   Time 8   Period Weeks   Status On-going     PT LONG TERM GOAL #3   Title Left shoulder flexion  and abduction 160 degrees, external rotation to 85 degrees needed for dressing and reaching   Time 8   Period Weeks   Status On-going     PT LONG TERM GOAL #4   Title Patient will report a 60% improvement in pain with usual ADLs   Time 8   Period Weeks   Status On-going     PT LONG TERM GOAL #5   Title FOTO functional outcome score improved from 43% limitation to 33% indicating improved function with less pain   Time 8   Period Weeks   Status On-going               Plan - 11/25/16 1442    Clinical Impression Statement The patient reports decreased pain overall but still has times when her shoulder is easily aggravated (reaching from the front seat of the car to the back seat).  She states she does seem to recover faster than she used to when her shoulder gets aggravated.  Some discomfort with shoulder hyperextension and modified to go to neutral and stop during ex.  Improved scapular mobility following dry needling and manual therapy.  Assess response to kinesiotaping next visit.      PT Next Visit Plan assess response to taping; ther ex, manual techniques;  FOTO;  G code;  recheck ROM/MMT      Patient will benefit from skilled therapeutic intervention in order to improve the following deficits and impairments:     Visit Diagnosis: Chronic left shoulder pain  Stiffness of left shoulder, not elsewhere classified  Muscle weakness (generalized)  Cramp and spasm     Problem List Patient Active Problem List   Diagnosis Date Noted  . Genetic testing 11/14/2016  . Family history of ovarian cancer   . Family history of colon cancer   . Family history of uterine cancer   . Family history of stomach cancer   . Osteoarthritis of left knee 04/03/2015  . S/P total knee replacement using cement 04/03/2015  . Gait abnormality 12/22/2012  . Swelling of ankle 12/22/2012  . Ringworm   . Hypertension   . Fibroid   . Headache(784.0)   . Arthritis   . Vitamin D deficiency     Ruben Im, PT 11/25/16 3:52 PM  Phone: [phone] Fax: [phone]  Alvera Singh 11/25/2016, 3:52 PM  [facility] [address] [place], [place], [address] Phone: [phone]   Fax:  [phone]  Name: Sharon [name] MRN: [id number] Date of Birth: [birth year]

## 2016-11-27 ENCOUNTER — Ambulatory Visit: Payer: PPO | Admitting: Physical Therapy

## 2016-11-27 DIAGNOSIS — G8929 Other chronic pain: Secondary | ICD-10-CM

## 2016-11-27 DIAGNOSIS — M6281 Muscle weakness (generalized): Secondary | ICD-10-CM

## 2016-11-27 DIAGNOSIS — R252 Cramp and spasm: Secondary | ICD-10-CM

## 2016-11-27 DIAGNOSIS — M25512 Pain in left shoulder: Principal | ICD-10-CM

## 2016-11-27 DIAGNOSIS — M25612 Stiffness of left shoulder, not elsewhere classified: Secondary | ICD-10-CM

## 2016-11-27 NOTE — Therapy (Signed)
[facility] [address] [place], [place], [address] Phone: [phone]   Fax:  [phone]  Physical Therapy Treatment  Patient Details  Name: Sharon [name] MRN: [id number] Date of Birth: 1950-12-13 Referring Provider: Dr. Durward Fortes  Encounter Date: 11/27/2016      PT End of Session - 11/27/16 0947    Visit Number 10   Number of Visits 20   Date for PT Re-Evaluation 12/04/16   Authorization Type Medicare; G codes   PT Start Time 0845   PT Stop Time 0930   PT Time Calculation (min) 45 min   Activity Tolerance Patient tolerated treatment well      Past Medical History:  Diagnosis Date  . Arthritis    Osteoarthritis  . Family history of colon cancer   . Family history of ovarian cancer   . Family history of stomach cancer   . Family history of uterine cancer   . Fibroid   . Headache(784.0)   . Hypertension   . Ringworm   . Vitamin D deficiency     Past Surgical History:  Procedure Laterality Date  . ABDOMINAL HYSTERECTOMY  1990   TAH BSO  . APPENDECTOMY  1969  . FOOT SURGERY     bunionectomy  . KNEE SURGERY  11/2013   arthroscopic L knee  . OOPHORECTOMY     BSO  . Precancerous lesion excised    . TONSILLECTOMY    . TOTAL KNEE ARTHROPLASTY  2012  . TOTAL KNEE ARTHROPLASTY Left 04/03/2015   Procedure: TOTAL KNEE ARTHROPLASTY;  Surgeon: Garald Balding, MD;  Location: [place];  Service: Orthopedics;  Laterality: Left;    There were no vitals filed for this visit.      Subjective Assessment - 11/27/16 0847    Subjective I don't do anything rigorous.  I will be testing it out this Saturday at water aerobics.  Reaching backwards would still be painful.  I feel good this morning.     Currently in Pain? No/denies   Pain Score 0-No pain   Pain Location Shoulder   Pain Orientation Left   Pain Type Chronic pain   Pain Onset More than a month ago   Pain Frequency Intermittent   Aggravating  Factors  reaching into the back seat            [facility] PT Assessment - 11/27/16 0001      Observation/Other Assessments   Focus on Therapeutic Outcomes (FOTO)  38% limitation     AROM   Left Shoulder Flexion 165 Degrees   Left Shoulder ABduction 163 Degrees   Cervical - Right Side Bend 40   Cervical - Left Side Bend 50   Cervical - Right Rotation 50   Cervical - Left Rotation 45     Strength   Left Shoulder Flexion 4/5   Left Shoulder Extension 4/5   Left Shoulder ABduction 4/5   Left Shoulder Internal Rotation 4/5   Left Shoulder External Rotation 4/5   Left Shoulder Horizontal ABduction 4/5   Left Shoulder Horizontal ADduction 4/5                     [facility] Adult PT Treatment/Exercise - 11/27/16 0001      Shoulder Exercises: Standing   Extension Strengthening;Left;20 reps;Theraband   Theraband Level (Shoulder Extension) Level 3 (Green)   Other Standing Exercises diagonal extensions D1/D2 patterns green band 15x each   Other Standing Exercises small ball  on wall circles clock and counterwise 15x each     Shoulder Exercises: ROM/Strengthening   UBE (Upper Arm Bike) 3x3  ball behind mid back   Cybex Row 1.5 plate   Cybex Row Limitations bilateral 20x     Manual Therapy   Kinesiotex --  Y strip for deltoid unloading and Y strip from ant shoulder                   PT Short Term Goals - 17-Dec-2016 1319      PT SHORT TERM GOAL #1   Title The patient will demonstrate postural awareness and alignment in sitting as well as basic self care strategies   11/06/16   Status Achieved     PT SHORT TERM GOAL #2   Title The patient will demonstrate improved left shoulder flexion and abduction to 155 degrees needed for overhead reaching   Status Achieved     PT SHORT TERM GOAL #3   Title The patient will have a 30% improvement in pain with dressing and night time discomfort   Status Achieved           PT Long Term Goals - 2016-12-17 1319      PT  LONG TERM GOAL #1   Title The patient will be independent in safe, self progression of HEP and aquatic ex program for further strengthening, ROM and pain reduction   12/04/16   Time 8   Period Weeks   Status On-going     PT LONG TERM GOAL #2   Title The patient will have 4+/5 scapula, shoulder and cervical muscle strength needed for maintaining erect posture singing in the choir and doing arts and crafts   Time 8   Period Weeks   Status On-going     PT LONG TERM GOAL #3   Title Left shoulder flexion and abduction 160 degrees, external rotation to 85 degrees needed for dressing and reaching   Time 8   Period Weeks   Status On-going     PT LONG TERM GOAL #4   Title Patient will report a 60% improvement in pain with usual ADLs   Time 8   Period Weeks   Status On-going     PT LONG TERM GOAL #5   Title FOTO functional outcome score improved from 43% limitation to 33% indicating improved function with less pain   Time 8   Period Weeks   Status On-going               Plan - 2016-12-17 1308    Clinical Impression Statement The patient's FOTO functional outcome score has improved from 43% limitation to 38% limitation.  Her shoulder and cervical ROM has improved but still slightly limited with elevation of the shoulder.  She needs verbal and tactile cues to avoid hyperextension which is still an aggravating factor.  Minimal pain today.  Therapist closely monitoring response with all treatment interventions.     PT Next Visit Plan assess response to taping for aquatic ex class;  dry needling as needed;   ther ex, manual techniques;  UE Ranger on wall for 160 degrees elevation      Patient will benefit from skilled therapeutic intervention in order to improve the following deficits and impairments:     Visit Diagnosis: Chronic left shoulder pain  Stiffness of left shoulder, not elsewhere classified  Muscle weakness (generalized)  Cramp and spasm       G-Codes - 2016/12/17  1321  Functional Assessment Tool Used FOTO; clinical judgement    Functional Limitation Carrying, moving and handling objects   Carrying, Moving and Handling Objects Current Status [phone]) At least 40 percent but less than 60 percent impaired, limited or restricted   Carrying, Moving and Handling Objects Goal Status [id number]) At least 20 percent but less than 40 percent impaired, limited or restricted      Problem List Patient Active Problem List   Diagnosis Date Noted  . Genetic testing 11/14/2016  . Family history of ovarian cancer   . Family history of colon cancer   . Family history of uterine cancer   . Family history of stomach cancer   . Osteoarthritis of left knee 04/03/2015  . S/P total knee replacement using cement 04/03/2015  . Gait abnormality 12/22/2012  . Swelling of ankle 12/22/2012  . Ringworm   . Hypertension   . Fibroid   . Headache(784.0)   . Arthritis   . Vitamin D deficiency    Ruben Im, PT 11/27/16 1:22 PM Phone: [phone] Fax: [phone]  Alvera Singh 11/27/2016, 1:22 PM  [facility] [address] [place], [place], [address] Phone: [phone]   Fax:  [phone]  Name: PAUL MOGUL MRN: [id number] Date of Birth: February 06, 1951

## 2016-12-02 ENCOUNTER — Ambulatory Visit: Payer: PPO | Admitting: Physical Therapy

## 2016-12-04 ENCOUNTER — Ambulatory Visit: Payer: PPO | Admitting: Physical Therapy

## 2016-12-04 DIAGNOSIS — M6281 Muscle weakness (generalized): Secondary | ICD-10-CM

## 2016-12-04 DIAGNOSIS — M25512 Pain in left shoulder: Principal | ICD-10-CM

## 2016-12-04 DIAGNOSIS — G8929 Other chronic pain: Secondary | ICD-10-CM

## 2016-12-04 DIAGNOSIS — M25612 Stiffness of left shoulder, not elsewhere classified: Secondary | ICD-10-CM

## 2016-12-04 DIAGNOSIS — R252 Cramp and spasm: Secondary | ICD-10-CM

## 2016-12-04 NOTE — Therapy (Signed)
[facility] [address], [place], [place], [address] Phone: [phone]   Fax:  [phone]  Physical Therapy Treatment/Recertification  Patient Details  Name: Sharon [name] MRN: [id number] Date of Birth: Nov 24, 1950 Referring Provider: Dr. Durward Fortes  Encounter Date: 12/04/2016      PT End of Session - 12/04/16 1355    Visit Number 11   Number of Visits 20   Date for PT Re-Evaluation 01/01/17   Authorization Type Medicare; G codes      Past Medical History:  Diagnosis Date  . Arthritis    Osteoarthritis  . Family history of colon cancer   . Family history of ovarian cancer   . Family history of stomach cancer   . Family history of uterine cancer   . Fibroid   . Headache(784.0)   . Hypertension   . Ringworm   . Vitamin D deficiency     Past Surgical History:  Procedure Laterality Date  . ABDOMINAL HYSTERECTOMY  1990   TAH BSO  . APPENDECTOMY  1969  . FOOT SURGERY     bunionectomy  . KNEE SURGERY  11/2013   arthroscopic L knee  . OOPHORECTOMY     BSO  . Precancerous lesion excised    . TONSILLECTOMY    . TOTAL KNEE ARTHROPLASTY  2012  . TOTAL KNEE ARTHROPLASTY Left 04/03/2015   Procedure: TOTAL KNEE ARTHROPLASTY;  Surgeon: Garald Balding, MD;  Location: [place];  Service: Orthopedics;  Laterality: Left;    There were no vitals filed for this visit.      Subjective Assessment - 12/04/16 0853    Subjective I didn't come on Tuesday that b/c I thought I was coming down with something but I'm better now.  My shoulder has turned a corner.  I was able to do water aerobics with only one modification.     Currently in Pain? No/denies   Pain Score 0-No pain  just minor soreness            [facility] PT Assessment - 12/04/16 0001      Observation/Other Assessments   Focus on Therapeutic Outcomes (FOTO)  38% limitation     AROM   Left Shoulder Flexion 165 Degrees   Left Shoulder ABduction 163  Degrees   Cervical - Right Side Bend 40   Cervical - Left Side Bend 50   Cervical - Right Rotation 50   Cervical - Left Rotation 45     Strength   Left Shoulder Flexion 4+/5   Left Shoulder Extension 4+/5                     [facility] Adult PT Treatment/Exercise - 12/04/16 0001      Shoulder Exercises: Prone   Other Prone Exercises verbal review of prone ex's from last visit     Shoulder Exercises: Standing   External Rotation Strengthening;Left;20 reps;Theraband   Theraband Level (Shoulder External Rotation) Level 3 (Green)   Internal Rotation Strengthening;Left;20 reps;Theraband   Theraband Level (Shoulder Internal Rotation) Level 3 (Green)   Extension Strengthening;Left;20 reps;Theraband   Theraband Level (Shoulder Extension) Level 3 (Green)   Other Standing Exercises diagonal extensions D1/D2 patterns green band 15x each     Shoulder Exercises: ROM/Strengthening   UBE (Upper Arm Bike) 3x3  ball behind mid back   Other ROM/Strengthening Exercises UE Ranger on wall level 10, 15, 20, 25 (10 reps each)     Manual Therapy   Soft  tissue mobilization rhomboids, subscapularis, infraspinatus          Trigger Point Dry Needling - 12/04/16 1355    Consent Given? Yes   Rhomboids Response Palpable increased muscle length   Infraspinatus Response Palpable increased muscle length   Subscapularis Response Palpable increased muscle length                PT Short Term Goals - 12/04/16 1848      PT SHORT TERM GOAL #1   Title The patient will demonstrate postural awareness and alignment in sitting as well as basic self care strategies   11/06/16   Status Achieved     PT SHORT TERM GOAL #2   Title The patient will demonstrate improved left shoulder flexion and abduction to 155 degrees needed for overhead reaching   Status Achieved     PT SHORT TERM GOAL #3   Title The patient will have a 30% improvement in pain with dressing and night time discomfort   Status  Achieved           PT Long Term Goals - 12/04/16 0902      PT LONG TERM GOAL #1   Title The patient will be independent in safe, self progression of HEP and aquatic ex program for further strengthening, ROM and pain reduction   12/04/16   Time 8   Period Weeks   Status On-going     PT LONG TERM GOAL #2   Title The patient will have 4+/5 scapula, shoulder and cervical muscle strength needed for maintaining erect posture singing in the choir and doing arts and crafts   Time 8   Period Weeks   Status On-going     PT LONG TERM GOAL #3   Title Left shoulder flexion and abduction 160 degrees, external rotation to 85 degrees needed for dressing and reaching   Status Achieved     PT LONG TERM GOAL #4   Title Patient will report a 60% improvement in pain with usual ADLs   Baseline 80% better on 2/15   Status Achieved     PT LONG TERM GOAL #5   Title FOTO functional outcome score improved from 43% limitation to 33% indicating improved function with less pain   Time 8   Period Weeks   Status On-going               Plan - 12/04/16 1846    Clinical Impression Statement The patient is progressing with pain reduction, intensity and frequency of pain.  Decreased tender points in cervical, shoulder and scapular musculature.  Able to increased resistance of elastic band without exacerbation of pain.  Therapist closely monitoring response with all.    PT Next Visit Plan UE Ranger on wall for 160 degrees elevation, review HEP as needed for possible discharge;  check remaining goals,  recheck FOTO and shoulder/cervical ROM and MMT      Patient will benefit from skilled therapeutic intervention in order to improve the following deficits and impairments:     Visit Diagnosis: Chronic left shoulder pain - Plan: PT plan of care cert/re-cert  Stiffness of left shoulder, not elsewhere classified - Plan: PT plan of care cert/re-cert  Muscle weakness (generalized) - Plan: PT plan of care  cert/re-cert  Cramp and spasm - Plan: PT plan of care cert/re-cert     Problem List Patient Active Problem List   Diagnosis Date Noted  . Genetic testing 11/14/2016  . Family history of ovarian cancer   .  Family history of colon cancer   . Family history of uterine cancer   . Family history of stomach cancer   . Osteoarthritis of left knee 04/03/2015  . S/P total knee replacement using cement 04/03/2015  . Gait abnormality 12/22/2012  . Swelling of ankle 12/22/2012  . Ringworm   . Hypertension   . Fibroid   . Headache(784.0)   . Arthritis   . Vitamin D deficiency    Ruben Im, PT 12/04/16 6:56 PM Phone: [phone] Fax: [phone]  Alvera Singh 12/04/2016, 6:55 PM  [facility] [address], [place], [place], [address] Phone: [phone]   Fax:  [phone]  Name: JETZABEL LASSERE MRN: [id number] Date of Birth: November 09, 1950

## 2016-12-09 ENCOUNTER — Ambulatory Visit: Payer: PPO | Admitting: Physical Therapy

## 2016-12-09 DIAGNOSIS — M25612 Stiffness of left shoulder, not elsewhere classified: Secondary | ICD-10-CM

## 2016-12-09 DIAGNOSIS — M25512 Pain in left shoulder: Principal | ICD-10-CM

## 2016-12-09 DIAGNOSIS — M6281 Muscle weakness (generalized): Secondary | ICD-10-CM

## 2016-12-09 DIAGNOSIS — R252 Cramp and spasm: Secondary | ICD-10-CM

## 2016-12-09 DIAGNOSIS — G8929 Other chronic pain: Secondary | ICD-10-CM

## 2016-12-09 NOTE — Patient Instructions (Signed)
  Lying face down, towel roll under forehead:  1)  Arms by side  I formation                                                                          2) T 8-10 reps  3x/week                                                                        3)  W                                                                         4)  M                                                                          5)  Y  (hardest so work up to it)       Skip a day in between strengthening exercises.         Ruben Im PT [facility] [address] [place], [place] [address] Phone # [phone] Fax [phone]

## 2016-12-09 NOTE — Therapy (Signed)
[facility] [address] [place], [place], [address] Phone: [phone]   Fax:  [phone]  Physical Therapy Treatment/Discharge Summary  Patient Details  Name: Sharon [name] MRN: [id number] Date of Birth: [birth year] Referring Provider: Dr. Durward Fortes  Encounter Date: 12/09/2016      PT End of Session - 12/09/16 1625    Visit Number 12   Number of Visits 20   Date for PT Re-Evaluation 01/01/17   Authorization Type Medicare; G codes   PT Start Time 8502   PT Stop Time 7741   PT Time Calculation (min) 45 min   Activity Tolerance Patient tolerated treatment well      Past Medical History:  Diagnosis Date  . Arthritis    Osteoarthritis  . Family history of colon cancer   . Family history of ovarian cancer   . Family history of stomach cancer   . Family history of uterine cancer   . Fibroid   . Headache(784.0)   . Hypertension   . Ringworm   . Vitamin D deficiency     Past Surgical History:  Procedure Laterality Date  . ABDOMINAL HYSTERECTOMY  1990   TAH BSO  . APPENDECTOMY  1969  . FOOT SURGERY     bunionectomy  . KNEE SURGERY  11/2013   arthroscopic L knee  . OOPHORECTOMY     BSO  . Precancerous lesion excised    . TONSILLECTOMY    . TOTAL KNEE ARTHROPLASTY  2012  . TOTAL KNEE ARTHROPLASTY Left 04/03/2015   Procedure: TOTAL KNEE ARTHROPLASTY;  Surgeon: Garald Balding, MD;  Location: [place];  Service: Orthopedics;  Laterality: Left;    There were no vitals filed for this visit.      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  Patient expresses readiness for discharge from PT to independent HEP.  Had some soreness from heavy household chores yesterday (moving furniture) yesterday but no pain today.     Currently in Pain? No/denies   Pain Score 0-No pain   Pain Location Shoulder   Pain Orientation Left   Pain Type Chronic pain            [facility]  PT Assessment - 12/09/16 0001      Observation/Other Assessments   Focus on Therapeutic Outcomes (FOTO)  28% limitation     AROM   Left Shoulder Flexion 165 Degrees   Left Shoulder ABduction 170 Degrees   Left Shoulder Internal Rotation --  T6   Left Shoulder External Rotation --  C7   Cervical Flexion 55   Cervical Extension 60   Cervical - Right Side Bend 40   Cervical - Left Side Bend 40   Cervical - Right Rotation 50   Cervical - Left Rotation 50     Strength   Left Shoulder Flexion 4+/5   Left Shoulder Extension 4+/5   Left Shoulder ABduction 4+/5   Left Shoulder Internal Rotation 4+/5   Left Shoulder External Rotation 4+/5   Left Shoulder Horizontal ABduction 5/5   Left Shoulder Horizontal ADduction 4+/5                     [facility] Adult PT Treatment/Exercise - 12/09/16 0001      Therapeutic Activites    ADL's reaching, pushing, pulling     Shoulder Exercises: Prone   Other Prone Exercises prone Is, Ts, Ws Ys 5x each   Other Prone  Exercises DIscussion of adding 1# weight and decreasing # of reps     Shoulder Exercises: Standing   Other Standing Exercises review of band HEP     Shoulder Exercises: ROM/Strengthening   UBE (Upper Arm Bike) 3x3  ball behind mid back   Other ROM/Strengthening Exercises UE Ranger on wall level 10, 15, 20, 25 (10 reps each)                PT Education - 12/09/16 1624    Education provided Yes   Education Details prone Is, Ys, Ts, Ws ;  strength progression   Person(s) Educated Patient   Methods Explanation;Demonstration;Handout   Comprehension Verbalized understanding;Returned demonstration          PT Short Term Goals - 12/09/16 1607      PT SHORT TERM GOAL #1   Title The patient will demonstrate postural awareness and alignment in sitting as well as basic self care strategies   11/06/16   Status Achieved     PT SHORT TERM GOAL #2   Title The patient will demonstrate improved left shoulder flexion  and abduction to 155 degrees needed for overhead reaching   Status Achieved     PT SHORT TERM GOAL #3   Title The patient will have a 30% improvement in pain with dressing and night time discomfort   Status Achieved           PT Long Term Goals - 12/09/16 1606      PT LONG TERM GOAL #1   Title The patient will be independent in safe, self progression of HEP and aquatic ex program for further strengthening, ROM and pain reduction   12/04/16   Status Achieved     PT LONG TERM GOAL #2   Title The patient will have 4+/5 scapula, shoulder and cervical muscle strength needed for maintaining erect posture singing in the choir and doing arts and crafts   Status Achieved     PT LONG TERM GOAL #3   Title Left shoulder flexion and abduction 160 degrees, external rotation to 85 degrees needed for dressing and reaching   Status Achieved     PT LONG TERM GOAL #4   Title Patient will report a 60% improvement in pain with usual ADLs   Status Achieved     PT LONG TERM GOAL #5   Title FOTO functional outcome score improved from 43% limitation to 33% indicating improved function with less pain   Status Achieved               Plan - 12/09/16 1625    Clinical Impression Statement The patient has made excellent progress in pain reduction,  ROM, strength and function.  Her shoulder and cervical ROM are full and painless.  Her glenohumeral and scapular strength is grossly 4+/5.  Her FOTO functional outcome score has improved from  48% limitation to 28%.  All STGs and LTGs met.  The patient reports she is overall 80% better.   Will discharge patient from PT at his time.        Patient will benefit from skilled therapeutic intervention in order to improve the following deficits and impairments:     Visit Diagnosis: Chronic left shoulder pain  Stiffness of left shoulder, not elsewhere classified  Muscle weakness (generalized)  Cramp and spasm    PHYSICAL THERAPY DISCHARGE  SUMMARY  Visits from Start of Care: 12  Current functional level related to goals / functional outcomes: See clinical impressions  above   Remaining deficits: As above   Education / Equipment: Comprehensive and progressive HEP Plan: Patient agrees to discharge.  Patient goals were met. Patient is being discharged due to meeting the stated rehab goals.  ?????         Problem List Patient Active Problem List   Diagnosis Date Noted  . Genetic testing 11/14/2016  . Family history of ovarian cancer   . Family history of colon cancer   . Family history of uterine cancer   . Family history of stomach cancer   . Osteoarthritis of left knee 04/03/2015  . S/P total knee replacement using cement 04/03/2015  . Gait abnormality 12/22/2012  . Swelling of ankle 12/22/2012  . Ringworm   . Hypertension   . Fibroid   . Headache(784.0)   . Arthritis   . Vitamin D deficiency    Ruben Im, PT 12/09/16 5:12 PM Phone: [phone] Fax: [phone]  Alvera Singh 12/09/2016, 5:10 PM  [facility] [address] [place], [place], [address] Phone: [phone]   Fax:  [phone]  Name: Sharon [name] MRN: [id number] Date of Birth: [birth year]

## 2016-12-11 ENCOUNTER — Encounter: Payer: PPO | Admitting: Physical Therapy

## 2016-12-30 DIAGNOSIS — L821 Other seborrheic keratosis: Secondary | ICD-10-CM | POA: Diagnosis not present

## 2016-12-30 DIAGNOSIS — B0089 Other herpesviral infection: Secondary | ICD-10-CM | POA: Diagnosis not present

## 2017-02-25 DIAGNOSIS — I10 Essential (primary) hypertension: Secondary | ICD-10-CM | POA: Diagnosis not present

## 2017-06-18 ENCOUNTER — Ambulatory Visit (INDEPENDENT_AMBULATORY_CARE_PROVIDER_SITE_OTHER): Payer: PPO | Admitting: Orthopaedic Surgery

## 2017-06-19 ENCOUNTER — Encounter (INDEPENDENT_AMBULATORY_CARE_PROVIDER_SITE_OTHER): Payer: Self-pay | Admitting: Orthopaedic Surgery

## 2017-06-19 ENCOUNTER — Ambulatory Visit (INDEPENDENT_AMBULATORY_CARE_PROVIDER_SITE_OTHER): Payer: PPO

## 2017-06-19 ENCOUNTER — Ambulatory Visit (INDEPENDENT_AMBULATORY_CARE_PROVIDER_SITE_OTHER): Payer: PPO | Admitting: Orthopaedic Surgery

## 2017-06-19 VITALS — BP 127/86 | HR 74 | Resp 14 | Ht 67.0 in | Wt 165.0 lb

## 2017-06-19 DIAGNOSIS — M25552 Pain in left hip: Secondary | ICD-10-CM

## 2017-06-19 NOTE — Progress Notes (Signed)
Office Visit Note   Patient: Sharon [name]           Date of Birth: April 02, 1951           MRN: [id number] Visit Date: 06/19/2017              Requested by: Levin Erp, MD [address], [address], [place] [address] PCP: Levin Erp, MD   Assessment & Plan: Visit Diagnoses:  1. Pain of left hip joint   Primary osteoarthritis  Plan: Progressive osteoarthritis left hip. We'll consult  Dr. Ernestina Patches to inject cortisone left hip. Long discussion regarding the diagnosis and different treatment options over time including hip replacement  Follow-Up Instructions: No Follow-up on file.   Orders:  Orders Placed This Encounter  Procedures  . XR Pelvis 1-2 Views   No orders of the defined types were placed in this encounter.     Procedures: No procedures performed   Clinical Data: No additional findings.   Subjective: Chief Complaint  Patient presents with  . Left Knee - Pain, Edema    Left knee pain lateral with edema.  . Left Hip - Pain    [name] is a 66 y o that presents with Left knee pain, hx of L TKA in 2016. She relates she also has groin and buttock pain, minimal back pain.  Pat's had bilateral total knee replacements and that standpoint doing well. She was diagnosed with early osteoarthritis in her left hip in December and notes that she's been having a little bit more discomfort in her left groin with referred pain along the thigh and even into her knee. Her knee is not hot red or swollen. No injury or trauma. Not had any numbness or tingling or significant problem with her back. Denies fever or chills shortness of breath or chest pain  HPI  Review of Systems  Constitutional: Negative for chills, fatigue and fever.  Eyes: Negative for itching.  Respiratory: Negative for chest tightness and shortness of breath.   Cardiovascular: Positive for leg swelling. Negative for chest pain and palpitations.  Gastrointestinal: Negative for blood in stool,  constipation and diarrhea.  Musculoskeletal: Positive for back pain, joint swelling and neck stiffness. Negative for neck pain.  Neurological: Negative for dizziness, weakness, numbness and headaches.  Hematological: Does not bruise/bleed easily.  Psychiatric/Behavioral: Positive for sleep disturbance. The patient is not nervous/anxious.      Objective: Vital Signs: BP 127/86   Pulse 74   Resp 14   Ht 5\' 7"  (1.702 m)   Wt 165 lb (74.8 kg)   LMP 02/17/1989   BMI 25.84 kg/m   Physical Exam  Ortho Exam both total knee replacements look completely benign. Neither knee was hot warm or swollen. Incisions of healed nicely no effusion. No instability. Full extension and symmetrical flexion. No calf pain. No popliteal discomfort. No swelling distally. Does walk with a little bit of a limp favoring her left hip. Some decrease in the internal/external rotation of her left hip compared to right. Straight leg raise negative. No pain to percussion about the lumbar spine of the sacroiliac joints.  Specialty Comments:  No specialty comments available.  Imaging: Xr Pelvis 1-2 Views  Result Date: 06/19/2017 AP the pelvis and lateral left hip were obtained. These are compared to films that were performed in December. There is progressive yet slight increased narrowing of the left hip joint space associated with subchondral sclerosis and subchondral cysts particularly on the femoral head.  On is consistent with progressive osteoarthritis    PMFS History: Patient Active Problem List   Diagnosis Date Noted  . Genetic testing 11/14/2016  . Family history of ovarian cancer   . Family history of colon cancer   . Family history of uterine cancer   . Family history of stomach cancer   . Osteoarthritis of left knee 04/03/2015  . S/P total knee replacement using cement 04/03/2015  . Gait abnormality 12/22/2012  . Swelling of ankle 12/22/2012  . Ringworm   . Hypertension   . Fibroid   .  Headache(784.0)   . Arthritis   . Vitamin D deficiency    Past Medical History:  Diagnosis Date  . Arthritis    Osteoarthritis  . Family history of colon cancer   . Family history of ovarian cancer   . Family history of stomach cancer   . Family history of uterine cancer   . Fibroid   . Headache(784.0)   . Hypertension   . Ringworm   . Vitamin D deficiency     Family History  Problem Relation Age of Onset  . Ovarian cancer Mother 24  . Heart disease Maternal Grandfather   . Hypertension Paternal Grandmother   . Ovarian cancer Cousin        maternal first cousin dx in her 28s  . Heart disease Father   . Atrial fibrillation Father   . Heart disease Brother   . Heart disease Brother   . Colon cancer Maternal Aunt        dx in her 45s-60  . Diabetes Paternal Aunt   . Ovarian cancer Maternal Grandmother        dx in her mid 46s  . Cancer Maternal Grandmother        OVARIAN AND UTERINE  . Uterine cancer Maternal Grandmother        dx in her early 77s  . Stomach cancer Maternal Uncle        dx in his 81s-80s  . Stroke Maternal Uncle   . Leukemia Paternal Uncle     Past Surgical History:  Procedure Laterality Date  . ABDOMINAL HYSTERECTOMY  1990   TAH BSO  . APPENDECTOMY  1969  . FOOT SURGERY     bunionectomy  . KNEE SURGERY  11/2013   arthroscopic L knee  . OOPHORECTOMY     BSO  . Precancerous lesion excised    . TONSILLECTOMY    . TOTAL KNEE ARTHROPLASTY  2012  . TOTAL KNEE ARTHROPLASTY Left 04/03/2015   Procedure: TOTAL KNEE ARTHROPLASTY;  Surgeon: Garald Balding, MD;  Location: [facility];  Service: Orthopedics;  Laterality: Left;   Social History   Occupational History  . Not on file.   Social History Main Topics  . Smoking status: Never Smoker  . Smokeless tobacco: Never Used  . Alcohol use 1.5 oz/week    3 Standard drinks or equivalent per week  . Drug use: No  . Sexual activity: Yes    Partners: Male    Birth control/ protection: Surgical,  Post-menopausal

## 2017-06-29 ENCOUNTER — Ambulatory Visit (INDEPENDENT_AMBULATORY_CARE_PROVIDER_SITE_OTHER): Payer: PPO

## 2017-06-29 ENCOUNTER — Encounter (INDEPENDENT_AMBULATORY_CARE_PROVIDER_SITE_OTHER): Payer: Self-pay | Admitting: Physical Medicine and Rehabilitation

## 2017-06-29 ENCOUNTER — Ambulatory Visit (INDEPENDENT_AMBULATORY_CARE_PROVIDER_SITE_OTHER): Payer: PPO | Admitting: Physical Medicine and Rehabilitation

## 2017-06-29 DIAGNOSIS — M25552 Pain in left hip: Secondary | ICD-10-CM | POA: Diagnosis not present

## 2017-06-29 NOTE — Patient Instructions (Signed)

## 2017-06-29 NOTE — Progress Notes (Signed)
Sharon [name] - 66 y.o. female MRN [id number]  Date of birth: June 20, 1951  Office Visit Note: Visit Date: 06/29/2017 PCP: Levin Erp, MD Referred by: Levin Erp, MD  Subjective: Chief Complaint  Patient presents with  . Left Hip - Pain   HPI: Sharon [name] is a 66 year old female with chronic worsening history of left groin pain which wraps around towards the back of the hip but also down towards the left thigh and knee. She has a history of total knee arthroplasty on the left. She is a lot of pain with walking. She is followed by Dr. Durward Fortes who request diagnostic and therapeutic anesthetic hip arthrogram.    ROS Otherwise per HPI.  Assessment & Plan: Visit Diagnoses:  1. Pain in left hip     Plan: Findings:  Diagnostic and therapeutic anesthetic hip arthrogram. Patient did have relief during the anesthetic phase of the injection to a degree.    Meds & Orders: No orders of the defined types were placed in this encounter.   Orders Placed This Encounter  Procedures  . Large Joint Injection/Arthrocentesis  . XR C-ARM NO REPORT    Follow-up: Return if symptoms worsen or fail to improve, for Dr. Durward Fortes.   Procedures: Diagnostic of therapeutic anesthetic hip arthrogram with fluoroscopic guidance Date/Time: 06/29/2017 1:20 PM Performed by: Magnus Sinning Authorized by: Magnus Sinning   Consent Given by:  Patient Site marked: the procedure site was marked   Timeout: prior to procedure the correct patient, procedure, and site was verified   Indications:  Pain and diagnostic evaluation Location:  Hip Site:  L hip joint Prep: patient was prepped and draped in usual sterile fashion   Needle Size:  22 G Approach:  Anterior Ultrasound Guidance: No   Fluoroscopic Guidance: No   Arthrogram: Yes   Medications:  80 mg triamcinolone acetonide 40 MG/ML; 3 mL bupivacaine 0.5 % Aspiration Attempted: Yes   Patient tolerance:  Patient tolerated the procedure well with no  immediate complications  Arthrogram demonstrated excellent flow of contrast throughout the joint surface without extravasation or obvious defect.  The patient had relief of symptoms during the anesthetic phase of the injection.     No notes on file   Clinical History: Plain film x-ray of the pelvis 06/19/2017  AP the pelvis and lateral left hip were obtained. These are compared to  films that were performed in December. There is progressive yet slight  increased narrowing of the left hip joint space associated with  subchondral sclerosis and subchondral cysts particularly on the femoral  head. On is consistent with progressive osteoarthritis  She reports that she has never smoked. She has never used smokeless tobacco. No results for input(s): HGBA1C, LABURIC in the last 8760 hours.  Objective:  VS:  HT:    WT:   BMI:     BP:   HR: bpm  TEMP: ( )  RESP:  Physical Exam  Musculoskeletal:  Patient did have some pain at end ranges of hip rotation.    Ortho Exam Imaging: Xr C-arm No Report  Result Date: 06/29/2017 Please see Notes or Procedures tab for imaging impression.   Past Medical/Family/Surgical/Social History: Medications & Allergies reviewed per EMR Patient Active Problem List   Diagnosis Date Noted  . Genetic testing 11/14/2016  . Family history of ovarian cancer   . Family history of colon cancer   . Family history of uterine cancer   . Family history of stomach cancer   . Osteoarthritis  of left knee 04/03/2015  . S/P total knee replacement using cement 04/03/2015  . Gait abnormality 12/22/2012  . Swelling of ankle 12/22/2012  . Ringworm   . Hypertension   . Fibroid   . Headache(784.0)   . Arthritis   . Vitamin D deficiency    Past Medical History:  Diagnosis Date  . Arthritis    Osteoarthritis  . Family history of colon cancer   . Family history of ovarian cancer   . Family history of stomach cancer   . Family history of uterine cancer   . Fibroid    . Headache(784.0)   . Hypertension   . Ringworm   . Vitamin D deficiency    Family History  Problem Relation Age of Onset  . Ovarian cancer Mother 41  . Heart disease Maternal Grandfather   . Hypertension Paternal Grandmother   . Ovarian cancer Cousin        maternal first cousin dx in her 55s  . Heart disease Father   . Atrial fibrillation Father   . Heart disease Brother   . Heart disease Brother   . Colon cancer Maternal Aunt        dx in her 55s-60  . Diabetes Paternal Aunt   . Ovarian cancer Maternal Grandmother        dx in her mid 104s  . Cancer Maternal Grandmother        OVARIAN AND UTERINE  . Uterine cancer Maternal Grandmother        dx in her early 33s  . Stomach cancer Maternal Uncle        dx in his 41s-80s  . Stroke Maternal Uncle   . Leukemia Paternal Uncle    Past Surgical History:  Procedure Laterality Date  . ABDOMINAL HYSTERECTOMY  1990   TAH BSO  . APPENDECTOMY  1969  . FOOT SURGERY     bunionectomy  . KNEE SURGERY  11/2013   arthroscopic L knee  . OOPHORECTOMY     BSO  . Precancerous lesion excised    . TONSILLECTOMY    . TOTAL KNEE ARTHROPLASTY  2012  . TOTAL KNEE ARTHROPLASTY Left 04/03/2015   Procedure: TOTAL KNEE ARTHROPLASTY;  Surgeon: Garald Balding, MD;  Location: [facility];  Service: Orthopedics;  Laterality: Left;   Social History   Occupational History  . Not on file.   Social History Main Topics  . Smoking status: Never Smoker  . Smokeless tobacco: Never Used  . Alcohol use 1.5 oz/week    3 Standard drinks or equivalent per week  . Drug use: No  . Sexual activity: Yes    Partners: Male    Birth control/ protection: Surgical, Post-menopausal

## 2017-06-29 NOTE — Progress Notes (Deleted)
Left groin pain which wraps around towards back of hip. Also has left knee pain with history of TKA. Hurts more when walking.

## 2017-06-30 MED ORDER — TRIAMCINOLONE ACETONIDE 40 MG/ML IJ SUSP
80.0000 mg | INTRAMUSCULAR | Status: AC | PRN
  Administered 2017-06-29: 80 mg via INTRA_ARTICULAR

## 2017-06-30 MED ORDER — BUPIVACAINE HCL 0.5 % IJ SOLN
3.0000 mL | INTRAMUSCULAR | Status: AC | PRN
  Administered 2017-06-29: 3 mL via INTRA_ARTICULAR

## 2017-07-01 DIAGNOSIS — Z23 Encounter for immunization: Secondary | ICD-10-CM | POA: Diagnosis not present

## 2017-07-01 DIAGNOSIS — M1990 Unspecified osteoarthritis, unspecified site: Secondary | ICD-10-CM | POA: Diagnosis not present

## 2017-07-01 DIAGNOSIS — I10 Essential (primary) hypertension: Secondary | ICD-10-CM | POA: Diagnosis not present

## 2017-07-07 DIAGNOSIS — D1801 Hemangioma of skin and subcutaneous tissue: Secondary | ICD-10-CM | POA: Diagnosis not present

## 2017-07-07 DIAGNOSIS — L738 Other specified follicular disorders: Secondary | ICD-10-CM | POA: Diagnosis not present

## 2017-07-07 DIAGNOSIS — B0089 Other herpesviral infection: Secondary | ICD-10-CM | POA: Diagnosis not present

## 2017-07-07 DIAGNOSIS — L821 Other seborrheic keratosis: Secondary | ICD-10-CM | POA: Diagnosis not present

## 2017-07-29 DIAGNOSIS — J40 Bronchitis, not specified as acute or chronic: Secondary | ICD-10-CM | POA: Diagnosis not present

## 2017-07-29 DIAGNOSIS — I10 Essential (primary) hypertension: Secondary | ICD-10-CM | POA: Diagnosis not present

## 2017-09-02 DIAGNOSIS — Z1231 Encounter for screening mammogram for malignant neoplasm of breast: Secondary | ICD-10-CM | POA: Diagnosis not present

## 2017-09-23 ENCOUNTER — Ambulatory Visit (INDEPENDENT_AMBULATORY_CARE_PROVIDER_SITE_OTHER): Payer: PPO | Admitting: Obstetrics and Gynecology

## 2017-09-23 ENCOUNTER — Encounter: Payer: Self-pay | Admitting: Obstetrics and Gynecology

## 2017-09-23 ENCOUNTER — Other Ambulatory Visit: Payer: Self-pay

## 2017-09-23 VITALS — BP 130/80 | HR 84 | Resp 16 | Ht 66.25 in | Wt 178.0 lb

## 2017-09-23 DIAGNOSIS — Z800 Family history of malignant neoplasm of digestive organs: Secondary | ICD-10-CM

## 2017-09-23 DIAGNOSIS — Z8041 Family history of malignant neoplasm of ovary: Secondary | ICD-10-CM

## 2017-09-23 DIAGNOSIS — Z01419 Encounter for gynecological examination (general) (routine) without abnormal findings: Secondary | ICD-10-CM | POA: Diagnosis not present

## 2017-09-23 MED ORDER — VENLAFAXINE HCL ER 37.5 MG PO CP24: 37.5000 mg | ORAL_CAPSULE | Freq: Every day | ORAL | 3 refills | Status: DC

## 2017-09-23 NOTE — Progress Notes (Signed)
66 y.o. [id number] MarriedCaucasianF here for annual exam.  H/O TAH/BSO. Family history of ovarian cancer, she has had negative BRCA testing an negative testing for Lynch. She had one aunt with colon cancer.  No vaginally bleeding, not sexually active (husband ED). She has been trying off the Effexor, she is taking it every other day and is feeling much better. She is only having occasional vasomotor symptoms, mood is better. Wants to continue this dose.  She has occasional rectal pain, can wake her up at night sometimes,, hasn't happened in a while. Now she just feels aware of her rectum, slight discomfort. She does have hemorrhoids as well. No rectal bleeding. Colonoscopy is UTD.  She has been having some sciatica.  She sometimes has hesitancy to void, stream seems normal mostly. Feels like she empties her bladder.  She can have mild GSI with a full bladder, tolerable.     Patient's last menstrual period was 02/17/1989.          Sexually active: No.  The current method of family planning is status post hysterectomy.    Exercising: Yes.    water yoga/ walking Smoker:  no  Health Maintenance: Pap:  2013 WNL  History of abnormal Pap:  no MMG:  11/ 2018 WNL per patient [facility]  Colonoscopy:  07-21-14 WNL  BMD:   09-22-16 WNL  TDaP:  Unsure  Gardasil: N/A   reports that  has never smoked. she has never used smokeless tobacco. She reports that she drinks about 1.5 oz of alcohol per week. She reports that she does not use drugs. Retired Pharmacist, hospital. She has 2 sons, not local, no grandchildren.   Past Medical History:  Diagnosis Date  . Arthritis    Osteoarthritis  . Family history of colon cancer   . Family history of ovarian cancer   . Family history of stomach cancer   . Family history of uterine cancer   . Fibroid   . Headache(784.0)   . Hypertension   . Ringworm   . Vitamin D deficiency     Past Surgical History:  Procedure Laterality Date  . ABDOMINAL HYSTERECTOMY  1990   TAH BSO  .  APPENDECTOMY  1969  . FOOT SURGERY     bunionectomy  . KNEE SURGERY  11/2013   arthroscopic L knee  . OOPHORECTOMY     BSO  . Precancerous lesion excised    . TONSILLECTOMY    . TOTAL KNEE ARTHROPLASTY  2012  . TOTAL KNEE ARTHROPLASTY Left 04/03/2015   Procedure: TOTAL KNEE ARTHROPLASTY;  Surgeon: Garald Balding, MD;  Location: [facility];  Service: Orthopedics;  Laterality: Left;    Current Outpatient Medications  Medication Sig Dispense Refill  . amoxicillin (AMOXIL) 500 MG capsule Take 2,000 mg by mouth once. 4-500 mg capsules prior to dental procedures.    Marland Kitchen aspirin-acetaminophen-caffeine (EXCEDRIN MIGRAINE) 250-250-65 MG per tablet Take 1 tablet by mouth every 6 (six) hours as needed for headache.    . cycloSPORINE (RESTASIS) 0.05 % ophthalmic emulsion 1 drop 2 (two) times daily.    . fluticasone (FLONASE) 50 MCG/ACT nasal spray Place 1 spray into both nostrils daily.    . hydrochlorothiazide (HYDRODIURIL) 25 MG tablet Take 25 mg by mouth daily.    . SUMAtriptan Succinate (IMITREX PO) Take by mouth as needed.     . venlafaxine XR (EFFEXOR-XR) 37.5 MG 24 hr capsule Take 1 capsule (37.5 mg total) by mouth daily. 90 capsule 3  . verapamil (COVERA HS)  240 MG (CO) 24 hr tablet Take 240 mg by mouth 2 (two) times daily.     . Vitamin D, Ergocalciferol, (DRISDOL) 50000 units CAPS capsule Take by mouth.     No current facility-administered medications for this visit.     Family History  Problem Relation Age of Onset  . Ovarian cancer Mother 62  . Heart disease Maternal Grandfather   . Hypertension Paternal Grandmother   . Ovarian cancer Cousin        maternal first cousin dx in her 11s  . Heart disease Father   . Atrial fibrillation Father   . Heart disease Brother   . Heart disease Brother   . Colon cancer Maternal Aunt        dx in her 44s-60  . Diabetes Paternal Aunt   . Ovarian cancer Maternal Grandmother        dx in her mid 29s  . Cancer Maternal Grandmother        OVARIAN  AND UTERINE  . Uterine cancer Maternal Grandmother        dx in her early 44s  . Stomach cancer Maternal Uncle        dx in his 20s-80s  . Stroke Maternal Uncle   . Leukemia Paternal Uncle     Review of Systems  Constitutional: Negative.   HENT: Negative.   Eyes: Negative.   Respiratory: Negative.   Cardiovascular: Negative.   Gastrointestinal: Positive for rectal pain.  Endocrine: Negative.   Genitourinary: Negative.   Musculoskeletal: Negative.   Skin: Negative.   Allergic/Immunologic: Negative.   Neurological: Negative.   Psychiatric/Behavioral: Negative.     Exam:   BP 130/80 (BP Location: Right Arm, Patient Position: Sitting, Cuff Size: Normal)   Pulse 84   Resp 16   Ht 5' 6.25" (1.683 m)   Wt 178 lb (80.7 kg)   LMP 02/17/1989   BMI 28.51 kg/m   Weight change: '@WEIGHTCHANGE' @ Height:   Height: 5' 6.25" (168.3 cm)  Ht Readings from Last 3 Encounters:  09/23/17 5' 6.25" (1.683 m)  06/19/17 '5\' 7"'  (1.702 m)  10/07/16 '5\' 7"'  (1.702 m)    General appearance: alert, cooperative and appears stated age Head: Normocephalic, without obvious abnormality, atraumatic Neck: no adenopathy, supple, symmetrical, trachea midline and thyroid normal to inspection and palpation Lungs: clear to auscultation bilaterally Cardiovascular: regular rate and rhythm Breasts: normal appearance, no masses or tenderness Abdomen: soft, non-tender; non distended,  no masses,  no organomegaly Extremities: extremities normal, atraumatic, no cyanosis or edema Skin: Skin color, texture, turgor normal. No rashes or lesions Lymph nodes: Cervical, supraclavicular, and axillary nodes normal. No abnormal inguinal nodes palpated Neurologic: Grossly normal   Pelvic: External genitalia:  no lesions              Urethra:  normal appearing urethra with no masses, tenderness or lesions              Bartholins and Skenes: normal                 Vagina: normal appearing vagina with normal color and discharge,  no lesions              Cervix: absent               Bimanual Exam:  Uterus:  uterus absent              Adnexa: no mass, fullness, tenderness  Rectovaginal: Confirms               Anus:  normal sphincter tone, no lesions  Chaperone was present for exam.  A:  Well Woman with normal exam  Family history of ovarian and colon cancer  On Effexor, doing well    P:   No pap is needs  Labs and immunizations with primary  Normal DEXA last year  Mammogram and colonoscopy are up to date  She will f/u with her primary for rectal discomfort  Discussed breast self exam  Discussed calcium and vit D intake

## 2017-09-23 NOTE — Patient Instructions (Signed)

## 2017-10-15 DIAGNOSIS — H35362 Drusen (degenerative) of macula, left eye: Secondary | ICD-10-CM | POA: Diagnosis not present

## 2017-10-15 DIAGNOSIS — H43813 Vitreous degeneration, bilateral: Secondary | ICD-10-CM | POA: Diagnosis not present

## 2017-10-15 DIAGNOSIS — H16223 Keratoconjunctivitis sicca, not specified as Sjogren's, bilateral: Secondary | ICD-10-CM | POA: Diagnosis not present

## 2017-10-15 DIAGNOSIS — H35361 Drusen (degenerative) of macula, right eye: Secondary | ICD-10-CM | POA: Diagnosis not present

## 2017-10-15 DIAGNOSIS — H2513 Age-related nuclear cataract, bilateral: Secondary | ICD-10-CM | POA: Diagnosis not present

## 2017-10-22 ENCOUNTER — Ambulatory Visit (INDEPENDENT_AMBULATORY_CARE_PROVIDER_SITE_OTHER): Payer: PPO | Admitting: Orthopaedic Surgery

## 2017-10-22 ENCOUNTER — Encounter (INDEPENDENT_AMBULATORY_CARE_PROVIDER_SITE_OTHER): Payer: Self-pay | Admitting: Orthopaedic Surgery

## 2017-10-22 VITALS — BP 114/79 | HR 84 | Ht 67.0 in | Wt 178.0 lb

## 2017-10-22 DIAGNOSIS — M545 Low back pain, unspecified: Secondary | ICD-10-CM

## 2017-10-22 DIAGNOSIS — M25552 Pain in left hip: Secondary | ICD-10-CM | POA: Diagnosis not present

## 2017-10-22 NOTE — Progress Notes (Signed)
Office Visit Note   Patient: Sharon [name]           Date of Birth: May 26, 1951           MRN: [id number] Visit Date: 10/22/2017              Requested by: Levin Erp, MD [place], [address], [place] [address] PCP: Levin Erp, MD   Assessment & Plan: Visit Diagnoses:  1. Lumbar pain   2. Pain of left hip joint     Plan: Pain along lumbar spine left buttock referred discomfort to the lower left leg. Prior history of degenerative changes lumbar spine by plain films year ago. Also has degenerative arthritis of left hip. Has had a prior cortisone injection left hip about 3 months ago that provided some temporary relief. Present pain appears to be related to her back. We'll order an MRI scan of lumbar spine given the chronicity of her problem and the need to determine whether or not there is pathology in both the back and her left hip  Follow-Up Instructions: Return after MRI L=S spine.   Orders:  Orders Placed This Encounter  Procedures  . MR Lumbar Spine w/o contrast   No orders of the defined types were placed in this encounter.     Procedures: No procedures performed   Clinical Data: No additional findings.   Subjective: Chief Complaint  Patient presents with  . Left Hip - Pain, Edema    Sharon [name] is a 67 y o here today with recurrent Left hip pain x 1 month. She had an injection from Dr. Ernestina Patches 06/29/17. She's not sure hip or lumbar is causing the pain. Pain in her groin  [name] has history of lumbar spine pain. Had films of the lumbar spine December 2017 reveals some mild arthritis the lower levels. Recently has been diagnosed with degenerative arthritis of her left hip. She had a cortisone injection by Dr. Ernestina Patches in September with good relief but for short period of time. Presently he is having pain in the area of her left buttock left thigh as far distally as her left foot and ankle. No injury or trauma. Has had some mild groin pain and knee  discomfort that probably is related to her hip.  HPI  Review of Systems  Constitutional: Negative for chills, fatigue and fever.  Eyes: Negative for itching.  Respiratory: Negative for chest tightness and shortness of breath.   Cardiovascular: Negative for chest pain, palpitations and leg swelling.  Gastrointestinal: Negative for blood in stool, constipation and diarrhea.  Endocrine: Negative for polyuria.  Genitourinary: Negative for dysuria.  Musculoskeletal: Positive for back pain and neck pain. Negative for joint swelling and neck stiffness.  Allergic/Immunologic: Negative for immunocompromised state.  Neurological: Negative for dizziness and numbness.  Hematological: Does not bruise/bleed easily.  Psychiatric/Behavioral: Positive for sleep disturbance. The patient is not nervous/anxious.      Objective: Vital Signs: BP 114/79   Pulse 84   Ht 5\' 7"  (1.702 m)   Wt 178 lb (80.7 kg)   LMP 02/17/1989   BMI 27.88 kg/m   Physical Exam  Ortho Exam awake alert and oriented 3. Comfortable sitting. Some pain in the extreme of internal/external rotation of the left hip. Range of motion left hip decreased from the right. Straight leg raise negative. Neurovascular exam intact. No pain about the left knee with range of motion. Prior left total knee replacement. No percussible tenderness of the lumbar spine.  Does not walk with a limp.  Specialty Comments:  No specialty comments available.  Imaging: No results found.   PMFS History: Patient Active Problem List   Diagnosis Date Noted  . Genetic testing 11/14/2016  . Family history of ovarian cancer   . Family history of colon cancer   . Family history of uterine cancer   . Family history of stomach cancer   . Osteoarthritis of left knee 04/03/2015  . S/P total knee replacement using cement 04/03/2015  . Gait abnormality 12/22/2012  . Swelling of ankle 12/22/2012  . Ringworm   . Hypertension   . Fibroid   . Headache(784.0)     . Arthritis   . Vitamin D deficiency    Past Medical History:  Diagnosis Date  . Arthritis    Osteoarthritis  . Family history of colon cancer   . Family history of ovarian cancer   . Family history of stomach cancer   . Family history of uterine cancer   . Fibroid   . Headache(784.0)   . Hypertension   . Ringworm   . Vitamin D deficiency     Family History  Problem Relation Age of Onset  . Ovarian cancer Mother 90  . Heart disease Maternal Grandfather   . Hypertension Paternal Grandmother   . Ovarian cancer Cousin        maternal first cousin dx in her 49s  . Heart disease Father   . Atrial fibrillation Father   . Heart disease Brother   . Heart disease Brother   . Colon cancer Maternal Aunt        dx in her 102s-60  . Diabetes Paternal Aunt   . Ovarian cancer Maternal Grandmother        dx in her mid 60s  . Cancer Maternal Grandmother        OVARIAN AND UTERINE  . Uterine cancer Maternal Grandmother        dx in her early 78s  . Stomach cancer Maternal Uncle        dx in his 18s-80s  . Stroke Maternal Uncle   . Leukemia Paternal Uncle     Past Surgical History:  Procedure Laterality Date  . ABDOMINAL HYSTERECTOMY  1990   TAH BSO  . APPENDECTOMY  1969  . FOOT SURGERY     bunionectomy  . KNEE SURGERY  11/2013   arthroscopic L knee  . OOPHORECTOMY     BSO  . Precancerous lesion excised    . TONSILLECTOMY    . TOTAL KNEE ARTHROPLASTY  2012  . TOTAL KNEE ARTHROPLASTY Left 04/03/2015   Procedure: TOTAL KNEE ARTHROPLASTY;  Surgeon: Garald Balding, MD;  Location: [facility];  Service: Orthopedics;  Laterality: Left;   Social History   Occupational History  . Not on file  Tobacco Use  . Smoking status: Never Smoker  . Smokeless tobacco: Never Used  Substance and Sexual Activity  . Alcohol use: Yes    Alcohol/week: 1.5 oz    Types: 3 Standard drinks or equivalent per week  . Drug use: No  . Sexual activity: Not Currently    Partners: Male    Birth  control/protection: Surgical, Post-menopausal

## 2017-10-27 DIAGNOSIS — E7800 Pure hypercholesterolemia, unspecified: Secondary | ICD-10-CM | POA: Diagnosis not present

## 2017-10-27 DIAGNOSIS — M1610 Unilateral primary osteoarthritis, unspecified hip: Secondary | ICD-10-CM | POA: Diagnosis not present

## 2017-10-27 DIAGNOSIS — I10 Essential (primary) hypertension: Secondary | ICD-10-CM | POA: Diagnosis not present

## 2017-10-27 DIAGNOSIS — Z6841 Body Mass Index (BMI) 40.0 and over, adult: Secondary | ICD-10-CM | POA: Diagnosis not present

## 2017-11-04 ENCOUNTER — Ambulatory Visit
Admission: RE | Admit: 2017-11-04 | Discharge: 2017-11-04 | Disposition: A | Discharge status: Home / Self Care | Payer: PPO | Source: Ambulatory Visit | Attending: Orthopaedic Surgery | Admitting: Orthopaedic Surgery

## 2017-11-04 DIAGNOSIS — M545 Low back pain, unspecified: Secondary | ICD-10-CM

## 2017-11-04 DIAGNOSIS — M48061 Spinal stenosis, lumbar region without neurogenic claudication: Secondary | ICD-10-CM | POA: Diagnosis not present

## 2017-11-09 ENCOUNTER — Ambulatory Visit (INDEPENDENT_AMBULATORY_CARE_PROVIDER_SITE_OTHER): Payer: PPO | Admitting: Orthopaedic Surgery

## 2017-11-12 ENCOUNTER — Telehealth (INDEPENDENT_AMBULATORY_CARE_PROVIDER_SITE_OTHER): Payer: Self-pay | Admitting: Physical Medicine and Rehabilitation

## 2017-11-12 NOTE — Telephone Encounter (Signed)
Scheduled for 11/24/17 at 1345.

## 2017-11-12 NOTE — Telephone Encounter (Signed)
ok 

## 2017-11-24 ENCOUNTER — Encounter (INDEPENDENT_AMBULATORY_CARE_PROVIDER_SITE_OTHER): Payer: Self-pay | Admitting: Physical Medicine and Rehabilitation

## 2017-11-24 ENCOUNTER — Ambulatory Visit (INDEPENDENT_AMBULATORY_CARE_PROVIDER_SITE_OTHER): Payer: PPO | Admitting: Physical Medicine and Rehabilitation

## 2017-11-24 ENCOUNTER — Ambulatory Visit (INDEPENDENT_AMBULATORY_CARE_PROVIDER_SITE_OTHER): Payer: PPO

## 2017-11-24 DIAGNOSIS — M25552 Pain in left hip: Secondary | ICD-10-CM

## 2017-11-24 NOTE — Progress Notes (Deleted)
Pt states dull aching pain in lower back that radiates through the left hip, left groin, left thigh, and left knee. Pt states pain has been going on since Christmas 2018. Pt states last injection helped made her movements comfortable. Pt states when she does water yoga and laying in bed at night makes pain worse, advil makes pain better. -Dye allergies.

## 2017-11-24 NOTE — Progress Notes (Signed)
Sharon [name] - 67 y.o. female MRN [id number]  Date of birth: 1951/05/28  Office Visit Note: Visit Date: 11/24/2017 PCP: Sharon Erp, MD Referred by: Sharon Erp, MD  Subjective: Chief Complaint  Patient presents with  . Lower Back - Pain  . Left Hip - Pain  . Left Knee - Pain   HPI: Sharon [name] is a 67 year old female who I last saw #2018 and completed diagnostic therapeutic anesthetic hip arthrogram with good relief of symptoms up until just recently.  She reports now a dull aching pain in the lower back that refers to the left hip and groin and thigh down to the knee.  She reports really this started around Christmas time and is just continually gotten worse since that time.  She reports that the last injection made her movements a lot more comfortable and she was pretty happy.  She continues to stay active with water yoga and other activities.  She reports a lot of pain at night when laying in bed.  She does use anti-inflammatories with mild relief.    ROS Otherwise per HPI.  Assessment & Plan: Visit Diagnoses:  1. Pain in left hip     Plan: Findings:  Intra-articular hip injection with fluoroscopic guidance.  Patient did have relief during the anesthetic phase of the injection.    Meds & Orders: No orders of the defined types were placed in this encounter.   Orders Placed This Encounter  Procedures  . Large Joint Inj: L hip joint  . XR C-ARM NO REPORT    Follow-up: Return if symptoms worsen or fail to improve, for Dr. Freddy [name].   Procedures: Large Joint Inj: L hip joint on 11/24/2017 1:49 PM Indications: diagnostic evaluation and pain Details: 22 G 3.5 in needle, fluoroscopy-guided anterior approach  Arthrogram: No  Medications: 80 mg triamcinolone acetonide 40 MG/ML; 3 mL bupivacaine 0.5 % Outcome: tolerated well, no immediate complications  There was excellent flow of contrast producing a partial arthrogram of the hip. The patient did have relief of  symptoms during the anesthetic phase of the injection. Procedure, treatment alternatives, risks and benefits explained, specific risks discussed. Consent was given by the patient. Immediately prior to procedure a time out was called to verify the correct patient, procedure, equipment, support staff and site/side marked as required. Patient was prepped and draped in the usual sterile fashion.      No notes on file   Clinical History: MRI LUMBAR SPINE WITHOUT CONTRAST  TECHNIQUE: Multiplanar, multisequence MR imaging of the lumbar spine was performed. No intravenous contrast was administered.  COMPARISON:  Lumbar MRI 01/18/2006  FINDINGS: Segmentation:  Normal  Alignment:  Normal  Vertebrae:  Negative for fracture or mass  Conus medullaris and cauda equina: Conus extends to the L2 level. Conus and cauda equina appear normal.  Paraspinal and other soft tissues: 24 mm cyst posterior liver on the right, this was not included on the prior study. This appears benign. No retroperitoneal mass or adenopathy.  Disc levels:  L1-2: Negative  L2-3: Mild disc bulging without stenosis  L3-4: Mild disc bulging and endplate spurring with progression from the prior study. Mild spinal stenosis has progressed.  L4-5: Mild disc and facet degeneration with mild progression. No significant stenosis.  L5-S1: Disc space narrowing with disc degeneration and spurring with mild progression. Mild foraminal narrowing bilaterally.  IMPRESSION: Mild spinal stenosis L3-4 with progressive degenerative change since the prior study.  Mild progression of disc and facet degeneration L4-5  Mild progression of disc degeneration L5-S1 with mild foraminal narrowing bilaterally.   Electronically Signed   By: Sharon [name] M.D.   On: 11/04/2017 10:33     Plain film x-ray of the pelvis 06/19/2017  AP the pelvis and lateral left hip were obtained. These are compared to  films  that were performed in December. There is progressive yet slight  increased narrowing of the left hip joint space associated with  subchondral sclerosis and subchondral cysts particularly on the femoral  head. On is consistent with progressive osteoarthritis  She reports that  has never smoked. she has never used smokeless tobacco. No results for input(s): HGBA1C, LABURIC in the last 8760 hours.  Objective:  VS:  HT:    WT:   BMI:     BP:   HR: bpm  TEMP: ( )  RESP:  Physical Exam  Ortho Exam Imaging: No results found.  Past Medical/Family/Surgical/Social History: Medications & Allergies reviewed per EMR Patient Active Problem List   Diagnosis Date Noted  . Genetic testing 11/14/2016  . Family history of ovarian cancer   . Family history of colon cancer   . Family history of uterine cancer   . Family history of stomach cancer   . Osteoarthritis of left knee 04/03/2015  . S/P total knee replacement using cement 04/03/2015  . Gait abnormality 12/22/2012  . Swelling of ankle 12/22/2012  . Ringworm   . Hypertension   . Fibroid   . Headache(784.0)   . Arthritis   . Vitamin D deficiency    Past Medical History:  Diagnosis Date  . Arthritis    Osteoarthritis  . Family history of colon cancer   . Family history of ovarian cancer   . Family history of stomach cancer   . Family history of uterine cancer   . Fibroid   . Headache(784.0)   . Hypertension   . Ringworm   . Vitamin D deficiency    Family History  Problem Relation Age of Onset  . Ovarian cancer Mother 22  . Heart disease Maternal Grandfather   . Hypertension Paternal Grandmother   . Ovarian cancer Cousin        maternal first cousin dx in her 49s  . Heart disease Father   . Atrial fibrillation Father   . Heart disease Brother   . Heart disease Brother   . Colon cancer Maternal Aunt        dx in her 47s-60  . Diabetes Paternal Aunt   . Ovarian cancer Maternal Grandmother        dx in her mid 46s  .  Cancer Maternal Grandmother        OVARIAN AND UTERINE  . Uterine cancer Maternal Grandmother        dx in her early 75s  . Stomach cancer Maternal Uncle        dx in his 73s-80s  . Stroke Maternal Uncle   . Leukemia Paternal Uncle    Past Surgical History:  Procedure Laterality Date  . ABDOMINAL HYSTERECTOMY  1990   TAH BSO  . APPENDECTOMY  1969  . FOOT SURGERY     bunionectomy  . KNEE SURGERY  11/2013   arthroscopic L knee  . OOPHORECTOMY     BSO  . Precancerous lesion excised    . TONSILLECTOMY    . TOTAL KNEE ARTHROPLASTY  2012  . TOTAL KNEE ARTHROPLASTY Left 04/03/2015   Procedure: TOTAL KNEE ARTHROPLASTY;  Surgeon: Garald Balding,  MD;  Location: [facility];  Service: Orthopedics;  Laterality: Left;   Social History   Occupational History  . Not on file  Tobacco Use  . Smoking status: Never Smoker  . Smokeless tobacco: Never Used  Substance and Sexual Activity  . Alcohol use: Yes    Alcohol/week: 1.5 oz    Types: 3 Standard drinks or equivalent per week  . Drug use: No  . Sexual activity: Not Currently    Partners: Male    Birth control/protection: Surgical, Post-menopausal

## 2017-11-24 NOTE — Patient Instructions (Signed)

## 2017-11-26 MED ORDER — BUPIVACAINE HCL 0.5 % IJ SOLN
3.0000 mL | INTRAMUSCULAR | Status: AC | PRN
  Administered 2017-11-24: 3 mL via INTRA_ARTICULAR

## 2017-11-26 MED ORDER — TRIAMCINOLONE ACETONIDE 40 MG/ML IJ SUSP
80.0000 mg | INTRAMUSCULAR | Status: AC | PRN
Start: 2017-11-24 — End: 2017-11-24
  Administered 2017-11-24: 80 mg via INTRA_ARTICULAR

## 2018-01-17 IMAGING — MR MR CERVICAL SPINE W/O CM
5 series · 48 of 48 positions shown · non-contrast
Comparison: None.

CLINICAL DATA: Intermittent right neck and shoulder pain and right
arm numbness in ACA distribution.

EXAM:
MRI CERVICAL SPINE WITHOUT CONTRAST
TECHNIQUE: Multiplanar, multisequence MR imaging of the cervical spine was
performed. No intravenous contrast was administered.

[Series 6: T1 · sagittal · 3.0mm · 0.82mm/px · 6 of 15 slices shown]
[im 1/15]
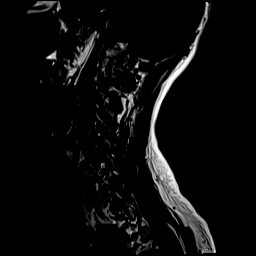
[im 3/15]
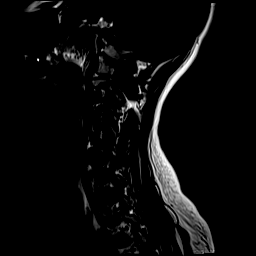
[im 6/15]
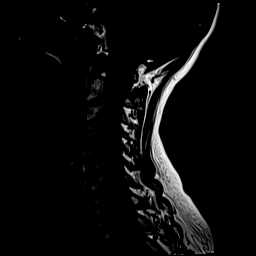
[im 9/15]
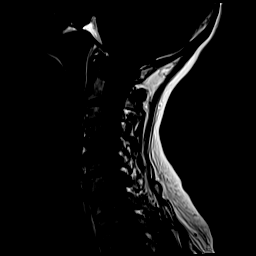
[im 12/15]
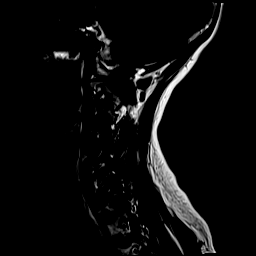
[im 15/15]
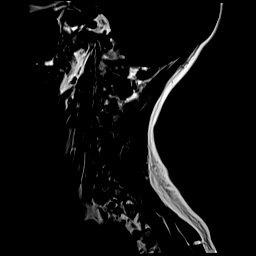

[Series 7: T2 · sagittal · 3.0mm · 0.66mm/px · 7 of 15 slices shown (1 of 2)]
[im 1/15]
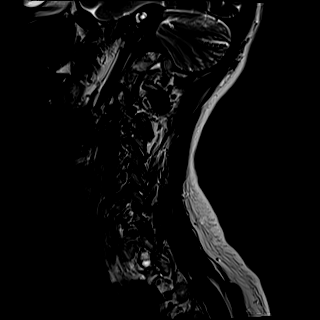
[im 3/15]
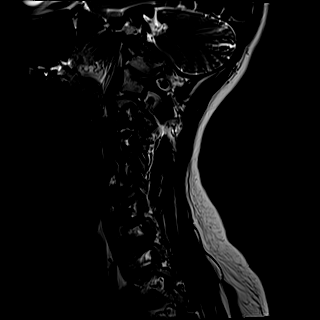
[im 5/15]
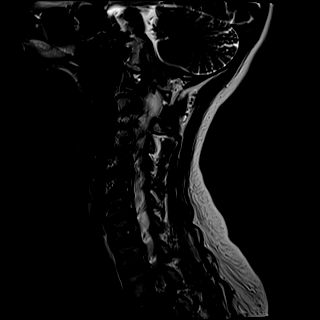
[im 8/15]
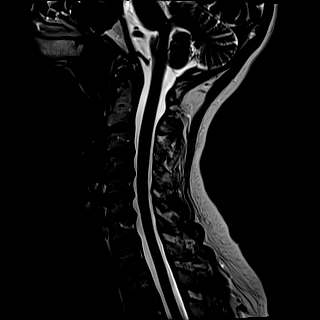
[im 10/15]
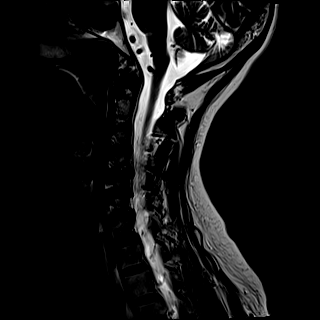
[im 12/15]
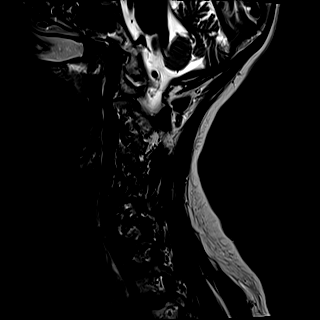
[im 15/15]
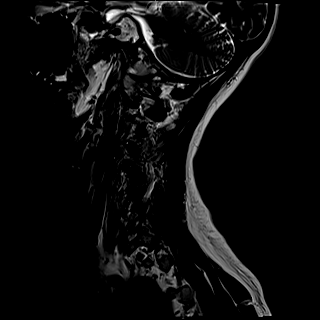

[Series 8: STIR · sagittal · 3.0mm · 0.82mm/px · 7 of 15 slices shown]
[im 1/15]
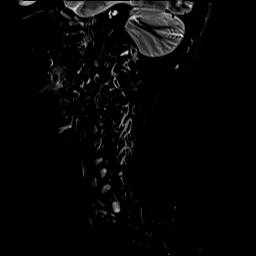
[im 3/15]
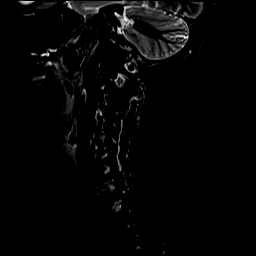
[im 5/15]
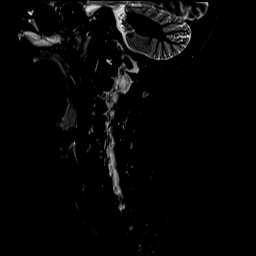
[im 8/15]
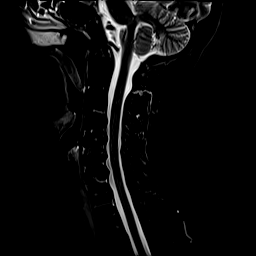
[im 10/15]
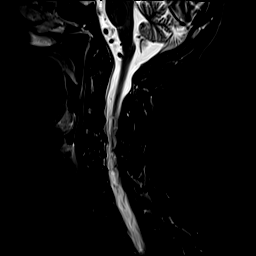
[im 12/15]
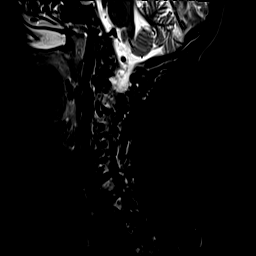
[im 15/15]
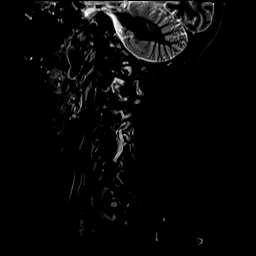

[Series 9: T2 · axial · 3.0mm · 0.50mm/px · z∈[-46,+48]mm · 14 of 30 slices shown (2 of 2)]
[im 1/30]
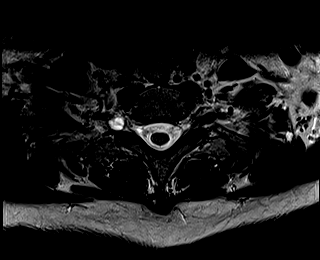
[im 3/30]
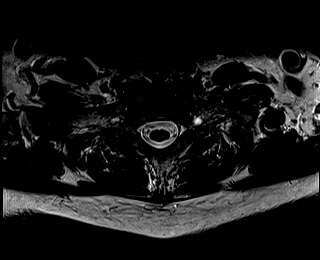
[im 5/30]
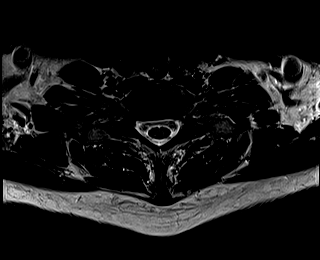
[im 7/30]
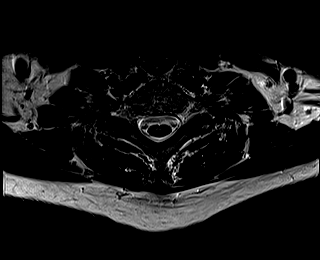
[im 9/30]
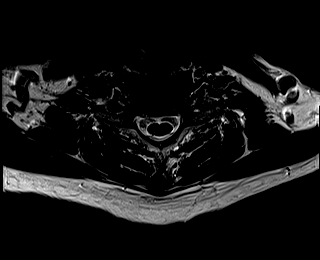
[im 12/30]
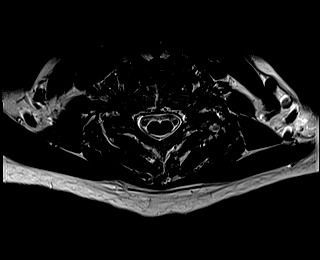
[im 14/30]
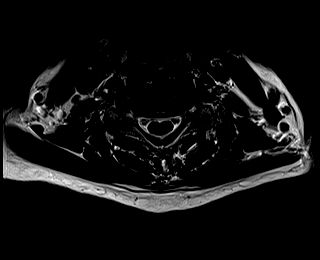
[im 16/30]
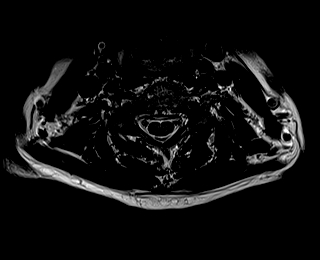
[im 18/30]
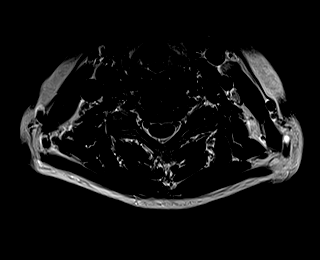
[im 21/30]
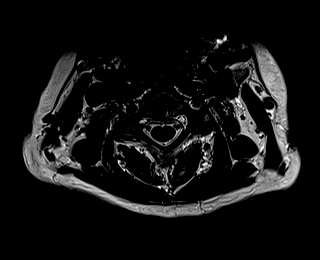
[im 23/30]
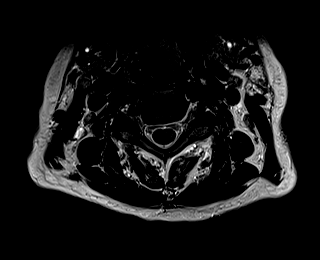
[im 25/30]
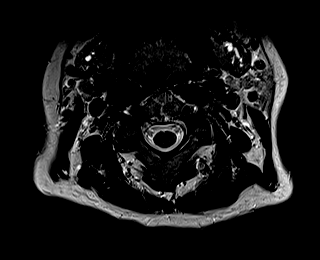
[im 27/30]
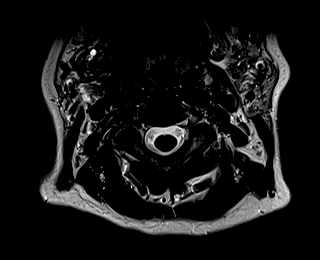
[im 30/30]
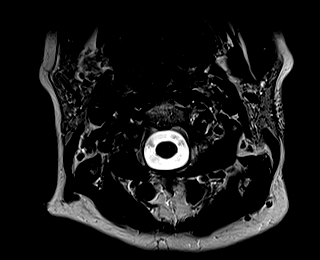

[Series 10: GRE · axial · 3.0mm · 0.83mm/px · z∈[-45,+48]mm · 14 of 30 slices shown]
[im 1/30]
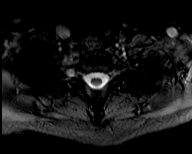
[im 3/30]
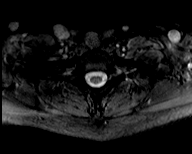
[im 5/30]
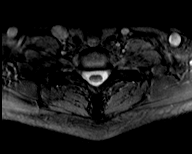
[im 7/30]
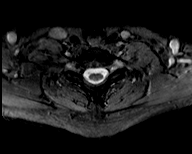
[im 9/30]
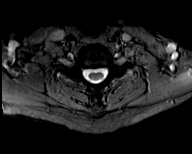
[im 12/30]
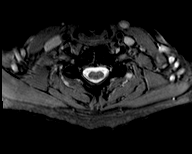
[im 14/30]
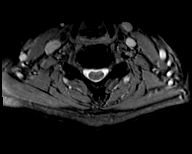
[im 16/30]
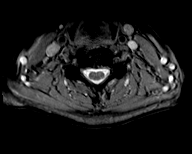
[im 18/30]
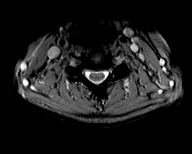
[im 21/30]
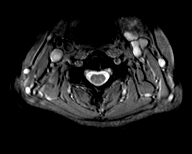
[im 23/30]
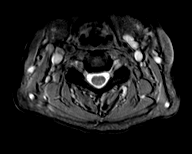
[im 25/30]
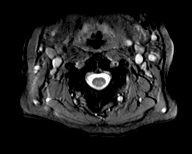
[im 27/30]
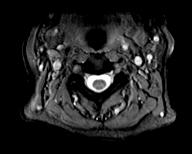
[im 30/30]
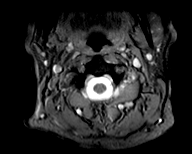

[48 of 48 positions shown; findings below may reference images not displayed]

FINDINGS: Alignment: Normal.

Vertebrae: There is no evidence of fracture. Degenerative endplate
changes are present from C4-C7 including mild degenerative endplate
edema and small Schmorl's nodes, greatest at C5-6.

Cord: Normal signal and morphology.

Posterior Fossa, vertebral arteries, paraspinal tissues:
Unremarkable.

Disc levels:

C2-3: Moderate right and mild left facet arthrosis without disc
herniation or stenosis.

C3-4: Mild disc bulging, right uncovertebral spurring, and mild
facet arthrosis without stenosis. There may be a small synovial cyst
extending anteriorly from the right facet joint without evidence of
neural impingement.

C4-5: Mild disc space narrowing. Broad-based posterior disc
osteophyte complex and left facet arthrosis result in mild right and
mild-to-moderate left neural foraminal stenosis. There is flattening
of the ventral thecal sac without significant spinal stenosis or
spinal cord mass effect.

C5-6: Moderate to severe disc space narrowing. Broad-based posterior
disc osteophyte complex results in mild bilateral neural foraminal
stenosis without spinal stenosis.

C6-7: Moderate disc space narrowing. Mild disc bulging and
uncovertebral spurring without significant stenosis.

C7-T1:  Negative.
IMPRESSION: 1. Moderate cervical disc degeneration without spinal stenosis or
evidence of C8 nerve root impingement.
2. Mild right and mild-to-moderate left neural foraminal stenosis at
C4-5.
3. Mild bilateral foraminal stenosis at C5-6.

## 2018-02-08 DIAGNOSIS — N390 Urinary tract infection, site not specified: Secondary | ICD-10-CM | POA: Diagnosis not present

## 2018-02-24 ENCOUNTER — Telehealth (INDEPENDENT_AMBULATORY_CARE_PROVIDER_SITE_OTHER): Payer: Self-pay | Admitting: Physical Medicine and Rehabilitation

## 2018-02-24 NOTE — Telephone Encounter (Signed)
If hip injection helped a lot then repeat and follow up wit Durward Fortes next

## 2018-02-25 NOTE — Telephone Encounter (Signed)
Patient reports at least 50-60% relief until recently. Scheduled for 5/23.

## 2018-02-25 NOTE — Telephone Encounter (Signed)
Left message for patient to call back to discuss/ schedule. 

## 2018-03-02 DIAGNOSIS — M25552 Pain in left hip: Secondary | ICD-10-CM | POA: Diagnosis not present

## 2018-03-02 DIAGNOSIS — M79652 Pain in left thigh: Secondary | ICD-10-CM | POA: Diagnosis not present

## 2018-03-09 DIAGNOSIS — M25552 Pain in left hip: Secondary | ICD-10-CM | POA: Diagnosis not present

## 2018-03-11 ENCOUNTER — Ambulatory Visit (INDEPENDENT_AMBULATORY_CARE_PROVIDER_SITE_OTHER): Payer: Self-pay

## 2018-03-11 ENCOUNTER — Ambulatory Visit (INDEPENDENT_AMBULATORY_CARE_PROVIDER_SITE_OTHER): Payer: PPO | Admitting: Physical Medicine and Rehabilitation

## 2018-03-11 ENCOUNTER — Encounter (INDEPENDENT_AMBULATORY_CARE_PROVIDER_SITE_OTHER): Payer: Self-pay | Admitting: Physical Medicine and Rehabilitation

## 2018-03-11 DIAGNOSIS — M25552 Pain in left hip: Secondary | ICD-10-CM

## 2018-03-11 NOTE — Progress Notes (Signed)
 .  Numeric Pain Rating Scale and Functional Assessment Average Pain 5   In the last MONTH (on 0-10 scale) has pain interfered with the following?  1. General activity like being  able to carry out your everyday physical activities such as walking, climbing stairs, carrying groceries, or moving a chair?  Rating(2)    -Dye Allergies.  

## 2018-03-11 NOTE — Patient Instructions (Signed)

## 2018-03-11 NOTE — Progress Notes (Signed)
Sharon [name] - 67 y.o. female MRN [id number]  Date of birth: Feb 17, 1951  Office Visit Note: Visit Date: 03/11/2018 PCP: Levin Erp, MD Referred by: Levin Erp, MD  Subjective: Chief Complaint  Patient presents with  . Left Hip - Pain  . Left Leg - Pain   HPI: [name] is a 67 year old female followed by Dr. [name].  Dr. Durward Fortes last saw her in January of this year and completed an MRI of the lumbar spine thinking more of her pain was possibly lumbar related.  Prior intra-articular hip injections have been beneficial but temporary.  Last intra-articular hip injection was in February.  MRI of the lumbar spine is again reviewed below.  Shows pretty mild degenerative changes for her age.  No focal stenosis or disc herniation or nerve compression.  She does report about 1 month of significant left hip and groin pain with some referral in the leg.  No paresthesia.  We will repeat the intra-articular hip injection.  She will follow-up with Dr. Durward Fortes.   ROS Otherwise per HPI.  Assessment & Plan: Visit Diagnoses:  1. Pain in left hip     Plan: No additional findings.   Meds & Orders: No orders of the defined types were placed in this encounter.   Orders Placed This Encounter  Procedures  . Large Joint Inj: L hip joint  . XR C-ARM NO REPORT    Follow-up: Return if symptoms worsen or fail to improve.   Procedures: Large Joint Inj: L hip joint on 03/11/2018 2:12 PM Indications: diagnostic evaluation and pain Details: 22 G 3.5 in needle, fluoroscopy-guided anterior approach  Arthrogram: No  Medications: 80 mg triamcinolone acetonide 40 MG/ML; 3 mL bupivacaine 0.5 % Outcome: tolerated well, no immediate complications  There was excellent flow of contrast producing a partial arthrogram of the hip. The patient did have relief of symptoms during the anesthetic phase of the injection. Procedure, treatment alternatives, risks and benefits explained, specific risks  discussed. Consent was given by the patient. Immediately prior to procedure a time out was called to verify the correct patient, procedure, equipment, support staff and site/side marked as required. Patient was prepped and draped in the usual sterile fashion.      No notes on file   Clinical History: MRI LUMBAR SPINE WITHOUT CONTRAST  TECHNIQUE: Multiplanar, multisequence MR imaging of the lumbar spine was performed. No intravenous contrast was administered.  COMPARISON:  Lumbar MRI 01/18/2006  FINDINGS: Segmentation:  Normal  Alignment:  Normal  Vertebrae:  Negative for fracture or mass  Conus medullaris and cauda equina: Conus extends to the L2 level. Conus and cauda equina appear normal.  Paraspinal and other soft tissues: 24 mm cyst posterior liver on the right, this was not included on the prior study. This appears benign. No retroperitoneal mass or adenopathy.  Disc levels:  L1-2: Negative  L2-3: Mild disc bulging without stenosis  L3-4: Mild disc bulging and endplate spurring with progression from the prior study. Mild spinal stenosis has progressed.  L4-5: Mild disc and facet degeneration with mild progression. No significant stenosis.  L5-S1: Disc space narrowing with disc degeneration and spurring with mild progression. Mild foraminal narrowing bilaterally.  IMPRESSION: Mild spinal stenosis L3-4 with progressive degenerative change since the prior study.  Mild progression of disc and facet degeneration L4-5  Mild progression of disc degeneration L5-S1 with mild foraminal narrowing bilaterally.   Electronically Signed   By: Franchot Gallo M.D.   On: 11/04/2017 10:33  Plain film x-ray of the pelvis 06/19/2017  AP the pelvis and lateral left hip were obtained. These are compared to  films that were performed in December. There is progressive yet slight  increased narrowing of the left hip joint space associated with    subchondral sclerosis and subchondral cysts particularly on the femoral  head. On is consistent with progressive osteoarthritis   She reports that she has never smoked. She has never used smokeless tobacco. No results for input(s): HGBA1C, LABURIC in the last 8760 hours.  Objective:  VS:  HT:    WT:   BMI:     BP:   HR: bpm  TEMP: ( )  RESP:  Physical Exam  Ortho Exam Imaging: No results found.  Past Medical/Family/Surgical/Social History: Medications & Allergies reviewed per EMR, new medications updated. Patient Active Problem List   Diagnosis Date Noted  . Genetic testing 11/14/2016  . Family history of ovarian cancer   . Family history of colon cancer   . Family history of uterine cancer   . Family history of stomach cancer   . Osteoarthritis of left knee 04/03/2015  . S/P total knee replacement using cement 04/03/2015  . Gait abnormality 12/22/2012  . Swelling of ankle 12/22/2012  . Ringworm   . Hypertension   . Fibroid   . Headache(784.0)   . Arthritis   . Vitamin D deficiency    Past Medical History:  Diagnosis Date  . Arthritis    Osteoarthritis  . Family history of colon cancer   . Family history of ovarian cancer   . Family history of stomach cancer   . Family history of uterine cancer   . Fibroid   . Headache(784.0)   . Hypertension   . Ringworm   . Vitamin D deficiency    Family History  Problem Relation Age of Onset  . Ovarian cancer Mother 45  . Heart disease Maternal Grandfather   . Hypertension Paternal Grandmother   . Ovarian cancer Cousin        maternal first cousin dx in her 58s  . Heart disease Father   . Atrial fibrillation Father   . Heart disease Brother   . Heart disease Brother   . Colon cancer Maternal Aunt        dx in her 35s-60  . Diabetes Paternal Aunt   . Ovarian cancer Maternal Grandmother        dx in her mid 61s  . Cancer Maternal Grandmother        OVARIAN AND UTERINE  . Uterine cancer Maternal Grandmother         dx in her early 77s  . Stomach cancer Maternal Uncle        dx in his 38s-80s  . Stroke Maternal Uncle   . Leukemia Paternal Uncle    Past Surgical History:  Procedure Laterality Date  . ABDOMINAL HYSTERECTOMY  1990   TAH BSO  . APPENDECTOMY  1969  . FOOT SURGERY     bunionectomy  . KNEE SURGERY  11/2013   arthroscopic L knee  . OOPHORECTOMY     BSO  . Precancerous lesion excised    . TONSILLECTOMY    . TOTAL KNEE ARTHROPLASTY  2012  . TOTAL KNEE ARTHROPLASTY Left 04/03/2015   Procedure: TOTAL KNEE ARTHROPLASTY;  Surgeon: Garald Balding, MD;  Location: [facility];  Service: Orthopedics;  Laterality: Left;   Social History   Occupational History  . Not on file  Tobacco  Use  . Smoking status: Never Smoker  . Smokeless tobacco: Never Used  Substance and Sexual Activity  . Alcohol use: Yes    Alcohol/week: 1.5 oz    Types: 3 Standard drinks or equivalent per week  . Drug use: No  . Sexual activity: Not Currently    Partners: Male    Birth control/protection: Surgical, Post-menopausal

## 2018-03-22 MED ORDER — BUPIVACAINE HCL 0.5 % IJ SOLN
3.0000 mL | INTRAMUSCULAR | Status: AC | PRN
  Administered 2018-03-11: 3 mL via INTRA_ARTICULAR

## 2018-03-22 MED ORDER — TRIAMCINOLONE ACETONIDE 40 MG/ML IJ SUSP
80.0000 mg | INTRAMUSCULAR | Status: AC | PRN
  Administered 2018-03-11: 80 mg via INTRA_ARTICULAR

## 2018-03-23 DIAGNOSIS — M25552 Pain in left hip: Secondary | ICD-10-CM | POA: Diagnosis not present

## 2018-04-12 ENCOUNTER — Other Ambulatory Visit (HOSPITAL_COMMUNITY): Payer: Self-pay | Admitting: Internal Medicine

## 2018-04-12 DIAGNOSIS — M1990 Unspecified osteoarthritis, unspecified site: Secondary | ICD-10-CM | POA: Diagnosis not present

## 2018-04-12 DIAGNOSIS — I10 Essential (primary) hypertension: Secondary | ICD-10-CM | POA: Diagnosis not present

## 2018-04-12 DIAGNOSIS — R011 Cardiac murmur, unspecified: Secondary | ICD-10-CM

## 2018-04-13 ENCOUNTER — Ambulatory Visit (HOSPITAL_COMMUNITY): Payer: PPO | Attending: Cardiovascular Disease

## 2018-04-13 ENCOUNTER — Other Ambulatory Visit: Payer: Self-pay

## 2018-04-13 ENCOUNTER — Other Ambulatory Visit (HOSPITAL_COMMUNITY): Payer: PPO

## 2018-04-13 DIAGNOSIS — I083 Combined rheumatic disorders of mitral, aortic and tricuspid valves: Secondary | ICD-10-CM | POA: Diagnosis not present

## 2018-04-13 DIAGNOSIS — R011 Cardiac murmur, unspecified: Secondary | ICD-10-CM | POA: Diagnosis not present

## 2018-04-13 DIAGNOSIS — I10 Essential (primary) hypertension: Secondary | ICD-10-CM | POA: Insufficient documentation

## 2018-04-13 DIAGNOSIS — E785 Hyperlipidemia, unspecified: Secondary | ICD-10-CM | POA: Diagnosis not present

## 2018-04-29 DIAGNOSIS — I351 Nonrheumatic aortic (valve) insufficiency: Secondary | ICD-10-CM | POA: Diagnosis not present

## 2018-05-05 DIAGNOSIS — H35361 Drusen (degenerative) of macula, right eye: Secondary | ICD-10-CM | POA: Diagnosis not present

## 2018-05-05 DIAGNOSIS — H35362 Drusen (degenerative) of macula, left eye: Secondary | ICD-10-CM | POA: Diagnosis not present

## 2018-05-05 DIAGNOSIS — M25552 Pain in left hip: Secondary | ICD-10-CM | POA: Diagnosis not present

## 2018-05-12 DIAGNOSIS — M25552 Pain in left hip: Secondary | ICD-10-CM | POA: Diagnosis not present

## 2018-05-19 DIAGNOSIS — M25552 Pain in left hip: Secondary | ICD-10-CM | POA: Diagnosis not present

## 2018-05-25 ENCOUNTER — Encounter (INDEPENDENT_AMBULATORY_CARE_PROVIDER_SITE_OTHER): Payer: Self-pay

## 2018-05-25 ENCOUNTER — Encounter (INDEPENDENT_AMBULATORY_CARE_PROVIDER_SITE_OTHER): Payer: Self-pay | Admitting: Orthopaedic Surgery

## 2018-05-25 ENCOUNTER — Ambulatory Visit (INDEPENDENT_AMBULATORY_CARE_PROVIDER_SITE_OTHER): Payer: PPO

## 2018-05-25 ENCOUNTER — Ambulatory Visit (INDEPENDENT_AMBULATORY_CARE_PROVIDER_SITE_OTHER): Payer: PPO | Admitting: Orthopaedic Surgery

## 2018-05-25 VITALS — BP 142/86 | HR 68 | Ht 67.0 in | Wt 177.0 lb

## 2018-05-25 DIAGNOSIS — M25552 Pain in left hip: Secondary | ICD-10-CM

## 2018-05-25 DIAGNOSIS — M1612 Unilateral primary osteoarthritis, left hip: Secondary | ICD-10-CM

## 2018-05-25 NOTE — Progress Notes (Addendum)
Office Visit Note   Patient: Sharon [name]           Date of Birth: 02-May-1951           MRN: [id number] Visit Date: 05/25/2018              Requested by: Sharon Erp, MD [place], [address], [place] [address] PCP: Sharon Erp, MD   Assessment & Plan: Visit Diagnoses:  1. Unilateral primary osteoarthritis, left hip   2. Pain in left hip      Plan: #1: Consultation with Sharon [name] for anterior hip replacement at her request. #2: Follow-up as needed  Follow-Up Instructions: Return next available. With Dr Sharon [name] for consultation for Anterior Hip Replacement  Face-to-face time spent with patient was greater than 40 minutes.  Greater than 50% of the time was spent in counseling and coordination of care.  Orders:  Orders Placed This Encounter  Procedures  . XR HIP UNILAT W OR W/O PELVIS 2-3 VIEWS LEFT   No orders of the defined types were placed in this encounter.     Procedures: No procedures performed   Clinical Data: No additional findings.   Subjective: Chief Complaint  Patient presents with  . Follow-up    L HIP PAIN SINCE JAN. NO INJURY HAD 3 INJECTIONS W DR [name] LAST ON WAS IN MAY 2019 HELPED FOR A WHILE BUT SYPMTOMS ARE COMING BACK. DISCUSS NEXT OPTIONS    HPI  Sharon [name] is a very pleasant 67 year old white female seen today for evaluation of her left hip.  She has had problems with the left hip in the past and had been noted radiographically to have some degenerative changes and cystic changes in the femoral acetabular area.  THR EE injections by Sharon [name].  She has had good results until most recently. Now her symptoms have returned.  She is actually even having pain into her knee.  This pain however post injection in  May of the hip actually improved that day to where it was nonexistent for short period of time.  She comes in today now considering possible total hip replacement.  Review of Systems  Constitutional: Positive for  fatigue. Negative for fever.  HENT: Negative for ear pain.   Eyes: Negative for pain.  Respiratory: Negative for cough and shortness of breath.   Cardiovascular: Positive for leg swelling.  Gastrointestinal: Negative for constipation and diarrhea.  Genitourinary: Negative for difficulty urinating.  Musculoskeletal: Positive for back pain and neck pain.  Skin: Negative for rash.  Allergic/Immunologic: Negative for food allergies.  Neurological: Positive for weakness. Negative for numbness.  Hematological: Bruises/bleeds easily.  Psychiatric/Behavioral: Positive for sleep disturbance.     Objective: Vital Signs: BP (!) 142/86 (BP Location: Left Arm, Patient Position: Sitting, Cuff Size: Normal)   Pulse 68   Ht 5\' 7"  (1.702 m)   Wt 177 lb (80.3 kg)   LMP 02/17/1989   BMI 27.72 kg/m   Physical Exam  Constitutional: She is oriented to person, place, and time. She appears well-developed and well-nourished.  HENT:  Mouth/Throat: Oropharynx is clear and moist.  Eyes: Pupils are equal, round, and reactive to light. EOM are normal.  Pulmonary/Chest: Effort normal.  Neurological: She is alert and oriented to person, place, and time.  Skin: Skin is warm and dry.  Psychiatric: She has a normal mood and affect. Her behavior is normal.    Ortho Exam  Exam today of the left hip reveals limited internal and  external rotation to only about 10 to 15 degrees.  Flexion only to about 90 to 95 degrees.  Significant pain with internal and external rotation.  Neurovascular intact distally.  Specialty Comments:  No specialty comments available.  Imaging: Xr Hip Unilat W Or W/o Pelvis 2-3 Views Left  Result Date: 05/25/2018 2 view x-ray of the left knee now reveals multiple cysts in the acetabulum of the left hip.  There is periarticular spurring with marked joint space narrowing in the central third of the femoral acetabular area.  Inferiorly also has some spurring and sclerosing and narrowing of  the joint space.    PMFS History: Current Outpatient Medications  Medication Sig Dispense Refill  . amoxicillin (AMOXIL) 500 MG capsule Take 2,000 mg by mouth once. 4-500 mg capsules prior to dental procedures.    Marland Kitchen aspirin-acetaminophen-caffeine (EXCEDRIN MIGRAINE) 250-250-65 MG per tablet Take 1 tablet by mouth every 6 (six) hours as needed for headache.    Marland Kitchen atorvastatin (LIPITOR) 10 MG tablet Take 10 mg by mouth daily.    . cycloSPORINE (RESTASIS) 0.05 % ophthalmic emulsion 1 drop 2 (two) times daily.    . fluticasone (FLONASE) 50 MCG/ACT nasal spray Place 1 spray into both nostrils daily.    . hydrochlorothiazide (HYDRODIURIL) 25 MG tablet Take 25 mg by mouth daily.    . SUMAtriptan Succinate (IMITREX PO) Take by mouth as needed.     . venlafaxine XR (EFFEXOR-XR) 37.5 MG 24 hr capsule Take 1 capsule (37.5 mg total) by mouth daily. 90 capsule 3  . verapamil (COVERA HS) 240 MG (CO) 24 hr tablet Take 240 mg by mouth 2 (two) times daily.     . Vitamin D, Ergocalciferol, (DRISDOL) 50000 units CAPS capsule Take by mouth.     No current facility-administered medications for this visit.     Patient Active Problem List   Diagnosis Date Noted  . Unilateral primary osteoarthritis, left hip 05/25/2018  . Genetic testing 11/14/2016  . Family history of ovarian cancer   . Family history of colon cancer   . Family history of uterine cancer   . Family history of stomach cancer   . Osteoarthritis of left knee 04/03/2015  . S/P total knee replacement using cement 04/03/2015  . Gait abnormality 12/22/2012  . Swelling of ankle 12/22/2012  . Ringworm   . Hypertension   . Fibroid   . Headache(784.0)   . Arthritis   . Vitamin D deficiency    Past Medical History:  Diagnosis Date  . Arthritis    Osteoarthritis  . Family history of colon cancer   . Family history of ovarian cancer   . Family history of stomach cancer   . Family history of uterine cancer   . Fibroid   . Headache(784.0)     . Hypertension   . Ringworm   . Vitamin D deficiency     Family History  Problem Relation Age of Onset  . Ovarian cancer Mother 56  . Heart disease Maternal Grandfather   . Hypertension Paternal Grandmother   . Ovarian cancer Cousin        maternal first cousin dx in her 8s  . Heart disease Father   . Atrial fibrillation Father   . Heart disease Brother   . Heart disease Brother   . Colon cancer Maternal Aunt        dx in her 45s-60  . Diabetes Paternal Aunt   . Ovarian cancer Maternal Grandmother  dx in her mid 20s  . Cancer Maternal Grandmother        OVARIAN AND UTERINE  . Uterine cancer Maternal Grandmother        dx in her early 5s  . Stomach cancer Maternal Uncle        dx in his 42s-80s  . Stroke Maternal Uncle   . Leukemia Paternal Uncle     Past Surgical History:  Procedure Laterality Date  . ABDOMINAL HYSTERECTOMY  1990   TAH BSO  . APPENDECTOMY  1969  . FOOT SURGERY     bunionectomy  . KNEE SURGERY  11/2013   arthroscopic L knee  . OOPHORECTOMY     BSO  . Precancerous lesion excised    . TONSILLECTOMY    . TOTAL KNEE ARTHROPLASTY  2012  . TOTAL KNEE ARTHROPLASTY Left 04/03/2015   Procedure: TOTAL KNEE ARTHROPLASTY;  Surgeon: Garald Balding, MD;  Location: [facility];  Service: Orthopedics;  Laterality: Left;   Social History   Occupational History  . Not on file  Tobacco Use  . Smoking status: Never Smoker  . Smokeless tobacco: Never Used  Substance and Sexual Activity  . Alcohol use: Yes    Alcohol/week: 1.8 oz    Types: 3 Standard drinks or equivalent per week  . Drug use: No  . Sexual activity: Not Currently    Partners: Male    Birth control/protection: Surgical, Post-menopausal

## 2018-05-26 DIAGNOSIS — M25552 Pain in left hip: Secondary | ICD-10-CM | POA: Diagnosis not present

## 2018-05-28 DIAGNOSIS — L603 Nail dystrophy: Secondary | ICD-10-CM | POA: Diagnosis not present

## 2018-05-28 DIAGNOSIS — L820 Inflamed seborrheic keratosis: Secondary | ICD-10-CM | POA: Diagnosis not present

## 2018-05-28 DIAGNOSIS — L03011 Cellulitis of right finger: Secondary | ICD-10-CM | POA: Diagnosis not present

## 2018-06-08 DIAGNOSIS — M25552 Pain in left hip: Secondary | ICD-10-CM | POA: Diagnosis not present

## 2018-06-14 ENCOUNTER — Ambulatory Visit (INDEPENDENT_AMBULATORY_CARE_PROVIDER_SITE_OTHER): Payer: PPO | Admitting: Orthopaedic Surgery

## 2018-06-14 ENCOUNTER — Encounter (INDEPENDENT_AMBULATORY_CARE_PROVIDER_SITE_OTHER): Payer: Self-pay | Admitting: Orthopaedic Surgery

## 2018-06-14 DIAGNOSIS — M1612 Unilateral primary osteoarthritis, left hip: Secondary | ICD-10-CM | POA: Diagnosis not present

## 2018-06-14 MED ORDER — TRAMADOL HCL 50 MG PO TABS: 50.0000 mg | ORAL_TABLET | Freq: Four times a day (QID) | ORAL | 0 refills | Status: DC | PRN

## 2018-06-14 NOTE — Progress Notes (Signed)
Office Visit Note   Patient: Sharon [name]           Date of Birth: [birth year]           MRN: [id number] Visit Date: 06/14/2018              Requested by: Levin Erp, MD [place], [address], [place] [address] PCP: Levin Erp, MD   Assessment & Plan: Visit Diagnoses:  1. Unilateral primary osteoarthritis, left hip     Plan: At this point I have agreed that hip replacement surgery would be her next alternative.  She is tried and failed all forms of conservative treatment.  Her x-rays show progressive worsening of left hip disease.  Given the failure of all conservative treatment measures including injections she would like to proceed with the surgery.  We had a long and thorough discussion about hip replacement surgery.  We went over x-rays.  I gave her handout on anterior hip replacement surgery.  We talked about the risk and benefits of the surgery as well as what her intraoperative and postoperative course with involved.  She would like to have the surgery sometime in the near future.  All question concerns were answered and addressed.  We will work on getting the surgery scheduled.  Follow-Up Instructions: Return for 2 weeks post-op.   Orders:  No orders of the defined types were placed in this encounter.  Meds ordered this encounter  Medications  . traMADol (ULTRAM) 50 MG tablet    Sig: Take 1-2 tablets (50-100 mg total) by mouth every 6 (six) hours as needed.    Dispense:  50 tablet    Refill:  0      Procedures: No procedures performed   Clinical Data: No additional findings.   Subjective: Chief Complaint  Patient presents with  . Left Hip - Pain  Patient is a very pleasant and active 67 year old female sent from Dr. Durward Fortes for considering a left total hip arthroplasty.  She has known and well-documented osteoarthritis and degenerative joint disease in the left hip that is been slowly worsening over about 3 years now.  She is worked on  activity modification.  She takes anti-inflammatories that are starting to bother her stomach.  She has had multiple intra-articular steroid injections under direct fluoroscopy in her left hip.  At this point her pain is daily and it is detrimental effect directives daily living, quality of life, and mobility.  She has stiffness with activities of daily living.  She has a hard time putting on her shoes and socks on that side.  It does wake her up at night.  Anti-inflammatories are now starting to bother her quite a bit terms of gastritis especially at night.  She would like to consider hip replacement surgery at this standpoint.  She has well-documented evidence of osteoarthritis and generally disease of left hip.  HPI  Review of Systems She currently denies any chest pain, shortness of breath, fever, chills, nausea, vomiting.  She is not a diabetic and not on blood thinning medication.  She is not a smoker.  Objective: Vital Signs: LMP 02/17/1989   Physical Exam She is alert and oriented x3 and in no acute distress.  When she was sitting down and got up to walk out she has a definite limp with stiffness in her left hip. Ortho Exam Examination of both hips were obtained in the office.  Her right hip moves normally.  Her left hip has  significant stiffness and pain on internal and external rotation. Specialty Comments:  No specialty comments available.  Imaging: No results found. I was able to independently review x-rays of her pelvis showing her left and right hips.  I saw x-rays from 2017 2018 and 2019.  She is got progressively worsening left hip joint space narrowing with para-articular osteophytes and sclerotic changes.  There is even cystic changes in the acetabulum.  PMFS History: Patient Active Problem List   Diagnosis Date Noted  . Unilateral primary osteoarthritis, left hip 05/25/2018  . Genetic testing 11/14/2016  . Family history of ovarian cancer   . Family history of colon  cancer   . Family history of uterine cancer   . Family history of stomach cancer   . Osteoarthritis of left knee 04/03/2015  . S/P total knee replacement using cement 04/03/2015  . Gait abnormality 12/22/2012  . Swelling of ankle 12/22/2012  . Ringworm   . Hypertension   . Fibroid   . Headache(784.0)   . Arthritis   . Vitamin D deficiency    Past Medical History:  Diagnosis Date  . Arthritis    Osteoarthritis  . Family history of colon cancer   . Family history of ovarian cancer   . Family history of stomach cancer   . Family history of uterine cancer   . Fibroid   . Headache(784.0)   . Hypertension   . Ringworm   . Vitamin D deficiency     Family History  Problem Relation Age of Onset  . Ovarian cancer Mother 24  . Heart disease Maternal Grandfather   . Hypertension Paternal Grandmother   . Ovarian cancer Cousin        maternal first cousin dx in her 32s  . Heart disease Father   . Atrial fibrillation Father   . Heart disease Brother   . Heart disease Brother   . Colon cancer Maternal Aunt        dx in her 75s-60  . Diabetes Paternal Aunt   . Ovarian cancer Maternal Grandmother        dx in her mid 29s  . Cancer Maternal Grandmother        OVARIAN AND UTERINE  . Uterine cancer Maternal Grandmother        dx in her early 52s  . Stomach cancer Maternal Uncle        dx in his 6s-80s  . Stroke Maternal Uncle   . Leukemia Paternal Uncle     Past Surgical History:  Procedure Laterality Date  . ABDOMINAL HYSTERECTOMY  1990   TAH BSO  . APPENDECTOMY  1969  . FOOT SURGERY     bunionectomy  . KNEE SURGERY  11/2013   arthroscopic L knee  . OOPHORECTOMY     BSO  . Precancerous lesion excised    . TONSILLECTOMY    . TOTAL KNEE ARTHROPLASTY  2012  . TOTAL KNEE ARTHROPLASTY Left 04/03/2015   Procedure: TOTAL KNEE ARTHROPLASTY;  Surgeon: Garald Balding, MD;  Location: [facility];  Service: Orthopedics;  Laterality: Left;   Social History   Occupational History    . Not on file  Tobacco Use  . Smoking status: Never Smoker  . Smokeless tobacco: Never Used  Substance and Sexual Activity  . Alcohol use: Yes    Alcohol/week: 3.0 standard drinks    Types: 3 Standard drinks or equivalent per week  . Drug use: No  . Sexual activity: Not Currently  Partners: Male    Birth control/protection: Surgical, Post-menopausal

## 2018-07-01 DIAGNOSIS — M25552 Pain in left hip: Secondary | ICD-10-CM | POA: Diagnosis not present

## 2018-07-07 DIAGNOSIS — M25552 Pain in left hip: Secondary | ICD-10-CM | POA: Diagnosis not present

## 2018-07-08 ENCOUNTER — Other Ambulatory Visit (INDEPENDENT_AMBULATORY_CARE_PROVIDER_SITE_OTHER): Payer: Self-pay | Admitting: Physician Assistant

## 2018-07-13 ENCOUNTER — Other Ambulatory Visit (INDEPENDENT_AMBULATORY_CARE_PROVIDER_SITE_OTHER): Payer: Self-pay

## 2018-07-13 NOTE — Pre-Procedure Instructions (Signed)
Wanda Rideout Steeber  07/13/2018      [facility] [place], [address] AT [facility] [address] [place] [address] Phone: [phone] Fax: [phone]    Your procedure is scheduled on Tuesday October 1.  Report to [facility] Admitting at 1:15 P.M.  Call this number if you have problems the morning of surgery:  [phone]   Remember:  Do not eat or drink after midnight.    Take these medicines the morning of surgery with A SIP OF WATER:   Verapamil (Covera) Tramadol (ultram) if needed Flonase Eye drops  7 days prior to surgery STOP taking any Aspirin(unless otherwise instructed by your surgeon), Aleve, Naproxen, Ibuprofen, Motrin, Advil, Goody's, BC's, all herbal medications, fish oil, and all vitamins     Do not wear jewelry, make-up or nail polish.  Do not wear lotions, powders, or perfumes, or deodorant.  Do not shave 48 hours prior to surgery.  Men may shave face and neck.  Do not bring valuables to the hospital.  [facility] is not responsible for any belongings or valuables.  Contacts, dentures or bridgework may not be worn into surgery.  Leave your suitcase in the car.  After surgery it may be brought to your room.  For patients admitted to the hospital, discharge time will be determined by your treatment team.  Patients discharged the day of surgery will not be allowed to drive home.   Special instructions:    - Preparing For Surgery  Before surgery, you can play an important role. Because skin is not sterile, your skin needs to be as free of germs as possible. You can reduce the number of germs on your skin by washing with CHG (chlorahexidine gluconate) Soap before surgery.  CHG is an antiseptic cleaner which kills germs and bonds with the skin to continue killing germs even after washing.    Oral Hygiene is also important to reduce your risk of infection.  Remember -  BRUSH YOUR TEETH THE MORNING OF SURGERY WITH YOUR REGULAR TOOTHPASTE  Please do not use if you have an allergy to CHG or antibacterial soaps. If your skin becomes reddened/irritated stop using the CHG.  Do not shave (including legs and underarms) for at least 48 hours prior to first CHG shower. It is OK to shave your face.  Please follow these instructions carefully.   1. Shower the NIGHT BEFORE SURGERY and the MORNING OF SURGERY with CHG.   2. If you chose to wash your hair, wash your hair first as usual with your normal shampoo.  3. After you shampoo, rinse your hair and body thoroughly to remove the shampoo.  4. Use CHG as you would any other liquid soap. You can apply CHG directly to the skin and wash gently with a scrungie or a clean washcloth.   5. Apply the CHG Soap to your body ONLY FROM THE NECK DOWN.  Do not use on open wounds or open sores. Avoid contact with your eyes, ears, mouth and genitals (private parts). Wash Face and genitals (private parts)  with your normal soap.  6. Wash thoroughly, paying special attention to the area where your surgery will be performed.  7. Thoroughly rinse your body with warm water from the neck down.  8. DO NOT shower/wash with your normal soap after using and rinsing off the CHG Soap.  9. Pat yourself dry with a CLEAN  TOWEL.  10. Wear CLEAN PAJAMAS to bed the night before surgery, wear comfortable clothes the morning of surgery  11. Place CLEAN SHEETS on your bed the night of your first shower and DO NOT SLEEP WITH PETS.    Day of Surgery:  Do not apply any deodorants/lotions.  Please wear clean clothes to the hospital/surgery center.   Remember to brush your teeth WITH YOUR REGULAR TOOTHPASTE.    Please read over the following fact sheets that you were given. Coughing and Deep Breathing and Surgical Site Infection Prevention

## 2018-07-14 ENCOUNTER — Encounter (HOSPITAL_COMMUNITY): Payer: Self-pay

## 2018-07-14 ENCOUNTER — Encounter (HOSPITAL_COMMUNITY)
Admission: RE | Admit: 2018-07-14 | Discharge: 2018-07-14 | Disposition: A | Discharge status: Home / Self Care | Payer: PPO | Source: Ambulatory Visit | Attending: Orthopaedic Surgery | Admitting: Orthopaedic Surgery

## 2018-07-14 DIAGNOSIS — I10 Essential (primary) hypertension: Secondary | ICD-10-CM | POA: Insufficient documentation

## 2018-07-14 DIAGNOSIS — Z01818 Encounter for other preprocedural examination: Secondary | ICD-10-CM | POA: Diagnosis not present

## 2018-07-14 DIAGNOSIS — M1612 Unilateral primary osteoarthritis, left hip: Secondary | ICD-10-CM | POA: Insufficient documentation

## 2018-07-14 DIAGNOSIS — I440 Atrioventricular block, first degree: Secondary | ICD-10-CM | POA: Diagnosis not present

## 2018-07-14 HISTORY — DX: Cardiac murmur, unspecified: R01.1

## 2018-07-14 LAB — BASIC METABOLIC PANEL
ANION GAP: 11 (ref 5–15)
BUN: 20 mg/dL (ref 8–23)
CALCIUM: 9.8 mg/dL (ref 8.9–10.3)
CO2: 25 mmol/L (ref 22–32)
Chloride: 106 mmol/L (ref 98–111)
Creatinine, Ser: 0.73 mg/dL (ref 0.44–1.00)
GFR calc non Af Amer: >60 mL/min (ref >60)
Glucose, Bld: 111 mg/dL — ABNORMAL HIGH (ref 70–99)
Potassium: 3.7 mmol/L (ref 3.5–5.1)
Sodium: 142 mmol/L (ref 135–145)

## 2018-07-14 LAB — CBC
HCT: 40.9 % (ref 36.0–46.0)
Hemoglobin: 13.4 g/dL (ref 12.0–15.0)
MCH: 31 pg (ref 26.0–34.0)
MCHC: 32.8 g/dL (ref 30.0–36.0)
MCV: 94.7 fL (ref 78.0–100.0)
Platelets: 334 10*3/uL (ref 150–400)
RBC: 4.32 MIL/uL (ref 3.87–5.11)
RDW: 11.8 % (ref 11.5–15.5)
WBC: 6.3 10*3/uL (ref 4.0–10.5)

## 2018-07-14 LAB — SURGICAL PCR SCREEN
MRSA, PCR: NEGATIVE
STAPHYLOCOCCUS AUREUS: NEGATIVE

## 2018-07-15 DIAGNOSIS — M25552 Pain in left hip: Secondary | ICD-10-CM | POA: Diagnosis not present

## 2018-07-15 NOTE — Progress Notes (Signed)
Anesthesia Chart Review:  Case:  [id number] Date/Time:  07/20/18 1507   Procedure:  LEFT TOTAL HIP ARTHROPLASTY ANTERIOR APPROACH (Left )   Anesthesia type:  Choice   Pre-op diagnosis:  osteoarthritis left hip   Location:  [facility]   Surgeon:  Mcarthur Rossetti, MD      DISCUSSION: 67 yo female never smoker. Pertinent hx includes HTN, Heart murmur. S/p Left TKA 5/36/6440 without complication.  Pt had echo 04/13/2018 showing EF 60-65%, normal wall motion. Moderate aortic regurgitation. Mild MR, Mild TR. She is being followed by Dr. Levin Erp for this. Per his last note 04/29/2018 "Ee reviewed that her EF was 60 to 65%.  We reviewed the parameters for her diagnosis of aortic insufficiency.  Based on her study she is considered very low risk and repeat echocardiogram according to current recommendations will be 3 to 5 years.  We will do that earlier if you feel there is some change in her clinical status."  Anticipate she can proceed as planned barring acute status change.  VS: BP 137/82   Pulse 69   Temp 36.8 C   Resp 20   Ht 5\' 7"  (1.702 m)   Wt 81.3 kg   LMP 02/17/1989   SpO2 97%   BMI 28.07 kg/m   PROVIDERS: Levin Erp, MD is PCP   LABS: Labs reviewed: Acceptable for surgery. (all labs ordered are listed, but only abnormal results are displayed)  Labs Reviewed  BASIC METABOLIC PANEL - Abnormal; Notable for the following components:      Result Value   Glucose, Bld 111 (*)    All other components within normal limits  SURGICAL PCR SCREEN  CBC     IMAGES: CHEST  2 VIEW 04/13/2015  COMPARISON:  03/22/2015.  04/16/2011 .  FINDINGS: Mediastinum hilar structures normal. Mild left base pleural parenchymal thickening consistent with scarring again noted . Lungs are clear of acute infiltrate. Heart size normal. No pleural effusion or pneumothorax. Surgical clips right axilla.  IMPRESSION: No acute cardiopulmonary disease.   EKG: 07/14/2018:  Sinus rhythm with 1st degree A-V block. No significant change since last tracing  CV: TTE 04/13/2018: Study Conclusions  - Left ventricle: The cavity size was normal. Systolic function was   normal. The estimated ejection fraction was in the range of 60%   to 65%. Wall motion was normal; there were no regional wall   motion abnormalities. - Aortic valve: Transvalvular velocity was within the normal range.   There was no stenosis. There was moderate regurgitation.   Regurgitation pressure half-time: 405 ms. - Mitral valve: Transvalvular velocity was within the normal range.   There was no evidence for stenosis. There was mild regurgitation. - Right ventricle: The cavity size was normal. Wall thickness was   normal. Systolic function was normal. - Atrial septum: No defect or patent foramen ovale was identified   by color flow Doppler. - Tricuspid valve: There was mild regurgitation. - Pulmonary arteries: Systolic pressure was within the normal   range. PA peak pressure: 32 mm Hg (S).   Past Medical History:  Diagnosis Date  . Arthritis    Osteoarthritis  . Family history of colon cancer   . Family history of ovarian cancer   . Family history of stomach cancer   . Family history of uterine cancer   . Fibroid   . Headache(784.0)   . Heart murmur   . Hypertension   . Ringworm   .  Vitamin D deficiency     Past Surgical History:  Procedure Laterality Date  . ABDOMINAL HYSTERECTOMY  1990   TAH BSO  . APPENDECTOMY  1969  . FOOT SURGERY     bunionectomy  . KNEE SURGERY  11/2013   arthroscopic L knee  . OOPHORECTOMY     BSO  . Precancerous lesion excised    . TONSILLECTOMY    . TOTAL KNEE ARTHROPLASTY  2012  . TOTAL KNEE ARTHROPLASTY Left 04/03/2015   Procedure: TOTAL KNEE ARTHROPLASTY;  Surgeon: Garald Balding, MD;  Location: [facility];  Service: Orthopedics;  Laterality: Left;    MEDICATIONS: . amoxicillin (AMOXIL) 500 MG capsule  . aspirin-acetaminophen-caffeine  (EXCEDRIN MIGRAINE) 250-250-65 MG per tablet  . atorvastatin (LIPITOR) 10 MG tablet  . Calcium Carbonate-Vitamin D (CALCIUM 500 + D PO)  . cycloSPORINE (RESTASIS) 0.05 % ophthalmic emulsion  . fluticasone (FLONASE) 50 MCG/ACT nasal spray  . hydrochlorothiazide (HYDRODIURIL) 25 MG tablet  . SUMAtriptan (IMITREX) 100 MG tablet  . traMADol (ULTRAM) 50 MG tablet  . verapamil (COVERA HS) 240 MG (CO) 24 hr tablet   No current facility-administered medications for this encounter.     Wynonia Musty [facility] Short Stay Center/Anesthesiology Phone [phone] 07/15/2018 12:09 PM

## 2018-07-19 MED ORDER — TRANEXAMIC ACID 1000 MG/10ML IV SOLN
1000.0000 mg | INTRAVENOUS | Status: AC
  Administered 2018-07-20: 1000 mg via INTRAVENOUS
  Filled 2018-07-19: qty 1000

## 2018-07-20 ENCOUNTER — Inpatient Hospital Stay (HOSPITAL_COMMUNITY)
Admission: RE | Admit: 2018-07-20 | Discharge: 2018-07-22 | DRG: 470 | Disposition: A | Discharge status: Home Health | Payer: PPO | Attending: Orthopaedic Surgery | Admitting: Orthopaedic Surgery

## 2018-07-20 ENCOUNTER — Encounter (HOSPITAL_COMMUNITY): Payer: Self-pay | Admitting: Surgery

## 2018-07-20 ENCOUNTER — Other Ambulatory Visit: Payer: Self-pay

## 2018-07-20 ENCOUNTER — Inpatient Hospital Stay (HOSPITAL_COMMUNITY): Payer: PPO | Admitting: Anesthesiology

## 2018-07-20 ENCOUNTER — Inpatient Hospital Stay (HOSPITAL_COMMUNITY): Payer: PPO | Admitting: Physician Assistant

## 2018-07-20 ENCOUNTER — Inpatient Hospital Stay (HOSPITAL_COMMUNITY): Payer: PPO

## 2018-07-20 ENCOUNTER — Encounter (HOSPITAL_COMMUNITY)
Admission: RE | Disposition: A | Discharge status: Home Health | Payer: Self-pay | Source: Home / Self Care | Attending: Orthopaedic Surgery

## 2018-07-20 DIAGNOSIS — E7800 Pure hypercholesterolemia, unspecified: Secondary | ICD-10-CM | POA: Diagnosis not present

## 2018-07-20 DIAGNOSIS — Z23 Encounter for immunization: Secondary | ICD-10-CM

## 2018-07-20 DIAGNOSIS — Z96642 Presence of left artificial hip joint: Secondary | ICD-10-CM | POA: Diagnosis not present

## 2018-07-20 DIAGNOSIS — D62 Acute posthemorrhagic anemia: Secondary | ICD-10-CM | POA: Diagnosis not present

## 2018-07-20 DIAGNOSIS — M1612 Unilateral primary osteoarthritis, left hip: Secondary | ICD-10-CM | POA: Diagnosis not present

## 2018-07-20 DIAGNOSIS — Z96652 Presence of left artificial knee joint: Secondary | ICD-10-CM | POA: Diagnosis not present

## 2018-07-20 DIAGNOSIS — Z90710 Acquired absence of both cervix and uterus: Secondary | ICD-10-CM | POA: Diagnosis not present

## 2018-07-20 DIAGNOSIS — Z419 Encounter for procedure for purposes other than remedying health state, unspecified: Secondary | ICD-10-CM

## 2018-07-20 DIAGNOSIS — Z9181 History of falling: Secondary | ICD-10-CM

## 2018-07-20 DIAGNOSIS — E559 Vitamin D deficiency, unspecified: Secondary | ICD-10-CM | POA: Diagnosis not present

## 2018-07-20 DIAGNOSIS — M25552 Pain in left hip: Secondary | ICD-10-CM | POA: Diagnosis present

## 2018-07-20 DIAGNOSIS — Z471 Aftercare following joint replacement surgery: Secondary | ICD-10-CM | POA: Diagnosis not present

## 2018-07-20 DIAGNOSIS — I10 Essential (primary) hypertension: Secondary | ICD-10-CM | POA: Diagnosis not present

## 2018-07-20 HISTORY — DX: Migraine, unspecified, not intractable, without status migrainosus: G43.909

## 2018-07-20 HISTORY — PX: TOTAL HIP ARTHROPLASTY: SHX124

## 2018-07-20 HISTORY — DX: Family history of other specified conditions: Z84.89

## 2018-07-20 HISTORY — DX: Pure hypercholesterolemia, unspecified: E78.00

## 2018-07-20 HISTORY — DX: Unspecified osteoarthritis, unspecified site: M19.90

## 2018-07-20 SURGERY — ARTHROPLASTY, HIP, TOTAL, ANTERIOR APPROACH
Anesthesia: Spinal | Site: Hip | Laterality: Left

## 2018-07-20 MED ORDER — BUPIVACAINE IN DEXTROSE 0.75-8.25 % IT SOLN
INTRATHECAL | Status: DC | PRN
  Administered 2018-07-20: 1.8 mL via INTRATHECAL

## 2018-07-20 MED ORDER — SODIUM CHLORIDE 0.9 % IV SOLN
INTRAVENOUS | Status: DC | PRN
  Administered 2018-07-20: 15 ug/min via INTRAVENOUS

## 2018-07-20 MED ORDER — INFLUENZA VAC SPLIT HIGH-DOSE 0.5 ML IM SUSY
0.5000 mL | PREFILLED_SYRINGE | INTRAMUSCULAR | Status: AC
  Administered 2018-07-21: 0.5 mL via INTRAMUSCULAR
  Filled 2018-07-20: qty 0.5

## 2018-07-20 MED ORDER — CLINDAMYCIN PHOSPHATE 900 MG/50ML IV SOLN
900.0000 mg | INTRAVENOUS | Status: AC
  Administered 2018-07-20: 900 mg via INTRAVENOUS
  Filled 2018-07-20: qty 50

## 2018-07-20 MED ORDER — PANTOPRAZOLE SODIUM 40 MG PO TBEC
40.0000 mg | DELAYED_RELEASE_TABLET | Freq: Every day | ORAL | Status: DC
  Administered 2018-07-21 – 2018-07-22 (×2): 40 mg via ORAL
  Filled 2018-07-20 (×2): qty 1

## 2018-07-20 MED ORDER — POLYETHYLENE GLYCOL 3350 17 G PO PACK: 17.0000 g | PACK | Freq: Every day | ORAL | Status: DC | PRN

## 2018-07-20 MED ORDER — OXYCODONE HCL 5 MG PO TABS
5.0000 mg | ORAL_TABLET | ORAL | Status: DC | PRN
  Administered 2018-07-22 (×2): 10 mg via ORAL
  Filled 2018-07-20 (×5): qty 2

## 2018-07-20 MED ORDER — SUMATRIPTAN SUCCINATE 100 MG PO TABS
100.0000 mg | ORAL_TABLET | ORAL | Status: DC | PRN
  Filled 2018-07-20: qty 1

## 2018-07-20 MED ORDER — HYDROMORPHONE HCL 1 MG/ML IJ SOLN: 0.2500 mg | INTRAMUSCULAR | Status: DC | PRN

## 2018-07-20 MED ORDER — VERAPAMIL HCL ER 240 MG PO TBCR
240.0000 mg | EXTENDED_RELEASE_TABLET | Freq: Two times a day (BID) | ORAL | Status: DC
  Administered 2018-07-20 – 2018-07-22 (×4): 240 mg via ORAL
  Filled 2018-07-20 (×4): qty 1

## 2018-07-20 MED ORDER — 0.9 % SODIUM CHLORIDE (POUR BTL) OPTIME
TOPICAL | Status: DC | PRN
  Administered 2018-07-20: 1000 mL

## 2018-07-20 MED ORDER — MEPERIDINE HCL 50 MG/ML IJ SOLN: 6.2500 mg | INTRAMUSCULAR | Status: DC | PRN

## 2018-07-20 MED ORDER — PROPOFOL 10 MG/ML IV BOLUS
INTRAVENOUS | Status: DC | PRN
  Administered 2018-07-20: 20 mg via INTRAVENOUS

## 2018-07-20 MED ORDER — ONDANSETRON HCL 4 MG PO TABS: 4.0000 mg | ORAL_TABLET | Freq: Four times a day (QID) | ORAL | Status: DC | PRN

## 2018-07-20 MED ORDER — SODIUM CHLORIDE 0.9 % IV SOLN: INTRAVENOUS | Status: DC

## 2018-07-20 MED ORDER — PHENOL 1.4 % MT LIQD: 1.0000 | OROMUCOSAL | Status: DC | PRN

## 2018-07-20 MED ORDER — ACETAMINOPHEN 325 MG PO TABS
325.0000 mg | ORAL_TABLET | Freq: Four times a day (QID) | ORAL | Status: DC | PRN
  Administered 2018-07-21: 650 mg via ORAL
  Filled 2018-07-20: qty 2

## 2018-07-20 MED ORDER — METHOCARBAMOL 500 MG PO TABS
500.0000 mg | ORAL_TABLET | Freq: Four times a day (QID) | ORAL | Status: DC | PRN
  Administered 2018-07-20 – 2018-07-22 (×4): 500 mg via ORAL
  Filled 2018-07-20 (×5): qty 1

## 2018-07-20 MED ORDER — ATORVASTATIN CALCIUM 10 MG PO TABS
10.0000 mg | ORAL_TABLET | Freq: Every day | ORAL | Status: DC
  Administered 2018-07-21: 10 mg via ORAL
  Filled 2018-07-20: qty 1

## 2018-07-20 MED ORDER — PROPOFOL 10 MG/ML IV BOLUS
INTRAVENOUS | Status: AC
  Filled 2018-07-20: qty 20

## 2018-07-20 MED ORDER — ONDANSETRON HCL 4 MG/2ML IJ SOLN
INTRAMUSCULAR | Status: DC | PRN
  Administered 2018-07-20: 4 mg via INTRAVENOUS

## 2018-07-20 MED ORDER — DEXAMETHASONE SODIUM PHOSPHATE 4 MG/ML IJ SOLN
INTRAMUSCULAR | Status: DC | PRN
  Administered 2018-07-20: 10 mg via INTRAVENOUS

## 2018-07-20 MED ORDER — METOCLOPRAMIDE HCL 5 MG/ML IJ SOLN: 5.0000 mg | Freq: Three times a day (TID) | INTRAMUSCULAR | Status: DC | PRN

## 2018-07-20 MED ORDER — LACTATED RINGERS IV SOLN
INTRAVENOUS | Status: DC
  Administered 2018-07-20: 11:00:00 via INTRAVENOUS

## 2018-07-20 MED ORDER — MENTHOL 3 MG MT LOZG: 1.0000 | LOZENGE | OROMUCOSAL | Status: DC | PRN

## 2018-07-20 MED ORDER — HYDROCHLOROTHIAZIDE 25 MG PO TABS
25.0000 mg | ORAL_TABLET | Freq: Every day | ORAL | Status: DC
  Administered 2018-07-21 – 2018-07-22 (×2): 25 mg via ORAL
  Filled 2018-07-20 (×2): qty 1

## 2018-07-20 MED ORDER — PHENYLEPHRINE 40 MCG/ML (10ML) SYRINGE FOR IV PUSH (FOR BLOOD PRESSURE SUPPORT)
PREFILLED_SYRINGE | INTRAVENOUS | Status: DC | PRN
  Administered 2018-07-20 (×2): 80 ug via INTRAVENOUS
  Administered 2018-07-20: 40 ug via INTRAVENOUS

## 2018-07-20 MED ORDER — OXYCODONE HCL 5 MG PO TABS
10.0000 mg | ORAL_TABLET | ORAL | Status: DC | PRN
  Administered 2018-07-20 – 2018-07-21 (×4): 10 mg via ORAL
  Filled 2018-07-20: qty 2

## 2018-07-20 MED ORDER — CHLORHEXIDINE GLUCONATE 4 % EX LIQD: 60.0000 mL | Freq: Once | CUTANEOUS | Status: DC

## 2018-07-20 MED ORDER — METOCLOPRAMIDE HCL 5 MG/ML IJ SOLN: 10.0000 mg | Freq: Once | INTRAMUSCULAR | Status: DC | PRN

## 2018-07-20 MED ORDER — METOCLOPRAMIDE HCL 5 MG PO TABS: 5.0000 mg | ORAL_TABLET | Freq: Three times a day (TID) | ORAL | Status: DC | PRN

## 2018-07-20 MED ORDER — CYCLOSPORINE 0.05 % OP EMUL
1.0000 [drp] | Freq: Two times a day (BID) | OPHTHALMIC | Status: DC
  Administered 2018-07-20 – 2018-07-22 (×4): 1 [drp] via OPHTHALMIC
  Filled 2018-07-20 (×4): qty 1

## 2018-07-20 MED ORDER — CLINDAMYCIN PHOSPHATE 600 MG/50ML IV SOLN
600.0000 mg | Freq: Four times a day (QID) | INTRAVENOUS | Status: AC
  Administered 2018-07-20 – 2018-07-21 (×2): 600 mg via INTRAVENOUS
  Filled 2018-07-20 (×2): qty 50

## 2018-07-20 MED ORDER — DOCUSATE SODIUM 100 MG PO CAPS
100.0000 mg | ORAL_CAPSULE | Freq: Two times a day (BID) | ORAL | Status: DC
  Administered 2018-07-20 – 2018-07-22 (×4): 100 mg via ORAL
  Filled 2018-07-20 (×4): qty 1

## 2018-07-20 MED ORDER — METHOCARBAMOL 1000 MG/10ML IJ SOLN
500.0000 mg | Freq: Four times a day (QID) | INTRAVENOUS | Status: DC | PRN
  Filled 2018-07-20: qty 5

## 2018-07-20 MED ORDER — MIDAZOLAM HCL 2 MG/2ML IJ SOLN
INTRAMUSCULAR | Status: AC
  Filled 2018-07-20: qty 2

## 2018-07-20 MED ORDER — PROPOFOL 500 MG/50ML IV EMUL
INTRAVENOUS | Status: DC | PRN
  Administered 2018-07-20: 100 ug/kg/min via INTRAVENOUS

## 2018-07-20 MED ORDER — SODIUM CHLORIDE 0.9 % IR SOLN
Status: DC | PRN
  Administered 2018-07-20: 3000 mL

## 2018-07-20 MED ORDER — DIPHENHYDRAMINE HCL 12.5 MG/5ML PO ELIX: 12.5000 mg | ORAL_SOLUTION | ORAL | Status: DC | PRN

## 2018-07-20 MED ORDER — ASPIRIN 81 MG PO CHEW
81.0000 mg | CHEWABLE_TABLET | Freq: Two times a day (BID) | ORAL | Status: DC
  Administered 2018-07-20 – 2018-07-22 (×4): 81 mg via ORAL
  Filled 2018-07-20 (×4): qty 1

## 2018-07-20 MED ORDER — MIDAZOLAM HCL 2 MG/2ML IJ SOLN
INTRAMUSCULAR | Status: DC | PRN
  Administered 2018-07-20: 2 mg via INTRAVENOUS

## 2018-07-20 MED ORDER — ALUM & MAG HYDROXIDE-SIMETH 200-200-20 MG/5ML PO SUSP: 30.0000 mL | ORAL | Status: DC | PRN

## 2018-07-20 MED ORDER — PROPOFOL 1000 MG/100ML IV EMUL
INTRAVENOUS | Status: AC
  Filled 2018-07-20: qty 100

## 2018-07-20 MED ORDER — ONDANSETRON HCL 4 MG/2ML IJ SOLN
INTRAMUSCULAR | Status: AC
  Filled 2018-07-20: qty 2

## 2018-07-20 MED ORDER — HYDROMORPHONE HCL 1 MG/ML IJ SOLN: 0.5000 mg | INTRAMUSCULAR | Status: DC | PRN

## 2018-07-20 MED ORDER — ONDANSETRON HCL 4 MG/2ML IJ SOLN: 4.0000 mg | Freq: Four times a day (QID) | INTRAMUSCULAR | Status: DC | PRN

## 2018-07-20 SURGICAL SUPPLY — 53 items
BENZOIN TINCTURE PRP APPL 2/3 (GAUZE/BANDAGES/DRESSINGS) ×3 IMPLANT
BLADE CLIPPER SURG (BLADE) IMPLANT
BLADE SAW SGTL 18X1.27X75 (BLADE) ×2 IMPLANT
BLADE SAW SGTL 18X1.27X75MM (BLADE) ×1
CLOSURE WOUND 1/2 X4 (GAUZE/BANDAGES/DRESSINGS) ×2
COVER SURGICAL LIGHT HANDLE (MISCELLANEOUS) ×3 IMPLANT
COVER WAND RF STERILE (DRAPES) ×3 IMPLANT
CUP SECTOR GRIPTON 50MM (Cup) ×3 IMPLANT
DRAPE C-ARM 42X72 X-RAY (DRAPES) ×3 IMPLANT
DRAPE STERI IOBAN 125X83 (DRAPES) ×3 IMPLANT
DRAPE U-SHAPE 47X51 STRL (DRAPES) ×9 IMPLANT
DRSG AQUACEL AG ADV 3.5X10 (GAUZE/BANDAGES/DRESSINGS) ×3 IMPLANT
DURAPREP 26ML APPLICATOR (WOUND CARE) ×3 IMPLANT
ELECT BLADE 4.0 EZ CLEAN MEGAD (MISCELLANEOUS) ×3
ELECT BLADE 6.5 EXT (BLADE) ×3 IMPLANT
ELECT REM PT RETURN 9FT ADLT (ELECTROSURGICAL) ×3
ELECTRODE BLDE 4.0 EZ CLN MEGD (MISCELLANEOUS) ×1 IMPLANT
ELECTRODE REM PT RTRN 9FT ADLT (ELECTROSURGICAL) ×1 IMPLANT
FACESHIELD WRAPAROUND (MASK) ×6 IMPLANT
GLOVE BIOGEL PI IND STRL 8 (GLOVE) ×2 IMPLANT
GLOVE BIOGEL PI INDICATOR 8 (GLOVE) ×4
GLOVE ECLIPSE 8.0 STRL XLNG CF (GLOVE) ×6 IMPLANT
GLOVE ORTHO TXT STRL SZ7.5 (GLOVE) ×6 IMPLANT
GOWN STRL REUS W/ TWL LRG LVL3 (GOWN DISPOSABLE) ×2 IMPLANT
GOWN STRL REUS W/ TWL XL LVL3 (GOWN DISPOSABLE) ×2 IMPLANT
GOWN STRL REUS W/TWL LRG LVL3 (GOWN DISPOSABLE) ×4
GOWN STRL REUS W/TWL XL LVL3 (GOWN DISPOSABLE) ×4
HANDPIECE INTERPULSE COAX TIP (DISPOSABLE) ×2
HEAD FEMORAL 32 CERAMIC (Hips) ×3 IMPLANT
KIT BASIN OR (CUSTOM PROCEDURE TRAY) ×3 IMPLANT
KIT TURNOVER KIT B (KITS) ×3 IMPLANT
LINER ACETABULAR 32X50 (Liner) ×3 IMPLANT
MANIFOLD NEPTUNE II (INSTRUMENTS) ×3 IMPLANT
NS IRRIG 1000ML POUR BTL (IV SOLUTION) ×3 IMPLANT
PACK TOTAL JOINT (CUSTOM PROCEDURE TRAY) ×3 IMPLANT
PAD ARMBOARD 7.5X6 YLW CONV (MISCELLANEOUS) ×3 IMPLANT
SET HNDPC FAN SPRY TIP SCT (DISPOSABLE) ×1 IMPLANT
STAPLER VISISTAT 35W (STAPLE) ×3 IMPLANT
STEM CORAIL KA11 (Stem) ×3 IMPLANT
STRIP CLOSURE SKIN 1/2X4 (GAUZE/BANDAGES/DRESSINGS) ×4 IMPLANT
SUT ETHIBOND NAB CT1 #1 30IN (SUTURE) ×3 IMPLANT
SUT MNCRL AB 4-0 PS2 18 (SUTURE) ×6 IMPLANT
SUT VIC AB 0 CT1 27 (SUTURE) ×2
SUT VIC AB 0 CT1 27XBRD ANBCTR (SUTURE) ×1 IMPLANT
SUT VIC AB 1 CT1 27 (SUTURE) ×2
SUT VIC AB 1 CT1 27XBRD ANBCTR (SUTURE) ×1 IMPLANT
SUT VIC AB 2-0 CT1 27 (SUTURE) ×2
SUT VIC AB 2-0 CT1 TAPERPNT 27 (SUTURE) ×1 IMPLANT
TOWEL OR 17X24 6PK STRL BLUE (TOWEL DISPOSABLE) ×3 IMPLANT
TOWEL OR 17X26 10 PK STRL BLUE (TOWEL DISPOSABLE) ×3 IMPLANT
TRAY CATH 16FR W/PLASTIC CATH (SET/KITS/TRAYS/PACK) IMPLANT
TRAY FOLEY MTR SLVR 16FR STAT (SET/KITS/TRAYS/PACK) ×3 IMPLANT
WATER STERILE IRR 1000ML POUR (IV SOLUTION) IMPLANT

## 2018-07-20 NOTE — Brief Op Note (Signed)
07/20/2018  2:43 PM  PATIENT:  Sharon [name]  67 y.o. female  PRE-OPERATIVE DIAGNOSIS:  osteoarthritis left hip  POST-OPERATIVE DIAGNOSIS:  osteoarthritis left hip  PROCEDURE:  Procedure(s): LEFT TOTAL HIP ARTHROPLASTY ANTERIOR APPROACH (Left)  SURGEON:  Surgeon(s) and Role:    Mcarthur Rossetti, MD - Primary  PHYSICIAN ASSISTANT: Benita Stabile, PA-c  ANESTHESIA:   spinal  EBL:  300 mL   COUNTS:  YES  DICTATION: .Other Dictation: Dictation Number [id number]  PLAN OF CARE: Admit to inpatient   PATIENT DISPOSITION:  PACU - hemodynamically stable.   Delay start of Pharmacological VTE agent (>24hrs) due to surgical blood loss or risk of bleeding: no

## 2018-07-20 NOTE — Transfer of Care (Signed)
Immediate Anesthesia Transfer of Care Note  Patient: Sharon [name]  Procedure(s) Performed: LEFT TOTAL HIP ARTHROPLASTY ANTERIOR APPROACH (Left Hip)  Patient Location: PACU  Anesthesia Type:Spinal  Level of Consciousness: drowsy and patient cooperative  Airway & Oxygen Therapy: Patient Spontanous Breathing and Patient connected to face mask oxygen  Post-op Assessment: Report given to RN and Post -op Vital signs reviewed and stable  Post vital signs: Reviewed and stable  Last Vitals:  Vitals Value Taken Time  BP    Temp    Pulse 56 07/20/2018  2:56 PM  Resp 11 07/20/2018  2:56 PM  SpO2 100 % 07/20/2018  2:56 PM  Vitals shown include unvalidated device data.  Last Pain:  Vitals:   07/20/18 1111  TempSrc:   PainSc: 0-No pain      Patients Stated Pain Goal: 2 (72/09/19 8022)  Complications: No apparent anesthesia complications

## 2018-07-20 NOTE — H&P (Signed)
TOTAL HIP ADMISSION H&P  Patient is admitted for left total hip arthroplasty.  Subjective:  Chief Complaint: left hip pain  HPI: Sharon [name], 67 y.o. female, has a history of pain and functional disability in the left hip(s) due to arthritis and patient has failed non-surgical conservative treatments for greater than 12 weeks to include NSAID's and/or analgesics, corticosteriod injections, flexibility and strengthening excercises, use of assistive devices, weight reduction as appropriate and activity modification.  Onset of symptoms was gradual starting 3 years ago with gradually worsening course since that time.The patient noted no past surgery on the left hip(s).  Patient currently rates pain in the left hip at 10 out of 10 with activity. Patient has night pain, worsening of pain with activity and weight bearing, trendelenberg gait, pain that interfers with activities of daily living, pain with passive range of motion and crepitus. Patient has evidence of subchondral cysts, subchondral sclerosis, periarticular osteophytes and joint space narrowing by imaging studies. This condition presents safety issues increasing the risk of falls.  There is no current active infection.  Patient Active Problem List   Diagnosis Date Noted  . Unilateral primary osteoarthritis, left hip 05/25/2018  . Genetic testing 11/14/2016  . Family history of ovarian cancer   . Family history of colon cancer   . Family history of uterine cancer   . Family history of stomach cancer   . Osteoarthritis of left knee 04/03/2015  . S/P total knee replacement using cement 04/03/2015  . Gait abnormality 12/22/2012  . Swelling of ankle 12/22/2012  . Ringworm   . Hypertension   . Fibroid   . Headache(784.0)   . Arthritis   . Vitamin D deficiency    Past Medical History:  Diagnosis Date  . Arthritis    Osteoarthritis  . Family history of colon cancer   . Family history of ovarian cancer   . Family history of  stomach cancer   . Family history of uterine cancer   . Fibroid   . Headache(784.0)   . Heart murmur   . Hypertension   . Ringworm   . Vitamin D deficiency     Past Surgical History:  Procedure Laterality Date  . ABDOMINAL HYSTERECTOMY  1990   TAH BSO  . APPENDECTOMY  1969  . FOOT SURGERY     bunionectomy  . KNEE SURGERY  11/2013   arthroscopic L knee  . OOPHORECTOMY     BSO  . Precancerous lesion excised    . TONSILLECTOMY    . TOTAL KNEE ARTHROPLASTY  2012  . TOTAL KNEE ARTHROPLASTY Left 04/03/2015   Procedure: TOTAL KNEE ARTHROPLASTY;  Surgeon: Garald Balding, MD;  Location: [place];  Service: Orthopedics;  Laterality: Left;    Current Facility-Administered Medications  Medication Dose Route Frequency Provider Last Rate Last Dose  . chlorhexidine (HIBICLENS) 4 % liquid 4 application  60 mL Topical Once Erskine Emery W, PA-C      . clindamycin (CLEOCIN) IVPB 900 mg  900 mg Intravenous On Call to OR Pete Pelt, PA-C      . lactated ringers infusion   Intravenous Continuous Josephine Igo, MD      . tranexamic acid (CYKLOKAPRON) 1,000 mg in sodium chloride 0.9 % 100 mL IVPB  1,000 mg Intravenous To OR Mcarthur Rossetti, MD       Allergies  Allergen Reactions  . Adhesive [Tape] Other (See Comments)    Blisters  . Cefuroxime Axetil     REACTION:  rash  . Codeine Other (See Comments)    headache  . Ceftin Rash    Social History   Tobacco Use  . Smoking status: Never Smoker  . Smokeless tobacco: Never Used  Substance Use Topics  . Alcohol use: Yes    Alcohol/week: 3.0 standard drinks    Types: 3 Standard drinks or equivalent per week    Family History  Problem Relation Age of Onset  . Ovarian cancer Mother 57  . Heart disease Maternal Grandfather   . Hypertension Paternal Grandmother   . Ovarian cancer Cousin        maternal first cousin dx in her 72s  . Heart disease Father   . Atrial fibrillation Father   . Heart disease Brother   . Heart  disease Brother   . Colon cancer Maternal Aunt        dx in her 10s-60  . Diabetes Paternal Aunt   . Ovarian cancer Maternal Grandmother        dx in her mid 57s  . Cancer Maternal Grandmother        OVARIAN AND UTERINE  . Uterine cancer Maternal Grandmother        dx in her early 53s  . Stomach cancer Maternal Uncle        dx in his 51s-80s  . Stroke Maternal Uncle   . Leukemia Paternal Uncle      Review of Systems  Musculoskeletal: Positive for joint pain.  All other systems reviewed and are negative.   Objective:  Physical Exam  Constitutional: She is oriented to person, place, and time. She appears well-developed and well-nourished.  HENT:  Head: Normocephalic and atraumatic.  Eyes: Pupils are equal, round, and reactive to light. EOM are normal.  Neck: Normal range of motion. Neck supple.  Cardiovascular: Normal rate.  Respiratory: Effort normal.  GI: Soft.  Musculoskeletal:       Left hip: She exhibits decreased range of motion, decreased strength, tenderness and bony tenderness.  Neurological: She is alert and oriented to person, place, and time.  Skin: Skin is warm and dry.  Psychiatric: She has a normal mood and affect.    Vital signs in last 24 hours: Temp:  [98.2 F (36.8 C)] 98.2 F (36.8 C) (10/01 1107) Pulse Rate:  [69] 69 (10/01 1107) Resp:  [20] 20 (10/01 1107) BP: (156)/(93) 156/93 (10/01 1107) SpO2:  [97 %] 97 % (10/01 1107)  Labs:   Estimated body mass index is 28.07 kg/m as calculated from the following:   Height as of 07/14/18: 5\' 7"  (1.702 m).   Weight as of 07/14/18: 81.3 kg.   Imaging Review Plain radiographs demonstrate severe degenerative joint disease of the left hip(s). The bone quality appears to be good for age and reported activity level.    Preoperative templating of the joint replacement has been completed, documented, and submitted to the Operating Room personnel in order to optimize intra-operative equipment  management.     Assessment/Plan:  End stage arthritis, left hip(s)  The patient history, physical examination, clinical judgement of the provider and imaging studies are consistent with end stage degenerative joint disease of the left hip(s) and total hip arthroplasty is deemed medically necessary. The treatment options including medical management, injection therapy, arthroscopy and arthroplasty were discussed at length. The risks and benefits of total hip arthroplasty were presented and reviewed. The risks due to aseptic loosening, infection, stiffness, dislocation/subluxation,  thromboembolic complications and other imponderables were discussed.  The patient  acknowledged the explanation, agreed to proceed with the plan and consent was signed. Patient is being admitted for inpatient treatment for surgery, pain control, PT, OT, prophylactic antibiotics, VTE prophylaxis, progressive ambulation and ADL's and discharge planning.The patient is planning to be discharged home with home health services

## 2018-07-20 NOTE — Anesthesia Procedure Notes (Signed)
Procedure Name: MAC Date/Time: 07/20/2018 1:09 PM Performed by: Genelle Bal, CRNA Pre-anesthesia Checklist: Patient identified, Emergency Drugs available, Suction available and Timeout performed Patient Re-evaluated:Patient Re-evaluated prior to induction Oxygen Delivery Method: Simple face mask Placement Confirmation: positive ETCO2 Dental Injury: Teeth and Oropharynx as per pre-operative assessment

## 2018-07-20 NOTE — Anesthesia Preprocedure Evaluation (Addendum)
Anesthesia Evaluation  Patient identified by MRN, date of birth, ID band Patient awake    Reviewed: Allergy & Precautions, NPO status , Patient's Chart, lab work & pertinent test results  Airway Mallampati: II  TM Distance: >3 FB Neck ROM: Full    Dental no notable dental hx. (+) Teeth Intact   Pulmonary neg pulmonary ROS,    Pulmonary exam normal breath sounds clear to auscultation       Cardiovascular hypertension, Pt. on medications Normal cardiovascular exam+ Valvular Problems/Murmurs AI  Rhythm:Regular Rate:Normal  Echo 6/25 2019: Left ventricle: The cavity size was normal. Systolic function was normal. The estimated ejection fraction was in the range of 60% to 65%. Wall motion was normal; there were no regional wall motion abnormalities. - Aortic valve: Transvalvular velocity was within the normal range. There was no stenosis. There was moderate regurgitation.   Regurgitation pressure half-time: 405 ms. - Mitral valve: Transvalvular velocity was within the normal range.   There was no evidence for stenosis. There was mild regurgitation. - Right ventricle: The cavity size was normal. Wall thickness was normal. Systolic function was normal. - Atrial septum: No defect or patent foramen ovale was identified by color flow Doppler. - Tricuspid valve: There was mild regurgitation. - Pulmonary arteries: Systolic pressure was within the normal range. PA peak pressure: 32 mm Hg (S).  EKG: NSR with 1st degree AV Block   Neuro/Psych  Headaches,    GI/Hepatic negative GI ROS, Neg liver ROS,   Endo/Other  negative endocrine ROS  Renal/GU negative Renal ROS  negative genitourinary   Musculoskeletal  (+) Arthritis , Osteoarthritis,  OA left hip   Abdominal   Peds  Hematology negative hematology ROS (+)   Anesthesia Other Findings   Reproductive/Obstetrics                             Anesthesia  Physical Anesthesia Plan  ASA: II  Anesthesia Plan: Spinal   Post-op Pain Management:    Induction:   PONV Risk Score and Plan: 3 and Midazolam, Ondansetron, Dexamethasone and Treatment may vary due to age or medical condition  Airway Management Planned: Natural Airway and Simple Face Mask  Additional Equipment:   Intra-op Plan:   Post-operative Plan:   Informed Consent: I have reviewed the patients History and Physical, chart, labs and discussed the procedure including the risks, benefits and alternatives for the proposed anesthesia with the patient or authorized representative who has indicated his/her understanding and acceptance.   Dental advisory given  Plan Discussed with: CRNA and Surgeon  Anesthesia Plan Comments:         Anesthesia Quick Evaluation

## 2018-07-20 NOTE — Anesthesia Procedure Notes (Signed)
Spinal  Patient location during procedure: OR Start time: 07/20/2018 1:17 PM End time: 07/20/2018 1:22 PM Staffing Anesthesiologist: Lynda Rainwater, MD Performed: anesthesiologist  Preanesthetic Checklist Completed: patient identified, site marked, surgical consent, pre-op evaluation, timeout performed, IV checked, risks and benefits discussed and monitors and equipment checked Spinal Block Patient position: sitting Prep: ChloraPrep Patient monitoring: heart rate, continuous pulse ox and blood pressure Approach: midline Location: L3-4 Injection technique: single-shot Needle Needle type: Pencan  Needle gauge: 24 G Needle length: 9 cm

## 2018-07-20 NOTE — Op Note (Signed)
NAME: Sharon [name], [name] MEDICAL RECORD [id number] ACCOUNT [id number] DATE OF BIRTH:1951/02/21 FACILITY: [facility] LOCATION: [facility] PHYSICIAN:Sarea Fyfe Kerry Fort, MD  OPERATIVE REPORT  DATE OF PROCEDURE:  07/20/2018  PREOPERATIVE DIAGNOSIS:  Primary osteoarthritis and degenerative joint disease, left hip.  POSTOPERATIVE DIAGNOSIS:  Primary osteoarthritis and degenerative joint disease, left hip.  PROCEDURE:  Left total hip arthroplasty through direct anterior approach.  IMPLANTS:  De[employer] Sector Gription acetabular component size 50, size 32+0 polyethylene liner, size 11 Corail femoral component with standard offset, size 32+1 ceramic hip ball.  SURGEON:  Lind Guest. Ninfa Linden, MD  ASSISTANT:  Erskine Emery, PA-C.  ANESTHESIA:  Spinal.  ANTIBIOTICS:  900 mg IV clindamycin.  ESTIMATED BLOOD LOSS:  902 mL  COMPLICATIONS:  None.  INDICATIONS:  The patient is a very pleasant 67 year old female with known debilitating arthritis involving her left hip.  She has tried and failed all forms of conservative treatment.  Her pain has become daily and her left hip arthritis has  detrimentally affected activities of daily living, quality of life and her mobility.  At this point, she does wish to proceed with a total hip arthroplasty.  We talked in detail about the surgery including a discussion of the risks and benefits including  the risk of acute blood loss anemia, nerve or vessel injury, fracture, infection, dislocation, DVT and implant failure.  She understands our goals are to decrease pain, improve mobility and overall improve quality of life.  DESCRIPTION OF PROCEDURE:  After informed consent was obtained and appropriate left hip was marked.  She was brought to the operating room where spinal anesthesia was obtained while she was on her stretcher.  She was then laid in the supine position on a  stretcher.  Foley catheter was placed.  We assessed her leg lengths with her supine and  found it to be equal.  We then placed traction boots on both her feet and placed her supine on the Hana fracture table with a perineal post in place and both legs in  line skeletal traction device and no traction applied.  We then had her left hip prepped and draped with DuraPrep and sterile drapes.  Time-out was called to identify correct patient, correct left hip.  We then made an incision just inferior and  posterior to the anterior superior iliac spine and carried this obliquely down the leg.  We dissected down tensor fascia lata muscle.  Tensor fascia was then divided longitudinally to proceed with direct anterior approach to the hip.  We identified and  cauterized circumflex vessels and identified the hip capsule.  We entered the hip capsule in an L-type format, finding moderate joint effusion and significant arthritis around her left hip.  We placed Cobra retractors around the medial and lateral  femoral neck and then made our femoral neck cut just proximal to the lesser trochanter with an oscillating saw and completed this with an osteotome.  We placed a corkscrew guide in the femoral head and removed the femoral head in its entirety and found a  large area devoid of cartilage completely.  We then placed a bent Hohmann over the medial acetabular rim and removed remnants of the acetabular labrum and other debris.  We then began reaming from a size 43 reamer in stepwise increments up to a size 49  with all reamers under direct visualization, the last reamer under direct fluoroscopy, so we could obtain our depth of reaming our inclination and anteversion.  I then placed the real [employer]Star  Gription acetabular component size 50 and a 32+0 neutral  polyethylene liner for that size acetabular component.  Attention was then turned to the femur.  With the leg externally rotated to 120 degrees, extended and adducted we were able to place a Mueller retractor medially and a Hohmann retractor behind the   greater trochanter, released lateral joint capsule and used a box-cutting osteotome to enter femoral canal and a rongeur to lateralize.  I then began broaching using a size 8 Corail broaching stem going from an 8 to an 11.  With the 11 in place, we  trialed a standard offset femoral neck and a 32+1 trial hip ball.  We brought the leg back over and up with traction and internal rotation, reducing the pelvis.  When we assessed radiographically were pleased with leg length and range of motion on clinical exam.  She was stable with internal and external rotation with minimal shuck.  We then dislocated the hip and removed the trial components.  We placed the real  Corail femoral component size 11 with standard offset and the real 32+1 ceramic hip ball again reduced this in the acetabulum.  We were pleased with stability.  We then irrigated the soft tissue with normal saline solution using pulsatile lavage.  I was  able to close the joint capsule with interrupted #1 Ethibond suture, followed by running 0 Vicryl and tensor fascia, 0 Vicryl in the deep tissue, 2-0 Vicryl subcutaneous tissue and interrupted staples on the skin.  Xeroform and Aquacel dressing was  applied.  She was taken off the Hana table and taken to recovery room in stable condition.  All final counts were correct.  There were no complications noted.  Of note, Benita Stabile, PA-C, assisted the entire case and assistance was crucial for facilitating  all aspects of this case.  TN/NUANCE  D:07/20/2018 T:07/20/2018 JOB:[id number]

## 2018-07-20 NOTE — Anesthesia Postprocedure Evaluation (Signed)
Anesthesia Post Note  Patient: Teagan Ozawa Sereno  Procedure(s) Performed: LEFT TOTAL HIP ARTHROPLASTY ANTERIOR APPROACH (Left Hip)     Patient location during evaluation: PACU Anesthesia Type: Spinal Level of consciousness: oriented and awake and alert Pain management: pain level controlled Vital Signs Assessment: post-procedure vital signs reviewed and stable Respiratory status: spontaneous breathing, respiratory function stable, patient connected to nasal cannula oxygen and nonlabored ventilation Cardiovascular status: blood pressure returned to baseline and stable Postop Assessment: no headache, no backache, no apparent nausea or vomiting and spinal receding Anesthetic complications: no    Last Vitals:  Vitals:   07/20/18 1505 07/20/18 1510  BP:  99/64  Pulse: (!) 56 68  Resp: 10 15  Temp:    SpO2: 100% 100%    Last Pain:  Vitals:   07/20/18 1510  TempSrc:   PainSc: 0-No pain                 Jewett Mcgann A.

## 2018-07-21 ENCOUNTER — Encounter (HOSPITAL_COMMUNITY): Payer: Self-pay | Admitting: Orthopaedic Surgery

## 2018-07-21 LAB — CBC
HEMATOCRIT: 31.5 % — AB (ref 36.0–46.0)
Hemoglobin: 10.7 g/dL — ABNORMAL LOW (ref 12.0–15.0)
MCH: 31.9 pg (ref 26.0–34.0)
MCHC: 34 g/dL (ref 30.0–36.0)
MCV: 94 fL (ref 78.0–100.0)
Platelets: 264 10*3/uL (ref 150–400)
RBC: 3.35 MIL/uL — AB (ref 3.87–5.11)
RDW: 11.8 % (ref 11.5–15.5)
WBC: 13.2 10*3/uL — ABNORMAL HIGH (ref 4.0–10.5)

## 2018-07-21 LAB — BASIC METABOLIC PANEL
Anion gap: 9 (ref 5–15)
BUN: 17 mg/dL (ref 8–23)
CHLORIDE: 102 mmol/L (ref 98–111)
CO2: 23 mmol/L (ref 22–32)
CREATININE: 0.67 mg/dL (ref 0.44–1.00)
Calcium: 9 mg/dL (ref 8.9–10.3)
GFR calc Af Amer: >60 mL/min (ref >60)
GFR calc non Af Amer: >60 mL/min (ref >60)
GLUCOSE: 152 mg/dL — AB (ref 70–99)
Potassium: 3.9 mmol/L (ref 3.5–5.1)
SODIUM: 134 mmol/L — AB (ref 135–145)

## 2018-07-21 NOTE — Evaluation (Signed)
Physical Therapy Evaluation Patient Details Name: Sharon [name] MRN: [id number] DOB: [birth year] Today's Date: 07/21/2018   History of Present Illness  Pt is 67 y/o female s/p L THA, anterior approach. Previous bil TKA. PMHx: HTN, HA, OA.   Clinical Impression  Pt pleasant on arrival, pleased with how well she is moving and with decreased pain in L knee. Pt educated on WBAT with no further precautions. Pt educated and demonstrated HEP with handout provided. Pt bed mobility, transfers, and ambulation of 176ft with RW min guard for safety. Pt with generalized weakness and decreased balance (see PT problem list below for all). Pt will benefit acutely from skilled therapy in order to maximize functional mobility, independence and to decrease fall risk.     Follow Up Recommendations Home health PT    Equipment Recommendations  None recommended by PT    Recommendations for Other Services       Precautions / Restrictions Precautions Precautions: None((direct anterior hip)) Restrictions Weight Bearing Restrictions: Yes LLE Weight Bearing: Weight bearing as tolerated(L Hip )      Mobility  Bed Mobility Overal bed mobility: Modified Independent             General bed mobility comments: increased time  Transfers Overall transfer level: Needs assistance Equipment used: Rolling walker (2 wheeled) Transfers: Sit to/from Stand Sit to Stand: Min guard         General transfer comment: on trial, two attempts to rise, momentum to power up. min guard for safety and steady   Ambulation/Gait Ambulation/Gait assistance: Min guard Gait Distance (Feet): 120 Feet Assistive device: Rolling walker (2 wheeled) Gait Pattern/deviations: Step-to pattern;Step-through pattern;Decreased stride length Gait velocity: slowed  Gait velocity interpretation: <1.8 ft/sec, indicate of risk for recurrent falls General Gait Details: WBAT with RW. step to pattern, able to progress to step through  pattern at end of session. great RW technique with cues to progress stride length. min guard for safety. heavy reliance on bil UE to offweight LLE.   Stairs            Wheelchair Mobility    Modified Rankin (Stroke Patients Only)       Balance Overall balance assessment: Modified Independent(bil UE support in standing)                                           Pertinent Vitals/Pain Pain Assessment: 0-10 Pain Descriptors / Indicators: Discomfort;Sore Pain Intervention(s): Limited activity within patient's tolerance;Monitored during session;Repositioned    Home Living Family/patient expects to be discharged to:: Private residence Living Arrangements: Spouse/significant other;Children(2 sons staying through week. ) Available Help at Discharge: Family;Available 24 hours/day Type of Home: House Home Access: Stairs to enter Entrance Stairs-Rails: (ledge can hold onto ) Entrance Stairs-Number of Steps: 1 Home Layout: Two level;Able to live on main level with bedroom/bathroom(walk-in shower upstairs ) Home Equipment: Gilford Rile - 2 wheels;Cane - single point;Bedside commode      Prior Function Level of Independence: Independent         Comments: use of cane on "bad days" normally no AD. likes to garden.      Hand Dominance        Extremity/Trunk Assessment   Upper Extremity Assessment Upper Extremity Assessment: Defer to OT evaluation    Lower Extremity Assessment Lower Extremity Assessment: Generalized weakness;Overall [facility] for tasks assessed    Cervical / Trunk  Assessment Cervical / Trunk Assessment: Normal  Communication   Communication: No difficulties  Cognition Arousal/Alertness: Awake/alert Behavior During Therapy: WFL for tasks assessed/performed Overall Cognitive Status: Within Functional Limits for tasks assessed                                        General Comments      Exercises Total Joint Exercises Ankle  Circles/Pumps: AROM;Both;10 reps;Supine Quad Sets: AROM;Left;5 reps;Supine Short Arc Quad: AROM;Left;10 reps;Supine Heel Slides: AROM;Supine;Left;10 reps Hip ABduction/ADduction: AROM;Supine;Left;10 reps Long Arc Quad: AROM;Left;10 reps;Seated   Assessment/Plan    PT Assessment Patient needs continued PT services  PT Problem List Decreased strength;Decreased range of motion;Decreased knowledge of use of DME;Decreased activity tolerance;Decreased balance;Decreased mobility       PT Treatment Interventions DME instruction;Balance training;Gait training;Stair training;Functional mobility training;Patient/family education;Therapeutic activities;Therapeutic exercise    PT Goals (Current goals can be found in the Care Plan section)  Acute Rehab PT Goals Patient Stated Goal: return home, spend time with sons.  PT Goal Formulation: With patient Time For Goal Achievement: 08/04/18 Potential to Achieve Goals: Good    Frequency 7X/week   Barriers to discharge        Co-evaluation               AM-PAC PT "6 Clicks" Daily Activity  Outcome Measure Difficulty turning over in bed (including adjusting bedclothes, sheets and blankets)?: A Little Difficulty moving from lying on back to sitting on the side of the bed? : A Lot Difficulty sitting down on and standing up from a chair with arms (e.g., wheelchair, bedside commode, etc,.)?: A Lot Help needed moving to and from a bed to chair (including a wheelchair)?: A Little Help needed walking in hospital room?: A Little Help needed climbing 3-5 steps with a railing? : A Lot 6 Click Score: 15    End of Session Equipment Utilized During Treatment: Gait belt Activity Tolerance: Patient tolerated treatment well Patient left: in chair;with call bell/phone within reach Nurse Communication: Mobility status PT Visit Diagnosis: Other abnormalities of gait and mobility (R26.89);Unsteadiness on feet (R26.81);Muscle weakness (generalized)  (M62.81)    Time: 1572-6203 PT Time Calculation (min) (ACUTE ONLY): 36 min   Charges:   PT Evaluation $PT Eval Moderate Complexity: 1 Mod PT Treatments $Gait Training: 8-22 mins        Samuella Bruin, [place]  Acute Rehab [phone]   Samuella Bruin 07/21/2018, 10:19 AM

## 2018-07-21 NOTE — Evaluation (Signed)
Occupational Therapy Evaluation Patient Details Name: Sharon [name] MRN: [id number] DOB: Feb 21, 1951 Today's Date: 07/21/2018    History of Present Illness Pt is 67 y/o female s/p L THA, anterior approach. Previous bil TKA. PMHx: HTN, HA, OA.    Clinical Impression   PTA, pt was living with her husband and was independent. Currently, pt requires supervision for ADLs and functional mobility using RW. Provided education on LB ADLs, toilet transfer, and shower transfer with 3N1; pt demonstrated understanding. Answered all pt questions. Recommend dc home once medically stable per physician. All acute OT needs met and will sign off. Thank you.     Follow Up Recommendations  No OT follow up;Supervision/Assistance - 24 hour    Equipment Recommendations  None recommended by OT    Recommendations for Other Services PT consult     Precautions / Restrictions Precautions Precautions: None((direct anterior hip)) Restrictions Weight Bearing Restrictions: Yes LLE Weight Bearing: Weight bearing as tolerated(L Hip )      Mobility Bed Mobility Overal bed mobility: Modified Independent             General bed mobility comments: increased time  Transfers Overall transfer level: Needs assistance Equipment used: Rolling walker (2 wheeled) Transfers: Sit to/from Stand Sit to Stand: Supervision         General transfer comment: supervision for safety    Balance Overall balance assessment: Modified Independent(bil UE support in standing)                                         ADL either performed or assessed with clinical judgement   ADL Overall ADL's : Needs assistance/impaired                                       General ADL Comments: Providing pt with education on LB ADLs, toileting, and shower transfer with 3N1. Pt demonstrating understanding and performing ADLs and fucntional mobility with supervision. Answered all pt questions      Vision         Perception     Praxis      Pertinent Vitals/Pain Pain Assessment: Faces Faces Pain Scale: Hurts a little bit Pain Location: Left hip Pain Descriptors / Indicators: Discomfort;Sore Pain Intervention(s): Monitored during session;Repositioned     Hand Dominance     Extremity/Trunk Assessment Upper Extremity Assessment Upper Extremity Assessment: Overall WFL for tasks assessed   Lower Extremity Assessment Lower Extremity Assessment: Defer to PT evaluation   Cervical / Trunk Assessment Cervical / Trunk Assessment: Normal   Communication Communication Communication: No difficulties   Cognition Arousal/Alertness: Awake/alert Behavior During Therapy: WFL for tasks assessed/performed Overall Cognitive Status: Within Functional Limits for tasks assessed                                     General Comments       Exercises Exercises: Total Joint Total Joint Exercises Ankle Circles/Pumps: AROM;Both;10 reps;Supine Quad Sets: AROM;Left;5 reps;Supine Short Arc Quad: AROM;Left;10 reps;Supine Heel Slides: AROM;Supine;Left;10 reps Hip ABduction/ADduction: AROM;Supine;Left;10 reps Long Arc Quad: AROM;Left;10 reps;Seated   Shoulder Instructions      Home Living Family/patient expects to be discharged to:: Private residence Living Arrangements: Spouse/significant other;Children(2 sons staying through week. )  Available Help at Discharge: Family;Available 24 hours/day Type of Home: House Home Access: Stairs to enter [employer] of Steps: 1 Entrance Stairs-Rails: (ledge can hold onto ) Home Layout: Two level;Bed/bath upstairs(Able to stay on first floor) Alternate Level Stairs-Number of Steps: 12  Alternate Level Stairs-Rails: Right Bathroom Shower/Tub: Walk-in shower(shower on 2nd floor )   Bathroom Toilet: Standard Bathroom Accessibility: Yes   Home Equipment: Environmental consultant - 2 wheels;Cane - single point;Bedside commode           Prior Functioning/Environment Level of Independence: Independent        Comments: use of cane on "bad days" normally no AD. likes to garden.         OT Problem List: Decreased range of motion;Decreased activity tolerance;Impaired balance (sitting and/or standing);Decreased knowledge of use of DME or AE;Decreased knowledge of precautions;Pain      OT Treatment/Interventions:      OT Goals(Current goals can be found in the care plan section) Acute Rehab OT Goals Patient Stated Goal: return home, spend time with sons.  OT Goal Formulation: All assessment and education complete, DC therapy  OT Frequency:     Barriers to D/C:            Co-evaluation              AM-PAC PT "6 Clicks" Daily Activity     Outcome Measure Help from another person eating meals?: None Help from another person taking care of personal grooming?: None Help from another person toileting, which includes using toliet, bedpan, or urinal?: A Little Help from another person bathing (including washing, rinsing, drying)?: A Little Help from another person to put on and taking off regular upper body clothing?: None Help from another person to put on and taking off regular lower body clothing?: A Little 6 Click Score: 21   End of Session Equipment Utilized During Treatment: Rolling walker Nurse Communication: Mobility status;Precautions  Activity Tolerance: Patient tolerated treatment well Patient left: in bed;with call bell/phone within reach  OT Visit Diagnosis: Unsteadiness on feet (R26.81);Other abnormalities of gait and mobility (R26.89);Muscle weakness (generalized) (M62.81);Pain Pain - Right/Left: Left Pain - part of body: Hip                Time: 7530-0511 OT Time Calculation (min): 18 min Charges:  OT General Charges $OT Visit: 1 Visit OT Evaluation $OT Eval Low Complexity: 1 Low  Ayvah Caroll MSOT, OTR/L Acute Rehab Pager: [phone] Office: [place] 07/21/2018, 12:45 PM

## 2018-07-21 NOTE — Progress Notes (Signed)
Subjective: 1 Day Post-Op Procedure(s) (LRB): LEFT TOTAL HIP ARTHROPLASTY ANTERIOR APPROACH (Left) Patient reports pain as moderate.  Very slight post-operative acute blood loss anemia, but tolerating well.  Objective: Vital signs in last 24 hours: Temp:  [97 F (36.1 C)-98.7 F (37.1 C)] 98.6 F (37 C) (10/02 0411) Pulse Rate:  [56-78] 61 (10/02 0411) Resp:  [10-20] 18 (10/02 0411) BP: (91-156)/(50-93) 110/68 (10/02 0411) SpO2:  [94 %-100 %] 97 % (10/02 0411) Weight:  [81.3 kg] 81.3 kg (10/01 1859)  Intake/Output from previous day: 10/01 0701 - 10/02 0700 In: 1480 [P.O.:480; I.V.:1000] Out: 1840 [Urine:1540; Blood:300] Intake/Output this shift: No intake/output data recorded.  Recent Labs    07/21/18 0626  HGB 10.7*   Recent Labs    07/21/18 0626  WBC 13.2*  RBC 3.35*  HCT 31.5*  PLT 264   Recent Labs    07/21/18 0626  NA 134*  K 3.9  CL 102  CO2 23  BUN 17  CREATININE 0.67  GLUCOSE 152*  CALCIUM 9.0   No results for input(s): LABPT, INR in the last 72 hours.  Sensation intact distally Intact pulses distally Dorsiflexion/Plantar flexion intact Incision: dressing C/D/I   Assessment/Plan: 1 Day Post-Op Procedure(s) (LRB): LEFT TOTAL HIP ARTHROPLASTY ANTERIOR APPROACH (Left) Up with therapy    Sharon [name] 07/21/2018, 8:07 AM

## 2018-07-21 NOTE — Progress Notes (Signed)
Physical Therapy Treatment Patient Details Name: Sharon [name] MRN: [id number] DOB: 04-23-1951 Today's Date: 07/21/2018    History of Present Illness Pt is 67 y/o female s/p L THA, anterior approach. Previous bil TKA. PMHx: HTN, HA, OA.     PT Comments    Pt visiting with son and husband on arrival, willing to participate in therapy. Pt nicely progressing ambulation to 250 ft min guard with no increase in pain. Pt HEP reviewed and performed with instruction to continue 2-3x a day. Will continue to follow.       Follow Up Recommendations  Home health PT     Equipment Recommendations  None recommended by PT    Recommendations for Other Services       Precautions / Restrictions Precautions Precautions: None((direct anterior hip) ) Restrictions Weight Bearing Restrictions: Yes LLE Weight Bearing: Weight bearing as tolerated    Mobility  Bed Mobility Overal bed mobility: Modified Independent             General bed mobility comments: increased time, HOB elevated (able to do without elevation)   Transfers Overall transfer level: Needs assistance Equipment used: Rolling walker (2 wheeled) Transfers: Sit to/from Stand Sit to Stand: Supervision         General transfer comment: supervision for safety, momemtum to power up   Ambulation/Gait Ambulation/Gait assistance: Min guard Gait Distance (Feet): 250 Feet Assistive device: Rolling walker (2 wheeled) Gait Pattern/deviations: Step-through pattern;Decreased stride length Gait velocity: slowed  Gait velocity interpretation: 1.31 - 2.62 ft/sec, indicative of limited community ambulator General Gait Details: Better stride length and step through pattern this afternoon with increase in WB tolerance. no cues required, min guard for safety    Stairs             Wheelchair Mobility    Modified Rankin (Stroke Patients Only)       Balance Overall balance assessment: Modified Independent(bil UE support to  stand )                                          Cognition Arousal/Alertness: Awake/alert Behavior During Therapy: WFL for tasks assessed/performed Overall Cognitive Status: Within Functional Limits for tasks assessed                                        Exercises Total Joint Exercises Ankle Circles/Pumps: AROM;Both;10 reps;Supine Quad Sets: AROM;Left;Supine;5 reps(5 second holds ) Short Arc Quad: AROM;Left;10 reps;Supine Heel Slides: AROM;Supine;Left;10 reps Hip ABduction/ADduction: AROM;Supine;Left;10 reps    General Comments        Pertinent Vitals/Pain Pain Assessment: 0-10 Pain Score: 3  Faces Pain Scale: Hurts a little bit Pain Location: Left hip, no increase in pain with movement  Pain Descriptors / Indicators: Discomfort;Sore Pain Intervention(s): Limited activity within patient's tolerance;Repositioned;Monitored during session;Ice applied    Home Living Family/patient expects to be discharged to:: Private residence Living Arrangements: Spouse/significant other;Children(2 sons staying through week. ) Available Help at Discharge: Family;Available 24 hours/day Type of Home: House Home Access: Stairs to enter Entrance Stairs-Rails: (ledge can hold onto ) Home Layout: Two level;Bed/bath upstairs(Able to stay on first floor) Home Equipment: Walker - 2 wheels;Cane - single point;Bedside commode      Prior Function Level of Independence: Independent      Comments: use of  cane on "bad days" normally no AD. likes to garden.    PT Goals (current goals can now be found in the care plan section) Acute Rehab PT Goals Patient Stated Goal: return home, spend time with sons.  PT Goal Formulation: With patient Time For Goal Achievement: 08/04/18 Potential to Achieve Goals: Good Progress towards PT goals: Progressing toward goals    Frequency    7X/week      PT Plan Current plan remains appropriate    Co-evaluation               AM-PAC PT "6 Clicks" Daily Activity  Outcome Measure  Difficulty turning over in bed (including adjusting bedclothes, sheets and blankets)?: A Little Difficulty moving from lying on back to sitting on the side of the bed? : A Little Difficulty sitting down on and standing up from a chair with arms (e.g., wheelchair, bedside commode, etc,.)?: A Lot Help needed moving to and from a bed to chair (including a wheelchair)?: A Little Help needed walking in hospital room?: A Little Help needed climbing 3-5 steps with a railing? : A Little 6 Click Score: 17    End of Session Equipment Utilized During Treatment: Gait belt Activity Tolerance: Patient tolerated treatment well Patient left: with call bell/phone within reach;in bed;with family/visitor present Nurse Communication: Mobility status PT Visit Diagnosis: Other abnormalities of gait and mobility (R26.89);Unsteadiness on feet (R26.81);Muscle weakness (generalized) (M62.81)     Time: 1321-1340 PT Time Calculation (min) (ACUTE ONLY): 19 min  Charges:  $Gait Training: 8-22 mins                     Samuella Bruin, [place]  Acute Rehab [phone]    Samuella Bruin 07/21/2018, 3:10 PM

## 2018-07-21 NOTE — Care Management Note (Signed)
Case Management Note  Patient Details  Name: DEVLYNN KNOFF MRN: [id number] Date of Birth: Jun 17, 1951  Subjective/Objective:    Pt is 67 y/o female s/p L THA, anterior approach.                Action/Plan: Patient was preoperatively setup with [facility], no changes. She will have suppport of her husband and family at discharge.   Expected Discharge Date:   07/21/18               Expected Discharge Plan:  [place]  In-House Referral:  NA  Discharge planning Services  CM Consult  Post Acute Care Choice:  Home Health Choice offered to:  Patient  DME Arranged:  N/A(has RW and 3in1) DME Agency:  NA  HH Arranged:  PT [place] Agency:  [facility] (formerly [employer])  Status of Service:  Completed, signed off  If discussed at [employer], dates discussed:    Additional Comments:  Ninfa Meeker, RN 07/21/2018, 1:36 PM

## 2018-07-22 MED ORDER — OXYCODONE HCL 5 MG PO TABS: 5.0000 mg | ORAL_TABLET | ORAL | 0 refills | Status: DC | PRN

## 2018-07-22 MED ORDER — METHOCARBAMOL 500 MG PO TABS: 500.0000 mg | ORAL_TABLET | Freq: Four times a day (QID) | ORAL | 1 refills | Status: DC | PRN

## 2018-07-22 MED ORDER — ASPIRIN 81 MG PO CHEW: 81.0000 mg | CHEWABLE_TABLET | Freq: Two times a day (BID) | ORAL | 0 refills | Status: DC

## 2018-07-22 NOTE — Progress Notes (Signed)
Physical Therapy Treatment Patient Details Name: Sharon [name] MRN: [id number] DOB: December 16, 1950 Today's Date: 07/22/2018    History of Present Illness Pt is 67 y/o female s/p L THA, anterior approach. Previous bil TKA. PMHx: HTN, HA, OA.     PT Comments    Pt progressing well with post-op mobility. She was able to demonstrate a full flight of stairs this session without assistance (sueprvision provided for safety). Pt anticipates d/c home today and feel she is safe to do so without another acute PT session. The patient reports already completing her HEP prior to therapy this morning, and she was educated on continued progression of exercise and walking distance. Pt also educated on car transfer. Will continue to follow and progress as able per POC.   Follow Up Recommendations  Home health PT     Equipment Recommendations  None recommended by PT    Recommendations for Other Services       Precautions / Restrictions Precautions Precautions: None Precaution Comments: Direct anterior approach Restrictions Weight Bearing Restrictions: Yes LLE Weight Bearing: Weight bearing as tolerated    Mobility  Bed Mobility Overal bed mobility: Modified Independent             General bed mobility comments: increased time, HOB elevated (able to do without elevation)   Transfers Overall transfer level: Modified independent Equipment used: Rolling walker (2 wheeled) Transfers: Sit to/from Stand           General transfer comment: No assist required for pt to power up to full stand from EOB or BSC. Pt did not require momentum to stand this session and pt demonstrated proper hand placement on seated surface for safety.   Ambulation/Gait Ambulation/Gait assistance: Supervision Gait Distance (Feet): 200 Feet Assistive device: Rolling walker (2 wheeled) Gait Pattern/deviations: Step-through pattern;Decreased stride length Gait velocity: slowed  Gait velocity interpretation: 1.31 -  2.62 ft/sec, indicative of limited community ambulator General Gait Details: VC's for increased heel strike, fluidity of walker movement, and improved posture. Pt was able to progress to smooth step-through gait pattern and maintain corrective changes throughout gait training.    Stairs Stairs: Yes Stairs assistance: Min guard Stair Management: One rail Right;Step to pattern;Forwards Number of Stairs: 10 General stair comments: VC's for sequencing and general safety with stair negotiation. HHA provided initially however pt was able to advance with only R railing use.    Wheelchair Mobility    Modified Rankin (Stroke Patients Only)       Balance Overall balance assessment: No apparent balance deficits (not formally assessed)                                          Cognition Arousal/Alertness: Awake/alert Behavior During Therapy: WFL for tasks assessed/performed Overall Cognitive Status: Within Functional Limits for tasks assessed                                        Exercises      General Comments        Pertinent Vitals/Pain Pain Assessment: Faces Faces Pain Scale: Hurts little more Pain Location: Left hip Pain Descriptors / Indicators: Discomfort;Sore Pain Intervention(s): Monitored during session    Home Living  Prior Function            PT Goals (current goals can now be found in the care plan section) Acute Rehab PT Goals Patient Stated Goal: return home, spend time with sons.  PT Goal Formulation: With patient Time For Goal Achievement: 08/04/18 Potential to Achieve Goals: Good Progress towards PT goals: Progressing toward goals    Frequency    7X/week      PT Plan Current plan remains appropriate    Co-evaluation              AM-PAC PT "6 Clicks" Daily Activity  Outcome Measure  Difficulty turning over in bed (including adjusting bedclothes, sheets and blankets)?: A  Little Difficulty moving from lying on back to sitting on the side of the bed? : A Little Difficulty sitting down on and standing up from a chair with arms (e.g., wheelchair, bedside commode, etc,.)?: A Lot Help needed moving to and from a bed to chair (including a wheelchair)?: A Little Help needed walking in hospital room?: A Little Help needed climbing 3-5 steps with a railing? : A Little 6 Click Score: 17    End of Session Equipment Utilized During Treatment: Gait belt Activity Tolerance: Patient tolerated treatment well Patient left: with call bell/phone within reach;in bed;with family/visitor present Nurse Communication: Mobility status PT Visit Diagnosis: Other abnormalities of gait and mobility (R26.89);Unsteadiness on feet (R26.81);Muscle weakness (generalized) (M62.81)     Time: 1751-0258 PT Time Calculation (min) (ACUTE ONLY): 46 min  Charges:  $Gait Training: 38-52 mins                     Sharon [name], PT, DPT Acute Rehabilitation Services Pager: [phone] Office: [phone]    Sharon [name] 07/22/2018, 9:49 AM

## 2018-07-22 NOTE — Progress Notes (Signed)
Subjective: 2 Days Post-Op Procedure(s) (LRB): LEFT TOTAL HIP ARTHROPLASTY ANTERIOR APPROACH (Left) Patient reports pain as mild.  Denies chest [pain, SOB, dizziness , nausea or vomiting.    Objective: Vital signs in last 24 hours: Temp:  [98.1 F (36.7 C)-98.6 F (37 C)] 98.1 F (36.7 C) (10/03 0520) Pulse Rate:  [58-63] 60 (10/03 0520) Resp:  [13-20] 14 (10/03 0520) BP: (110-127)/(60-70) 116/60 (10/03 0520) SpO2:  [95 %-97 %] 95 % (10/03 0520)  Intake/Output from previous day: 10/02 0701 - 10/03 0700 In: 600 [P.O.:600] Out: -  Intake/Output this shift: No intake/output data recorded.  Recent Labs    07/21/18 0626  HGB 10.7*   Recent Labs    07/21/18 0626  WBC 13.2*  RBC 3.35*  HCT 31.5*  PLT 264   Recent Labs    07/21/18 0626  NA 134*  K 3.9  CL 102  CO2 23  BUN 17  CREATININE 0.67  GLUCOSE 152*  CALCIUM 9.0   No results for input(s): LABPT, INR in the last 72 hours.  Sensation intact distally Intact pulses distally Dorsiflexion/Plantar flexion intact Incision: no drainage   Assessment/Plan: 2 Days Post-Op Procedure(s) (LRB): LEFT TOTAL HIP ARTHROPLASTY ANTERIOR APPROACH (Left) Discharge home with home health    [place] 07/22/2018, 9:21 AM

## 2018-07-22 NOTE — Plan of Care (Signed)
  Problem: Activity: Goal: Risk for activity intolerance will decrease Outcome: Adequate for Discharge   Problem: Elimination: Goal: Will not experience complications related to urinary retention Outcome: Completed/Met   Problem: Pain Managment: Goal: General experience of comfort will improve Outcome: Adequate for Discharge   Problem: Safety: Goal: Ability to remain free from injury will improve Outcome: Adequate for Discharge   

## 2018-07-22 NOTE — Discharge Summary (Signed)
Patient ID: Sharon [name] MRN: [id number] DOB/AGE: 1951-09-16 67 y.o.  Admit date: 07/20/2018 Discharge date: 07/22/2018  Admission Diagnoses:  Principal Problem:   Unilateral primary osteoarthritis, left hip Active Problems:   Status post total replacement of left hip   Discharge Diagnoses:  Same  Past Medical History:  Diagnosis Date  . Family history of adverse reaction to anesthesia    "mom did; don't know what happened" (07/20/2018)  . Family history of colon cancer   . Family history of ovarian cancer   . Family history of stomach cancer   . Family history of uterine cancer   . Fibroid   . Heart murmur    "tiny, tiny one" (07/20/2018)  . High cholesterol   . Hypertension   . Migraine    "a few/year; last one was 07/19/2018" (07/20/2018)  . Osteoarthritis    "knees, hips, hands, probably in my back" (07/20/2018)  . Ringworm   . Vitamin D deficiency     Surgeries: Procedure(s): LEFT TOTAL HIP ARTHROPLASTY ANTERIOR APPROACH on 07/20/2018   Consultants:   Discharged Condition: Improved  Hospital Course: Sharon [name] is an 67 y.o. female who was admitted 07/20/2018 for operative treatment ofUnilateral primary osteoarthritis, left hip. Patient has severe unremitting pain that affects sleep, daily activities, and work/hobbies. After pre-op clearance the patient was taken to the operating room on 07/20/2018 and underwent  Procedure(s): LEFT TOTAL HIP ARTHROPLASTY ANTERIOR APPROACH.    Patient was given perioperative antibiotics:  Anti-infectives (From admission, onward)   Start     Dose/Rate Route Frequency Ordered Stop   07/20/18 1900  clindamycin (CLEOCIN) IVPB 600 mg     600 mg 100 mL/hr over 30 Minutes Intravenous Every 6 hours 07/20/18 1743 07/21/18 0150   07/20/18 1015  clindamycin (CLEOCIN) IVPB 900 mg     900 mg 100 mL/hr over 30 Minutes Intravenous On call to O.R. 07/20/18 1005 07/20/18 1336       Patient was given sequential compression devices,  early ambulation, and chemoprophylaxis to prevent DVT.  Patient benefited maximally from hospital stay and there were no complications.    Recent vital signs:  Patient Vitals for the past 24 hrs:  BP Temp Temp src Pulse Resp SpO2  07/22/18 0520 116/60 98.1 F (36.7 C) Oral 60 14 95 %  07/21/18 1958 110/67 98.6 F (37 C) Oral 63 13 95 %  07/21/18 1623 127/64 98.3 F (36.8 C) Oral (!) 59 18 96 %  07/21/18 1034 111/70 - - (!) 58 20 97 %  07/21/18 1034 - 98.4 F (36.9 C) Oral - - -  07/21/18 1022 110/68 - - - - -     Recent laboratory studies:  Recent Labs    07/21/18 0626  WBC 13.2*  HGB 10.7*  HCT 31.5*  PLT 264  NA 134*  K 3.9  CL 102  CO2 23  BUN 17  CREATININE 0.67  GLUCOSE 152*  CALCIUM 9.0     Discharge Medications:   Allergies as of 07/22/2018      Reactions   Adhesive [tape] Other (See Comments)   Blisters   Cefuroxime Axetil    REACTION: rash   Codeine Other (See Comments)   headache   Ceftin Rash      Medication List    STOP taking these medications   amoxicillin 500 MG capsule Commonly known as:  AMOXIL   aspirin-acetaminophen-caffeine 250-250-65 MG tablet Commonly known as:  EXCEDRIN MIGRAINE   traMADol 50 MG tablet  Commonly known as:  ULTRAM     TAKE these medications   aspirin 81 MG chewable tablet Chew 1 tablet (81 mg total) by mouth 2 (two) times daily.   atorvastatin 10 MG tablet Commonly known as:  LIPITOR Take 10 mg by mouth daily.   CALCIUM 500 + D PO Take 2 each by mouth daily. Gummies   cycloSPORINE 0.05 % ophthalmic emulsion Commonly known as:  RESTASIS Place 1 drop into both eyes 2 (two) times daily.   fluticasone 50 MCG/ACT nasal spray Commonly known as:  FLONASE Place 1 spray into both nostrils daily as needed for allergies (spring/fall).   hydrochlorothiazide 25 MG tablet Commonly known as:  HYDRODIURIL Take 25 mg by mouth daily.   IMITREX 100 MG tablet Generic drug:  SUMAtriptan Take 100 mg by mouth as  needed for migraine or headache.   methocarbamol 500 MG tablet Commonly known as:  ROBAXIN Take 1 tablet (500 mg total) by mouth every 6 (six) hours as needed for muscle spasms.   oxyCODONE 5 MG immediate release tablet Commonly known as:  Oxy IR/ROXICODONE Take 1-2 tablets (5-10 mg total) by mouth every 4 (four) hours as needed for moderate pain (pain score 4-6).   verapamil 240 MG (CO) 24 hr tablet Commonly known as:  COVERA HS Take 240 mg by mouth 2 (two) times daily.            Durable Medical Equipment  (From admission, onward)         Start     Ordered   07/20/18 1744  DME 3 n 1  Once     07/20/18 1743   07/20/18 1744  DME Walker rolling  Once    Question:  Patient needs a walker to treat with the following condition  Answer:  Status post total replacement of left hip   07/20/18 1743          Diagnostic Studies: Dg Pelvis Portable  Result Date: 07/20/2018 CLINICAL DATA:  Status total left hip replacement. EXAM: PORTABLE PELVIS 1-2 VIEWS COMPARISON:  None. FINDINGS: Total left hip replacement is identified without malalignment. Postsurgical change of the skin stable are noted. IMPRESSION: Total left hip replacement is identified without malalignment. Electronically Signed   By: Abelardo Diesel M.D.   On: 07/20/2018 18:37   Dg C-arm 1-60 Min  Result Date: 07/20/2018 CLINICAL DATA:  Left anterior hip arthroplasty EXAM: DG C-ARM 61-120 MIN COMPARISON:  None. FINDINGS: C-arm fluoroscopy was provided during anterior left hip arthroplasty. Fluoroscopy time of 33 seconds was recorded. IMPRESSION: C-arm fluoroscopy provided. Electronically Signed   By: Ivar Drape M.D.   On: 07/20/2018 14:38   Dg Hip Operative Unilat With Pelvis Left  Result Date: 07/20/2018 CLINICAL DATA:  Anterior left hip arthroplasty EXAM: OPERATIVE left HIP (WITH PELVIS IF PERFORMED) 6 VIEWS TECHNIQUE: Fluoroscopic spot image(s) were submitted for interpretation post-operatively. COMPARISON:  None.  FINDINGS: C-arm spot films were obtained showing placement of acetabular and femoral components of the left hip replacement. No complicating features are seen. IMPRESSION: C-arm fluoroscopy provided during left anterior hip replacement. Electronically Signed   By: Ivar Drape M.D.   On: 07/20/2018 15:11    Disposition:     Follow-up Information    Mcarthur Rossetti, MD Follow up in 2 week(s).   Specialty:  Orthopedic Surgery Contact information: [place] [address] [phone]        [facility] up.   Specialty:  Home Health  Services Why:  A representative from [facility] will contact you to arrange start date and time for your therapy. Contact information: [address] [place] [address] [phone]            Signed: Erskine Emery 07/22/2018, 9:41 AM

## 2018-07-22 NOTE — Discharge Instructions (Signed)

## 2018-07-23 DIAGNOSIS — Z7982 Long term (current) use of aspirin: Secondary | ICD-10-CM | POA: Diagnosis not present

## 2018-07-23 DIAGNOSIS — Z9181 History of falling: Secondary | ICD-10-CM | POA: Diagnosis not present

## 2018-07-23 DIAGNOSIS — Z96653 Presence of artificial knee joint, bilateral: Secondary | ICD-10-CM | POA: Diagnosis not present

## 2018-07-23 DIAGNOSIS — Z7951 Long term (current) use of inhaled steroids: Secondary | ICD-10-CM | POA: Diagnosis not present

## 2018-07-23 DIAGNOSIS — Z471 Aftercare following joint replacement surgery: Secondary | ICD-10-CM | POA: Diagnosis not present

## 2018-07-23 DIAGNOSIS — E559 Vitamin D deficiency, unspecified: Secondary | ICD-10-CM | POA: Diagnosis not present

## 2018-07-23 DIAGNOSIS — Z96642 Presence of left artificial hip joint: Secondary | ICD-10-CM | POA: Diagnosis not present

## 2018-07-23 DIAGNOSIS — I10 Essential (primary) hypertension: Secondary | ICD-10-CM | POA: Diagnosis not present

## 2018-07-26 ENCOUNTER — Telehealth (INDEPENDENT_AMBULATORY_CARE_PROVIDER_SITE_OTHER): Payer: Self-pay | Admitting: Orthopaedic Surgery

## 2018-07-26 NOTE — Telephone Encounter (Signed)
Verbal given to [employer]

## 2018-07-26 NOTE — Telephone Encounter (Signed)
Sharon [name]-(PT) with [facility] called needing verbal orders for HHPT 1 wk1 and 3 wk 2  The number to contact Sharon [name] is [phone]

## 2018-07-27 ENCOUNTER — Telehealth (INDEPENDENT_AMBULATORY_CARE_PROVIDER_SITE_OTHER): Payer: Self-pay

## 2018-07-27 MED ORDER — TIZANIDINE HCL 4 MG PO TABS: 4.0000 mg | ORAL_TABLET | Freq: Three times a day (TID) | ORAL | 0 refills | Status: DC | PRN

## 2018-07-27 NOTE — Telephone Encounter (Signed)
Can you send in a different muscle relaxer other than methocarbamol, her insurance will not cover

## 2018-07-27 NOTE — Telephone Encounter (Signed)
I sent in Zanaflex 

## 2018-08-04 ENCOUNTER — Telehealth (INDEPENDENT_AMBULATORY_CARE_PROVIDER_SITE_OTHER): Payer: Self-pay | Admitting: Orthopaedic Surgery

## 2018-08-04 NOTE — Telephone Encounter (Signed)
Patient called advised she received a call from [facility] concerning a Rx for a Muscle relaxer. Patient said the Rx is about a week old. Patient said she received 2 Rx's when she left the hospital and don't know why there is a Rx waiting for her.  The number to contact patient is [phone]

## 2018-08-05 ENCOUNTER — Other Ambulatory Visit (INDEPENDENT_AMBULATORY_CARE_PROVIDER_SITE_OTHER): Payer: Self-pay

## 2018-08-05 MED ORDER — METHOCARBAMOL 500 MG PO TABS: 500.0000 mg | ORAL_TABLET | Freq: Four times a day (QID) | ORAL | 1 refills | Status: DC | PRN

## 2018-08-05 NOTE — Telephone Encounter (Signed)
Cancelled the zanaflex with the pharmacy , she states she picked up Robaxin anyway because this works for her and just paid out of pocket

## 2018-08-09 ENCOUNTER — Encounter (INDEPENDENT_AMBULATORY_CARE_PROVIDER_SITE_OTHER): Payer: Self-pay | Admitting: Orthopaedic Surgery

## 2018-08-09 ENCOUNTER — Ambulatory Visit (INDEPENDENT_AMBULATORY_CARE_PROVIDER_SITE_OTHER): Payer: PPO | Admitting: Orthopaedic Surgery

## 2018-08-09 DIAGNOSIS — Z96642 Presence of left artificial hip joint: Secondary | ICD-10-CM

## 2018-08-09 NOTE — Progress Notes (Signed)
Patient comes today 20 days status post a left total hip arthroplasty direct anterior approach.  She is doing well overall.  She is been on aspirin twice a day and wearing compressive garments.  On exam remove the staples I did place Steri-Strips.  She is had a problem Steri-Strips before causing blistering but I was able to aspirate 100 cc of seroma off of her hip and not place any benzoin around the Steri-Strips.  I gave her reassurance that this should be fine for her.  Overall she is doing great.  We will have her stop her aspirin twice a day and she get in the pool in 2 weeks from now.  She is using a cane sparingly.  She can drive from my standpoint.  She has any issues between now and 4 weeks now she will let us know.  Otherwise we will see her back in 4 weeks no x-rays related.  All question concerns were answered and addressed.

## 2018-08-10 DIAGNOSIS — L309 Dermatitis, unspecified: Secondary | ICD-10-CM | POA: Diagnosis not present

## 2018-08-11 DIAGNOSIS — B359 Dermatophytosis, unspecified: Secondary | ICD-10-CM | POA: Diagnosis not present

## 2018-08-11 DIAGNOSIS — I10 Essential (primary) hypertension: Secondary | ICD-10-CM | POA: Diagnosis not present

## 2018-08-22 ENCOUNTER — Encounter: Payer: Self-pay | Admitting: Genetic Counselor

## 2018-08-22 NOTE — Progress Notes (Signed)
  UPDATE: BRIP1 [id number] has been reclassified to Likely Benign.  The change in variant classification was made as a result of re-review of the evidence in light of new variant interpretation guidelines and/or new information.  The updated report date is 08/13/2018.

## 2018-09-01 ENCOUNTER — Ambulatory Visit (INDEPENDENT_AMBULATORY_CARE_PROVIDER_SITE_OTHER): Payer: PPO | Admitting: Obstetrics and Gynecology

## 2018-09-01 ENCOUNTER — Other Ambulatory Visit: Payer: Self-pay

## 2018-09-01 ENCOUNTER — Telehealth: Payer: Self-pay | Admitting: *Deleted

## 2018-09-01 ENCOUNTER — Encounter: Payer: Self-pay | Admitting: Obstetrics and Gynecology

## 2018-09-01 VITALS — BP 138/80 | HR 72 | Temp 98.6°F | Wt 177.8 lb

## 2018-09-01 DIAGNOSIS — R102 Pelvic and perineal pain: Secondary | ICD-10-CM | POA: Diagnosis not present

## 2018-09-01 DIAGNOSIS — N898 Other specified noninflammatory disorders of vagina: Secondary | ICD-10-CM

## 2018-09-01 DIAGNOSIS — R350 Frequency of micturition: Secondary | ICD-10-CM

## 2018-09-01 LAB — POCT URINALYSIS DIPSTICK
Bilirubin, UA: NEGATIVE
Glucose, UA: NEGATIVE
KETONES UA: NEGATIVE
NITRITE UA: NEGATIVE
PROTEIN UA: NEGATIVE
RBC UA: POSITIVE
SPEC GRAV UA: 1.01 (ref 1.010–1.025)
UROBILINOGEN UA: 0.2 U/dL
pH, UA: 8 (ref 5.0–8.0)

## 2018-09-01 MED ORDER — PHENAZOPYRIDINE HCL 200 MG PO TABS: 200.0000 mg | ORAL_TABLET | Freq: Three times a day (TID) | ORAL | 0 refills | Status: DC | PRN

## 2018-09-01 MED ORDER — SULFAMETHOXAZOLE-TRIMETHOPRIM 800-160 MG PO TABS: 1.0000 | ORAL_TABLET | Freq: Two times a day (BID) | ORAL | 0 refills | Status: DC

## 2018-09-01 NOTE — Progress Notes (Signed)
GYNECOLOGY  VISIT   HPI: 67 y.o.   Married White or Caucasian Not Hispanic or Latino  female   [phone] with Patient's last menstrual period was 02/17/1989.   here for UTI symptoms that began yesterday. She c/o lower abdominal and vaginal discomfort. She c/o urinary frequency, no change in urinary urgency. No dysuria. No vaginal d/c, no itching, burning or irritation. Denies any fever or chills. Some lower back pain.   GYNECOLOGIC HISTORY: Patient's last menstrual period was 02/17/1989. Contraception: Hysterectomy Menopausal hormone therapy: None        OB History    Gravida  2   Para  2   Term  2   Preterm      AB      Living  2     SAB      TAB      Ectopic      Multiple      Live Births                 Patient Active Problem List   Diagnosis Date Noted  . Status post total replacement of left hip 07/20/2018  . Unilateral primary osteoarthritis, left hip 05/25/2018  . Genetic testing 11/14/2016  . Family history of ovarian cancer   . Family history of colon cancer   . Family history of uterine cancer   . Family history of stomach cancer   . Osteoarthritis of left knee 04/03/2015  . S/P total knee replacement using cement 04/03/2015  . Gait abnormality 12/22/2012  . Swelling of ankle 12/22/2012  . Ringworm   . Hypertension   . Fibroid   . Headache(784.0)   . Arthritis   . Vitamin D deficiency     Past Medical History:  Diagnosis Date  . Family history of adverse reaction to anesthesia    "mom did; don't know what happened" (07/20/2018)  . Family history of colon cancer   . Family history of ovarian cancer   . Family history of stomach cancer   . Family history of uterine cancer   . Fibroid   . Heart murmur    "tiny, tiny one" (07/20/2018)  . High cholesterol   . Hypertension   . Migraine    "a few/year; last one was 07/19/2018" (07/20/2018)  . Osteoarthritis    "knees, hips, hands, probably in my back" (07/20/2018)  . Ringworm   . Vitamin D  deficiency     Past Surgical History:  Procedure Laterality Date  . APPENDECTOMY  1969  . BUNIONECTOMY Bilateral   . JOINT REPLACEMENT    . KNEE ARTHROSCOPY Left 11/2013  . Precancerous lesion excised     "arm"  . TONSILLECTOMY    . TOTAL ABDOMINAL HYSTERECTOMY  1990   TAH BSO  . TOTAL HIP ARTHROPLASTY Left 07/20/2018  . TOTAL HIP ARTHROPLASTY Left 07/20/2018   Procedure: LEFT TOTAL HIP ARTHROPLASTY ANTERIOR APPROACH;  Surgeon: Mcarthur Rossetti, MD;  Location: [facility];  Service: Orthopedics;  Laterality: Left;  . TOTAL KNEE ARTHROPLASTY Right 2012  . TOTAL KNEE ARTHROPLASTY Left 04/03/2015   Procedure: TOTAL KNEE ARTHROPLASTY;  Surgeon: Garald Balding, MD;  Location: [facility];  Service: Orthopedics;  Laterality: Left;    Current Outpatient Medications  Medication Sig Dispense Refill  . atorvastatin (LIPITOR) 10 MG tablet Take 10 mg by mouth daily.    . Calcium Carbonate-Vitamin D (CALCIUM 500 + D PO) Take 2 each by mouth daily. Gummies    . fluticasone (FLONASE) 50  MCG/ACT nasal spray Place 1 spray into both nostrils daily as needed for allergies (spring/fall).     . hydrochlorothiazide (HYDRODIURIL) 25 MG tablet Take 25 mg by mouth daily.    . methocarbamol (ROBAXIN) 500 MG tablet Take 1 tablet (500 mg total) by mouth every 6 (six) hours as needed for muscle spasms. 40 tablet 1  . SUMAtriptan (IMITREX) 100 MG tablet Take 100 mg by mouth as needed for migraine or headache.     . verapamil (COVERA HS) 240 MG (CO) 24 hr tablet Take 240 mg by mouth 2 (two) times daily.     . cycloSPORINE (RESTASIS) 0.05 % ophthalmic emulsion Place 1 drop into both eyes 2 (two) times daily.      No current facility-administered medications for this visit.      ALLERGIES: Adhesive [tape]; Cefuroxime axetil; Codeine; and Ceftin  Family History  Problem Relation Age of Onset  . Ovarian cancer Mother 1  . Heart disease Maternal Grandfather   . Hypertension Paternal Grandmother   . Ovarian  cancer Cousin        maternal first cousin dx in her 36s  . Heart disease Father   . Atrial fibrillation Father   . Heart disease Brother   . Heart disease Brother   . Colon cancer Maternal Aunt        dx in her 51s-60  . Diabetes Paternal Aunt   . Ovarian cancer Maternal Grandmother        dx in her mid 35s  . Cancer Maternal Grandmother        OVARIAN AND UTERINE  . Uterine cancer Maternal Grandmother        dx in her early 52s  . Stomach cancer Maternal Uncle        dx in his 26s-80s  . Stroke Maternal Uncle   . Leukemia Paternal Uncle     Social History   Socioeconomic History  . Marital status: Married    Spouse name: Not on file  . Number of children: Not on file  . Years of education: Not on file  . Highest education level: Not on file  Occupational History  . Not on file  Social Needs  . Financial resource strain: Not on file  . Food insecurity:    Worry: Not on file    Inability: Not on file  . Transportation needs:    Medical: Not on file    Non-medical: Not on file  Tobacco Use  . Smoking status: Never Smoker  . Smokeless tobacco: Never Used  Substance and Sexual Activity  . Alcohol use: Yes    Alcohol/week: 3.0 standard drinks    Types: 3 Standard drinks or equivalent per week  . Drug use: No  . Sexual activity: Not Currently    Partners: Male    Birth control/protection: Surgical, Post-menopausal  Lifestyle  . Physical activity:    Days per week: Not on file    Minutes per session: Not on file  . Stress: Not on file  Relationships  . Social connections:    Talks on phone: Not on file    Gets together: Not on file    Attends religious service: Not on file    Active member of club or organization: Not on file    Attends meetings of clubs or organizations: Not on file    Relationship status: Not on file  . Intimate partner violence:    Fear of current or ex partner: Not on file  Emotionally abused: Not on file    Physically abused: Not on  file    Forced sexual activity: Not on file  Other Topics Concern  . Not on file  Social History Narrative  . Not on file    Review of Systems  Constitutional: Negative.   HENT: Negative.   Eyes: Negative.   Respiratory: Negative.   Cardiovascular: Negative.   Gastrointestinal: Positive for abdominal pain.  Genitourinary: Positive for frequency and urgency.  Musculoskeletal: Negative.   Skin: Negative.   Neurological: Negative.   Endo/Heme/Allergies: Negative.   Psychiatric/Behavioral: Negative.     PHYSICAL EXAMINATION:    BP 138/80 (BP Location: Right Arm, Patient Position: Sitting, Cuff Size: Normal)   Pulse 72   Temp 98.6 F (37 C)   Wt 177 lb 12.8 oz (80.6 kg)   LMP 02/17/1989   BMI 27.85 kg/m     General appearance: alert, cooperative and appears stated age Abdomen: soft, tender in the suprapubic region; non distended, no masses,  no organomegaly CVA: not tender  Pelvic: External genitalia:  no lesions              Urethra:  normal appearing urethra with no masses, tenderness or lesions              Bartholins and Skenes: normal                 Vagina: atrophic appearing vagina with a small amount of white, watery vaginal discharge, no lesions              Cervix: absent              Bimanual Exam:  Uterus:  uterus absent              Adnexa: no mass, fullness, tenderness              Bladder: mildly tender  Chaperone was present for exam.  Urine dip: trace leuk, + blood  ASSESSMENT Urinary frequency, suprapubic discomfort Slight vaginal irritation    PLAN Urine for ua, c&s Bactrim DS and pyridium Affirm sent   An After Visit Summary was printed and given to the patient.

## 2018-09-01 NOTE — Telephone Encounter (Signed)
Call transferred from front office. Spoke with patient. Patient reports urinary frequency and intermittent pelvic discomfort since yesterday. Urine is clear. Denies blood in urine, lower back pain or fever. Reports chills, states she is unsure if weather related. Denies vaginal d/c, odor or bleeding.   Recommended OV for further evaluation. Patient placed on brief hold to review schedule with provider.   Patient will come to office now, is aware will be worked into schedule.  Patient verbalizes understanding and is agreeable.   Encounter closed.

## 2018-09-01 NOTE — Patient Instructions (Signed)

## 2018-09-02 LAB — VAGINITIS/VAGINOSIS, DNA PROBE
CANDIDA SPECIES: NEGATIVE
Gardnerella vaginalis: NEGATIVE
TRICHOMONAS VAG: NEGATIVE

## 2018-09-02 LAB — URINE CULTURE

## 2018-09-02 LAB — URINALYSIS, MICROSCOPIC ONLY
Bacteria, UA: NONE SEEN
CASTS: NONE SEEN /LPF
Epithelial Cells (non renal): NONE SEEN /hpf (ref 0–10)

## 2018-09-06 ENCOUNTER — Ambulatory Visit (INDEPENDENT_AMBULATORY_CARE_PROVIDER_SITE_OTHER): Payer: PPO | Admitting: Orthopaedic Surgery

## 2018-09-06 ENCOUNTER — Encounter (INDEPENDENT_AMBULATORY_CARE_PROVIDER_SITE_OTHER): Payer: Self-pay | Admitting: Orthopaedic Surgery

## 2018-09-06 DIAGNOSIS — Z96642 Presence of left artificial hip joint: Secondary | ICD-10-CM

## 2018-09-06 NOTE — Progress Notes (Signed)
The patient is here today 6 weeks status post a left total hip arthroplasty.  He is very active 67 year old he has no issues at all.  On exam she is walking without a limp on assistive ice.  She x-rays a history of bilateral total knee arthroplasties.  She has no issues with her left operative hip she states.  Exam the left hip moves fluidly.  Her ligaments are equal.  There are no issues to see on exam.  All question concerns were answered and addressed.  She will increase her activities as comfort allows with no restrictions.  We will see her back in 6 months from now.  I like a standing low AP pelvis and lateral of her left operative hip at that visit.

## 2018-09-07 ENCOUNTER — Ambulatory Visit (INDEPENDENT_AMBULATORY_CARE_PROVIDER_SITE_OTHER): Payer: PPO

## 2018-09-07 VITALS — BP 130/74 | HR 74 | Resp 16 | Ht 67.0 in | Wt 177.0 lb

## 2018-09-07 DIAGNOSIS — R350 Frequency of micturition: Secondary | ICD-10-CM | POA: Diagnosis not present

## 2018-09-07 NOTE — Progress Notes (Signed)
Patient here for clean catch urin micro. Patient states that she is having discomfort in her pelvis. She was having the urgency yesterday but is not feeling that way today.   Routing to provider for final review.

## 2018-09-08 DIAGNOSIS — Z1231 Encounter for screening mammogram for malignant neoplasm of breast: Secondary | ICD-10-CM | POA: Diagnosis not present

## 2018-09-08 LAB — URINALYSIS, MICROSCOPIC ONLY: Casts: NONE SEEN /lpf

## 2018-09-09 ENCOUNTER — Telehealth: Payer: Self-pay

## 2018-09-09 DIAGNOSIS — R3129 Other microscopic hematuria: Secondary | ICD-10-CM

## 2018-09-09 DIAGNOSIS — R350 Frequency of micturition: Secondary | ICD-10-CM

## 2018-09-09 NOTE — Telephone Encounter (Signed)
-----   Message from Salvadore Dom, MD sent at 09/09/2018  2:20 PM EST ----- Please inform the patient that she still has microscopic blood in her urine and set up a Urology referral

## 2018-09-09 NOTE — Telephone Encounter (Signed)
Left message to call Varonica Siharath at [phone]. 

## 2018-09-10 NOTE — Telephone Encounter (Signed)
Spoke with patient and discussed message from Dr. Talbert Nan. She is agreeable to referral to [facility].  Advised she will be contacted for appointment.  She will also update her PCP.  Referral placed.   cc Magdalene Idalee.  Encounter closed.

## 2018-09-15 DIAGNOSIS — R311 Benign essential microscopic hematuria: Secondary | ICD-10-CM | POA: Diagnosis not present

## 2018-10-04 ENCOUNTER — Encounter: Payer: Self-pay | Admitting: Obstetrics and Gynecology

## 2018-10-04 DIAGNOSIS — R3129 Other microscopic hematuria: Secondary | ICD-10-CM | POA: Diagnosis not present

## 2018-10-04 DIAGNOSIS — R311 Benign essential microscopic hematuria: Secondary | ICD-10-CM | POA: Diagnosis not present

## 2018-10-06 ENCOUNTER — Other Ambulatory Visit: Payer: Self-pay

## 2018-10-06 ENCOUNTER — Ambulatory Visit: Payer: PPO | Admitting: Obstetrics and Gynecology

## 2018-10-06 ENCOUNTER — Ambulatory Visit (INDEPENDENT_AMBULATORY_CARE_PROVIDER_SITE_OTHER): Payer: PPO | Admitting: Obstetrics and Gynecology

## 2018-10-06 ENCOUNTER — Encounter: Payer: Self-pay | Admitting: Obstetrics and Gynecology

## 2018-10-06 VITALS — BP 122/82 | HR 80 | Ht 66.0 in | Wt 174.0 lb

## 2018-10-06 DIAGNOSIS — Z01419 Encounter for gynecological examination (general) (routine) without abnormal findings: Secondary | ICD-10-CM | POA: Diagnosis not present

## 2018-10-06 NOTE — Progress Notes (Signed)
67 y.o. G34P2002 Married White or Caucasian Not Hispanic or Latino female here for annual exam.   She has a FH of ovarian and colon cancer. She has had negative BRCA testing and negative testing for Lynch. S/P TAH/BSO. Undergoing evaluation with urology for hematuria, negative CT scan, cystoscopy is being scheduled. She continues to have some pelvic discomfort, thinks it may be from her hip replacement in 10/19. Not sexually active since then.  Dad died last weekend.     Patient's last menstrual period was 02/17/1989.          Sexually active: Yes.    The current method of family planning is post menopausal status, hysterectomy.    Exercising: Yes.    walking Smoker:  no  Health Maintenance: Pap:  2013 WNL  History of abnormal Pap:  no MMG:  09/08/2018 Birads 1 negative Colonoscopy:  07-21-14 mild diverticulosis, due next year.   BMD:   09-22-16 WNL  TDaP:  UTD per patient  Gardasil: N/A   reports that she has never smoked. She has never used smokeless tobacco. She reports current alcohol use of about 1.0 - 2.0 standard drinks of alcohol per week. She reports that she does not use drugs. Retired Pharmacist, hospital. She has 2 sons, not local, no grandchildren.   Past Medical History:  Diagnosis Date  . Family history of adverse reaction to anesthesia    "mom did; don't know what happened" (07/20/2018)  . Family history of colon cancer   . Family history of ovarian cancer   . Family history of stomach cancer   . Family history of uterine cancer   . Fibroid   . Heart murmur    "tiny, tiny one" (07/20/2018)  . High cholesterol   . Hypertension   . Migraine    "a few/year; last one was 07/19/2018" (07/20/2018)  . Osteoarthritis    "knees, hips, hands, probably in my back" (07/20/2018)  . Ringworm   . Vitamin D deficiency     Past Surgical History:  Procedure Laterality Date  . APPENDECTOMY  1969  . BUNIONECTOMY Bilateral   . JOINT REPLACEMENT    . KNEE ARTHROSCOPY Left 11/2013  .  Precancerous lesion excised     "arm"  . TONSILLECTOMY    . TOTAL ABDOMINAL HYSTERECTOMY  1990   TAH BSO  . TOTAL HIP ARTHROPLASTY Left 07/20/2018  . TOTAL HIP ARTHROPLASTY Left 07/20/2018   Procedure: LEFT TOTAL HIP ARTHROPLASTY ANTERIOR APPROACH;  Surgeon: Mcarthur Rossetti, MD;  Location: [facility];  Service: Orthopedics;  Laterality: Left;  . TOTAL KNEE ARTHROPLASTY Right 2012  . TOTAL KNEE ARTHROPLASTY Left 04/03/2015   Procedure: TOTAL KNEE ARTHROPLASTY;  Surgeon: Garald Balding, MD;  Location: [facility];  Service: Orthopedics;  Laterality: Left;    Current Outpatient Medications  Medication Sig Dispense Refill  . atorvastatin (LIPITOR) 10 MG tablet Take 10 mg by mouth daily.    . Calcium Carbonate-Vitamin D (CALCIUM 500 + D PO) Take 2 each by mouth daily. Gummies    . cycloSPORINE (RESTASIS) 0.05 % ophthalmic emulsion Place 1 drop into both eyes 2 (two) times daily.     . fluticasone (FLONASE) 50 MCG/ACT nasal spray Place 1 spray into both nostrils daily as needed for allergies (spring/fall).     . hydrochlorothiazide (HYDRODIURIL) 25 MG tablet Take 25 mg by mouth daily.    . methocarbamol (ROBAXIN) 500 MG tablet Take 1 tablet (500 mg total) by mouth every 6 (six) hours as needed for  muscle spasms. 40 tablet 1  . SUMAtriptan (IMITREX) 100 MG tablet Take 100 mg by mouth as needed for migraine or headache.     . verapamil (COVERA HS) 240 MG (CO) 24 hr tablet Take 240 mg by mouth 2 (two) times daily.      No current facility-administered medications for this visit.     Family History  Problem Relation Age of Onset  . Ovarian cancer Mother 69  . Heart disease Maternal Grandfather   . Hypertension Paternal Grandmother   . Ovarian cancer Cousin        maternal first cousin dx in her 42s  . Heart disease Father   . Atrial fibrillation Father   . Heart disease Brother   . Heart disease Brother   . Colon cancer Maternal Aunt        dx in her 43s-60  . Diabetes Paternal Aunt   .  Ovarian cancer Maternal Grandmother        dx in her mid 81s  . Cancer Maternal Grandmother        OVARIAN AND UTERINE  . Uterine cancer Maternal Grandmother        dx in her early 41s  . Stomach cancer Maternal Uncle        dx in his 25s-80s  . Stroke Maternal Uncle   . Leukemia Paternal Uncle     Review of Systems  Constitutional: Negative.   HENT: Negative.   Eyes: Negative.   Respiratory: Negative.   Cardiovascular: Negative.   Gastrointestinal: Negative.   Endocrine: Negative.   Genitourinary: Positive for frequency and urgency.       Loss of urine spontaneously  Musculoskeletal: Negative.   Skin:       itching  Allergic/Immunologic: Negative.   Neurological: Negative.   Hematological: Negative.   Psychiatric/Behavioral: Negative.     Exam:   LMP 02/17/1989   Weight change: '@WEIGHTCHANGE' @ Height:      Ht Readings from Last 3 Encounters:  09/07/18 '5\' 7"'  (1.702 m)  07/20/18 '5\' 7"'  (1.702 m)  07/14/18 '5\' 7"'  (1.702 m)    General appearance: alert, cooperative and appears stated age Head: Normocephalic, without obvious abnormality, atraumatic Neck: no adenopathy, supple, symmetrical, trachea midline and thyroid normal to inspection and palpation Lungs: clear to auscultation bilaterally Cardiovascular: regular rate and rhythm Breasts: normal appearance, no masses or tenderness Abdomen: soft, non-tender; non distended,  no masses,  no organomegaly Extremities: extremities normal, atraumatic, no cyanosis or edema Skin: Skin color, texture, turgor normal. No rashes or lesions Lymph nodes: Cervical, supraclavicular, and axillary nodes normal. No abnormal inguinal nodes palpated Neurologic: Grossly normal   Pelvic: External genitalia:  no lesions              Urethra:  normal appearing urethra with no masses, tenderness or lesions              Bartholins and Skenes: normal                 Vagina: normal appearing vagina with normal color and discharge, no lesions               Cervix: absent               Bimanual Exam:  Uterus:  uterus absent              Adnexa: no mass, fullness, tenderness               Rectovaginal: Confirms  Anus:  normal sphincter tone, no lesions  Chaperone was present for exam.  A:  Well Woman with normal exam  P:   No pap needed  Currently being evaluated by Urology for microscopic hematuria  Mammogram and DEXA are UTD  Colonoscopy next year  Discussed breast self exam  Discussed calcium and vit D intake  Screening labs with primary

## 2018-10-06 NOTE — Patient Instructions (Signed)
EXERCISE AND DIET:  We recommended that you start or continue a regular exercise program for good health. Regular exercise means any activity that makes your heart beat faster and makes you sweat.  We recommend exercising at least 30 minutes per day at least 3 days a week, preferably 4 or 5.  We also recommend a diet low in fat and sugar.  Inactivity, poor dietary choices and obesity can cause diabetes, heart attack, stroke, and kidney damage, among others.    ALCOHOL AND SMOKING:  Women should limit their alcohol intake to no more than 7 drinks/beers/glasses of wine (combined, not each!) per week. Moderation of alcohol intake to this level decreases your risk of breast cancer and liver damage. And of course, no recreational drugs are part of a healthy lifestyle.  And absolutely no smoking or even second hand smoke. Most people know smoking can cause heart and lung diseases, but did you know it also contributes to weakening of your bones? Aging of your skin?  Yellowing of your teeth and nails?  CALCIUM AND VITAMIN D:  Adequate intake of calcium and Vitamin D are recommended.  The recommendations for exact amounts of these supplements seem to change often, but generally speaking 1,200 mg of calcium (between diet and supplement) and 800 units of Vitamin D per day seems prudent. Certain women may benefit from higher intake of Vitamin D.  If you are among these women, your doctor will have told you during your visit.    PAP SMEARS:  Pap smears, to check for cervical cancer or precancers,  have traditionally been done yearly, although recent scientific advances have shown that most women can have pap smears less often.  However, every woman still should have a physical exam from her gynecologist every year. It will include a breast check, inspection of the vulva and vagina to check for abnormal growths or skin changes, a visual exam of the cervix, and then an exam to evaluate the size and shape of the uterus and  ovaries.  And after 68 years of age, a rectal exam is indicated to check for rectal cancers. We will also provide age appropriate advice regarding health maintenance, like when you should have certain vaccines, screening for sexually transmitted diseases, bone density testing, colonoscopy, mammograms, etc.   MAMMOGRAMS:  All women over 68 years old should have a yearly mammogram. Many facilities now offer a "3D" mammogram, which may cost around $50 extra out of pocket. If possible,  we recommend you accept the option to have the 3D mammogram performed.  It both reduces the number of women who will be called back for extra views which then turn out to be normal, and it is better than the routine mammogram at detecting truly abnormal areas.    COLONOSCOPY:  Colonoscopy to screen for colon cancer is recommended for all women at age 68.  We know, you hate the idea of the prep.  We agree, BUT, having colon cancer and not knowing it is worse!!  Colon cancer so often starts as a polyp that can be seen and removed at colonscopy, which can quite literally save your life!  And if your first colonoscopy is normal and you have no family history of colon cancer, most women don't have to have it again for 10 years.  Once every ten years, you can do something that may end up saving your life, right?  We will be happy to help you get it scheduled when you are ready.  Be sure to check your insurance coverage so you understand how much it will cost.  It may be covered as a preventative service at no cost, but you should check your particular policy.      Breast Self-Awareness Breast self-awareness means being familiar with how your breasts look and feel. It involves checking your breasts regularly and reporting any changes to your health care provider. Practicing breast self-awareness is important. A change in your breasts can be a sign of a serious medical problem. Being familiar with how your breasts look and feel allows  you to find any problems early, when treatment is more likely to be successful. All women should practice breast self-awareness, including women who have had breast implants. How to do a breast self-exam One way to learn what is normal for your breasts and whether your breasts are changing is to do a breast self-exam. To do a breast self-exam: Look for Changes  1. Remove all the clothing above your waist. 2. Stand in front of a mirror in a room with good lighting. 3. Put your hands on your hips. 4. Push your hands firmly downward. 5. Compare your breasts in the mirror. Look for differences between them (asymmetry), such as: ? Differences in shape. ? Differences in size. ? Puckers, dips, and bumps in one breast and not the other. 6. Look at each breast for changes in your skin, such as: ? Redness. ? Scaly areas. 7. Look for changes in your nipples, such as: ? Discharge. ? Bleeding. ? Dimpling. ? Redness. ? A change in position. Feel for Changes Carefully feel your breasts for lumps and changes. It is best to do this while lying on your back on the floor and again while sitting or standing in the shower or tub with soapy water on your skin. Feel each breast in the following way:  Place the arm on the side of the breast you are examining above your head.  Feel your breast with the other hand.  Start in the nipple area and make  inch (2 cm) overlapping circles to feel your breast. Use the pads of your three middle fingers to do this. Apply light pressure, then medium pressure, then firm pressure. The light pressure will allow you to feel the tissue closest to the skin. The medium pressure will allow you to feel the tissue that is a little deeper. The firm pressure will allow you to feel the tissue close to the ribs.  Continue the overlapping circles, moving downward over the breast until you feel your ribs below your breast.  Move one finger-width toward the center of the body. Continue  to use the  inch (2 cm) overlapping circles to feel your breast as you move slowly up toward your collarbone.  Continue the up and down exam using all three pressures until you reach your armpit.  Write Down What You Find  Write down what is normal for each breast and any changes that you find. Keep a written record with breast changes or normal findings for each breast. By writing this information down, you do not need to depend only on memory for size, tenderness, or location. Write down where you are in your menstrual cycle, if you are still menstruating. If you are having trouble noticing differences in your breasts, do not get discouraged. With time you will become more familiar with the variations in your breasts and more comfortable with the exam. How often should I examine my breasts? Examine your  breasts every month. If you are breastfeeding, the best time to examine your breasts is after a feeding or after using a breast pump. If you menstruate, the best time to examine your breasts is 5-7 days after your period is over. During your period, your breasts are lumpier, and it may be more difficult to notice changes. When should I see my health care provider? See your health care provider if you notice:  A change in shape or size of your breasts or nipples.  A change in the skin of your breast or nipples, such as a reddened or scaly area.  Unusual discharge from your nipples.  A lump or thick area that was not there before.  Pain in your breasts.  Anything that concerns you. This information is not intended to replace advice given to you by your health care provider. Make sure you discuss any questions you have with your health care provider. Document Released: 10/06/2005 Document Revised: 03/13/2016 Document Reviewed: 08/26/2015 Elsevier Interactive Patient Education  2019 [employer].

## 2018-10-18 DIAGNOSIS — M545 Low back pain: Secondary | ICD-10-CM | POA: Diagnosis not present

## 2018-10-18 DIAGNOSIS — M25552 Pain in left hip: Secondary | ICD-10-CM | POA: Diagnosis not present

## 2018-10-25 DIAGNOSIS — H16223 Keratoconjunctivitis sicca, not specified as Sjogren's, bilateral: Secondary | ICD-10-CM | POA: Diagnosis not present

## 2018-10-25 DIAGNOSIS — H43813 Vitreous degeneration, bilateral: Secondary | ICD-10-CM | POA: Diagnosis not present

## 2018-10-25 DIAGNOSIS — H35363 Drusen (degenerative) of macula, bilateral: Secondary | ICD-10-CM | POA: Diagnosis not present

## 2018-10-25 DIAGNOSIS — H2513 Age-related nuclear cataract, bilateral: Secondary | ICD-10-CM | POA: Diagnosis not present

## 2018-10-26 DIAGNOSIS — M25552 Pain in left hip: Secondary | ICD-10-CM | POA: Diagnosis not present

## 2018-10-26 DIAGNOSIS — M545 Low back pain: Secondary | ICD-10-CM | POA: Diagnosis not present

## 2018-11-03 DIAGNOSIS — R311 Benign essential microscopic hematuria: Secondary | ICD-10-CM | POA: Diagnosis not present

## 2018-11-10 DIAGNOSIS — E785 Hyperlipidemia, unspecified: Secondary | ICD-10-CM | POA: Diagnosis not present

## 2018-11-10 DIAGNOSIS — E7800 Pure hypercholesterolemia, unspecified: Secondary | ICD-10-CM | POA: Diagnosis not present

## 2018-11-10 DIAGNOSIS — I10 Essential (primary) hypertension: Secondary | ICD-10-CM | POA: Diagnosis not present

## 2018-11-10 DIAGNOSIS — M1990 Unspecified osteoarthritis, unspecified site: Secondary | ICD-10-CM | POA: Diagnosis not present

## 2018-11-16 ENCOUNTER — Encounter (INDEPENDENT_AMBULATORY_CARE_PROVIDER_SITE_OTHER): Payer: Self-pay | Admitting: Family Medicine

## 2018-11-16 ENCOUNTER — Ambulatory Visit (INDEPENDENT_AMBULATORY_CARE_PROVIDER_SITE_OTHER): Payer: PPO | Admitting: Family Medicine

## 2018-11-16 ENCOUNTER — Ambulatory Visit (INDEPENDENT_AMBULATORY_CARE_PROVIDER_SITE_OTHER): Payer: PPO

## 2018-11-16 DIAGNOSIS — M25552 Pain in left hip: Secondary | ICD-10-CM

## 2018-11-16 DIAGNOSIS — Z96642 Presence of left artificial hip joint: Secondary | ICD-10-CM

## 2018-11-16 MED ORDER — METHYLPREDNISOLONE 4 MG PO TBPK: ORAL_TABLET | ORAL | 0 refills | Status: DC

## 2018-11-16 NOTE — Progress Notes (Signed)
Office Visit Note   Patient: Sharon [name]           Date of Birth: 27-Mar-1951           MRN: [id number] Visit Date: 11/16/2018 Requested by: Levin Erp, MD [facility], [address] [place], [place] [address] PCP: Levin Erp, MD  Subjective: Chief Complaint  Patient presents with  . Left Hip - Pain    Pain left upper buttock region, started somewhat last night. A little left groin pain this morning, but it went away. Still a bit numb & tender to touch, lateral aspect of left hip.    HPI: She is a 68 year old with left posterior hip pain.  Symptoms started last week, no injury.  She is having a little bit of groin pain but most of her pain is in the posterior hip.  She is status post anterior approach replacement in October and has been doing well so far.  She comes in today because she is supposed to leave on a trip to [place], [place] and wanted to be sure it was safe to do so.  Previous lumbar MRI scan 1 year ago showed L3-4 stenosis.              ROS: Otherwise noncontributory  Objective: Vital Signs: LMP 02/17/1989   Physical Exam:  Left hip: She is tender in the lower lumbar spine and near the left SI joint.  No significant pain with active hip flexion and internal/external rotation.  Imaging: X-rays left hip: Intact surgical hardware, no sign of loosening.  She does have mild to moderate SI joint DJD.   Assessment & Plan: 1.  Left posterior hip pain, possibly due to sacroiliac dysfunction versus referred pain from lumbar stenosis. -Reassurance regarding her prosthesis.  Medrol Dosepak, muscle relaxant as needed.  If still having pain when she returns, consider physical therapy referral or SI joint injection.   Follow-Up Instructions: No follow-ups on file.      Procedures: No procedures performed  No notes on file    PMFS History: Patient Active Problem List   Diagnosis Date Noted  . Status post total replacement of left hip 07/20/2018  .  Unilateral primary osteoarthritis, left hip 05/25/2018  . Genetic testing 11/14/2016  . Family history of ovarian cancer   . Family history of colon cancer   . Family history of uterine cancer   . Family history of stomach cancer   . Osteoarthritis of left knee 04/03/2015  . S/P total knee replacement using cement 04/03/2015  . Gait abnormality 12/22/2012  . Swelling of ankle 12/22/2012  . Ringworm   . Hypertension   . Fibroid   . Headache(784.0)   . Arthritis   . Vitamin D deficiency    Past Medical History:  Diagnosis Date  . Family history of adverse reaction to anesthesia    "mom did; don't know what happened" (07/20/2018)  . Family history of colon cancer   . Family history of ovarian cancer   . Family history of stomach cancer   . Family history of uterine cancer   . Fibroid   . Heart murmur    "tiny, tiny one" (07/20/2018)  . High cholesterol   . Hypertension   . Migraine    "a few/year; last one was 07/19/2018" (07/20/2018)  . Osteoarthritis    "knees, hips, hands, probably in my back" (07/20/2018)  . Ringworm   . Vitamin D deficiency     Family History  Problem Relation Age of Onset  . Ovarian cancer Mother 10  . Heart disease Maternal Grandfather   . Hypertension Paternal Grandmother   . Ovarian cancer Cousin        maternal first cousin dx in her 24s  . Heart disease Father   . Atrial fibrillation Father   . Heart disease Brother   . Heart disease Brother   . Colon cancer Maternal Aunt        dx in her 17s-60  . Diabetes Paternal Aunt   . Ovarian cancer Maternal Grandmother        dx in her mid 70s  . Cancer Maternal Grandmother        OVARIAN AND UTERINE  . Uterine cancer Maternal Grandmother        dx in her early 38s  . Stomach cancer Maternal Uncle        dx in his 74s-80s  . Stroke Maternal Uncle   . Leukemia Paternal Uncle     Past Surgical History:  Procedure Laterality Date  . APPENDECTOMY  1969  . BUNIONECTOMY Bilateral   . JOINT  REPLACEMENT    . KNEE ARTHROSCOPY Left 11/2013  . Precancerous lesion excised     "arm"  . TONSILLECTOMY    . TOTAL ABDOMINAL HYSTERECTOMY  1990   TAH BSO  . TOTAL HIP ARTHROPLASTY Left 07/20/2018  . TOTAL HIP ARTHROPLASTY Left 07/20/2018   Procedure: LEFT TOTAL HIP ARTHROPLASTY ANTERIOR APPROACH;  Surgeon: Mcarthur Rossetti, MD;  Location: [facility];  Service: Orthopedics;  Laterality: Left;  . TOTAL KNEE ARTHROPLASTY Right 2012  . TOTAL KNEE ARTHROPLASTY Left 04/03/2015   Procedure: TOTAL KNEE ARTHROPLASTY;  Surgeon: Garald Balding, MD;  Location: [facility];  Service: Orthopedics;  Laterality: Left;   Social History   Occupational History  . Not on file  Tobacco Use  . Smoking status: Never Smoker  . Smokeless tobacco: Never Used  Substance and Sexual Activity  . Alcohol use: Yes    Alcohol/week: 1.0 - 2.0 standard drinks    Types: 1 - 2 Standard drinks or equivalent per week  . Drug use: No  . Sexual activity: Not Currently    Partners: Male    Birth control/protection: Surgical, Post-menopausal

## 2018-12-13 DIAGNOSIS — N390 Urinary tract infection, site not specified: Secondary | ICD-10-CM | POA: Diagnosis not present

## 2019-03-07 ENCOUNTER — Ambulatory Visit: Payer: Self-pay | Admitting: Orthopaedic Surgery

## 2019-03-20 ENCOUNTER — Encounter: Payer: Self-pay | Admitting: Radiology

## 2019-03-21 ENCOUNTER — Ambulatory Visit: Payer: PPO | Admitting: Orthopaedic Surgery

## 2019-03-28 ENCOUNTER — Ambulatory Visit (INDEPENDENT_AMBULATORY_CARE_PROVIDER_SITE_OTHER): Payer: PPO | Admitting: Orthopaedic Surgery

## 2019-03-28 ENCOUNTER — Ambulatory Visit (INDEPENDENT_AMBULATORY_CARE_PROVIDER_SITE_OTHER): Payer: PPO

## 2019-03-28 ENCOUNTER — Encounter: Payer: Self-pay | Admitting: Orthopaedic Surgery

## 2019-03-28 ENCOUNTER — Other Ambulatory Visit: Payer: Self-pay

## 2019-03-28 DIAGNOSIS — G8929 Other chronic pain: Secondary | ICD-10-CM

## 2019-03-28 DIAGNOSIS — M25552 Pain in left hip: Secondary | ICD-10-CM

## 2019-03-28 DIAGNOSIS — M545 Low back pain, unspecified: Secondary | ICD-10-CM

## 2019-03-28 DIAGNOSIS — Z96642 Presence of left artificial hip joint: Secondary | ICD-10-CM | POA: Diagnosis not present

## 2019-03-28 DIAGNOSIS — M7062 Trochanteric bursitis, left hip: Secondary | ICD-10-CM

## 2019-03-28 MED ORDER — METHYLPREDNISOLONE 4 MG PO TABS: ORAL_TABLET | ORAL | 0 refills | Status: DC

## 2019-03-28 NOTE — Progress Notes (Signed)
Office Visit Note   Patient: Sharon [name]           Date of Birth: 07-26-1951           MRN: [id number] Visit Date: 03/28/2019              Requested by: Levin Erp, MD [place], [address], [place] [address] PCP: Levin Erp, MD   Assessment & Plan: Visit Diagnoses:  1. History of left hip replacement   2. Chronic left-sided low back pain without sciatica   3. Trochanteric bursitis, left hip     Plan: I do feel that she would benefit from formal physical therapy on the left hip trochanteric area and her low back.  I am fine with her getting back into yoga.  I have recommended a 6-day steroid taper as well as over-the-counter Voltaren gel to try on both her low back and her left trochanteric area.  All question concerns were answered and addressed.  We can see her back in 4 months which will be the 1 year standpoint from surgery.  No x-rays are needed unless there is any issues.  Follow-Up Instructions: Return in about 4 months (around 07/28/2019).   Orders:  Orders Placed This Encounter  Procedures  . XR HIP UNILAT W OR W/O PELVIS 1V LEFT   Meds ordered this encounter  Medications  . methylPREDNISolone (MEDROL) 4 MG tablet    Sig: Medrol dose pack. Take as instructed    Dispense:  21 tablet    Refill:  0      Procedures: No procedures performed   Clinical Data: No additional findings.   Subjective: Chief Complaint  Patient presents with  . Left Hip - Follow-up  The patient is 8 months status post a left total hip arthroplasty.  She does report left hip pain but on the lateral aspect of her pain as well as left-sided low back pain.  She has been doing well overall.  She is anxious to get back into a pool once the coronavirus pandemic allows people to get nipples.  She also wants to get back into yoga.  She denies any groin pain.  She denies any weakness in her legs.  She denies any numbness and tingling.  She is not a diabetic and not a  smoker.  HPI  Review of Systems She currently denies any headache, chest pain, shortness of breath, fever, chills, nausea, vomiting  Objective: Vital Signs: LMP 02/17/1989   Physical Exam She is alert and orient x3 and in no acute distress Ortho Exam Examination of her low back shows some pain that is more facet joint mediated the lower lumbar spine to the left side.  She also has pain over the left trochanteric area to palpation and some IT band pain.  Her range of motion is full and without pain of her left hip.  Her leg lengths are equal. Specialty Comments:  No specialty comments available.  Imaging: Xr Hip Unilat W Or W/o Pelvis 1v Left  Result Date: 03/28/2019 Although AP pelvis and lateral of the left hip shows a well-seated total hip arthroplasty with no complicating features.    PMFS History: Patient Active Problem List   Diagnosis Date Noted  . Status post total replacement of left hip 07/20/2018  . Unilateral primary osteoarthritis, left hip 05/25/2018  . Genetic testing 11/14/2016  . Family history of ovarian cancer   . Family history of colon cancer   . Family  history of uterine cancer   . Family history of stomach cancer   . Osteoarthritis of left knee 04/03/2015  . S/P total knee replacement using cement 04/03/2015  . Gait abnormality 12/22/2012  . Swelling of ankle 12/22/2012  . Ringworm   . Hypertension   . Fibroid   . Headache(784.0)   . Arthritis   . Vitamin D deficiency    Past Medical History:  Diagnosis Date  . Family history of adverse reaction to anesthesia    "mom did; don't know what happened" (07/20/2018)  . Family history of colon cancer   . Family history of ovarian cancer   . Family history of stomach cancer   . Family history of uterine cancer   . Fibroid   . Heart murmur    "tiny, tiny one" (07/20/2018)  . High cholesterol   . Hypertension   . Migraine    "a few/year; last one was 07/19/2018" (07/20/2018)  . Osteoarthritis     "knees, hips, hands, probably in my back" (07/20/2018)  . Ringworm   . Vitamin D deficiency     Family History  Problem Relation Age of Onset  . Ovarian cancer Mother 67  . Heart disease Maternal Grandfather   . Hypertension Paternal Grandmother   . Ovarian cancer Cousin        maternal first cousin dx in her 71s  . Heart disease Father   . Atrial fibrillation Father   . Heart disease Brother   . Heart disease Brother   . Colon cancer Maternal Aunt        dx in her 28s-60  . Diabetes Paternal Aunt   . Ovarian cancer Maternal Grandmother        dx in her mid 79s  . Cancer Maternal Grandmother        OVARIAN AND UTERINE  . Uterine cancer Maternal Grandmother        dx in her early 43s  . Stomach cancer Maternal Uncle        dx in his 16s-80s  . Stroke Maternal Uncle   . Leukemia Paternal Uncle     Past Surgical History:  Procedure Laterality Date  . APPENDECTOMY  1969  . BUNIONECTOMY Bilateral   . JOINT REPLACEMENT    . KNEE ARTHROSCOPY Left 11/2013  . Precancerous lesion excised     "arm"  . TONSILLECTOMY    . TOTAL ABDOMINAL HYSTERECTOMY  1990   TAH BSO  . TOTAL HIP ARTHROPLASTY Left 07/20/2018  . TOTAL HIP ARTHROPLASTY Left 07/20/2018   Procedure: LEFT TOTAL HIP ARTHROPLASTY ANTERIOR APPROACH;  Surgeon: Mcarthur Rossetti, MD;  Location: [facility];  Service: Orthopedics;  Laterality: Left;  . TOTAL KNEE ARTHROPLASTY Right 2012  . TOTAL KNEE ARTHROPLASTY Left 04/03/2015   Procedure: TOTAL KNEE ARTHROPLASTY;  Surgeon: Garald Balding, MD;  Location: [facility];  Service: Orthopedics;  Laterality: Left;   Social History   Occupational History  . Not on file  Tobacco Use  . Smoking status: Never Smoker  . Smokeless tobacco: Never Used  Substance and Sexual Activity  . Alcohol use: Yes    Alcohol/week: 1.0 - 2.0 standard drinks    Types: 1 - 2 Standard drinks or equivalent per week  . Drug use: No  . Sexual activity: Not Currently    Partners: Male    Birth  control/protection: Surgical, Post-menopausal

## 2019-03-31 ENCOUNTER — Other Ambulatory Visit: Payer: Self-pay

## 2019-03-31 ENCOUNTER — Ambulatory Visit: Payer: PPO | Attending: Orthopaedic Surgery | Admitting: Physical Therapy

## 2019-03-31 ENCOUNTER — Encounter: Payer: Self-pay | Admitting: Physical Therapy

## 2019-03-31 DIAGNOSIS — M25652 Stiffness of left hip, not elsewhere classified: Secondary | ICD-10-CM | POA: Insufficient documentation

## 2019-03-31 DIAGNOSIS — M6281 Muscle weakness (generalized): Secondary | ICD-10-CM | POA: Insufficient documentation

## 2019-03-31 DIAGNOSIS — M25552 Pain in left hip: Secondary | ICD-10-CM | POA: Diagnosis not present

## 2019-03-31 NOTE — Patient Instructions (Signed)
Access Code: [email or web address]  URL: [email or web address]  Date: 03/31/2019  Prepared by: Jari Favre   Exercises  Supine Figure 4 Piriformis Stretch - 3 reps - 1 sets - 30 sec hold - 1x daily - 7x weekly  Hooklying Small March - 10 reps - 2 sets - 1x daily - 7x weekly  Ball squeeze with Kegel - 10 reps - 1 sets - 3 sec hold - 3x daily - 7x weekly

## 2019-03-31 NOTE — Therapy (Signed)
[facility] [address] [place], [place], [address] Phone: [phone]   Fax:  [phone]  Physical Therapy Evaluation  Patient Details  Name: Sharon [name] MRN: [id number] Date of Birth: 1951-09-12 Referring Provider (PT): Mcarthur Rossetti, MD   Encounter Date: 03/31/2019  PT End of Session - 03/31/19 1949    Visit Number  1    Date for PT Re-Evaluation  05/26/19    Authorization Type  [employer]    PT Start Time  1900    PT Stop Time  1942    PT Time Calculation (min)  42 min    Activity Tolerance  Patient tolerated treatment well    Behavior During Therapy  [facility] for tasks assessed/performed       Past Medical History:  Diagnosis Date  . Family history of adverse reaction to anesthesia    "mom did; don't know what happened" (07/20/2018)  . Family history of colon cancer   . Family history of ovarian cancer   . Family history of stomach cancer   . Family history of uterine cancer   . Fibroid   . Heart murmur    "tiny, tiny one" (07/20/2018)  . High cholesterol   . Hypertension   . Migraine    "a few/year; last one was 07/19/2018" (07/20/2018)  . Osteoarthritis    "knees, hips, hands, probably in my back" (07/20/2018)  . Ringworm   . Vitamin D deficiency     Past Surgical History:  Procedure Laterality Date  . APPENDECTOMY  1969  . BUNIONECTOMY Bilateral   . JOINT REPLACEMENT    . KNEE ARTHROSCOPY Left 11/2013  . Precancerous lesion excised     "arm"  . TONSILLECTOMY    . TOTAL ABDOMINAL HYSTERECTOMY  1990   TAH BSO  . TOTAL HIP ARTHROPLASTY Left 07/20/2018  . TOTAL HIP ARTHROPLASTY Left 07/20/2018   Procedure: LEFT TOTAL HIP ARTHROPLASTY ANTERIOR APPROACH;  Surgeon: Mcarthur Rossetti, MD;  Location: [facility];  Service: Orthopedics;  Laterality: Left;  . TOTAL KNEE ARTHROPLASTY Right 2012  . TOTAL KNEE ARTHROPLASTY Left 04/03/2015   Procedure: TOTAL KNEE ARTHROPLASTY;   Surgeon: Garald Balding, MD;  Location: [facility];  Service: Orthopedics;  Laterality: Left;    There were no vitals filed for this visit.   Subjective Assessment - 03/31/19 1910    Subjective  Pt states she had hip replacement in October last year.  Now it is a different pain.  Pt states the doctor felt bursitis and the pain comes and goes.  Pt states she got imaging of the SI joint and it showed arthritis on the left SI joint. I have been walking more and not able to do water aerobics.    Pertinent History  Lt THA    Limitations  Walking    How long can you walk comfortably?  walk for 30 minutes per day    Diagnostic tests  x-ray for SI joint    Patient Stated Goals  Get exercises that prevent the hip from hurting    Currently in Pain?  Yes    Pain Score  4     Pain Location  Hip    Pain Orientation  Left    Pain Descriptors / Indicators  Aching;Tender    Pain Type  Acute pain    Pain Onset  More than a month ago    Pain Frequency  Intermittent    Aggravating Factors  not sure, sometimes worse at the end of the day    Pain Relieving Factors  advil    Effect of Pain on Daily Activities  just pain with activities    Multiple Pain Sites  No         [facility] PT Assessment - 03/31/19 0001      Assessment   Medical Diagnosis  M25.552 (ICD-10-CM) - Pain in left hip    Referring Provider (PT)  Mcarthur Rossetti, MD    Onset Date/Surgical Date  --   2 months ago   Prior Therapy  No      Precautions   Precautions  None      Restrictions   Weight Bearing Restrictions  No      Balance Screen   Has the patient fallen in the past 6 months  No      [place] residence    Living Arrangements  Spouse/significant other      Prior Function   Level of [place]  Retired      Associate Professor   Overall Cognitive Status  Within Functional Limits for tasks assessed      Observation/Other Assessments   Focus on  Therapeutic Outcomes (FOTO)   20% limited      Posture/Postural Control   Posture/Postural Control  Postural limitations    Postural Limitations  Rounded Shoulders;Left pelvic obliquity      ROM / Strength   AROM / PROM / Strength  AROM;PROM;Strength      AROM   AROM Assessment Site  Lumbar    Lumbar Flexion  WFL    Lumbar Extension  WFL   some tightness   Lumbar - Right Side Bend  WFL   some left hip/SI pain   Lumbar - Left Side Bend  WFL   some left hip/SI pain     PROM   Overall PROM Comments  hip ER limited 50% on Lt side; 20% on Rt side      Strength   Overall Strength Comments  core weakness demonstrated by pelvic instability with SLR - improved with TrA assistance, lumbar multifidi and glute assist    Strength Assessment Site  Hip    Right/Left Hip  Right;Left    Right Hip Flexion  4+/5    Right Hip Extension  4+/5    Right Hip ABduction  4+/5    Right Hip ADduction  4-/5    Left Hip Flexion  4+/5    Left Hip Extension  5/5    Left Hip ABduction  4+/5    Left Hip ADduction  4-/5      Flexibility   Soft Tissue Assessment /Muscle Length  yes    Hamstrings  WNL      Palpation   Palpation comment  left Greater trochanteric bursea TTP, left glutes sacral attachment TTP, left SI joint TTP      Special Tests    Special Tests  Hip Special Tests    Hip Special Tests   SI Compression;Ober's Test      Ober's Test   Findings  Positive    Side  Left    Comments  IT band tight and increased pain a little      SI Compression   Comments  providing stability to pelvis made active SLR more easy      Ambulation/Gait   Gait Pattern  Trendelenburg  Objective measurements completed on examination: See above findings.      [place] Adult PT Treatment/Exercise - 03/31/19 0001      Self-Care   Self-Care  Other Self-Care Comments    Other Self-Care Comments   educated and performed initial HEP             PT Education - 03/31/19 1957     Education Details  Access Code: [email or web address]    Person(s) Educated  Patient    Methods  Explanation;Demonstration;Tactile cues;Verbal cues;Handout    Comprehension  Verbalized understanding;Returned demonstration       PT Short Term Goals - 03/31/19 1954      PT SHORT TERM GOAL #1   Title  independent with initial HEP    Time  4    Period  Weeks    Status  New    Target Date  04/28/19      PT SHORT TERM GOAL #2   Title  Pt will report 30% reduction of pain    Time  4    Period  Weeks    Status  New    Target Date  04/28/19        PT Long Term Goals - 03/31/19 1950      PT LONG TERM GOAL #1   Title  Pt will be independent and safe with advanced HEP    Time  8    Period  Weeks    Status  New    Target Date  05/26/19      PT LONG TERM GOAL #2   Title  Pt will report 2/10 pain at most due to improved core strength and stability    Time  8    Period  Weeks    Status  New    Target Date  05/26/19      PT LONG TERM GOAL #3   Title  Pt will demonsrate hip abdcution and adduction strength of 5/5 MMT for improved pelvic stability with walking    Time  8    Period  Weeks    Status  New    Target Date  05/26/19      PT LONG TERM GOAL #4   Title  Pt will report < or = to 18% limited FOTO for improved function    Time  8    Period  Weeks    Status  New    Target Date  05/26/19             Plan - 03/31/19 2008    Clinical Impression Statement  Pt presents to skilled PT with left hip/SI joint pain.  She has hip weakness and decreased ROM as mentioned above.  Pt has core, gluteal and lumbar weakness that appears to be the main issue causing her hip pain.  She used to go to the pool a lot and has now just been walking and doing chair yoga videos.  She has increased anterior rotation of the left innominate.  Pt will benefit from skilled PT to address impairments so she can stay active and healthy without increased pain or injuries.    Personal Factors and Comorbidities   Comorbidity 2    Comorbidities  left THA, arthritis in SI joint    Stability/Clinical Decision Making  Stable/Uncomplicated    Clinical Decision Making  Low    Rehab Potential  Excellent    PT Frequency  2x / week    PT Duration  8 weeks  PT Treatment/Interventions  ADLs/Self Care Home Management;Cryotherapy;Electrical Stimulation;Iontophoresis 4mg /ml Dexamethasone;Moist Heat;Therapeutic activities;Therapeutic exercise;Traction;Neuromuscular re-education;Patient/family education;Manual techniques;Taping;Dry needling;Passive range of motion    PT Next Visit Plan  hip ER stretches, TrA and gluteal strengthening, check on orders for ionto    PT Home Exercise Plan  Access Code: [email or web address]    Consulted and Agree with Plan of Care  Patient       Patient will benefit from skilled therapeutic intervention in order to improve the following deficits and impairments:  Abnormal gait, Increased muscle spasms, Decreased range of motion, Decreased strength, Pain  Visit Diagnosis: Pain in left hip  Stiffness of left hip, not elsewhere classified  Muscle weakness (generalized)     Problem List Patient Active Problem List   Diagnosis Date Noted  . Status post total replacement of left hip 07/20/2018  . Unilateral primary osteoarthritis, left hip 05/25/2018  . Genetic testing 11/14/2016  . Family history of ovarian cancer   . Family history of colon cancer   . Family history of uterine cancer   . Family history of stomach cancer   . Osteoarthritis of left knee 04/03/2015  . S/P total knee replacement using cement 04/03/2015  . Gait abnormality 12/22/2012  . Swelling of ankle 12/22/2012  . Ringworm   . Hypertension   . Fibroid   . Headache(784.0)   . Arthritis   . Vitamin D deficiency     Jule Ser, PT 03/31/2019, 8:15 PM  [facility] [address], [place], [place], [address] Phone: [phone]   Fax:   [phone]  Name: ALICA SHELLHAMMER MRN: [id number] Date of Birth: [birth year]

## 2019-04-01 ENCOUNTER — Telehealth: Payer: Self-pay | Admitting: Orthopaedic Surgery

## 2019-04-01 NOTE — Telephone Encounter (Signed)
See below

## 2019-04-01 NOTE — Telephone Encounter (Signed)
I'm fine with holding oral prednisone for PT to do their thing.

## 2019-04-01 NOTE — Telephone Encounter (Signed)
Pt states the therapist would like for her to do the steroid patches instead of the Prednisone. Pt request a call back from Dr Ninfa Linden to discuss.

## 2019-04-01 NOTE — Telephone Encounter (Signed)
She's actually talking about ionophoresis Therapy told her that they couldn't do it if she was on prednisone She states she hasn't started the prednisone yet

## 2019-04-01 NOTE — Telephone Encounter (Signed)
I don't know anything about steroid patches.  Is this something therapy does?  I have never prescribed steroid patches and am not comfortable doing so never having placed patients on a steroid patch.

## 2019-04-04 ENCOUNTER — Ambulatory Visit: Payer: PPO | Admitting: Physical Therapy

## 2019-04-04 ENCOUNTER — Other Ambulatory Visit: Payer: Self-pay

## 2019-04-04 DIAGNOSIS — M6281 Muscle weakness (generalized): Secondary | ICD-10-CM

## 2019-04-04 DIAGNOSIS — M25552 Pain in left hip: Secondary | ICD-10-CM | POA: Diagnosis not present

## 2019-04-04 DIAGNOSIS — M25652 Stiffness of left hip, not elsewhere classified: Secondary | ICD-10-CM

## 2019-04-04 NOTE — Telephone Encounter (Signed)
Patient aware of the below message  

## 2019-04-04 NOTE — Patient Instructions (Signed)
Access Code: [email or web address]  URL: [email or web address]  Date: 04/04/2019  Prepared by: Ruben Im   Exercises  Supine Figure 4 Piriformis Stretch - 3 reps - 1 sets - 30 sec hold - 1x daily - 7x weekly  Hooklying Small March - 10 reps - 2 sets - 1x daily - 7x weekly  Ball squeeze with Kegel - 10 reps - 1 sets - 3 sec hold - 3x daily - 7x weekly  Clamshell - 10 reps - 1 sets - 1x daily - 7x weekly  Sit to Stand - 10 reps - 1 sets - 1x daily - 7x weekly  Isometric Gluteus Medius at Wall - 10 reps - 1 sets - 1x daily - 7x weekly     IONTOPHORESIS PATIENT PRECAUTIONS & CONTRAINDICATIONS:  . Redness under one or both electrodes can occur.  This characterized by a uniform redness that usually disappears within 12 hours of treatment. . Small pinhead size blisters may result in response to the drug.  Contact your physician if the problem persists more than 24 hours. . On rare occasions, iontophoresis therapy can result in temporary skin reactions such as rash, inflammation, irritation or burns.  The skin reactions may be the result of individual sensitivity to the ionic solution used, the condition of the skin at the start of treatment, reaction to the materials in the electrodes, allergies or sensitivity to dexamethasone, or a poor connection between the patch and your skin.  Discontinue using iontophoresis if you have any of these reactions and report to your therapist. . Remove the Patch or electrodes if you have any undue sensation of pain or burning during the treatment and report discomfort to your therapist. . Tell your Therapist if you have had known adverse reactions to the application of electrical current. . If using the Patch, the LED light will turn off when treatment is complete and the patch can be removed.  Approximate treatment time is 1-3 hours.  Remove the patch when light goes off or after 6 hours. . The Patch can be worn during normal activity, however excessive  motion where the electrodes have been placed can cause poor contact between the skin and the electrode or uneven electrical current resulting in greater risk of skin irritation. Marland Kitchen Keep out of the reach of children.   . DO NOT use if you have a cardiac pacemaker or any other electrically sensitive implanted device. . DO NOT use if you have a known sensitivity to dexamethasone. . DO NOT use during Magnetic Resonance Imaging (MRI). . DO NOT use over broken or compromised skin (e.g. sunburn, cuts, or acne) due to the increased risk of skin reaction. . DO NOT SHAVE over the area to be treated:  To establish good contact between the Patch and the skin, excessive hair may be clipped. . DO NOT place the Patch or electrodes on or over your eyes, directly over your heart, or brain. . DO NOT reuse the Patch or electrodes as this may cause burns to occur.    Trigger Point Dry Needling  . What is Trigger Point Dry Needling (DN)? o DN is a physical therapy technique used to treat muscle pain and dysfunction. Specifically, DN helps deactivate muscle trigger points (muscle knots).  o A thin filiform needle is used to penetrate the skin and stimulate the underlying trigger point. The goal is for a local twitch response (LTR) to occur and for the trigger point to relax. No medication of any kind is injected during  the procedure.   . What Does Trigger Point Dry Needling Feel Like?  o The procedure feels different for each individual patient. Some patients report that they do not actually feel the needle enter the skin and overall the process is not painful. Very mild bleeding may occur. However, many patients feel a deep cramping in the muscle in which the needle was inserted. This is the local twitch response.   Marland Kitchen How Will I feel after the treatment? o Soreness is normal, and the onset of soreness may not occur for a few hours. Typically this soreness does not last longer than two days.  o Bruising is  uncommon, however; ice can be used to decrease any possible bruising.  o In rare cases feeling tired or nauseous after the treatment is normal. In addition, your symptoms may get worse before they get better, this period will typically not last longer than 24 hours.   . What Can I do After My Treatment? o Increase your hydration by drinking more water for the next 24 hours. o You may place ice or heat on the areas treated that have become sore, however, do not use heat on inflamed or bruised areas. Heat often brings more relief post needling. o You can continue your regular activities, but vigorous activity is not recommended initially after the treatment for 24 hours. o DN is best combined with other physical therapy such as strengthening, stretching, and other therapies.     Ruben Im PT [facility] [address] [place], [place] [address] Phone # [phone] Fax [phone]

## 2019-04-04 NOTE — Therapy (Signed)
[facility] [address], [place], [place], [address] Phone: [phone]   Fax:  [phone]  Physical Therapy Treatment  Patient Details  Name: Sharon [name] MRN: [id number] Date of Birth: [birth year] Referring Provider (PT): Mcarthur Rossetti, MD   Encounter Date: 04/04/2019  PT End of Session - 04/04/19 1032    Visit Number  2    Date for PT Re-Evaluation  05/26/19    Authorization Type  [employer]    PT Start Time  0930    PT Stop Time  1020    PT Time Calculation (min)  50 min    Activity Tolerance  Patient tolerated treatment well       Past Medical History:  Diagnosis Date  . Family history of adverse reaction to anesthesia    "mom did; don't know what happened" (07/20/2018)  . Family history of colon cancer   . Family history of ovarian cancer   . Family history of stomach cancer   . Family history of uterine cancer   . Fibroid   . Heart murmur    "tiny, tiny one" (07/20/2018)  . High cholesterol   . Hypertension   . Migraine    "a few/year; last one was 07/19/2018" (07/20/2018)  . Osteoarthritis    "knees, hips, hands, probably in my back" (07/20/2018)  . Ringworm   . Vitamin D deficiency     Past Surgical History:  Procedure Laterality Date  . APPENDECTOMY  1969  . BUNIONECTOMY Bilateral   . JOINT REPLACEMENT    . KNEE ARTHROSCOPY Left 11/2013  . Precancerous lesion excised     "arm"  . TONSILLECTOMY    . TOTAL ABDOMINAL HYSTERECTOMY  1990   TAH BSO  . TOTAL HIP ARTHROPLASTY Left 07/20/2018  . TOTAL HIP ARTHROPLASTY Left 07/20/2018   Procedure: LEFT TOTAL HIP ARTHROPLASTY ANTERIOR APPROACH;  Surgeon: Mcarthur Rossetti, MD;  Location: [place];  Service: Orthopedics;  Laterality: Left;  . TOTAL KNEE ARTHROPLASTY Right 2012  . TOTAL KNEE ARTHROPLASTY Left 04/03/2015   Procedure: TOTAL KNEE ARTHROPLASTY;  Surgeon: Garald Balding, MD;  Location: [place];  Service:  Orthopedics;  Laterality: Left;    There were no vitals filed for this visit.  Subjective Assessment - 04/04/19 0930    Subjective  A little bit sore.  I find the floor is too uncomfortable to do the ex's so I've moved to my bed.  Patient states she plans on not taking the steroid the doctor gave her and wants to try ionto patch instead.    Pertinent History  Lt THA    How long can you walk comfortably?  walk for 30 minutes per day    Diagnostic tests  x-ray for SI joint    Patient Stated Goals  Get exercises that prevent the hip from hurting    Currently in Pain?  Yes    Pain Score  2     Pain Location  Hip    Pain Orientation  Left;Lateral    Pain Type  Chronic pain                       [facility] Adult PT Treatment/Exercise - 04/04/19 0001      Knee/Hip Exercises: Stretches   Piriformis Stretch  3 reps;30 seconds    Piriformis Stretch Limitations  supine per initial HEP      Knee/Hip Exercises: Standing   Other Standing Knee Exercises  stand tall glute med activation 10x right/left      Knee/Hip Exercises: Seated   Sit to Sand  10 reps;without UE support   higher table     Knee/Hip Exercises: Supine   Other Supine Knee/Hip Exercises  verbal review from initial HEP of ball squeeze and hooklying march       Knee/Hip Exercises: Sidelying   Clams  10x      Moist Heat Therapy   Number Minutes Moist Heat  5 Minutes    Moist Heat Location  Hip      Iontophoresis   Type of Iontophoresis  Dexamethasone    Location  left hip lateral    Dose  4 mg/ml     Time  4-6 hour patch       Manual Therapy   Soft tissue mobilization  left gluteals, piriformis, lateral quads, ITB and lateral HS       Trigger Point Dry Needling - 04/04/19 0001    Consent Given?  Yes    Muscles Treated Back/Hip  Gluteus minimus;Gluteus medius;Gluteus maximus;Piriformis;Tensor fascia lata    Dry Needling Comments  left     Gluteus Minimus Response  Palpable increased muscle length     Gluteus Medius Response  Palpable increased muscle length    Gluteus Maximus Response  Palpable increased muscle length    Piriformis Response  Palpable increased muscle length    Tensor Fascia Lata Response  Palpable increased muscle length           PT Education - 04/04/19 1014    Education Details  Access Code: [email or web address]  clams, stand tall ex, sit to stand no hands;  ionto info; dry needling after care    Person(s) Educated  Patient    Methods  Explanation;Demonstration;Handout    Comprehension  Returned demonstration;Verbalized understanding       PT Short Term Goals - 03/31/19 1954      PT SHORT TERM GOAL #1   Title  independent with initial HEP    Time  4    Period  Weeks    Status  New    Target Date  04/28/19      PT SHORT TERM GOAL #2   Title  Pt will report 30% reduction of pain    Time  4    Period  Weeks    Status  New    Target Date  04/28/19        PT Long Term Goals - 03/31/19 1950      PT LONG TERM GOAL #1   Title  Pt will be independent and safe with advanced HEP    Time  8    Period  Weeks    Status  New    Target Date  05/26/19      PT LONG TERM GOAL #2   Title  Pt will report 2/10 pain at most due to improved core strength and stability    Time  8    Period  Weeks    Status  New    Target Date  05/26/19      PT LONG TERM GOAL #3   Title  Pt will demonsrate hip abdcution and adduction strength of 5/5 MMT for improved pelvic stability with walking    Time  8    Period  Weeks    Status  New    Target Date  05/26/19      PT LONG TERM GOAL #4   Title  Pt will report < or = to 18% limited FOTO for improved function    Time  8    Period  Weeks    Status  New    Target Date  05/26/19            Plan - 04/04/19 0932    Clinical Impression Statement  The patient demonstrates good compliance with initial HEP.  Tender points in gluteals and piriformis identified and patient expresses interest in dry needling in which she is familiar.   Much improved soft tissue length and mobility following DN and manual therapy.  Able to perform a progression of hip ex's without pain exacerbation.  Therapist closely monitoring response with all treatment interventions.    Comorbidities  left THA, arthritis in SI joint    PT Frequency  2x / week    PT Duration  8 weeks    PT Treatment/Interventions  ADLs/Self Care Home Management;Cryotherapy;Electrical Stimulation;Iontophoresis 4mg /ml Dexamethasone;Moist Heat;Therapeutic activities;Therapeutic exercise;Traction;Neuromuscular re-education;Patient/family education;Manual techniques;Taping;Dry needling;Passive range of motion    PT Next Visit Plan  assess response to DN #1 and ionto #1;   TrA and gluteal strengthening;  add band to clams    PT Home Exercise Plan  Access Code: [email or web address]       Patient will benefit from skilled therapeutic intervention in order to improve the following deficits and impairments:  Abnormal gait, Increased muscle spasms, Decreased range of motion, Decreased strength, Pain  Visit Diagnosis: Pain in left hip  Stiffness of left hip, not elsewhere classified  Muscle weakness (generalized)     Problem List Patient Active Problem List   Diagnosis Date Noted  . Status post total replacement of left hip 07/20/2018  . Unilateral primary osteoarthritis, left hip 05/25/2018  . Genetic testing 11/14/2016  . Family history of ovarian cancer   . Family history of colon cancer   . Family history of uterine cancer   . Family history of stomach cancer   . Osteoarthritis of left knee 04/03/2015  . S/P total knee replacement using cement 04/03/2015  . Gait abnormality 12/22/2012  . Swelling of ankle 12/22/2012  . Ringworm   . Hypertension   . Fibroid   . Headache(784.0)   . Arthritis   . Vitamin D deficiency    Ruben Im, PT 04/04/19 11:38 AM Phone: [phone] Fax: [phone] Alvera Singh 04/04/2019, 11:38 AM  [facility] [address], [place], [place], [address] Phone: [phone]   Fax:  [phone]  Name: Sharon [name] MRN: [id number] Date of Birth: [birth year]

## 2019-04-06 ENCOUNTER — Encounter

## 2019-04-07 ENCOUNTER — Encounter: Payer: Self-pay | Admitting: Physical Therapy

## 2019-04-07 ENCOUNTER — Ambulatory Visit: Payer: PPO | Admitting: Physical Therapy

## 2019-04-07 ENCOUNTER — Other Ambulatory Visit: Payer: Self-pay

## 2019-04-07 DIAGNOSIS — M25652 Stiffness of left hip, not elsewhere classified: Secondary | ICD-10-CM

## 2019-04-07 DIAGNOSIS — M6281 Muscle weakness (generalized): Secondary | ICD-10-CM

## 2019-04-07 DIAGNOSIS — M25552 Pain in left hip: Secondary | ICD-10-CM | POA: Diagnosis not present

## 2019-04-07 NOTE — Therapy (Signed)
[facility] [address] [place], [place], [address] Phone: [phone]   Fax:  [phone]  Physical Therapy Treatment  Patient Details  Name: Sharon [name] MRN: [id number] Date of Birth: 1951/03/22 Referring Provider (PT): Mcarthur Rossetti, MD   Encounter Date: 04/07/2019  PT End of Session - 04/07/19 1322    Visit Number  3    Date for PT Re-Evaluation  05/26/19    Authorization Type  [employer]    PT Start Time  1230    PT Stop Time  1315    PT Time Calculation (min)  45 min    Activity Tolerance  Patient tolerated treatment well    Behavior During Therapy  [facility] for tasks assessed/performed       Past Medical History:  Diagnosis Date  . Family history of adverse reaction to anesthesia    "mom did; don't know what happened" (07/20/2018)  . Family history of colon cancer   . Family history of ovarian cancer   . Family history of stomach cancer   . Family history of uterine cancer   . Fibroid   . Heart murmur    "tiny, tiny one" (07/20/2018)  . High cholesterol   . Hypertension   . Migraine    "a few/year; last one was 07/19/2018" (07/20/2018)  . Osteoarthritis    "knees, hips, hands, probably in my back" (07/20/2018)  . Ringworm   . Vitamin D deficiency     Past Surgical History:  Procedure Laterality Date  . APPENDECTOMY  1969  . BUNIONECTOMY Bilateral   . JOINT REPLACEMENT    . KNEE ARTHROSCOPY Left 11/2013  . Precancerous lesion excised     "arm"  . TONSILLECTOMY    . TOTAL ABDOMINAL HYSTERECTOMY  1990   TAH BSO  . TOTAL HIP ARTHROPLASTY Left 07/20/2018  . TOTAL HIP ARTHROPLASTY Left 07/20/2018   Procedure: LEFT TOTAL HIP ARTHROPLASTY ANTERIOR APPROACH;  Surgeon: Mcarthur Rossetti, MD;  Location: [place];  Service: Orthopedics;  Laterality: Left;  . TOTAL KNEE ARTHROPLASTY Right 2012  . TOTAL KNEE ARTHROPLASTY Left 04/03/2015   Procedure: TOTAL KNEE ARTHROPLASTY;   Surgeon: Garald Balding, MD;  Location: [place];  Service: Orthopedics;  Laterality: Left;    There were no vitals filed for this visit.  Subjective Assessment - 04/07/19 1234    Subjective  Pt states the combo of DN and ionto have helped.  Maybe 20% relief.  I get sore with the exercises. A little sore today and points to lateral pelvis.    Pertinent History  Lt THA    How long can you walk comfortably?  walk for 30 minutes per day    Diagnostic tests  x-ray for SI joint    Patient Stated Goals  Get exercises that prevent the hip from hurting    Currently in Pain?  Yes    Pain Score  2     Pain Location  Hip    Pain Orientation  Left;Lateral    Pain Descriptors / Indicators  Sore    Pain Type  Chronic pain    Pain Onset  More than a month ago                       [facility] Adult PT Treatment/Exercise - 04/07/19 0001      Knee/Hip Exercises: Stretches   Piriformis Stretch  Both;30 seconds    Piriformis Stretch Limitations  supine  figure 4    Other Knee/Hip Stretches  hip adductor with strap bil x 30 sec, added to HEP      Knee/Hip Exercises: Aerobic   Nustep  6'   PT present to discuss response to last visit     Knee/Hip Exercises: Standing   Other Standing Knee Exercises  review: stand tall glute med, added stand tall with foot on 6" step 1x20 sec bil      Knee/Hip Exercises: Seated   Sit to Sand  15 reps   from black foam pad, with mirror for knee alignment, no hand     Knee/Hip Exercises: Supine   Hip Adduction Isometric  Strengthening;5 reps;Both    Hip Adduction Isometric Limitations  ball squeeze 5x5 sec with PF cueing by PT    Constance Haw with Greig Right  Strengthening;Both;10 reps    Other Supine Knee/Hip Exercises  supine hooklying march with TrA and PF cueing x 30 marches total      Knee/Hip Exercises: Sidelying   Clams  10x bil with red band (added to HEP)   VCs to engage PF and TrA and stack hips during clams     Iontophoresis   Type of  Iontophoresis  Dexamethasone    Location  Lt SI joint line    Dose  4 mg/ml     Time  4-6 hour patch              PT Education - 04/07/19 1317    Education Details  Access Code: [email or web address]    Person(s) Educated  Patient    Methods  Explanation;Demonstration;Verbal cues;Handout    Comprehension  Verbalized understanding;Returned demonstration       PT Short Term Goals - 04/07/19 1326      PT SHORT TERM GOAL #1   Title  independent with initial HEP    Status  Achieved      PT SHORT TERM GOAL #2   Title  Pt will report 30% reduction of pain    Status  On-going        PT Long Term Goals - 03/31/19 1950      PT LONG TERM GOAL #1   Title  Pt will be independent and safe with advanced HEP    Time  8    Period  Weeks    Status  New    Target Date  05/26/19      PT LONG TERM GOAL #2   Title  Pt will report 2/10 pain at most due to improved core strength and stability    Time  8    Period  Weeks    Status  New    Target Date  05/26/19      PT LONG TERM GOAL #3   Title  Pt will demonsrate hip abdcution and adduction strength of 5/5 MMT for improved pelvic stability with walking    Time  8    Period  Weeks    Status  New    Target Date  05/26/19      PT LONG TERM GOAL #4   Title  Pt will report < or = to 18% limited FOTO for improved function    Time  8    Period  Weeks    Status  New    Target Date  05/26/19            Plan - 04/07/19 1322    Clinical Impression Statement  Pt reports 20% improvement since starting PT.  She got relief from DN and ionto patch last visit.  PT focused on further cueing of core and pelvic floor during supine march and sidelying clamshells and added resistance to clams today.  PT used mirror during sit to stand for feedback to avoid genu valgum bil.  Pt with improving awareness of glute med activation in closed chain with good demo of "stand tall" today.  Second ionto patch placed over Lt SI joint today.  Pt will continue to  benefit from skilled PT to address pain and deficits along POC.    Comorbidities  left THA, arthritis in SI joint    PT Frequency  2x / week    PT Duration  8 weeks    PT Treatment/Interventions  ADLs/Self Care Home Management;Cryotherapy;Electrical Stimulation;Iontophoresis 4mg /ml Dexamethasone;Moist Heat;Therapeutic activities;Therapeutic exercise;Traction;Neuromuscular re-education;Patient/family education;Manual techniques;Taping;Dry needling;Passive range of motion    PT Next Visit Plan  assess response to updates to HEP and ionto #2, continue glute and core strength, flexibility    PT Home Exercise Plan  Access Code: [email or web address]       Patient will benefit from skilled therapeutic intervention in order to improve the following deficits and impairments:  Abnormal gait, Increased muscle spasms, Decreased range of motion, Decreased strength, Pain  Visit Diagnosis: 1. Pain in left hip   2. Stiffness of left hip, not elsewhere classified   3. Muscle weakness (generalized)        Problem List Patient Active Problem List   Diagnosis Date Noted  . Status post total replacement of left hip 07/20/2018  . Unilateral primary osteoarthritis, left hip 05/25/2018  . Genetic testing 11/14/2016  . Family history of ovarian cancer   . Family history of colon cancer   . Family history of uterine cancer   . Family history of stomach cancer   . Osteoarthritis of left knee 04/03/2015  . S/P total knee replacement using cement 04/03/2015  . Gait abnormality 12/22/2012  . Swelling of ankle 12/22/2012  . Ringworm   . Hypertension   . Fibroid   . Headache(784.0)   . Arthritis   . Vitamin D deficiency     Baruch Merl, PT 04/07/19 1:27 PM   [facility] [address], [place], [place], [address] Phone: [phone]   Fax:  [phone]  Name: Sharon [name] MRN: [id number] Date of Birth: 08/07/1951

## 2019-04-07 NOTE — Patient Instructions (Signed)
Access Code: [email or web address]  URL: [email or web address]  Date: 04/07/2019  Prepared by: Venetia Night Sanam Marmo   Exercises  Supine Figure 4 Piriformis Stretch - 3 reps - 1 sets - 30 sec hold - 1x daily - 7x weekly  Hooklying Small March - 10 reps - 2 sets - 1x daily - 7x weekly  Ball squeeze with Kegel - 10 reps - 1 sets - 3 sec hold - 3x daily - 7x weekly  Clamshell - 10 reps - 1 sets - 1x daily - 7x weekly  Clamshell with Resistance - 10 reps - 1 sets - 1x daily - 7x weekly  Sit to Stand - 10 reps - 1 sets - 1x daily - 7x weekly  Isometric Gluteus Medius at Wall - 10 reps - 1 sets - 1x daily - 7x weekly  Hip Adductors and Hamstring Stretch with Strap - 3 reps - 2 sets - 30 hold - 1x daily - 7x weekly

## 2019-04-12 ENCOUNTER — Ambulatory Visit: Payer: PPO | Admitting: Physical Therapy

## 2019-04-12 ENCOUNTER — Other Ambulatory Visit: Payer: Self-pay

## 2019-04-12 ENCOUNTER — Encounter: Payer: Self-pay | Admitting: Physical Therapy

## 2019-04-12 DIAGNOSIS — M25552 Pain in left hip: Secondary | ICD-10-CM

## 2019-04-12 DIAGNOSIS — M25652 Stiffness of left hip, not elsewhere classified: Secondary | ICD-10-CM

## 2019-04-12 DIAGNOSIS — M6281 Muscle weakness (generalized): Secondary | ICD-10-CM

## 2019-04-12 NOTE — Therapy (Signed)
[facility] [address], [place], [place], [address] Phone: [phone]   Fax:  [phone]  Physical Therapy Treatment  Patient Details  Name: Sharon [name] MRN: [id number] Date of Birth: 19-Apr-1951 Referring Provider (PT): Mcarthur Rossetti, MD   Encounter Date: 04/12/2019  PT End of Session - 04/12/19 1623    Visit Number  4    Date for PT Re-Evaluation  05/26/19    Authorization Type  [employer]    PT Start Time  1530    PT Stop Time  1610    PT Time Calculation (min)  40 min    Activity Tolerance  Patient tolerated treatment well    Behavior During Therapy  [facility] for tasks assessed/performed       Past Medical History:  Diagnosis Date  . Family history of adverse reaction to anesthesia    "mom did; don't know what happened" (07/20/2018)  . Family history of colon cancer   . Family history of ovarian cancer   . Family history of stomach cancer   . Family history of uterine cancer   . Fibroid   . Heart murmur    "tiny, tiny one" (07/20/2018)  . High cholesterol   . Hypertension   . Migraine    "a few/year; last one was 07/19/2018" (07/20/2018)  . Osteoarthritis    "knees, hips, hands, probably in my back" (07/20/2018)  . Ringworm   . Vitamin D deficiency     Past Surgical History:  Procedure Laterality Date  . APPENDECTOMY  1969  . BUNIONECTOMY Bilateral   . JOINT REPLACEMENT    . KNEE ARTHROSCOPY Left 11/2013  . Precancerous lesion excised     "arm"  . TONSILLECTOMY    . TOTAL ABDOMINAL HYSTERECTOMY  1990   TAH BSO  . TOTAL HIP ARTHROPLASTY Left 07/20/2018  . TOTAL HIP ARTHROPLASTY Left 07/20/2018   Procedure: LEFT TOTAL HIP ARTHROPLASTY ANTERIOR APPROACH;  Surgeon: Mcarthur Rossetti, MD;  Location: [place];  Service: Orthopedics;  Laterality: Left;  . TOTAL KNEE ARTHROPLASTY Right 2012  . TOTAL KNEE ARTHROPLASTY Left 04/03/2015   Procedure: TOTAL KNEE ARTHROPLASTY;   Surgeon: Garald Balding, MD;  Location: [place];  Service: Orthopedics;  Laterality: Left;    There were no vitals filed for this visit.  Subjective Assessment - 04/12/19 1623    Subjective  Pt states she is pretty much pain free, it just "feels different"    Currently in Pain?  Yes    Pain Score  1     Pain Location  Hip    Pain Orientation  Left;Lateral    Multiple Pain Sites  No                       [facility] Adult PT Treatment/Exercise - 04/12/19 0001      Knee/Hip Exercises: Aerobic   Nustep  6' at L2   PT present to discuss response to last visit     Knee/Hip Exercises: Standing   Functional Squat  15 reps;3 seconds    Functional Squat Limitations  cues to hinge at hips; red band for glute med activation and only able to do quarter squat with correct mechanics      Knee/Hip Exercises: Seated   Sit to Sand  15 reps;without UE support   cues to keep weight even bilat     Iontophoresis   Type of Iontophoresis  Dexamethasone    Location  Lt SI joint line    Dose  4 mg/ml     Time  4-6 hour patch       Manual Therapy   Soft tissue mobilization  left gluteals, piriformis, lateral quads, ITB and lateral HS       Trigger Point Dry Needling - 04/12/19 0001    Consent Given?  Yes    Gluteus Minimus Response  Twitch response elicited;Palpable increased muscle length    Gluteus Medius Response  Twitch response elicited;Palpable increased muscle length    Gluteus Maximus Response  Twitch response elicited;Palpable increased muscle length    Piriformis Response  Twitch response elicited;Palpable increased muscle length    Tensor Fascia Lata Response  Twitch response elicited;Palpable increased muscle length             PT Short Term Goals - 04/07/19 1326      PT SHORT TERM GOAL #1   Title  independent with initial HEP    Status  Achieved      PT SHORT TERM GOAL #2   Title  Pt will report 30% reduction of pain    Status  On-going        PT Long  Term Goals - 03/31/19 1950      PT LONG TERM GOAL #1   Title  Pt will be independent and safe with advanced HEP    Time  8    Period  Weeks    Status  New    Target Date  05/26/19      PT LONG TERM GOAL #2   Title  Pt will report 2/10 pain at most due to improved core strength and stability    Time  8    Period  Weeks    Status  New    Target Date  05/26/19      PT LONG TERM GOAL #3   Title  Pt will demonsrate hip abdcution and adduction strength of 5/5 MMT for improved pelvic stability with walking    Time  8    Period  Weeks    Status  New    Target Date  05/26/19      PT LONG TERM GOAL #4   Title  Pt will report < or = to 18% limited FOTO for improved function    Time  8    Period  Weeks    Status  New    Target Date  05/26/19            Plan - 04/12/19 1620    Clinical Impression Statement  Pt did well with exercises and review of squat and sit to stand.  She was able to do with correct posture and body mechanics to quarter squat position and added restistance.  Pt got good soft tissue length with DN and manual techniques as described.  She will benefit from skilled PT to continue with POC.    PT Treatment/Interventions  ADLs/Self Care Home Management;Cryotherapy;Electrical Stimulation;Iontophoresis 4mg /ml Dexamethasone;Moist Heat;Therapeutic activities;Therapeutic exercise;Traction;Neuromuscular re-education;Patient/family education;Manual techniques;Taping;Dry needling;Passive range of motion    PT Next Visit Plan  assess response to updates to HEP and ionto #3, continue glute and core strength, flexibility    PT Home Exercise Plan  Access Code: [email or web address]    Consulted and Agree with Plan of Care  Patient       Patient will benefit from skilled therapeutic intervention in order to improve the following deficits and impairments:  Abnormal gait, Increased muscle spasms, Decreased range  of motion, Decreased strength, Pain  Visit Diagnosis: No diagnosis  found.     Problem List Patient Active Problem List   Diagnosis Date Noted  . Status post total replacement of left hip 07/20/2018  . Unilateral primary osteoarthritis, left hip 05/25/2018  . Genetic testing 11/14/2016  . Family history of ovarian cancer   . Family history of colon cancer   . Family history of uterine cancer   . Family history of stomach cancer   . Osteoarthritis of left knee 04/03/2015  . S/P total knee replacement using cement 04/03/2015  . Gait abnormality 12/22/2012  . Swelling of ankle 12/22/2012  . Ringworm   . Hypertension   . Fibroid   . Headache(784.0)   . Arthritis   . Vitamin D deficiency     Jule Ser, PT 04/12/2019, 4:24 PM  [facility] [address], [place], [place], [address] Phone: [phone]   Fax:  [phone]  Name: Sharon [name] MRN: [id number] Date of Birth: Mar 25, 1951

## 2019-04-14 ENCOUNTER — Encounter: Payer: Self-pay | Admitting: Physical Therapy

## 2019-04-14 ENCOUNTER — Ambulatory Visit: Payer: PPO | Admitting: Physical Therapy

## 2019-04-14 ENCOUNTER — Other Ambulatory Visit: Payer: Self-pay

## 2019-04-14 DIAGNOSIS — M25552 Pain in left hip: Secondary | ICD-10-CM

## 2019-04-14 DIAGNOSIS — M6281 Muscle weakness (generalized): Secondary | ICD-10-CM

## 2019-04-14 DIAGNOSIS — M25652 Stiffness of left hip, not elsewhere classified: Secondary | ICD-10-CM

## 2019-04-14 NOTE — Therapy (Signed)
[facility] [address] [place], [place], [address] Phone: [phone]   Fax:  [phone]  Physical Therapy Treatment  Patient Details  Name: Sharon [name] MRN: [id number] Date of Birth: [birth year] Referring Provider (PT): Mcarthur Rossetti, MD   Encounter Date: 04/14/2019  PT End of Session - 04/14/19 1458    Visit Number  5    Date for PT Re-Evaluation  05/26/19    Authorization Type  [employer]    PT Start Time  1458    PT Stop Time  1540    PT Time Calculation (min)  42 min    Activity Tolerance  Patient tolerated treatment well    Behavior During Therapy  [facility] for tasks assessed/performed       Past Medical History:  Diagnosis Date  . Family history of adverse reaction to anesthesia    "mom did; don't know what happened" (07/20/2018)  . Family history of colon cancer   . Family history of ovarian cancer   . Family history of stomach cancer   . Family history of uterine cancer   . Fibroid   . Heart murmur    "tiny, tiny one" (07/20/2018)  . High cholesterol   . Hypertension   . Migraine    "a few/year; last one was 07/19/2018" (07/20/2018)  . Osteoarthritis    "knees, hips, hands, probably in my back" (07/20/2018)  . Ringworm   . Vitamin D deficiency     Past Surgical History:  Procedure Laterality Date  . APPENDECTOMY  1969  . BUNIONECTOMY Bilateral   . JOINT REPLACEMENT    . KNEE ARTHROSCOPY Left 11/2013  . Precancerous lesion excised     "arm"  . TONSILLECTOMY    . TOTAL ABDOMINAL HYSTERECTOMY  1990   TAH BSO  . TOTAL HIP ARTHROPLASTY Left 07/20/2018  . TOTAL HIP ARTHROPLASTY Left 07/20/2018   Procedure: LEFT TOTAL HIP ARTHROPLASTY ANTERIOR APPROACH;  Surgeon: Mcarthur Rossetti, MD;  Location: [place];  Service: Orthopedics;  Laterality: Left;  . TOTAL KNEE ARTHROPLASTY Right 2012  . TOTAL KNEE ARTHROPLASTY Left 04/03/2015   Procedure: TOTAL KNEE ARTHROPLASTY;   Surgeon: Garald Balding, MD;  Location: [place];  Service: Orthopedics;  Laterality: Left;    There were no vitals filed for this visit.  Subjective Assessment - 04/14/19 1503    Subjective  Pt states her glutes are sore.  No hip pain today    Patient Stated Goals  Get exercises that prevent the hip from hurting    Currently in Pain?  No/denies                       [facility] Adult PT Treatment/Exercise - 04/14/19 0001      Knee/Hip Exercises: Stretches   Hip Flexor Stretch  Right;Left;2 reps;20 seconds    Piriformis Stretch  Both;30 seconds;2 reps    Piriformis Stretch Limitations  seated figure 4      Knee/Hip Exercises: Aerobic   Nustep  6' at L1   PT present to discuss response to last visit     Knee/Hip Exercises: Machines for Strengthening   Cybex Leg Press  seat 7; bilat 80lb; single leg 40 lb      Knee/Hip Exercises: Standing   Hip Flexion  Stengthening;Right;Left;10 reps;Knee straight    Hip Flexion Limitations  red band    Functional Squat  3 seconds;20 reps    Functional Squat Limitations  cues  to hinge at hips and sit back - weight shift evenly    Other Standing Knee Exercises  dead lift form no weight; 10 reps holding 5lb hand weights               PT Short Term Goals - 04/07/19 1326      PT SHORT TERM GOAL #1   Title  independent with initial HEP    Status  Achieved      PT SHORT TERM GOAL #2   Title  Pt will report 30% reduction of pain    Status  On-going        PT Long Term Goals - 03/31/19 1950      PT LONG TERM GOAL #1   Title  Pt will be independent and safe with advanced HEP    Time  8    Period  Weeks    Status  New    Target Date  05/26/19      PT LONG TERM GOAL #2   Title  Pt will report 2/10 pain at most due to improved core strength and stability    Time  8    Period  Weeks    Status  New    Target Date  05/26/19      PT LONG TERM GOAL #3   Title  Pt will demonsrate hip abdcution and adduction strength of  5/5 MMT for improved pelvic stability with walking    Time  8    Period  Weeks    Status  New    Target Date  05/26/19      PT LONG TERM GOAL #4   Title  Pt will report < or = to 18% limited FOTO for improved function    Time  8    Period  Weeks    Status  New    Target Date  05/26/19            Plan - 04/14/19 1540    Clinical Impression Statement  Pt did well with exercises and felt fatigue without increased pain.  Pt felt the dead lift with weight more in her back but form looked good with cues to hinge at the hips.  Pt will benefit from skilled PT to continue working on functional goals.    PT Treatment/Interventions  ADLs/Self Care Home Management;Cryotherapy;Electrical Stimulation;Iontophoresis 4mg /ml Dexamethasone;Moist Heat;Therapeutic activities;Therapeutic exercise;Traction;Neuromuscular re-education;Patient/family education;Manual techniques;Taping;Dry needling;Passive range of motion    PT Next Visit Plan  DN if needed; ionto #4, continue glute and core strength, flexibility, dead lift and squat    PT Home Exercise Plan  Access Code: [email or web address]    Consulted and Agree with Plan of Care  Patient       Patient will benefit from skilled therapeutic intervention in order to improve the following deficits and impairments:  Abnormal gait, Increased muscle spasms, Decreased range of motion, Decreased strength, Pain  Visit Diagnosis: 1. Pain in left hip   2. Stiffness of left hip, not elsewhere classified   3. Muscle weakness (generalized)        Problem List Patient Active Problem List   Diagnosis Date Noted  . Status post total replacement of left hip 07/20/2018  . Unilateral primary osteoarthritis, left hip 05/25/2018  . Genetic testing 11/14/2016  . Family history of ovarian cancer   . Family history of colon cancer   . Family history of uterine cancer   . Family history of stomach cancer   . Osteoarthritis of  left knee 04/03/2015  . S/P total knee replacement  using cement 04/03/2015  . Gait abnormality 12/22/2012  . Swelling of ankle 12/22/2012  . Ringworm   . Hypertension   . Fibroid   . Headache(784.0)   . Arthritis   . Vitamin D deficiency     Jule Ser, PT 04/14/2019, 3:43 PM  [facility] [address] [place], [place], [address] Phone: [phone]   Fax:  [phone]  Name: Sharon [name] MRN: [id number] Date of Birth: 1951/10/01

## 2019-04-19 ENCOUNTER — Encounter: Payer: Self-pay | Admitting: Physical Therapy

## 2019-04-19 ENCOUNTER — Ambulatory Visit: Payer: PPO | Admitting: Physical Therapy

## 2019-04-19 ENCOUNTER — Other Ambulatory Visit: Payer: Self-pay

## 2019-04-19 DIAGNOSIS — M25552 Pain in left hip: Secondary | ICD-10-CM

## 2019-04-19 DIAGNOSIS — M6281 Muscle weakness (generalized): Secondary | ICD-10-CM

## 2019-04-19 DIAGNOSIS — M25652 Stiffness of left hip, not elsewhere classified: Secondary | ICD-10-CM

## 2019-04-19 NOTE — Therapy (Signed)
[facility] [address] [place], [place], [address] Phone: [phone]   Fax:  [phone]  Physical Therapy Treatment  Patient Details  Name: Sharon [name] MRN: [id number] Date of Birth: June 23, 1951 Referring Provider (PT): Mcarthur Rossetti, MD   Encounter Date: 04/19/2019  PT End of Session - 04/19/19 1614    Visit Number  6    Date for PT Re-Evaluation  05/26/19    Authorization Type  [employer]    PT Start Time  1530    PT Stop Time  1615    PT Time Calculation (min)  45 min    Activity Tolerance  Patient tolerated treatment well       Past Medical History:  Diagnosis Date  . Family history of adverse reaction to anesthesia    "mom did; don't know what happened" (07/20/2018)  . Family history of colon cancer   . Family history of ovarian cancer   . Family history of stomach cancer   . Family history of uterine cancer   . Fibroid   . Heart murmur    "tiny, tiny one" (07/20/2018)  . High cholesterol   . Hypertension   . Migraine    "a few/year; last one was 07/19/2018" (07/20/2018)  . Osteoarthritis    "knees, hips, hands, probably in my back" (07/20/2018)  . Ringworm   . Vitamin D deficiency     Past Surgical History:  Procedure Laterality Date  . APPENDECTOMY  1969  . BUNIONECTOMY Bilateral   . JOINT REPLACEMENT    . KNEE ARTHROSCOPY Left 11/2013  . Precancerous lesion excised     "arm"  . TONSILLECTOMY    . TOTAL ABDOMINAL HYSTERECTOMY  1990   TAH BSO  . TOTAL HIP ARTHROPLASTY Left 07/20/2018  . TOTAL HIP ARTHROPLASTY Left 07/20/2018   Procedure: LEFT TOTAL HIP ARTHROPLASTY ANTERIOR APPROACH;  Surgeon: Mcarthur Rossetti, MD;  Location: [place];  Service: Orthopedics;  Laterality: Left;  . TOTAL KNEE ARTHROPLASTY Right 2012  . TOTAL KNEE ARTHROPLASTY Left 04/03/2015   Procedure: TOTAL KNEE ARTHROPLASTY;  Surgeon: Garald Balding, MD;  Location: [place];  Service:  Orthopedics;  Laterality: Left;    There were no vitals filed for this visit.  Subjective Assessment - 04/19/19 1533    Subjective  I think everything is really good.  It is helping. I feel good after the exercises.    Pertinent History  Lt THA    Patient Stated Goals  Get exercises that prevent the hip from hurting    Currently in Pain?  No/denies    Pain Score  0-No pain    Pain Location  Hip    Pain Orientation  Left    Pain Type  Chronic pain                       [facility] Adult PT Treatment/Exercise - 04/19/19 0001      Knee/Hip Exercises: Aerobic   Nustep  6' at L1   PT present to discuss response to last visit     Knee/Hip Exercises: Machines for Strengthening   Cybex Leg Press  seat 7; bilat 80lb; single leg 40 lb 20x each       Knee/Hip Exercises: Standing   SLS with Vectors  SLS with red band pallof press and marching 5x each right/left     Other Standing Knee Exercises  wall glute squeezes with UE reaches 3 sets of  8       Iontophoresis   Type of Iontophoresis  Dexamethasone    Location  Lt SI joint line    Dose  4 mg/ml     Time  4-6 hour patch    #5     Manual Therapy   Soft tissue mobilization  left gluteals, piriformis, lateral quads, ITB and lateral HS       Trigger Point Dry Needling - 04/19/19 0001    Consent Given?  Yes    Gluteus Minimus Response  Twitch response elicited;Palpable increased muscle length    Gluteus Medius Response  Twitch response elicited;Palpable increased muscle length    Gluteus Maximus Response  Twitch response elicited;Palpable increased muscle length    Piriformis Response  Twitch response elicited;Palpable increased muscle length    Tensor Fascia Lata Response  Twitch response elicited;Palpable increased muscle length             PT Short Term Goals - 04/07/19 1326      PT SHORT TERM GOAL #1   Title  independent with initial HEP    Status  Achieved      PT SHORT TERM GOAL #2   Title  Pt will  report 30% reduction of pain    Status  On-going        PT Long Term Goals - 03/31/19 1950      PT LONG TERM GOAL #1   Title  Pt will be independent and safe with advanced HEP    Time  8    Period  Weeks    Status  New    Target Date  05/26/19      PT LONG TERM GOAL #2   Title  Pt will report 2/10 pain at most due to improved core strength and stability    Time  8    Period  Weeks    Status  New    Target Date  05/26/19      PT LONG TERM GOAL #3   Title  Pt will demonsrate hip abdcution and adduction strength of 5/5 MMT for improved pelvic stability with walking    Time  8    Period  Weeks    Status  New    Target Date  05/26/19      PT LONG TERM GOAL #4   Title  Pt will report < or = to 18% limited FOTO for improved function    Time  8    Period  Weeks    Status  New    Target Date  05/26/19            Plan - 04/19/19 1614    Clinical Impression Statement  The patient reports reduction in pain with functional activities.  Decreasing pelvic drop overall but gluteal weakness still present with single leg standing.  Verbal and tactile cues for patellofemoral alignment and avoidance of adduction and internal rotation noted with sit to stand, Nu-Step and leg press.  Decreasing tender point size and number of tender points in hip musculature.  Good response to iontophoresis.  Therapist closely monitoring response to all treatment interventions.    Rehab Potential  Excellent    PT Frequency  2x / week    PT Duration  8 weeks    PT Treatment/Interventions  ADLs/Self Care Home Management;Cryotherapy;Electrical Stimulation;Iontophoresis 4mg /ml Dexamethasone;Moist Heat;Therapeutic activities;Therapeutic exercise;Traction;Neuromuscular re-education;Patient/family education;Manual techniques;Taping;Dry needling;Passive range of motion    PT Next Visit Plan  continue glute and core strength,  flexibility, dead lift and squat; 1 more ionto    PT Home Exercise Plan  Access Code:  [email or web address]       Patient will benefit from skilled therapeutic intervention in order to improve the following deficits and impairments:     Visit Diagnosis: 1. Pain in left hip   2. Stiffness of left hip, not elsewhere classified   3. Muscle weakness (generalized)        Problem List Patient Active Problem List   Diagnosis Date Noted  . Status post total replacement of left hip 07/20/2018  . Unilateral primary osteoarthritis, left hip 05/25/2018  . Genetic testing 11/14/2016  . Family history of ovarian cancer   . Family history of colon cancer   . Family history of uterine cancer   . Family history of stomach cancer   . Osteoarthritis of left knee 04/03/2015  . S/P total knee replacement using cement 04/03/2015  . Gait abnormality 12/22/2012  . Swelling of ankle 12/22/2012  . Ringworm   . Hypertension   . Fibroid   . Headache(784.0)   . Arthritis   . Vitamin D deficiency    Ruben Im, PT 04/19/19 4:22 PM Phone: [phone] Fax: [phone] Alvera Singh 04/19/2019, 4:22 PM  [facility] [address] [place], [place], [address] Phone: [phone]   Fax:  [phone]  Name: Sharon [name] MRN: [id number] Date of Birth: [birth year]

## 2019-04-21 ENCOUNTER — Encounter: Payer: Self-pay | Admitting: Physical Therapy

## 2019-04-21 ENCOUNTER — Other Ambulatory Visit: Payer: Self-pay

## 2019-04-21 ENCOUNTER — Ambulatory Visit: Payer: PPO | Attending: Orthopaedic Surgery | Admitting: Physical Therapy

## 2019-04-21 DIAGNOSIS — M25552 Pain in left hip: Secondary | ICD-10-CM | POA: Insufficient documentation

## 2019-04-21 DIAGNOSIS — M6281 Muscle weakness (generalized): Secondary | ICD-10-CM | POA: Insufficient documentation

## 2019-04-21 DIAGNOSIS — M25652 Stiffness of left hip, not elsewhere classified: Secondary | ICD-10-CM | POA: Insufficient documentation

## 2019-04-21 NOTE — Therapy (Signed)
[facility] [address], [place], [place], [address] Phone: [phone]   Fax:  [phone]  Physical Therapy Treatment  Patient Details  Name: Sharon [name] MRN: [id number] Date of Birth: 08/14/1951 Referring Provider (PT): Mcarthur Rossetti, MD   Encounter Date: 04/21/2019  PT End of Session - 04/21/19 1620    Visit Number  7    Date for PT Re-Evaluation  05/26/19    Authorization Type  [employer]    PT Start Time  1530    PT Stop Time  1615    PT Time Calculation (min)  45 min    Activity Tolerance  Patient tolerated treatment well       Past Medical History:  Diagnosis Date  . Family history of adverse reaction to anesthesia    "mom did; don't know what happened" (07/20/2018)  . Family history of colon cancer   . Family history of ovarian cancer   . Family history of stomach cancer   . Family history of uterine cancer   . Fibroid   . Heart murmur    "tiny, tiny one" (07/20/2018)  . High cholesterol   . Hypertension   . Migraine    "a few/year; last one was 07/19/2018" (07/20/2018)  . Osteoarthritis    "knees, hips, hands, probably in my back" (07/20/2018)  . Ringworm   . Vitamin D deficiency     Past Surgical History:  Procedure Laterality Date  . APPENDECTOMY  1969  . BUNIONECTOMY Bilateral   . JOINT REPLACEMENT    . KNEE ARTHROSCOPY Left 11/2013  . Precancerous lesion excised     "arm"  . TONSILLECTOMY    . TOTAL ABDOMINAL HYSTERECTOMY  1990   TAH BSO  . TOTAL HIP ARTHROPLASTY Left 07/20/2018  . TOTAL HIP ARTHROPLASTY Left 07/20/2018   Procedure: LEFT TOTAL HIP ARTHROPLASTY ANTERIOR APPROACH;  Surgeon: Mcarthur Rossetti, MD;  Location: [place];  Service: Orthopedics;  Laterality: Left;  . TOTAL KNEE ARTHROPLASTY Right 2012  . TOTAL KNEE ARTHROPLASTY Left 04/03/2015   Procedure: TOTAL KNEE ARTHROPLASTY;  Surgeon: Garald Balding, MD;  Location: [place];  Service:  Orthopedics;  Laterality: Left;    There were no vitals filed for this visit.  Subjective Assessment - 04/21/19 1529    Subjective  I overstretched with my strap and pulled something in my groin area (not the usual spot). I don't have any of my usual pain in the hip.   I walked today 25 minutes.    Pertinent History  Lt THA    Patient Stated Goals  Get exercises that prevent the hip from hurting    Currently in Pain?  No/denies    Pain Score  0-No pain    Pain Location  Hip    Pain Orientation  Left    Pain Type  Chronic pain                       [facility] Adult PT Treatment/Exercise - 04/21/19 0001      Knee/Hip Exercises: Stretches   Other Knee/Hip Stretches  2nd step hip flexor stretch with UE raise for added stretch 10x right/left       Knee/Hip Exercises: Aerobic   Nustep  3' at L2; 3' at L3   PT present to discuss response to last visit     Knee/Hip Exercises: Machines for Strengthening   Cybex Leg Press  seat 7; bilat 80lb; single  leg 40 lb 20x each       Knee/Hip Exercises: Standing   Abduction Limitations  hip abduction isometric pushing against small blue ball 10x right/left     SLS with Vectors  SLS with red band pallof press and marching 5x each right/left and retro lunge 5x mirror feedback     Other Standing Knee Exercises  staggered standing with blue band pulses 15x right/left     Other Standing Knee Exercises  retro stepping with blue band resistance 30x       Knee/Hip Exercises: Seated   Sit to Sand  5 reps;without UE support   holding 10# cues for patellofem alignment     Iontophoresis   Type of Iontophoresis  Dexamethasone    Location  Lt anterior/lateral hip    Dose  4 mg/ml     Time  4-6 hour patch    #6              PT Short Term Goals - 04/07/19 1326      PT SHORT TERM GOAL #1   Title  independent with initial HEP    Status  Achieved      PT SHORT TERM GOAL #2   Title  Pt will report 30% reduction of pain    Status   On-going        PT Long Term Goals - 03/31/19 1950      PT LONG TERM GOAL #1   Title  Pt will be independent and safe with advanced HEP    Time  8    Period  Weeks    Status  New    Target Date  05/26/19      PT LONG TERM GOAL #2   Title  Pt will report 2/10 pain at most due to improved core strength and stability    Time  8    Period  Weeks    Status  New    Target Date  05/26/19      PT LONG TERM GOAL #3   Title  Pt will demonsrate hip abdcution and adduction strength of 5/5 MMT for improved pelvic stability with walking    Time  8    Period  Weeks    Status  New    Target Date  05/26/19      PT LONG TERM GOAL #4   Title  Pt will report < or = to 18% limited FOTO for improved function    Time  8    Period  Weeks    Status  New    Target Date  05/26/19            Plan - 04/21/19 1621    Clinical Impression Statement  The patient reports decreasing hip pain overall.  Her anterior/lateral hip pain today is new and she attributes it to overstretching with her strap last night.  She is able to progress intensity of strengthening ex's today without exacerbation of symptoms.  She needs moderate cues for patellofemoral alignment with sit to stand and leg press to avoid excessive internal rotation.  Mirror feedback was helpful for this as well as maintaining level pelvis and shoulders with standing ex's.  Good response with iontophoresis series completed today.    Comorbidities  left THA, arthritis in SI joint    Rehab Potential  Excellent    PT Frequency  2x / week    PT Treatment/Interventions  ADLs/Self Care Home Management;Cryotherapy;Electrical Stimulation;Iontophoresis 4mg /ml Dexamethasone;Moist Heat;Therapeutic activities;Therapeutic  exercise;Traction;Neuromuscular re-education;Patient/family education;Manual techniques;Taping;Dry needling;Passive range of motion    PT Next Visit Plan  dry needling or manual therapy as needed;  glute and core strength, flexibility,  dead lift and squat; ionto series completed today; check remaining STGS    PT Home Exercise Plan  Access Code: [email or web address]       Patient will benefit from skilled therapeutic intervention in order to improve the following deficits and impairments:  Abnormal gait, Increased muscle spasms, Decreased range of motion, Decreased strength, Pain  Visit Diagnosis: 1. Pain in left hip   2. Stiffness of left hip, not elsewhere classified   3. Muscle weakness (generalized)        Problem List Patient Active Problem List   Diagnosis Date Noted  . Status post total replacement of left hip 07/20/2018  . Unilateral primary osteoarthritis, left hip 05/25/2018  . Genetic testing 11/14/2016  . Family history of ovarian cancer   . Family history of colon cancer   . Family history of uterine cancer   . Family history of stomach cancer   . Osteoarthritis of left knee 04/03/2015  . S/P total knee replacement using cement 04/03/2015  . Gait abnormality 12/22/2012  . Swelling of ankle 12/22/2012  . Ringworm   . Hypertension   . Fibroid   . Headache(784.0)   . Arthritis   . Vitamin D deficiency    Ruben Im, PT 04/21/19 4:30 PM Phone: [phone] Fax: [phone] Alvera Singh 04/21/2019, 4:28 PM  [facility] [address] [place], [place], [address] Phone: [phone]   Fax:  [phone]  Name: Sharon [name] MRN: [id number] Date of Birth: [birth year]

## 2019-04-25 ENCOUNTER — Ambulatory Visit: Payer: PPO | Admitting: Physical Therapy

## 2019-04-26 ENCOUNTER — Encounter: Payer: PPO | Admitting: Physical Therapy

## 2019-04-28 ENCOUNTER — Ambulatory Visit: Payer: PPO | Admitting: Physical Therapy

## 2019-04-28 ENCOUNTER — Encounter: Payer: Self-pay | Admitting: Physical Therapy

## 2019-04-28 ENCOUNTER — Other Ambulatory Visit: Payer: Self-pay

## 2019-04-28 DIAGNOSIS — M6281 Muscle weakness (generalized): Secondary | ICD-10-CM

## 2019-04-28 DIAGNOSIS — M25552 Pain in left hip: Secondary | ICD-10-CM

## 2019-04-28 DIAGNOSIS — M25652 Stiffness of left hip, not elsewhere classified: Secondary | ICD-10-CM

## 2019-04-28 NOTE — Therapy (Signed)
[facility] [address], [place], [place], [address] Phone: [phone]   Fax:  [phone]  Physical Therapy Treatment  Patient Details  Name: Sharon [name] MRN: [id number] Date of Birth: 20-May-1951 Referring Provider (PT): Mcarthur Rossetti, MD   Encounter Date: 04/28/2019  PT End of Session - 04/28/19 1616    Visit Number  8    Date for PT Re-Evaluation  05/26/19    Authorization Type  [employer]    PT Start Time  1532    PT Stop Time  1620    PT Time Calculation (min)  48 min    Activity Tolerance  Patient tolerated treatment well       Past Medical History:  Diagnosis Date  . Family history of adverse reaction to anesthesia    "mom did; don't know what happened" (07/20/2018)  . Family history of colon cancer   . Family history of ovarian cancer   . Family history of stomach cancer   . Family history of uterine cancer   . Fibroid   . Heart murmur    "tiny, tiny one" (07/20/2018)  . High cholesterol   . Hypertension   . Migraine    "a few/year; last one was 07/19/2018" (07/20/2018)  . Osteoarthritis    "knees, hips, hands, probably in my back" (07/20/2018)  . Ringworm   . Vitamin D deficiency     Past Surgical History:  Procedure Laterality Date  . APPENDECTOMY  1969  . BUNIONECTOMY Bilateral   . JOINT REPLACEMENT    . KNEE ARTHROSCOPY Left 11/2013  . Precancerous lesion excised     "arm"  . TONSILLECTOMY    . TOTAL ABDOMINAL HYSTERECTOMY  1990   TAH BSO  . TOTAL HIP ARTHROPLASTY Left 07/20/2018  . TOTAL HIP ARTHROPLASTY Left 07/20/2018   Procedure: LEFT TOTAL HIP ARTHROPLASTY ANTERIOR APPROACH;  Surgeon: Mcarthur Rossetti, MD;  Location: [place];  Service: Orthopedics;  Laterality: Left;  . TOTAL KNEE ARTHROPLASTY Right 2012  . TOTAL KNEE ARTHROPLASTY Left 04/03/2015   Procedure: TOTAL KNEE ARTHROPLASTY;  Surgeon: Garald Balding, MD;  Location: [place];  Service:  Orthopedics;  Laterality: Left;    There were no vitals filed for this visit.  Subjective Assessment - 04/28/19 1528    Subjective  Groin area pain is better.  A little sore today in the usual spot in my hip.  Other than that it has been pretty good.    Pertinent History  Lt THA    Patient Stated Goals  Get exercises that prevent the hip from hurting    Currently in Pain?  Yes    Pain Score  0-No pain    Pain Location  Hip    Pain Orientation  Left    Pain Type  Chronic pain                       [facility] Adult PT Treatment/Exercise - 04/28/19 0001      Knee/Hip Exercises: Stretches   Other Knee/Hip Stretches  2nd step hip flexor stretch with UE raise for added stretch 10x right/left       Knee/Hip Exercises: Aerobic   Nustep  L3 6 min       Knee/Hip Exercises: Machines for Strengthening   Cybex Leg Press  seat 7; bilat 90lb; single leg 45 lb 15x each       Knee/Hip Exercises: Standing   Hip Abduction  Stengthening;Right;Left;10 reps    Abduction Limitations  green band around thighs back and forth stepping     Hip Extension  Stengthening;Right;Left;2 sets;5 sets    Extension Limitations  diagonal steps to the back green band around thighs     Other Standing Knee Exercises  staggered standing with blue band pulses 15x right/left     Other Standing Knee Exercises  retro stepping with blue band resistance 30x       Moist Heat Therapy   Number Minutes Moist Heat  5 Minutes    Moist Heat Location  Lumbar Spine;Hip      Manual Therapy   Soft tissue mobilization  left lumbar paraspinals, gluteals       Trigger Point Dry Needling - 04/28/19 0001    Consent Given?  Yes    Muscles Treated Back/Hip  Lumbar multifidi    Dry Needling Comments  left side in prone position    Gluteus Minimus Response  Twitch response elicited;Palpable increased muscle length    Gluteus Medius Response  Twitch response elicited;Palpable increased muscle length    Gluteus Maximus  Response  Twitch response elicited;Palpable increased muscle length    Lumbar multifidi Response  Palpable increased muscle length             PT Short Term Goals - 04/28/19 1556      PT SHORT TERM GOAL #1   Title  independent with initial HEP    Status  Achieved      PT SHORT TERM GOAL #2   Title  Pt will report 30% reduction of pain    Baseline  75%    Status  Achieved      PT SHORT TERM GOAL #3   Title  The patient will have a 30% improvement in pain with dressing and night time discomfort    Status  Achieved        PT Long Term Goals - 03/31/19 1950      PT LONG TERM GOAL #1   Title  Pt will be independent and safe with advanced HEP    Time  8    Period  Weeks    Status  New    Target Date  05/26/19      PT LONG TERM GOAL #2   Title  Pt will report 2/10 pain at most due to improved core strength and stability    Time  8    Period  Weeks    Status  New    Target Date  05/26/19      PT LONG TERM GOAL #3   Title  Pt will demonsrate hip abdcution and adduction strength of 5/5 MMT for improved pelvic stability with walking    Time  8    Period  Weeks    Status  New    Target Date  05/26/19      PT LONG TERM GOAL #4   Title  Pt will report < or = to 18% limited FOTO for improved function    Time  8    Period  Weeks    Status  New    Target Date  05/26/19            Plan - 04/28/19 1616    Clinical Impression Statement  The patient reports a 75% overall improvement since the start of care.  Her pain level has improved with  daily activities.  Fewer verbal cues needed for patellofemoral alignment and to avoid pelvic  drop with single leg and staggered standing.  Fewer overall tender points noted in posterior hip musulature.  Inititated dry needling to left lumbar multifidi and paraspinals with improved soft tissue mobility noted following.  On track to meet rehab goals in next few visits.    Stability/Clinical Decision Making  Stable/Uncomplicated     Rehab Potential  Excellent    PT Frequency  2x / week    PT Duration  8 weeks    PT Treatment/Interventions  ADLs/Self Care Home Management;Cryotherapy;Electrical Stimulation;Iontophoresis 4mg /ml Dexamethasone;Moist Heat;Therapeutic activities;Therapeutic exercise;Traction;Neuromuscular re-education;Patient/family education;Manual techniques;Taping;Dry needling;Passive range of motion    PT Next Visit Plan  dry needling or manual therapy as needed;  glute and core strength, flexibility, dead lift and squat    PT Home Exercise Plan  Access Code: [email or web address]       Patient will benefit from skilled therapeutic intervention in order to improve the following deficits and impairments:  Abnormal gait, Increased muscle spasms, Decreased range of motion, Decreased strength, Pain  Visit Diagnosis: 1. Pain in left hip   2. Stiffness of left hip, not elsewhere classified   3. Muscle weakness (generalized)        Problem List Patient Active Problem List   Diagnosis Date Noted  . Status post total replacement of left hip 07/20/2018  . Unilateral primary osteoarthritis, left hip 05/25/2018  . Genetic testing 11/14/2016  . Family history of ovarian cancer   . Family history of colon cancer   . Family history of uterine cancer   . Family history of stomach cancer   . Osteoarthritis of left knee 04/03/2015  . S/P total knee replacement using cement 04/03/2015  . Gait abnormality 12/22/2012  . Swelling of ankle 12/22/2012  . Ringworm   . Hypertension   . Fibroid   . Headache(784.0)   . Arthritis   . Vitamin D deficiency    Ruben Im, PT 04/28/19 4:29 PM Phone: [phone] Fax: [phone] Alvera Singh 04/28/2019, 4:28 PM  [facility] [address], [place], [place], [address] Phone: [phone]   Fax:  [phone]  Name: Sharon [name] MRN: [id number] Date of Birth: Apr 05, 1951

## 2019-05-02 ENCOUNTER — Encounter: Payer: Self-pay | Admitting: Physical Therapy

## 2019-05-02 ENCOUNTER — Other Ambulatory Visit: Payer: Self-pay

## 2019-05-02 ENCOUNTER — Ambulatory Visit: Payer: PPO | Admitting: Physical Therapy

## 2019-05-02 DIAGNOSIS — M25652 Stiffness of left hip, not elsewhere classified: Secondary | ICD-10-CM

## 2019-05-02 DIAGNOSIS — M25552 Pain in left hip: Secondary | ICD-10-CM

## 2019-05-02 DIAGNOSIS — M6281 Muscle weakness (generalized): Secondary | ICD-10-CM

## 2019-05-02 NOTE — Patient Instructions (Signed)
Access Code: [email or web address]  URL: [email or web address]  Date: 05/02/2019  Prepared by: Jari Favre   Exercises  Supine Figure 4 Piriformis Stretch - 3 reps - 1 sets - 30 sec hold - 1x daily - 7x weekly  Hooklying Small March - 10 reps - 2 sets - 1x daily - 7x weekly  Ball squeeze with Kegel - 10 reps - 1 sets - 3 sec hold - 3x daily - 7x weekly  Clamshell - 10 reps - 1 sets - 1x daily - 7x weekly  Clamshell with Resistance - 10 reps - 1 sets - 1x daily - 7x weekly  Sit to Stand - 10 reps - 1 sets - 1x daily - 7x weekly  Isometric Gluteus Medius at Wall - 10 reps - 1 sets - 1x daily - 7x weekly  Hip Adductors and Hamstring Stretch with Strap - 3 reps - 2 sets - 30 hold - 1x daily - 7x weekly  Prone Quadriceps Stretch with Strap - 3 reps - 1 sets - 30sec hold - 1x daily - 7x weekly

## 2019-05-02 NOTE — Therapy (Signed)
[facility] [address], [place], [place], [address] Phone: [phone]   Fax:  [phone]  Physical Therapy Treatment  Patient Details  Name: Sharon [name] MRN: [id number] Date of Birth: 01/07/1951 Referring Provider (PT): Mcarthur Rossetti, MD   Encounter Date: 05/02/2019  PT End of Session - 05/02/19 1628    Visit Number  9    Date for PT Re-Evaluation  05/26/19    Authorization Type  [employer]    PT Start Time  1532    PT Stop Time  1624    PT Time Calculation (min)  52 min    Activity Tolerance  Patient tolerated treatment well    Behavior During Therapy  [facility] for tasks assessed/performed       Past Medical History:  Diagnosis Date  . Family history of adverse reaction to anesthesia    "mom did; don't know what happened" (07/20/2018)  . Family history of colon cancer   . Family history of ovarian cancer   . Family history of stomach cancer   . Family history of uterine cancer   . Fibroid   . Heart murmur    "tiny, tiny one" (07/20/2018)  . High cholesterol   . Hypertension   . Migraine    "a few/year; last one was 07/19/2018" (07/20/2018)  . Osteoarthritis    "knees, hips, hands, probably in my back" (07/20/2018)  . Ringworm   . Vitamin D deficiency     Past Surgical History:  Procedure Laterality Date  . APPENDECTOMY  1969  . BUNIONECTOMY Bilateral   . JOINT REPLACEMENT    . KNEE ARTHROSCOPY Left 11/2013  . Precancerous lesion excised     "arm"  . TONSILLECTOMY    . TOTAL ABDOMINAL HYSTERECTOMY  1990   TAH BSO  . TOTAL HIP ARTHROPLASTY Left 07/20/2018  . TOTAL HIP ARTHROPLASTY Left 07/20/2018   Procedure: LEFT TOTAL HIP ARTHROPLASTY ANTERIOR APPROACH;  Surgeon: Mcarthur Rossetti, MD;  Location: [place];  Service: Orthopedics;  Laterality: Left;  . TOTAL KNEE ARTHROPLASTY Right 2012  . TOTAL KNEE ARTHROPLASTY Left 04/03/2015   Procedure: TOTAL KNEE ARTHROPLASTY;   Surgeon: Garald Balding, MD;  Location: [place];  Service: Orthopedics;  Laterality: Left;    There were no vitals filed for this visit.  Subjective Assessment - 05/02/19 1537    Subjective  Groin is just a little sore at the top.  Glutes and hip feels good.  My knee has felt sore today I don't know.    Currently in Pain?  Yes    Pain Score  2     Pain Location  Groin    Pain Orientation  Left    Pain Descriptors / Indicators  Sore    Pain Radiating Towards  the knee is hurting a little 4/10 - not sure why just woke up like that    Multiple Pain Sites  No                       [facility] Adult PT Treatment/Exercise - 05/02/19 0001      Knee/Hip Exercises: Stretches   Active Hamstring Stretch  Right;Left;2 reps;30 seconds    Quad Stretch  Right;Left;2 reps;30 seconds    Gastroc Stretch  Both;3 reps;20 seconds      Knee/Hip Exercises: Aerobic   Nustep  L3 6 min       Knee/Hip Exercises: Standing   Hip ADduction  Strengthening;Right;Left;10  reps    Hip ADduction Limitations  green band    Hip Abduction  Stengthening;Right;Left;10 reps    Abduction Limitations  green band around thighs back and forth stepping     Hip Extension  Stengthening;Right;Left;2 sets;5 sets    Extension Limitations  diagonal steps to the back green band around thighs     SLS with Vectors  SLS pallof pushes - green band    Other Standing Knee Exercises  staggered standing with green band pushes 10x right/left       Manual Therapy   Soft tissue mobilization  left quads             PT Education - 05/02/19 1553    Education Details  Access Code: [email or web address]    Person(s) Educated  Patient    Methods  Explanation;Demonstration;Handout;Verbal cues       PT Short Term Goals - 04/28/19 1556      PT SHORT TERM GOAL #1   Title  independent with initial HEP    Status  Achieved      PT SHORT TERM GOAL #2   Title  Pt will report 30% reduction of pain    Baseline  75%    Status  Achieved       PT SHORT TERM GOAL #3   Title  The patient will have a 30% improvement in pain with dressing and night time discomfort    Status  Achieved        PT Long Term Goals - 03/31/19 1950      PT LONG TERM GOAL #1   Title  Pt will be independent and safe with advanced HEP    Time  8    Period  Weeks    Status  New    Target Date  05/26/19      PT LONG TERM GOAL #2   Title  Pt will report 2/10 pain at most due to improved core strength and stability    Time  8    Period  Weeks    Status  New    Target Date  05/26/19      PT LONG TERM GOAL #3   Title  Pt will demonsrate hip abdcution and adduction strength of 5/5 MMT for improved pelvic stability with walking    Time  8    Period  Weeks    Status  New    Target Date  05/26/19      PT LONG TERM GOAL #4   Title  Pt will report < or = to 18% limited FOTO for improved function    Time  8    Period  Weeks    Status  New    Target Date  05/26/19            Plan - 05/02/19 1629    Clinical Impression Statement  Pt is feeling much better.  She has a lot of tension in the quads Lt>Rt and felt decreased pain after STM and quad stretch techniques.  Stretch added to HEP so she could maintain the increased muscle length achieved during treatment.  Pt will benefit from skilled PT to ensure successful transition to HEP.    PT Treatment/Interventions  ADLs/Self Care Home Management;Cryotherapy;Electrical Stimulation;Iontophoresis 4mg /ml Dexamethasone;Moist Heat;Therapeutic activities;Therapeutic exercise;Traction;Neuromuscular re-education;Patient/family education;Manual techniques;Taping;Dry needling;Passive range of motion    PT Next Visit Plan  PN next, review HEP as needed, dry needling or manual therapy as needed adding quads;  glute and core strength,  flexibility, dead lift and squat    PT Home Exercise Plan  Access Code: [email or web address]    Consulted and Agree with Plan of Care  Patient       Patient will benefit from skilled  therapeutic intervention in order to improve the following deficits and impairments:  Abnormal gait, Increased muscle spasms, Decreased range of motion, Decreased strength, Pain  Visit Diagnosis: 1. Pain in left hip   2. Stiffness of left hip, not elsewhere classified   3. Muscle weakness (generalized)        Problem List Patient Active Problem List   Diagnosis Date Noted  . Status post total replacement of left hip 07/20/2018  . Unilateral primary osteoarthritis, left hip 05/25/2018  . Genetic testing 11/14/2016  . Family history of ovarian cancer   . Family history of colon cancer   . Family history of uterine cancer   . Family history of stomach cancer   . Osteoarthritis of left knee 04/03/2015  . S/P total knee replacement using cement 04/03/2015  . Gait abnormality 12/22/2012  . Swelling of ankle 12/22/2012  . Ringworm   . Hypertension   . Fibroid   . Headache(784.0)   . Arthritis   . Vitamin D deficiency     Jule Ser, PT 05/02/2019, 4:32 PM  [facility] [address], [place], [place], [address] Phone: [phone]   Fax:  [phone]  Name: Sharon [name] MRN: [id number] Date of Birth: 02-25-1951

## 2019-05-03 ENCOUNTER — Encounter: Payer: PPO | Admitting: Physical Therapy

## 2019-05-05 ENCOUNTER — Ambulatory Visit: Payer: PPO | Admitting: Physical Therapy

## 2019-05-10 DIAGNOSIS — R070 Pain in throat: Secondary | ICD-10-CM | POA: Diagnosis not present

## 2019-05-10 DIAGNOSIS — Z1159 Encounter for screening for other viral diseases: Secondary | ICD-10-CM | POA: Diagnosis not present

## 2019-05-19 ENCOUNTER — Encounter: Payer: Self-pay | Admitting: Physical Therapy

## 2019-05-19 ENCOUNTER — Ambulatory Visit: Payer: PPO | Admitting: Physical Therapy

## 2019-05-19 ENCOUNTER — Other Ambulatory Visit: Payer: Self-pay

## 2019-05-19 DIAGNOSIS — M6281 Muscle weakness (generalized): Secondary | ICD-10-CM

## 2019-05-19 DIAGNOSIS — M25552 Pain in left hip: Secondary | ICD-10-CM | POA: Diagnosis not present

## 2019-05-19 DIAGNOSIS — M25652 Stiffness of left hip, not elsewhere classified: Secondary | ICD-10-CM

## 2019-05-19 NOTE — Patient Instructions (Signed)
Access Code: [email or web address]  URL: [email or web address]  Date: 05/19/2019  Prepared by: Venetia Night Beuhring   Exercises  Supine Figure 4 Piriformis Stretch - 3 reps - 1 sets - 30 sec hold - 1x daily - 7x weekly  Hooklying Small March - 10 reps - 2 sets - 1x daily - 7x weekly  Ball squeeze with Kegel - 10 reps - 1 sets - 3 sec hold - 3x daily - 7x weekly  Clamshell - 10 reps - 1 sets - 1x daily - 7x weekly  Clamshell with Resistance - 10 reps - 1 sets - 1x daily - 7x weekly  Sit to Stand - 10 reps - 1 sets - 1x daily - 7x weekly  Isometric Gluteus Medius at Wall - 10 reps - 1 sets - 1x daily - 7x weekly  Hip Adductors and Hamstring Stretch with Strap - 3 reps - 2 sets - 30 hold - 1x daily - 7x weekly  Prone Quadriceps Stretch with Strap - 3 reps - 1 sets - 30sec hold - 1x daily - 7x weekly  Hip Flexor Stretch on Step - 2 reps - 1 sets - 30 hold - 1x daily - 7x weekly  Seated Hip Abduction with Resistance - 10 reps - 3 sets - 1x daily - 7x weekly

## 2019-05-19 NOTE — Therapy (Signed)
[facility] [address], [place], [place], [address] Phone: [phone]   Fax:  [phone]  Physical Therapy Treatment  Patient Details  Name: Sharon [name] MRN: [id number] Date of Birth: 01-Aug-1951 Referring Provider (PT): Mcarthur Rossetti, MD  Progress Note Reporting Period 03/31/19 to 05/19/19  See note below for Objective Data and Assessment of Progress/Goals.      Encounter Date: 05/19/2019  PT End of Session - 05/19/19 1625    Visit Number  10    Date for PT Re-Evaluation  05/26/19    Authorization Type  [employer]    PT Start Time  1530    PT Stop Time  1615    PT Time Calculation (min)  45 min    Activity Tolerance  Patient tolerated treatment well    Behavior During Therapy  [facility] for tasks assessed/performed       Past Medical History:  Diagnosis Date  . Family history of adverse reaction to anesthesia    "mom did; don't know what happened" (07/20/2018)  . Family history of colon cancer   . Family history of ovarian cancer   . Family history of stomach cancer   . Family history of uterine cancer   . Fibroid   . Heart murmur    "tiny, tiny one" (07/20/2018)  . High cholesterol   . Hypertension   . Migraine    "a few/year; last one was 07/19/2018" (07/20/2018)  . Osteoarthritis    "knees, hips, hands, probably in my back" (07/20/2018)  . Ringworm   . Vitamin D deficiency     Past Surgical History:  Procedure Laterality Date  . APPENDECTOMY  1969  . BUNIONECTOMY Bilateral   . JOINT REPLACEMENT    . KNEE ARTHROSCOPY Left 11/2013  . Precancerous lesion excised     "arm"  . TONSILLECTOMY    . TOTAL ABDOMINAL HYSTERECTOMY  1990   TAH BSO  . TOTAL HIP ARTHROPLASTY Left 07/20/2018  . TOTAL HIP ARTHROPLASTY Left 07/20/2018   Procedure: LEFT TOTAL HIP ARTHROPLASTY ANTERIOR APPROACH;  Surgeon: Mcarthur Rossetti, MD;  Location: [place];  Service: Orthopedics;  Laterality:  Left;  . TOTAL KNEE ARTHROPLASTY Right 2012  . TOTAL KNEE ARTHROPLASTY Left 04/03/2015   Procedure: TOTAL KNEE ARTHROPLASTY;  Surgeon: Garald Balding, MD;  Location: [place];  Service: Orthopedics;  Laterality: Left;    There were no vitals filed for this visit.  Subjective Assessment - 05/19/19 1535    Subjective  Pt states she is about 80% better since starting PT.  Every now and then I get myself into a little trouble with either overdoing the exercises or get little hitches in my Lt hip with increased activity days.  I do the HEP about 3x/week.    Pertinent History  Lt THA    Limitations  Walking    How long can you walk comfortably?  30+ min/day for exercise    Diagnostic tests  x-ray for SI joint    Patient Stated Goals  Get exercises that prevent the hip from hurting    Currently in Pain?  Yes    Pain Score  2     Pain Location  Groin    Pain Orientation  Left    Pain Descriptors / Indicators  Sore    Pain Type  Chronic pain    Pain Onset  More than a month ago    Pain Frequency  Intermittent  Aggravating Factors   unsure    Pain Relieving Factors  advil    Effect of Pain on Daily Activities  pain with increased activity         [facility] PT Assessment - 05/19/19 0001      Assessment   Medical Diagnosis  M25.552 (ICD-10-CM) - Pain in left hip    Referring Provider (PT)  Mcarthur Rossetti, MD    Onset Date/Surgical Date  --   2 months ago   Prior Therapy  No      Observation/Other Assessments   Focus on Therapeutic Outcomes (FOTO)   29%   although Pt reports 80% improvement     AROM   Overall AROM Comments  Lt hip WFL all planes, lumbar WFL      PROM   Overall PROM Comments  WFL, 75 deg      Strength   Overall Strength Comments  improving core recruitment in supine, sidelying, standing    Right/Left Hip  Left    Left Hip Flexion  5/5    Left Hip Extension  5/5    Left Hip External Rotation  4+/5    Left Hip Internal Rotation  5/5    Left Hip ABduction   4+/5    Left Hip ADduction  5/5      Flexibility   Soft Tissue Assessment /Muscle Length  --   WNL with exception of Lt quad by 15%     Ambulation/Gait   Gait Pattern  Trendelenburg   improving Trendelenburg but Pt needs cueing to use glute med                  [facility] Adult PT Treatment/Exercise - 05/19/19 0001      Knee/Hip Exercises: Stretches   Active Hamstring Stretch  Left;Right;30 seconds    Active Hamstring Stretch Limitations  with strap, supine    Hip Flexor Stretch  Both;30 seconds    Hip Flexor Stretch Limitations  on second step, lunge style      Knee/Hip Exercises: Aerobic   Nustep  L3 6 min PT present to review goals      Knee/Hip Exercises: Standing   Hip ADduction  Strengthening;Right;Left;10 reps    Hip ADduction Limitations  green band    Hip Abduction  Stengthening;Right;Left;10 reps    Abduction Limitations  green band around thighs back and forth stepping     Hip Extension  Stengthening;Right;Left;2 sets;5 sets    Extension Limitations  diagonal steps to the back green band around thighs       Knee/Hip Exercises: Seated   Abduction/Adduction   Strengthening;Both;10 reps;2 sets    Abd/Adduction Limitations  green band clam with 5 pulses at outer range each rep    Sit to Sand  10 reps;without UE support   green band around knees, PT cued control of valgus     Knee/Hip Exercises: Sidelying   Clams  green band x 10 reps each, Pt with good demo of postural set up to stack pelvis with mild cueing             PT Education - 05/19/19 1614    Education Details  Access Code: [email or web address]    Person(s) Educated  Patient    Methods  Explanation;Demonstration;Verbal cues;Handout    Comprehension  Verbalized understanding;Returned demonstration       PT Short Term Goals - 04/28/19 1556      PT SHORT TERM GOAL #1   Title  independent with initial  HEP    Status  Achieved      PT SHORT TERM GOAL #2   Title  Pt will report 30% reduction of pain     Baseline  75%    Status  Achieved      PT SHORT TERM GOAL #3   Title  The patient will have a 30% improvement in pain with dressing and night time discomfort    Status  Achieved        PT Long Term Goals - 05/19/19 1545      PT LONG TERM GOAL #1   Title  Pt will be independent and safe with advanced HEP    Status  On-going      PT LONG TERM GOAL #2   Title  Pt will report 2/10 pain at most due to improved core strength and stability    Status  Achieved      PT LONG TERM GOAL #3   Title  Pt will demonsrate hip abdcution and adduction strength of 5/5 MMT for improved pelvic stability with walking    Baseline  4+/5 Lt    Status  On-going      PT LONG TERM GOAL #4   Title  Pt will report < or = to 18% limited FOTO for improved function    Baseline  29%    Status  On-going      PT LONG TERM GOAL #5   Title  --            Plan - 05/19/19 1626    Clinical Impression Statement  Pt is making ongoing progress with Lt hip and core stability strength.  Pt reports feeling about 80% better with PT and continues to be compliant with HEP.  Flexibility has normalized around Lt hip and LE.  Lt hip abd and ER are 4+/5 with a LTG of 5/5 strength.  She demonstrates improving Trendelenburg with gait but does display mild genu valgum bil.  She has difficulty controlling closed chain glute med with multi-directional stepping challenges.  PT used VC and mirror for feedback during these challenges.  Pt will continue to benefit from application of targeted hip strength into functional tasks such as gait, sit to stand, weight shifting and stepping strategies.  PT updated HEP today.  Pt will continue to benefit from further hip abduction progression in her HEP.  PT recommends frequency of 1x/week.  She will likely reach all goals within 4 more visits.    PT Frequency  1x / week    PT Duration  4 weeks    PT Treatment/Interventions  ADLs/Self Care Home Management;Cryotherapy;Electrical  Stimulation;Iontophoresis 4mg /ml Dexamethasone;Moist Heat;Therapeutic activities;Therapeutic exercise;Traction;Neuromuscular re-education;Patient/family education;Manual techniques;Taping;Dry needling;Passive range of motion    PT Next Visit Plan  continue Lt hip abd/ER training with application during sit to stand, stepping strategies, gait, progress HEP as needed    PT Home Exercise Plan  Access Code: [email or web address]    Recommended Other Services  check to see if re-cert is signed    Consulted and Agree with Plan of Care  Patient       Patient will benefit from skilled therapeutic intervention in order to improve the following deficits and impairments:     Visit Diagnosis: 1. Pain in left hip   2. Stiffness of left hip, not elsewhere classified   3. Muscle weakness (generalized)        Problem List Patient Active Problem List   Diagnosis Date Noted  . Status post total  replacement of left hip 07/20/2018  . Unilateral primary osteoarthritis, left hip 05/25/2018  . Genetic testing 11/14/2016  . Family history of ovarian cancer   . Family history of colon cancer   . Family history of uterine cancer   . Family history of stomach cancer   . Osteoarthritis of left knee 04/03/2015  . S/P total knee replacement using cement 04/03/2015  . Gait abnormality 12/22/2012  . Swelling of ankle 12/22/2012  . Ringworm   . Hypertension   . Fibroid   . Headache(784.0)   . Arthritis   . Vitamin D deficiency     Alene Mires Reyli Schroth 05/19/2019, 4:43 PM  [facility] [address] [place], [place], [address] Phone: [phone]   Fax:  [phone]  Name: PAILYN BELLEVUE MRN: [id number] Date of Birth: 1951-08-23

## 2019-05-23 DIAGNOSIS — I10 Essential (primary) hypertension: Secondary | ICD-10-CM | POA: Diagnosis not present

## 2019-05-23 DIAGNOSIS — M1632 Unilateral osteoarthritis resulting from hip dysplasia, left hip: Secondary | ICD-10-CM | POA: Diagnosis not present

## 2019-05-23 DIAGNOSIS — I350 Nonrheumatic aortic (valve) stenosis: Secondary | ICD-10-CM | POA: Diagnosis not present

## 2019-05-26 ENCOUNTER — Ambulatory Visit: Payer: PPO | Attending: Orthopaedic Surgery | Admitting: Physical Therapy

## 2019-05-26 ENCOUNTER — Encounter: Payer: Self-pay | Admitting: Physical Therapy

## 2019-05-26 ENCOUNTER — Other Ambulatory Visit: Payer: Self-pay

## 2019-05-26 DIAGNOSIS — M25652 Stiffness of left hip, not elsewhere classified: Secondary | ICD-10-CM | POA: Insufficient documentation

## 2019-05-26 DIAGNOSIS — M25552 Pain in left hip: Secondary | ICD-10-CM | POA: Diagnosis not present

## 2019-05-26 DIAGNOSIS — M6281 Muscle weakness (generalized): Secondary | ICD-10-CM | POA: Insufficient documentation

## 2019-05-26 NOTE — Patient Instructions (Signed)
Access Code: [email or web address]  URL: [email or web address]  Date: 05/26/2019  Prepared by: Jari Favre   Exercises  Supine Figure 4 Piriformis Stretch - 3 reps - 1 sets - 30 sec hold - 1x daily - 7x weekly  Hooklying Small March - 10 reps - 2 sets - 1x daily - 7x weekly  Ball squeeze with Kegel - 10 reps - 1 sets - 3 sec hold - 3x daily - 7x weekly  Clamshell - 10 reps - 1 sets - 1x daily - 7x weekly  Clamshell with Resistance - 10 reps - 1 sets - 1x daily - 7x weekly  Hip Adductors and Hamstring Stretch with Strap - 3 reps - 2 sets - 30 hold - 1x daily - 7x weekly  Prone Quadriceps Stretch with Strap - 3 reps - 1 sets - 30sec hold - 1x daily - 7x weekly  Isometric Gluteus Medius at Wall - 10 reps - 1 sets - 1x daily - 7x weekly  Hip Flexor Stretch on Step - 2 reps - 1 sets - 30 hold - 1x daily - 7x weekly  Sit to Stand - 10 reps - 1 sets - 1x daily - 7x weekly  Seated Hip Abduction with Resistance - 10 reps - 3 sets - 1x daily - 7x weekly  Hip Abduction with Resistance Loop - 10 reps - 3 sets - 1x daily - 7x weekly

## 2019-05-26 NOTE — Therapy (Signed)
[facility] [address] [place], [place], [address] Phone: [phone]   Fax:  [phone]  Physical Therapy Treatment  Patient Details  Name: Sharon [name] MRN: [id number] Date of Birth: November 11, 1950 Referring Provider (PT): Mcarthur Rossetti, MD   Encounter Date: 05/26/2019  PT End of Session - 05/26/19 0926    Visit Number  11    Date for PT Re-Evaluation  06/23/19    Authorization Type  [employer]    PT Start Time  0844    PT Stop Time  0922    PT Time Calculation (min)  38 min    Activity Tolerance  Patient tolerated treatment well    Behavior During Therapy  [facility] for tasks assessed/performed       Past Medical History:  Diagnosis Date  . Family history of adverse reaction to anesthesia    "mom did; don't know what happened" (07/20/2018)  . Family history of colon cancer   . Family history of ovarian cancer   . Family history of stomach cancer   . Family history of uterine cancer   . Fibroid   . Heart murmur    "tiny, tiny one" (07/20/2018)  . High cholesterol   . Hypertension   . Migraine    "a few/year; last one was 07/19/2018" (07/20/2018)  . Osteoarthritis    "knees, hips, hands, probably in my back" (07/20/2018)  . Ringworm   . Vitamin D deficiency     Past Surgical History:  Procedure Laterality Date  . APPENDECTOMY  1969  . BUNIONECTOMY Bilateral   . JOINT REPLACEMENT    . KNEE ARTHROSCOPY Left 11/2013  . Precancerous lesion excised     "arm"  . TONSILLECTOMY    . TOTAL ABDOMINAL HYSTERECTOMY  1990   TAH BSO  . TOTAL HIP ARTHROPLASTY Left 07/20/2018  . TOTAL HIP ARTHROPLASTY Left 07/20/2018   Procedure: LEFT TOTAL HIP ARTHROPLASTY ANTERIOR APPROACH;  Surgeon: Mcarthur Rossetti, MD;  Location: [place];  Service: Orthopedics;  Laterality: Left;  . TOTAL KNEE ARTHROPLASTY Right 2012  . TOTAL KNEE ARTHROPLASTY Left 04/03/2015   Procedure: TOTAL KNEE ARTHROPLASTY;   Surgeon: Garald Balding, MD;  Location: [place];  Service: Orthopedics;  Laterality: Left;    There were no vitals filed for this visit.  Subjective Assessment - 05/26/19 0850    Subjective  I misstepped on my Rt leg last thursday and still feeling sore in my back after that.  Pt denies pain on the Lt side    Currently in Pain?  Yes    Pain Score  3     Pain Location  Back    Pain Orientation  Right;Lower    Pain Descriptors / Indicators  Sore    Pain Type  Acute pain    Multiple Pain Sites  No                       [facility] Adult PT Treatment/Exercise - 05/26/19 0001      Knee/Hip Exercises: Stretches   Active Hamstring Stretch  Left;Right;30 seconds   standing at step    Hip Flexor Stretch  Both;30 seconds    Hip Flexor Stretch Limitations  on second step, lunge style      Knee/Hip Exercises: Aerobic   Nustep  L3 6 min PT present to review goals      Knee/Hip Exercises: Standing   Hip Abduction  Stengthening;Right;Left;10 reps  Abduction Limitations  green band around thighs back and forth stepping     Hip Extension  Stengthening;Right;Left;2 sets;5 sets    Extension Limitations  diagonal steps to the back green band around thighs     Step Down  Right;Left;2 sets;10 reps;Step Height: 2";Hand Hold: 2    Step Down Limitations  cues to keep pelviis and knee alignment neutral    Functional Squat  10 reps    Functional Squat Limitations  dowel behind back for posture with squats; then 10 reps dead lift no weight; 10 reps 4lb in each hand             PT Education - 05/26/19 0926    Education Details  Access Code: [email or web address]    Person(s) Educated  Patient    Methods  Explanation;Demonstration;Tactile cues;Verbal cues;Handout    Comprehension  Verbalized understanding;Returned demonstration       PT Short Term Goals - 04/28/19 1556      PT SHORT TERM GOAL #1   Title  independent with initial HEP    Status  Achieved      PT SHORT TERM GOAL #2   Title   Pt will report 30% reduction of pain    Baseline  75%    Status  Achieved      PT SHORT TERM GOAL #3   Title  The patient will have a 30% improvement in pain with dressing and night time discomfort    Status  Achieved        PT Long Term Goals - 05/19/19 1545      PT LONG TERM GOAL #1   Title  Pt will be independent and safe with advanced HEP    Status  On-going      PT LONG TERM GOAL #2   Title  Pt will report 2/10 pain at most due to improved core strength and stability    Status  Achieved      PT LONG TERM GOAL #3   Title  Pt will demonsrate hip abdcution and adduction strength of 5/5 MMT for improved pelvic stability with walking    Baseline  4+/5 Lt    Status  On-going      PT LONG TERM GOAL #4   Title  Pt will report < or = to 18% limited FOTO for improved function    Baseline  29%    Status  On-going      PT LONG TERM GOAL #5   Title  --            Plan - 05/26/19 2202    Clinical Impression Statement  Pt did well with adding functional movements emphasizing pelvic alignment and glute activation.  She was able to correctly do modified versions with smaller ROM of squat, step down and dead lift movements.  Pt will benefit from building on these functional movements for improved muscle activation.    PT Treatment/Interventions  ADLs/Self Care Home Management;Cryotherapy;Electrical Stimulation;Iontophoresis 4mg /ml Dexamethasone;Moist Heat;Therapeutic activities;Therapeutic exercise;Traction;Neuromuscular re-education;Patient/family education;Manual techniques;Taping;Dry needling;Passive range of motion    PT Next Visit Plan  continue Lt hip abd/ER and functional movement training with application during sit to stand, stepping strategies, gait, progress HEP as needed    PT Home Exercise Plan  Access Code: [email or web address]    Consulted and Agree with Plan of Care  Patient       Patient will benefit from skilled therapeutic intervention in order to improve the following  deficits and impairments:  Abnormal gait,  Increased muscle spasms, Decreased range of motion, Decreased strength, Pain  Visit Diagnosis: 1. Pain in left hip   2. Stiffness of left hip, not elsewhere classified   3. Muscle weakness (generalized)        Problem List Patient Active Problem List   Diagnosis Date Noted  . Status post total replacement of left hip 07/20/2018  . Unilateral primary osteoarthritis, left hip 05/25/2018  . Genetic testing 11/14/2016  . Family history of ovarian cancer   . Family history of colon cancer   . Family history of uterine cancer   . Family history of stomach cancer   . Osteoarthritis of left knee 04/03/2015  . S/P total knee replacement using cement 04/03/2015  . Gait abnormality 12/22/2012  . Swelling of ankle 12/22/2012  . Ringworm   . Hypertension   . Fibroid   . Headache(784.0)   . Arthritis   . Vitamin D deficiency     Jule Ser, PT 05/26/2019, 9:30 AM  [facility] [address], [place], [place], [address] Phone: [phone]   Fax:  [phone]  Name: CARINNA NEWHART MRN: [id number] Date of Birth: Nov 30, 1950

## 2019-06-02 ENCOUNTER — Encounter: Payer: Self-pay | Admitting: Physical Therapy

## 2019-06-02 ENCOUNTER — Other Ambulatory Visit: Payer: Self-pay

## 2019-06-02 ENCOUNTER — Ambulatory Visit: Payer: PPO | Admitting: Physical Therapy

## 2019-06-02 DIAGNOSIS — M25552 Pain in left hip: Secondary | ICD-10-CM | POA: Diagnosis not present

## 2019-06-02 DIAGNOSIS — M25652 Stiffness of left hip, not elsewhere classified: Secondary | ICD-10-CM

## 2019-06-02 DIAGNOSIS — M6281 Muscle weakness (generalized): Secondary | ICD-10-CM

## 2019-06-02 NOTE — Therapy (Signed)
[facility] [address] [place], [place], [address] Phone: [phone]   Fax:  [phone]  Physical Therapy Treatment  Patient Details  Name: Sharon [name] MRN: [id number] Date of Birth: Sep 29, 1951 Referring Provider (PT): Mcarthur Rossetti, MD   Encounter Date: 06/02/2019  PT End of Session - 06/02/19 0930    Visit Number  12    Date for PT Re-Evaluation  06/23/19    Authorization Type  [employer]    PT Start Time  0846    PT Stop Time  0927    PT Time Calculation (min)  41 min       Past Medical History:  Diagnosis Date  . Family history of adverse reaction to anesthesia    "mom did; don't know what happened" (07/20/2018)  . Family history of colon cancer   . Family history of ovarian cancer   . Family history of stomach cancer   . Family history of uterine cancer   . Fibroid   . Heart murmur    "tiny, tiny one" (07/20/2018)  . High cholesterol   . Hypertension   . Migraine    "a few/year; last one was 07/19/2018" (07/20/2018)  . Osteoarthritis    "knees, hips, hands, probably in my back" (07/20/2018)  . Ringworm   . Vitamin D deficiency     Past Surgical History:  Procedure Laterality Date  . APPENDECTOMY  1969  . BUNIONECTOMY Bilateral   . JOINT REPLACEMENT    . KNEE ARTHROSCOPY Left 11/2013  . Precancerous lesion excised     "arm"  . TONSILLECTOMY    . TOTAL ABDOMINAL HYSTERECTOMY  1990   TAH BSO  . TOTAL HIP ARTHROPLASTY Left 07/20/2018  . TOTAL HIP ARTHROPLASTY Left 07/20/2018   Procedure: LEFT TOTAL HIP ARTHROPLASTY ANTERIOR APPROACH;  Surgeon: Mcarthur Rossetti, MD;  Location: [place];  Service: Orthopedics;  Laterality: Left;  . TOTAL KNEE ARTHROPLASTY Right 2012  . TOTAL KNEE ARTHROPLASTY Left 04/03/2015   Procedure: TOTAL KNEE ARTHROPLASTY;  Surgeon: Garald Balding, MD;  Location: [place];  Service: Orthopedics;  Laterality: Left;    There were no vitals filed  for this visit.  Subjective Assessment - 06/02/19 0853    Subjective  Pt states the hip if feeling good.  States her back had issues before, but now it is flared up every since she misstepped last week.    Currently in Pain?  No/denies                       [facility] Adult PT Treatment/Exercise - 06/02/19 0001      Knee/Hip Exercises: Stretches   Active Hamstring Stretch  Left;Right;30 seconds   standing at step    Hip Flexor Stretch  Both;30 seconds    Hip Flexor Stretch Limitations  on second step, lunge style      Knee/Hip Exercises: Aerobic   Nustep  L3 2 min L1 x 67min PT present to review goals   forgot to adjust the resistance during the first 4 min     Knee/Hip Exercises: Standing   Hip Abduction  Stengthening;Right;Left;10 reps    Abduction Limitations  green band around thighs back and forth stepping     Hip Extension  Stengthening;Right;Left;2 sets;5 sets    Extension Limitations  diagonal steps to the back green band around thighs     Functional Squat  10 reps;3 sets    Functional Squat Limitations  dowel behind back for posture with squats; then 10 reps dead lift no weight; 10 reps 4lb in each hand    Wall Squat  2 sets;10 reps;5 seconds    Walking with Sports Cord  fwd/back - 5 laps each - 15#; side to side - 5 laps each 15#    Other Standing Knee Exercises  kneel with pallof punch yellow band               PT Short Term Goals - 04/28/19 1556      PT SHORT TERM GOAL #1   Title  independent with initial HEP    Status  Achieved      PT SHORT TERM GOAL #2   Title  Pt will report 30% reduction of pain    Baseline  75%    Status  Achieved      PT SHORT TERM GOAL #3   Title  The patient will have a 30% improvement in pain with dressing and night time discomfort    Status  Achieved        PT Long Term Goals - 05/19/19 1545      PT LONG TERM GOAL #1   Title  Pt will be independent and safe with advanced HEP    Status  On-going      PT  LONG TERM GOAL #2   Title  Pt will report 2/10 pain at most due to improved core strength and stability    Status  Achieved      PT LONG TERM GOAL #3   Title  Pt will demonsrate hip abdcution and adduction strength of 5/5 MMT for improved pelvic stability with walking    Baseline  4+/5 Lt    Status  On-going      PT LONG TERM GOAL #4   Title  Pt will report < or = to 18% limited FOTO for improved function    Baseline  29%    Status  On-going      PT LONG TERM GOAL #5   Title  --            Plan - 06/02/19 7893    Clinical Impression Statement  Pt did well with new exercises added today.  She needed only minimal cues to perform squat and dead lift correctly today.  Pt will benefit from 2 more visits to work on finalized HEP to ensure successful transition with HEP.    PT Treatment/Interventions  ADLs/Self Care Home Management;Cryotherapy;Electrical Stimulation;Iontophoresis 4mg /ml Dexamethasone;Moist Heat;Therapeutic activities;Therapeutic exercise;Traction;Neuromuscular re-education;Patient/family education;Manual techniques;Taping;Dry needling;Passive range of motion    PT Next Visit Plan  continue squat and deadlift Lt hip abd/ER and functional movement training with application during sit to stand, stepping strategies, gait, progress HEP as needed    PT Home Exercise Plan  Access Code: [email or web address]    Consulted and Agree with Plan of Care  Patient       Patient will benefit from skilled therapeutic intervention in order to improve the following deficits and impairments:  Abnormal gait, Increased muscle spasms, Decreased range of motion, Decreased strength, Pain  Visit Diagnosis: 1. Pain in left hip   2. Stiffness of left hip, not elsewhere classified   3. Muscle weakness (generalized)        Problem List Patient Active Problem List   Diagnosis Date Noted  . Status post total replacement of left hip 07/20/2018  . Unilateral primary osteoarthritis, left hip 05/25/2018  .  Genetic testing 11/14/2016  .  Family history of ovarian cancer   . Family history of colon cancer   . Family history of uterine cancer   . Family history of stomach cancer   . Osteoarthritis of left knee 04/03/2015  . S/P total knee replacement using cement 04/03/2015  . Gait abnormality 12/22/2012  . Swelling of ankle 12/22/2012  . Ringworm   . Hypertension   . Fibroid   . Headache(784.0)   . Arthritis   . Vitamin D deficiency     Jule Ser, PT 06/02/2019, 9:31 AM  [facility] [address] [place], [place], [address] Phone: [phone]   Fax:  [phone]  Name: Sharon [name] MRN: [id number] Date of Birth: May 05, 1951

## 2019-06-09 ENCOUNTER — Encounter: Payer: PPO | Admitting: Physical Therapy

## 2019-06-10 DIAGNOSIS — N390 Urinary tract infection, site not specified: Secondary | ICD-10-CM | POA: Diagnosis not present

## 2019-06-13 ENCOUNTER — Encounter: Payer: Self-pay | Admitting: Physical Therapy

## 2019-06-13 ENCOUNTER — Other Ambulatory Visit: Payer: Self-pay

## 2019-06-13 ENCOUNTER — Ambulatory Visit: Payer: PPO | Admitting: Physical Therapy

## 2019-06-13 DIAGNOSIS — M25652 Stiffness of left hip, not elsewhere classified: Secondary | ICD-10-CM

## 2019-06-13 DIAGNOSIS — M6281 Muscle weakness (generalized): Secondary | ICD-10-CM

## 2019-06-13 DIAGNOSIS — M25552 Pain in left hip: Secondary | ICD-10-CM

## 2019-06-13 NOTE — Patient Instructions (Signed)
Access Code: [email or web address]  URL: [email or web address]  Date: 06/13/2019  Prepared by: Jari Favre   Exercises  Supine Figure 4 Piriformis Stretch - 3 reps - 1 sets - 30 sec hold - 1x daily - 7x weekly  Hooklying Small March - 10 reps - 2 sets - 1x daily - 7x weekly  Ball squeeze with Kegel - 10 reps - 1 sets - 3 sec hold - 3x daily - 7x weekly  Clamshell - 10 reps - 1 sets - 1x daily - 7x weekly  Clamshell with Resistance - 10 reps - 1 sets - 1x daily - 7x weekly  Hip Adductors and Hamstring Stretch with Strap - 3 reps - 2 sets - 30 hold - 1x daily - 7x weekly  Prone Quadriceps Stretch with Strap - 3 reps - 1 sets - 30sec hold - 1x daily - 7x weekly  Isometric Gluteus Medius at Wall - 10 reps - 1 sets - 1x daily - 7x weekly  Hip Flexor Stretch on Step - 2 reps - 1 sets - 30 hold - 1x daily - 7x weekly  Sit to Stand - 10 reps - 1 sets - 1x daily - 7x weekly  Seated Hip Abduction with Resistance - 10 reps - 3 sets - 1x daily - 7x weekly  Hip Abduction with Resistance Loop - 10 reps - 3 sets - 1x daily - 7x weekly  Sidelying Hip Abduction - 10 reps - 3 sets - 1x daily - 7x weekly  Supine Diaphragmatic Breathing - 10 reps - 1 sets - 3x daily - 7x weekly

## 2019-06-13 NOTE — Therapy (Signed)
[facility] [address] [place], [place], [address] Phone: [phone]   Fax:  [phone]  Physical Therapy Treatment  Patient Details  Name: Sharon [name] MRN: [id number] Date of Birth: 30-Aug-1951 Referring Provider (PT): Mcarthur Rossetti, MD   Encounter Date: 06/13/2019  PT End of Session - 06/13/19 1143    Visit Number  13    Date for PT Re-Evaluation  06/23/19    Authorization Type  [employer]    PT Start Time  1143    PT Stop Time  1224    PT Time Calculation (min)  41 min    Activity Tolerance  Patient tolerated treatment well    Behavior During Therapy  [facility] for tasks assessed/performed       Past Medical History:  Diagnosis Date  . Family history of adverse reaction to anesthesia    "mom did; don't know what happened" (07/20/2018)  . Family history of colon cancer   . Family history of ovarian cancer   . Family history of stomach cancer   . Family history of uterine cancer   . Fibroid   . Heart murmur    "tiny, tiny one" (07/20/2018)  . High cholesterol   . Hypertension   . Migraine    "a few/year; last one was 07/19/2018" (07/20/2018)  . Osteoarthritis    "knees, hips, hands, probably in my back" (07/20/2018)  . Ringworm   . Vitamin D deficiency     Past Surgical History:  Procedure Laterality Date  . APPENDECTOMY  1969  . BUNIONECTOMY Bilateral   . JOINT REPLACEMENT    . KNEE ARTHROSCOPY Left 11/2013  . Precancerous lesion excised     "arm"  . TONSILLECTOMY    . TOTAL ABDOMINAL HYSTERECTOMY  1990   TAH BSO  . TOTAL HIP ARTHROPLASTY Left 07/20/2018  . TOTAL HIP ARTHROPLASTY Left 07/20/2018   Procedure: LEFT TOTAL HIP ARTHROPLASTY ANTERIOR APPROACH;  Surgeon: Mcarthur Rossetti, MD;  Location: [place];  Service: Orthopedics;  Laterality: Left;  . TOTAL KNEE ARTHROPLASTY Right 2012  . TOTAL KNEE ARTHROPLASTY Left 04/03/2015   Procedure: TOTAL KNEE ARTHROPLASTY;   Surgeon: Garald Balding, MD;  Location: [place];  Service: Orthopedics;  Laterality: Left;    There were no vitals filed for this visit.  Subjective Assessment - 06/13/19 1149    Subjective  I had a UTI and that was uncomfortable in the whole lower abdomen and groin area.  Today I am okay.  My backis still bothering me a little.    Currently in Pain?  Yes    Pain Score  3     Pain Location  Back    Pain Orientation  Mid    Pain Descriptors / Indicators  Sore    Pain Type  Chronic pain    Pain Onset  More than a month ago    Aggravating Factors   comes and goes but it has been a little more irritated lately    Multiple Pain Sites  No                       [facility] Adult PT Treatment/Exercise - 06/13/19 0001      Neuro Re-ed    Neuro Re-ed Details   educated and performed diaphragmatic breathing      Knee/Hip Exercises: Stretches   Active Hamstring Stretch  Left;Right;30 seconds   supine and      Knee/Hip  Exercises: Aerobic   Nustep  L3 6 min;  PT present to review goals      Knee/Hip Exercises: Machines for Strengthening   Cybex Leg Press  seat 7; bilat 100lb; single leg 45 lb 20x each       Knee/Hip Exercises: Standing   Hip Abduction  Stengthening;Right;Left;10 reps    Abduction Limitations  red band around ankles             PT Education - 06/13/19 1225    Education Details  Access Code: [email or web address]    Person(s) Educated  Patient    Methods  Explanation;Demonstration;Verbal cues;Handout;Tactile cues    Comprehension  Verbalized understanding;Returned demonstration       PT Short Term Goals - 04/28/19 1556      PT SHORT TERM GOAL #1   Title  independent with initial HEP    Status  Achieved      PT SHORT TERM GOAL #2   Title  Pt will report 30% reduction of pain    Baseline  75%    Status  Achieved      PT SHORT TERM GOAL #3   Title  The patient will have a 30% improvement in pain with dressing and night time discomfort    Status  Achieved         PT Long Term Goals - 05/19/19 1545      PT LONG TERM GOAL #1   Title  Pt will be independent and safe with advanced HEP    Status  On-going      PT LONG TERM GOAL #2   Title  Pt will report 2/10 pain at most due to improved core strength and stability    Status  Achieved      PT LONG TERM GOAL #3   Title  Pt will demonsrate hip abdcution and adduction strength of 5/5 MMT for improved pelvic stability with walking    Baseline  4+/5 Lt    Status  On-going      PT LONG TERM GOAL #4   Title  Pt will report < or = to 18% limited FOTO for improved function    Baseline  29%    Status  On-going      PT LONG TERM GOAL #5   Title  --            Plan - 06/13/19 1226    Clinical Impression Statement  Pt did well with additional exercises.  She was able to increase difficutly of hip abduction exercise.  She did need some verbal and tactile cues to keep pelvis neutral.  She will benefit fom skilled PT in order to review and finalize HEP in order to ensure successful transition to maximum function.    PT Treatment/Interventions  ADLs/Self Care Home Management;Cryotherapy;Electrical Stimulation;Iontophoresis 4mg /ml Dexamethasone;Moist Heat;Therapeutic activities;Therapeutic exercise;Traction;Neuromuscular re-education;Patient/family education;Manual techniques;Taping;Dry needling;Passive range of motion    PT Next Visit Plan  discharge next, lifting technique and review sidelying hip abduction    PT Home Exercise Plan  Access Code: [email or web address]    Consulted and Agree with Plan of Care  Patient       Patient will benefit from skilled therapeutic intervention in order to improve the following deficits and impairments:  Abnormal gait, Increased muscle spasms, Decreased range of motion, Decreased strength, Pain  Visit Diagnosis: Pain in left hip  Stiffness of left hip, not elsewhere classified  Muscle weakness (generalized)     Problem List Patient Active Problem List  Diagnosis Date Noted  . Status post total replacement of left hip 07/20/2018  . Unilateral primary osteoarthritis, left hip 05/25/2018  . Genetic testing 11/14/2016  . Family history of ovarian cancer   . Family history of colon cancer   . Family history of uterine cancer   . Family history of stomach cancer   . Osteoarthritis of left knee 04/03/2015  . S/P total knee replacement using cement 04/03/2015  . Gait abnormality 12/22/2012  . Swelling of ankle 12/22/2012  . Ringworm   . Hypertension   . Fibroid   . Headache(784.0)   . Arthritis   . Vitamin D deficiency     Jule Ser, PT 06/13/2019, 12:28 PM  [facility] [address], [place], [place], [address] Phone: [phone]   Fax:  [phone]  Name: REASE BAUDOIN MRN: [id number] Date of Birth: [birth year]

## 2019-06-16 ENCOUNTER — Other Ambulatory Visit: Payer: Self-pay

## 2019-06-16 ENCOUNTER — Ambulatory Visit: Payer: PPO | Admitting: Physical Therapy

## 2019-06-16 ENCOUNTER — Encounter: Payer: Self-pay | Admitting: Physical Therapy

## 2019-06-16 DIAGNOSIS — M25552 Pain in left hip: Secondary | ICD-10-CM | POA: Diagnosis not present

## 2019-06-16 DIAGNOSIS — M25652 Stiffness of left hip, not elsewhere classified: Secondary | ICD-10-CM

## 2019-06-16 DIAGNOSIS — M6281 Muscle weakness (generalized): Secondary | ICD-10-CM

## 2019-06-16 NOTE — Therapy (Signed)
[facility] [address] [place], [place], [address] Phone: [phone]   Fax:  [phone]  Physical Therapy Treatment  Patient Details  Name: Sharon [name] MRN: [id number] Date of Birth: June 10, 1951 Referring Provider (PT): Mcarthur Rossetti, MD   Encounter Date: 06/16/2019  PT End of Session - 06/16/19 0850    Visit Number  14    Date for PT Re-Evaluation  06/23/19    Authorization Type  [employer]    PT Start Time  0846    PT Stop Time  0927    PT Time Calculation (min)  41 min    Activity Tolerance  Patient tolerated treatment well    Behavior During Therapy  [facility] for tasks assessed/performed       Past Medical History:  Diagnosis Date  . Family history of adverse reaction to anesthesia    "mom did; don't know what happened" (07/20/2018)  . Family history of colon cancer   . Family history of ovarian cancer   . Family history of stomach cancer   . Family history of uterine cancer   . Fibroid   . Heart murmur    "tiny, tiny one" (07/20/2018)  . High cholesterol   . Hypertension   . Migraine    "a few/year; last one was 07/19/2018" (07/20/2018)  . Osteoarthritis    "knees, hips, hands, probably in my back" (07/20/2018)  . Ringworm   . Vitamin D deficiency     Past Surgical History:  Procedure Laterality Date  . APPENDECTOMY  1969  . BUNIONECTOMY Bilateral   . JOINT REPLACEMENT    . KNEE ARTHROSCOPY Left 11/2013  . Precancerous lesion excised     "arm"  . TONSILLECTOMY    . TOTAL ABDOMINAL HYSTERECTOMY  1990   TAH BSO  . TOTAL HIP ARTHROPLASTY Left 07/20/2018  . TOTAL HIP ARTHROPLASTY Left 07/20/2018   Procedure: LEFT TOTAL HIP ARTHROPLASTY ANTERIOR APPROACH;  Surgeon: Mcarthur Rossetti, MD;  Location: [place];  Service: Orthopedics;  Laterality: Left;  . TOTAL KNEE ARTHROPLASTY Right 2012  . TOTAL KNEE ARTHROPLASTY Left 04/03/2015   Procedure: TOTAL KNEE ARTHROPLASTY;   Surgeon: Garald Balding, MD;  Location: [place];  Service: Orthopedics;  Laterality: Left;    There were no vitals filed for this visit.  Subjective Assessment - 06/16/19 0853    Subjective  Pt has a migrain this morning.  Back and hip are feeling good    Currently in Pain?  No/denies         [facility] PT Assessment - 06/16/19 0001      Observation/Other Assessments   Focus on Therapeutic Outcomes (FOTO)   1%      Strength   Left Hip Flexion  5/5    Left Hip Extension  5/5    Left Hip External Rotation  5/5    Left Hip Internal Rotation  5/5    Left Hip ABduction  5/5    Left Hip ADduction  5/5                   [facility] Adult PT Treatment/Exercise - 06/16/19 0001      Knee/Hip Exercises: Stretches   Active Hamstring Stretch  Left;Right;30 seconds   supine and    Hip Flexor Stretch  Both;30 seconds    Hip Flexor Stretch Limitations  on second step, lunge style    ITB Stretch  Right;Left;2 reps;30 seconds    Other Knee/Hip Stretches  adductor stretch 3 x 20 sec      Knee/Hip Exercises: Aerobic   Nustep  L1-2 6 min;  PT present to review goals   did lower resistance due to feeling off from HA     Knee/Hip Exercises: Machines for Strengthening   Cybex Leg Press  seat 7; bilat 100lb; single leg 45 lb 20x each       Knee/Hip Exercises: Sidelying   Clams  green band x 10 reps each, Pt with good demo of postural set up to stack pelvis               PT Short Term Goals - 04/28/19 1556      PT SHORT TERM GOAL #1   Title  independent with initial HEP    Status  Achieved      PT SHORT TERM GOAL #2   Title  Pt will report 30% reduction of pain    Baseline  75%    Status  Achieved      PT SHORT TERM GOAL #3   Title  The patient will have a 30% improvement in pain with dressing and night time discomfort    Status  Achieved        PT Long Term Goals - 06/16/19 5638      PT LONG TERM GOAL #1   Title  Pt will be independent and safe with advanced HEP     Status  Achieved      PT LONG TERM GOAL #2   Title  Pt will report 2/10 pain at most due to improved core strength and stability    Status  Achieved      PT LONG TERM GOAL #3   Title  Pt will demonsrate hip abdcution and adduction strength of 5/5 MMT for improved pelvic stability with walking    Baseline  5/5    Status  Achieved      PT LONG TERM GOAL #4   Title  Pt will report < or = to 18% limited FOTO for improved function    Baseline  1%    Status  Achieved            Plan - 06/16/19 0916    Clinical Impression Statement  Pt has been able to reduce FOTO down to 1% limitation which exceeded goal set by the standardized tool.  pt is ind with HEP and was able to demonstrate good form and technique with exercises.  She is recommended to discharge from PT with HEP today.    PT Treatment/Interventions  ADLs/Self Care Home Management;Cryotherapy;Electrical Stimulation;Iontophoresis 28m/ml Dexamethasone;Moist Heat;Therapeutic activities;Therapeutic exercise;Traction;Neuromuscular re-education;Patient/family education;Manual techniques;Taping;Dry needling;Passive range of motion    PT Next Visit Plan  d/c today    PT Home Exercise Plan  Access Code: [email or web address]   Consulted and Agree with Plan of Care  Patient       Patient will benefit from skilled therapeutic intervention in order to improve the following deficits and impairments:  Abnormal gait, Increased muscle spasms, Decreased range of motion, Decreased strength, Pain  Visit Diagnosis: Pain in left hip  Stiffness of left hip, not elsewhere classified  Muscle weakness (generalized)     Problem List Patient Active Problem List   Diagnosis Date Noted  . Status post total replacement of left hip 07/20/2018  . Unilateral primary osteoarthritis, left hip 05/25/2018  . Genetic testing 11/14/2016  . Family history of ovarian cancer   . Family history of colon cancer   .  Family history of uterine cancer   . Family history  of stomach cancer   . Osteoarthritis of left knee 04/03/2015  . S/P total knee replacement using cement 04/03/2015  . Gait abnormality 12/22/2012  . Swelling of ankle 12/22/2012  . Ringworm   . Hypertension   . Fibroid   . Headache(784.0)   . Arthritis   . Vitamin D deficiency     Jule Ser, PT 06/16/2019, 9:29 AM  [facility] [address] [place], [place], [address] Phone: [phone]   Fax:  [phone]  Name: Sharon [name] MRN: [id number] Date of Birth: 1951/05/21  PHYSICAL THERAPY DISCHARGE SUMMARY  Visits from Start of Care: 14  Current functional level related to goals / functional outcomes: See above goals   Remaining deficits: See Jethro Poling   Education / Equipment: HEP  Plan: Patient agrees to discharge.  Patient goals were met. Patient is being discharged due to meeting the stated rehab goals.  ?????    [employer], PT 06/16/19 9:30 AM

## 2019-07-26 ENCOUNTER — Encounter: Payer: Self-pay | Admitting: Orthopaedic Surgery

## 2019-07-26 ENCOUNTER — Other Ambulatory Visit: Payer: Self-pay

## 2019-07-26 ENCOUNTER — Ambulatory Visit (INDEPENDENT_AMBULATORY_CARE_PROVIDER_SITE_OTHER): Payer: PPO

## 2019-07-26 ENCOUNTER — Ambulatory Visit: Payer: PPO | Admitting: Orthopaedic Surgery

## 2019-07-26 VITALS — BP 128/82 | HR 74 | Ht 67.0 in | Wt 157.0 lb

## 2019-07-26 DIAGNOSIS — M25571 Pain in right ankle and joints of right foot: Secondary | ICD-10-CM

## 2019-07-26 NOTE — Progress Notes (Signed)
Office Visit Note   Patient: Sharon [name]           Date of Birth: 06-09-1951           MRN: [id number] Visit Date: 07/26/2019              Requested by: Levin Erp, MD [place] [place],  [place] [address] PCP: Levin Erp, MD   Assessment & Plan: Visit Diagnoses:  1. Pain in right ankle and joints of right foot     Plan: Painful right foot appears to be localized to the medial aspect of the ankle and foot.  I suspect that there may be some insufficiency of the posterior tibial tendon with some localized swelling and tenderness.  There also is a flatfoot with pronation.  Long discussion regarding potential diagnoses.  Will try arch support initially and if no improvement over the next 4 to 6 weeks would consider an MRI scan of her ankle.  Follow-Up Instructions: Return if symptoms worsen or fail to improve.   Orders:  Orders Placed This Encounter  Procedures  . XR Foot Complete Right   No orders of the defined types were placed in this encounter.     Procedures: No procedures performed   Clinical Data: No additional findings.   Subjective: Chief Complaint  Patient presents with  . Right Foot - Pain  Patient presents today for right foot pain. No known injury. She has been having pain on and off for about a month. She does recall stepping down hard from a step a month ago, but did not have immediate pain. Her pain is located across the top of her foot with weightbearing. Her foot feels better with rest. She takes Advil as needed. She does report some swelling at times in her foot and ankle.  Prior lateral ankle ligament tightening years ago with good relief.  No recent injury or trauma.  Present pain seems to be localized along the medial aspect of her ankle and foot.  At one time had inserts for pronation  HPI  Review of Systems  Constitutional: Negative for fatigue.  HENT: Negative for ear pain.   Eyes: Negative for pain.  Respiratory:  Negative for shortness of breath.   Cardiovascular: Negative for leg swelling.  Gastrointestinal: Negative for constipation and diarrhea.  Endocrine: Negative for cold intolerance and heat intolerance.  Genitourinary: Negative for difficulty urinating.  Musculoskeletal: Positive for joint swelling.  Skin: Negative for rash.  Allergic/Immunologic: Negative for food allergies.  Neurological: Negative for weakness.  Hematological: Does not bruise/bleed easily.  Psychiatric/Behavioral: Positive for sleep disturbance.     Objective: Vital Signs: BP 128/82   Pulse 74   Ht 5\' 7"  (1.702 m)   Wt 157 lb (71.2 kg)   LMP 02/17/1989   BMI 24.59 kg/m   Physical Exam Constitutional:      Appearance: She is well-developed.  Eyes:     Pupils: Pupils are equal, round, and reactive to light.  Pulmonary:     Effort: Pulmonary effort is normal.  Skin:    General: Skin is warm and dry.  Neurological:     Mental Status: She is alert and oriented to person, place, and time.  Psychiatric:        Behavior: Behavior normal.     Ortho Exam awake alert and oriented x3.  Comfortable sitting.  Right foot with significant pronation with weightbearing.  There is some tenderness along the posterior tibial tendon behind  the medial malleolus and some mild nonpitting ankle swelling.  No pain laterally.  Some palpable osteophytes dorsally at the midfoot but sure not tender.  Minimal loss of dorsiflexion of the great toe metatarsal phalangeal joint.  Good capillary refill to toes.  Motor exam intact.  No plantar heel pain.  No pain about the Achilles.  Specialty Comments:  No specialty comments available.  Imaging: Xr Foot Complete Right  Result Date: 07/26/2019 Films of the right foot were obtained and several projections.  No acute changes.  There are midfoot degenerative changes with anterior spurring.  There is a small plantar heel spur.  Some degenerative changes at the first metatarsal phalangeal  joint.  Ankle joint appears to be intact.  Decreased midfoot arch    PMFS History: Patient Active Problem List   Diagnosis Date Noted  . Pain in right ankle and joints of right foot 07/26/2019  . Status post total replacement of left hip 07/20/2018  . Unilateral primary osteoarthritis, left hip 05/25/2018  . Genetic testing 11/14/2016  . Family history of ovarian cancer   . Family history of colon cancer   . Family history of uterine cancer   . Family history of stomach cancer   . Osteoarthritis of left knee 04/03/2015  . S/P total knee replacement using cement 04/03/2015  . Gait abnormality 12/22/2012  . Swelling of ankle 12/22/2012  . Ringworm   . Hypertension   . Fibroid   . Headache(784.0)   . Arthritis   . Vitamin D deficiency    Past Medical History:  Diagnosis Date  . Family history of adverse reaction to anesthesia    "mom did; don't know what happened" (07/20/2018)  . Family history of colon cancer   . Family history of ovarian cancer   . Family history of stomach cancer   . Family history of uterine cancer   . Fibroid   . Heart murmur    "tiny, tiny one" (07/20/2018)  . High cholesterol   . Hypertension   . Migraine    "a few/year; last one was 07/19/2018" (07/20/2018)  . Osteoarthritis    "knees, hips, hands, probably in my back" (07/20/2018)  . Ringworm   . Vitamin D deficiency     Family History  Problem Relation Age of Onset  . Ovarian cancer Mother 39  . Heart disease Maternal Grandfather   . Hypertension Paternal Grandmother   . Ovarian cancer Cousin        maternal first cousin dx in her 59s  . Heart disease Father   . Atrial fibrillation Father   . Heart disease Brother   . Heart disease Brother   . Colon cancer Maternal Aunt        dx in her 82s-60  . Diabetes Paternal Aunt   . Ovarian cancer Maternal Grandmother        dx in her mid 47s  . Cancer Maternal Grandmother        OVARIAN AND UTERINE  . Uterine cancer Maternal Grandmother         dx in her early 32s  . Stomach cancer Maternal Uncle        dx in his 46s-80s  . Stroke Maternal Uncle   . Leukemia Paternal Uncle     Past Surgical History:  Procedure Laterality Date  . APPENDECTOMY  1969  . BUNIONECTOMY Bilateral   . JOINT REPLACEMENT    . KNEE ARTHROSCOPY Left 11/2013  . Precancerous lesion excised     "  arm"  . TONSILLECTOMY    . TOTAL ABDOMINAL HYSTERECTOMY  1990   TAH BSO  . TOTAL HIP ARTHROPLASTY Left 07/20/2018  . TOTAL HIP ARTHROPLASTY Left 07/20/2018   Procedure: LEFT TOTAL HIP ARTHROPLASTY ANTERIOR APPROACH;  Surgeon: Mcarthur Rossetti, MD;  Location: [place];  Service: Orthopedics;  Laterality: Left;  . TOTAL KNEE ARTHROPLASTY Right 2012  . TOTAL KNEE ARTHROPLASTY Left 04/03/2015   Procedure: TOTAL KNEE ARTHROPLASTY;  Surgeon: Garald Balding, MD;  Location: [place];  Service: Orthopedics;  Laterality: Left;   Social History   Occupational History  . Not on file  Tobacco Use  . Smoking status: Never Smoker  . Smokeless tobacco: Never Used  Substance and Sexual Activity  . Alcohol use: Yes    Alcohol/week: 1.0 - 2.0 standard drinks    Types: 1 - 2 Standard drinks or equivalent per week  . Drug use: No  . Sexual activity: Not Currently    Partners: Male    Birth control/protection: Surgical, Post-menopausal

## 2019-07-28 ENCOUNTER — Ambulatory Visit: Payer: PPO | Admitting: Orthopaedic Surgery

## 2019-08-01 ENCOUNTER — Ambulatory Visit: Payer: PPO | Admitting: Orthopaedic Surgery

## 2019-08-01 DIAGNOSIS — N390 Urinary tract infection, site not specified: Secondary | ICD-10-CM | POA: Diagnosis not present

## 2019-08-02 ENCOUNTER — Ambulatory Visit (INDEPENDENT_AMBULATORY_CARE_PROVIDER_SITE_OTHER): Payer: PPO | Admitting: Orthopaedic Surgery

## 2019-08-02 ENCOUNTER — Other Ambulatory Visit: Payer: Self-pay

## 2019-08-02 ENCOUNTER — Encounter: Payer: Self-pay | Admitting: Orthopaedic Surgery

## 2019-08-02 DIAGNOSIS — G8929 Other chronic pain: Secondary | ICD-10-CM

## 2019-08-02 DIAGNOSIS — Z96642 Presence of left artificial hip joint: Secondary | ICD-10-CM | POA: Diagnosis not present

## 2019-08-02 DIAGNOSIS — M533 Sacrococcygeal disorders, not elsewhere classified: Secondary | ICD-10-CM

## 2019-08-02 NOTE — Progress Notes (Signed)
Office Visit Note   Patient: Sharon [name]           Date of Birth: 1951-09-08           MRN: [id number] Visit Date: 08/02/2019              Requested by: Levin Erp, MD [place], [address],  [place] [id number] PCP: Levin Erp, MD   Assessment & Plan: Visit Diagnoses:  1. Chronic left SI joint pain     Plan: We will send her for SI joint injection with Dr. Ernestina Patches on the left.  Have her follow-up with [place] in a month to see what type of response she has this.  Despite for diagnostic and therapeutic purposes.  Questions encouraged and answered.  Follow-Up Instructions: Return 1 month after injection.   Orders:  No orders of the defined types were placed in this encounter.  No orders of the defined types were placed in this encounter.     Procedures: No procedures performed   Clinical Data: No additional findings.   Subjective: Chief Complaint  Patient presents with  . Right Ankle - Follow-up  . Right Foot - Follow-up    HPI Sharon [name] comes in today for follow-up of her left total hip arthroplasty she is 1 year out.  She had no problems with the hip the hip is doing quite well.  Main complaint is still pain in her SI joint region on the left.  She is having no radicular symptoms down the leg.  She states physical therapy helps with his but she is not doing a home exercise program.  She is asked about an injection. Review of Systems Negative for fevers chills shortness of breath chest pain.  Objective: Vital Signs: LMP 02/17/1989   Physical Exam Constitutional:      Appearance: She is not ill-appearing or diaphoretic.  Pulmonary:     Effort: Pulmonary effort is normal.  Neurological:     Mental Status: She is alert and oriented to person, place, and time.     Ortho Exam Mild hips good range of motion without pain.  Faber's negative.  She has tenderness over the left SI joint region. Specialty Comments:  No specialty comments  available.  Imaging: No results found.   PMFS History: Patient Active Problem List   Diagnosis Date Noted  . Pain in right ankle and joints of right foot 07/26/2019  . Status post total replacement of left hip 07/20/2018  . Unilateral primary osteoarthritis, left hip 05/25/2018  . Genetic testing 11/14/2016  . Family history of ovarian cancer   . Family history of colon cancer   . Family history of uterine cancer   . Family history of stomach cancer   . Osteoarthritis of left knee 04/03/2015  . S/P total knee replacement using cement 04/03/2015  . Gait abnormality 12/22/2012  . Swelling of ankle 12/22/2012  . Ringworm   . Hypertension   . Fibroid   . Headache(784.0)   . Arthritis   . Vitamin D deficiency    Past Medical History:  Diagnosis Date  . Family history of adverse reaction to anesthesia    "mom did; don't know what happened" (07/20/2018)  . Family history of colon cancer   . Family history of ovarian cancer   . Family history of stomach cancer   . Family history of uterine cancer   . Fibroid   . Heart murmur    "tiny, tiny one" (07/20/2018)  .  High cholesterol   . Hypertension   . Migraine    "a few/year; last one was 07/19/2018" (07/20/2018)  . Osteoarthritis    "knees, hips, hands, probably in my back" (07/20/2018)  . Ringworm   . Vitamin D deficiency     Family History  Problem Relation [name] of Onset  . Ovarian cancer Mother 57  . Heart disease Maternal Grandfather   . Hypertension Paternal Grandmother   . Ovarian cancer Cousin        maternal first cousin dx in her 34s  . Heart disease Father   . Atrial fibrillation Father   . Heart disease Brother   . Heart disease Brother   . Colon cancer Maternal Aunt        dx in her 75s-60  . Diabetes Paternal Aunt   . Ovarian cancer Maternal Grandmother        dx in her mid 49s  . Cancer Maternal Grandmother        OVARIAN AND UTERINE  . Uterine cancer Maternal Grandmother        dx in her early 18s  .  Stomach cancer Maternal Uncle        dx in his 21s-80s  . Stroke Maternal Uncle   . Leukemia Paternal Uncle     Past Surgical History:  Procedure Laterality Date  . APPENDECTOMY  1969  . BUNIONECTOMY Bilateral   . JOINT REPLACEMENT    . KNEE ARTHROSCOPY Left 11/2013  . Precancerous lesion excised     "arm"  . TONSILLECTOMY    . TOTAL ABDOMINAL HYSTERECTOMY  1990   TAH BSO  . TOTAL HIP ARTHROPLASTY Left 07/20/2018  . TOTAL HIP ARTHROPLASTY Left 07/20/2018   Procedure: LEFT TOTAL HIP ARTHROPLASTY ANTERIOR APPROACH;  Surgeon: Mcarthur Rossetti, MD;  Location: [facility];  Service: Orthopedics;  Laterality: Left;  . TOTAL KNEE ARTHROPLASTY Right 2012  . TOTAL KNEE ARTHROPLASTY Left 04/03/2015   Procedure: TOTAL KNEE ARTHROPLASTY;  Surgeon: Garald Balding, MD;  Location: [facility];  Service: Orthopedics;  Laterality: Left;   Social History   Occupational History  . Not on file  Tobacco Use  . Smoking status: Never Smoker  . Smokeless tobacco: Never Used  Substance and Sexual Activity  . Alcohol use: Yes    Alcohol/week: 1.0 - 2.0 standard drinks    Types: 1 - 2 Standard drinks or equivalent per week  . Drug use: No  . Sexual activity: Not Currently    Partners: Male    Birth control/protection: Surgical, Post-menopausal

## 2019-08-05 DIAGNOSIS — D2371 Other benign neoplasm of skin of right lower limb, including hip: Secondary | ICD-10-CM | POA: Diagnosis not present

## 2019-08-05 DIAGNOSIS — L814 Other melanin hyperpigmentation: Secondary | ICD-10-CM | POA: Diagnosis not present

## 2019-08-05 DIAGNOSIS — D1801 Hemangioma of skin and subcutaneous tissue: Secondary | ICD-10-CM | POA: Diagnosis not present

## 2019-08-05 DIAGNOSIS — L738 Other specified follicular disorders: Secondary | ICD-10-CM | POA: Diagnosis not present

## 2019-08-05 DIAGNOSIS — L821 Other seborrheic keratosis: Secondary | ICD-10-CM | POA: Diagnosis not present

## 2019-09-07 DIAGNOSIS — J208 Acute bronchitis due to other specified organisms: Secondary | ICD-10-CM | POA: Diagnosis not present

## 2019-09-14 ENCOUNTER — Encounter: Payer: Self-pay | Admitting: Obstetrics and Gynecology

## 2019-09-14 DIAGNOSIS — Z1231 Encounter for screening mammogram for malignant neoplasm of breast: Secondary | ICD-10-CM | POA: Diagnosis not present

## 2019-10-24 ENCOUNTER — Other Ambulatory Visit: Payer: Self-pay

## 2019-10-24 NOTE — Progress Notes (Signed)
69 y.o. G44P2002 Married White or Caucasian Not Hispanic or Latino female here for annual exam.  [name] states that she has been having discomfort in her lower back, hip, and pelvic area. Issues with her SI joint. She has been to PT, was going to get a shot.  She has new orthotics from Ortho, not helping. Her back and pelvis hurt more after she walks. Sometimes hurts when she is resting.  H/O TAH/BSO. She has had 4 UTI's in the last year. She is staying well hydrated, drinking cranberry juice. Last UTI was in the early fall. Not sexually active, husband with ED.  She was seen previously by Urology for microscopic hematuria.    She has a FH of ovarian and colon cancer. She has had negative BRCA testing and negative testing for Lynch. S/P TAH/BSO.  Patient's last menstrual period was 02/17/1989.          Sexually active: No.  The current method of family planning is none.    Exercising: Yes.    Walking and home workouts Smoker:  no  Health Maintenance: Pap:2013 WNL History of abnormal Pap:no MMG: 11/20 normal Requested report  Colonoscopy:07-21-14 mild diverticulosis, thinks due, she will call   BMD:09-22-16 WNL TDaP:UTD per patient Gardasil:N/A   reports that she has never smoked. She has never used smokeless tobacco. She reports current alcohol use of about 1.0 - 2.0 standard drinks of alcohol per week. She reports that she does not use drugs. Retired Pharmacist, hospital. She has 2 sons, not local, no grandchildren.   Past Medical History:  Diagnosis Date  . Family history of adverse reaction to anesthesia    "mom did; don't know what happened" (07/20/2018)  . Family history of colon cancer   . Family history of ovarian cancer   . Family history of stomach cancer   . Family history of uterine cancer   . Fibroid   . Heart murmur    "tiny, tiny one" (07/20/2018)  . High cholesterol   . Hypertension   . Migraine    "a few/year; last one was 07/19/2018" (07/20/2018)  . Osteoarthritis     "knees, hips, hands, probably in my back" (07/20/2018)  . Ringworm   . Vitamin D deficiency     Past Surgical History:  Procedure Laterality Date  . APPENDECTOMY  1969  . BUNIONECTOMY Bilateral   . JOINT REPLACEMENT    . KNEE ARTHROSCOPY Left 11/2013  . Precancerous lesion excised     "arm"  . TONSILLECTOMY    . TOTAL ABDOMINAL HYSTERECTOMY  1990   TAH BSO  . TOTAL HIP ARTHROPLASTY Left 07/20/2018  . TOTAL HIP ARTHROPLASTY Left 07/20/2018   Procedure: LEFT TOTAL HIP ARTHROPLASTY ANTERIOR APPROACH;  Surgeon: Mcarthur Rossetti, MD;  Location: [facility];  Service: Orthopedics;  Laterality: Left;  . TOTAL KNEE ARTHROPLASTY Right 2012  . TOTAL KNEE ARTHROPLASTY Left 04/03/2015   Procedure: TOTAL KNEE ARTHROPLASTY;  Surgeon: Garald Balding, MD;  Location: [facility];  Service: Orthopedics;  Laterality: Left;    Current Outpatient Medications  Medication Sig Dispense Refill  . atorvastatin (LIPITOR) 10 MG tablet Take 10 mg by mouth daily.    . Calcium Carbonate-Vitamin D (CALCIUM 500 + D PO) Take 2 each by mouth daily. Gummies    . cycloSPORINE (RESTASIS) 0.05 % ophthalmic emulsion Place 1 drop into both eyes 2 (two) times daily.     . fluticasone (FLONASE) 50 MCG/ACT nasal spray Place 1 spray into both nostrils daily as needed  for allergies (spring/fall).     . hydrochlorothiazide (HYDRODIURIL) 25 MG tablet Take 25 mg by mouth daily.    . methocarbamol (ROBAXIN) 500 MG tablet Take 1 tablet (500 mg total) by mouth every 6 (six) hours as needed for muscle spasms. 40 tablet 1  . methylPREDNISolone (MEDROL) 4 MG tablet Medrol dose pack. Take as instructed (Patient not taking: Reported on 07/26/2019) 21 tablet 0  . SUMAtriptan (IMITREX) 100 MG tablet Take 100 mg by mouth as needed for migraine or headache.     . verapamil (COVERA HS) 240 MG (CO) 24 hr tablet Take 240 mg by mouth 2 (two) times daily.      No current facility-administered medications for this visit.    Family History   Problem Relation Age of Onset  . Ovarian cancer Mother 50  . Heart disease Maternal Grandfather   . Hypertension Paternal Grandmother   . Ovarian cancer Cousin        maternal first cousin dx in her 8s  . Heart disease Father   . Atrial fibrillation Father   . Heart disease Brother   . Heart disease Brother   . Colon cancer Maternal Aunt        dx in her 41s-60  . Diabetes Paternal Aunt   . Ovarian cancer Maternal Grandmother        dx in her mid 39s  . Cancer Maternal Grandmother        OVARIAN AND UTERINE  . Uterine cancer Maternal Grandmother        dx in her early 19s  . Stomach cancer Maternal Uncle        dx in his 54s-80s  . Stroke Maternal Uncle   . Leukemia Paternal Uncle     Review of Systems  All other systems reviewed and are negative.   Exam:   LMP 02/17/1989   Weight change: _0 @ Height:      Ht Readings from Last 3 Encounters:  07/26/19 _1  (1.702 m)  10/06/18 _2  (1.676 m)  09/07/18 _3  (1.702 m)    General appearance: alert, cooperative and appears stated age Head: Normocephalic, without obvious abnormality, atraumatic Neck: no adenopathy, supple, symmetrical, trachea midline and thyroid normal to inspection and palpation Lungs: clear to auscultation bilaterally Cardiovascular: regular rate and rhythm Breasts: normal appearance, no masses or tenderness Abdomen: soft, non-tender; non distended,  no masses,  no organomegaly Extremities: extremities normal, atraumatic, no cyanosis or edema Skin: Skin color, texture, turgor normal. No rashes or lesions Lymph nodes: Cervical, supraclavicular, and axillary nodes normal. No abnormal inguinal nodes palpated Neurologic: Grossly normal   Pelvic: External genitalia:  no lesions              Urethra:  normal appearing urethra with no masses, tenderness or lesions              Bartholins and Skenes: normal                 Vagina: normal appearing vagina with normal color and discharge, no  lesions              Cervix: absent               Bimanual Exam:  Uterus:  uterus absent              Adnexa: no mass, fullness, tenderness               Rectovaginal: Confirms  Anus:  normal sphincter tone, no lesions  Pelvic floor not tender  Gae Dry chaperoned for the exam.  A:  Well Woman with normal exam  Lower back, hip and pelvic pain. C/W MS etiology, pelvic floor not tender. H/O hysterectomy/BSO  P:   No pap   Discussed breast self exam  Discussed calcium and vit D intake  Mammogram UTD  Think colonoscopy is due, she will call to schedule

## 2019-10-27 ENCOUNTER — Other Ambulatory Visit: Payer: Self-pay

## 2019-10-27 ENCOUNTER — Encounter: Payer: Self-pay | Admitting: Obstetrics and Gynecology

## 2019-10-27 ENCOUNTER — Ambulatory Visit (INDEPENDENT_AMBULATORY_CARE_PROVIDER_SITE_OTHER): Payer: PPO | Admitting: Obstetrics and Gynecology

## 2019-10-27 VITALS — BP 130/70 | HR 76 | Temp 98.2°F | Ht 67.0 in | Wt 168.0 lb

## 2019-10-27 DIAGNOSIS — Z01419 Encounter for gynecological examination (general) (routine) without abnormal findings: Secondary | ICD-10-CM | POA: Diagnosis not present

## 2019-10-27 NOTE — Patient Instructions (Signed)
EXERCISE AND DIET:  We recommended that you start or continue a regular exercise program for good health. Regular exercise means any activity that makes your heart beat faster and makes you sweat.  We recommend exercising at least 30 minutes per day at least 3 days a week, preferably 4 or 5.  We also recommend a diet low in fat and sugar.  Inactivity, poor dietary choices and obesity can cause diabetes, heart attack, stroke, and kidney damage, among others.    ALCOHOL AND SMOKING:  Women should limit their alcohol intake to no more than 7 drinks/beers/glasses of wine (combined, not each!) per week. Moderation of alcohol intake to this level decreases your risk of breast cancer and liver damage. And of course, no recreational drugs are part of a healthy lifestyle.  And absolutely no smoking or even second hand smoke. Most people know smoking can cause heart and lung diseases, but did you know it also contributes to weakening of your bones? Aging of your skin?  Yellowing of your teeth and nails?  CALCIUM AND VITAMIN D:  Adequate intake of calcium and Vitamin D are recommended.  The recommendations for exact amounts of these supplements seem to change often, but generally speaking 1,200 mg of calcium (between diet and supplement) and 800 units of Vitamin D per day seems prudent. Certain women may benefit from higher intake of Vitamin D.  If you are among these women, your doctor will have told you during your visit.    PAP SMEARS:  Pap smears, to check for cervical cancer or precancers,  have traditionally been done yearly, although recent scientific advances have shown that most women can have pap smears less often.  However, every woman still should have a physical exam from her gynecologist every year. It will include a breast check, inspection of the vulva and vagina to check for abnormal growths or skin changes, a visual exam of the cervix, and then an exam to evaluate the size and shape of the uterus and  ovaries.  And after 70 years of age, a rectal exam is indicated to check for rectal cancers. We will also provide age appropriate advice regarding health maintenance, like when you should have certain vaccines, screening for sexually transmitted diseases, bone density testing, colonoscopy, mammograms, etc.   MAMMOGRAMS:  All women over 40 years old should have a yearly mammogram. Many facilities now offer a "3D" mammogram, which may cost around $50 extra out of pocket. If possible,  we recommend you accept the option to have the 3D mammogram performed.  It both reduces the number of women who will be called back for extra views which then turn out to be normal, and it is better than the routine mammogram at detecting truly abnormal areas.    COLON CANCER SCREENING: Now recommend starting at age 45. At this time colonoscopy is not covered for routine screening until 50. There are take home tests that can be done between 45-49.   COLONOSCOPY:  Colonoscopy to screen for colon cancer is recommended for all women at age 50.  We know, you hate the idea of the prep.  We agree, BUT, having colon cancer and not knowing it is worse!!  Colon cancer so often starts as a polyp that can be seen and removed at colonscopy, which can quite literally save your life!  And if your first colonoscopy is normal and you have no family history of colon cancer, most women don't have to have it again for   10 years.  Once every ten years, you can do something that may end up saving your life, right?  We will be happy to help you get it scheduled when you are ready.  Be sure to check your insurance coverage so you understand how much it will cost.  It may be covered as a preventative service at no cost, but you should check your particular policy.      Breast Self-Awareness Breast self-awareness means being familiar with how your breasts look and feel. It involves checking your breasts regularly and reporting any changes to your  health care provider. Practicing breast self-awareness is important. A change in your breasts can be a sign of a serious medical problem. Being familiar with how your breasts look and feel allows you to find any problems early, when treatment is more likely to be successful. All women should practice breast self-awareness, including women who have had breast implants. How to do a breast self-exam One way to learn what is normal for your breasts and whether your breasts are changing is to do a breast self-exam. To do a breast self-exam: Look for Changes  1. Remove all the clothing above your waist. 2. Stand in front of a mirror in a room with good lighting. 3. Put your hands on your hips. 4. Push your hands firmly downward. 5. Compare your breasts in the mirror. Look for differences between them (asymmetry), such as: ? Differences in shape. ? Differences in size. ? Puckers, dips, and bumps in one breast and not the other. 6. Look at each breast for changes in your skin, such as: ? Redness. ? Scaly areas. 7. Look for changes in your nipples, such as: ? Discharge. ? Bleeding. ? Dimpling. ? Redness. ? A change in position. Feel for Changes Carefully feel your breasts for lumps and changes. It is best to do this while lying on your back on the floor and again while sitting or standing in the shower or tub with soapy water on your skin. Feel each breast in the following way:  Place the arm on the side of the breast you are examining above your head.  Feel your breast with the other hand.  Start in the nipple area and make  inch (2 cm) overlapping circles to feel your breast. Use the pads of your three middle fingers to do this. Apply light pressure, then medium pressure, then firm pressure. The light pressure will allow you to feel the tissue closest to the skin. The medium pressure will allow you to feel the tissue that is a little deeper. The firm pressure will allow you to feel the tissue  close to the ribs.  Continue the overlapping circles, moving downward over the breast until you feel your ribs below your breast.  Move one finger-width toward the center of the body. Continue to use the  inch (2 cm) overlapping circles to feel your breast as you move slowly up toward your collarbone.  Continue the up and down exam using all three pressures until you reach your armpit.  Write Down What You Find  Write down what is normal for each breast and any changes that you find. Keep a written record with breast changes or normal findings for each breast. By writing this information down, you do not need to depend only on memory for size, tenderness, or location. Write down where you are in your menstrual cycle, if you are still menstruating. If you are having trouble noticing differences   in your breasts, do not get discouraged. With time you will become more familiar with the variations in your breasts and more comfortable with the exam. How often should I examine my breasts? Examine your breasts every month. If you are breastfeeding, the best time to examine your breasts is after a feeding or after using a breast pump. If you menstruate, the best time to examine your breasts is 5-7 days after your period is over. During your period, your breasts are lumpier, and it may be more difficult to notice changes. When should I see my health care provider? See your health care provider if you notice:  A change in shape or size of your breasts or nipples.  A change in the skin of your breast or nipples, such as a reddened or scaly area.  Unusual discharge from your nipples.  A lump or thick area that was not there before.  Pain in your breasts.  Anything that concerns you.  

## 2019-11-10 DIAGNOSIS — R311 Benign essential microscopic hematuria: Secondary | ICD-10-CM | POA: Diagnosis not present

## 2019-11-10 DIAGNOSIS — N3020 Other chronic cystitis without hematuria: Secondary | ICD-10-CM | POA: Diagnosis not present

## 2019-11-16 DIAGNOSIS — H25042 Posterior subcapsular polar age-related cataract, left eye: Secondary | ICD-10-CM | POA: Diagnosis not present

## 2019-11-16 DIAGNOSIS — H43813 Vitreous degeneration, bilateral: Secondary | ICD-10-CM | POA: Diagnosis not present

## 2019-11-16 DIAGNOSIS — H2513 Age-related nuclear cataract, bilateral: Secondary | ICD-10-CM | POA: Diagnosis not present

## 2019-11-16 DIAGNOSIS — H25013 Cortical age-related cataract, bilateral: Secondary | ICD-10-CM | POA: Diagnosis not present

## 2019-11-17 DIAGNOSIS — I359 Nonrheumatic aortic valve disorder, unspecified: Secondary | ICD-10-CM | POA: Insufficient documentation

## 2019-11-17 DIAGNOSIS — Z809 Family history of malignant neoplasm, unspecified: Secondary | ICD-10-CM | POA: Insufficient documentation

## 2019-11-17 DIAGNOSIS — E739 Lactose intolerance, unspecified: Secondary | ICD-10-CM | POA: Insufficient documentation

## 2019-11-17 DIAGNOSIS — E785 Hyperlipidemia, unspecified: Secondary | ICD-10-CM | POA: Insufficient documentation

## 2019-11-17 DIAGNOSIS — G43909 Migraine, unspecified, not intractable, without status migrainosus: Secondary | ICD-10-CM | POA: Insufficient documentation

## 2019-11-20 ENCOUNTER — Ambulatory Visit: Payer: PPO

## 2019-11-22 DIAGNOSIS — E7849 Other hyperlipidemia: Secondary | ICD-10-CM | POA: Diagnosis not present

## 2019-11-22 DIAGNOSIS — Z1331 Encounter for screening for depression: Secondary | ICD-10-CM | POA: Diagnosis not present

## 2019-11-22 DIAGNOSIS — K30 Functional dyspepsia: Secondary | ICD-10-CM | POA: Diagnosis not present

## 2019-11-22 DIAGNOSIS — I10 Essential (primary) hypertension: Secondary | ICD-10-CM | POA: Diagnosis not present

## 2019-11-26 ENCOUNTER — Ambulatory Visit: Payer: PPO | Attending: Internal Medicine

## 2019-11-26 DIAGNOSIS — Z23 Encounter for immunization: Secondary | ICD-10-CM | POA: Insufficient documentation

## 2019-11-26 NOTE — Progress Notes (Signed)
   Covid-19 Vaccination Clinic  Name:  Sharon [name]    MRN: [id number] DOB: [birth year]  11/26/2019  Ms. Voller was observed post Covid-19 immunization for 15 minutes without incidence. She was provided with Vaccine Information Sheet and instruction to access the V-Safe system.   Ms. Stephan was instructed to call 911 with any severe reactions post vaccine: Marland Kitchen Difficulty breathing  . Swelling of your face and throat  . A fast heartbeat  . A bad rash all over your body  . Dizziness and weakness    Immunizations Administered    Name Date Dose VIS Date Route   Pfizer COVID-19 Vaccine 11/26/2019  8:55 AM 0.3 mL 09/30/2019 Intramuscular   Manufacturer: [place]   Lot: [id number]   [place]: [id number]

## 2019-12-01 ENCOUNTER — Ambulatory Visit: Payer: PPO

## 2019-12-20 ENCOUNTER — Ambulatory Visit: Payer: PPO | Attending: Internal Medicine

## 2019-12-20 DIAGNOSIS — Z23 Encounter for immunization: Secondary | ICD-10-CM | POA: Insufficient documentation

## 2019-12-20 NOTE — Progress Notes (Signed)
   Covid-19 Vaccination Clinic  Name:  SKYELA BEVELS    MRN: [id number] DOB: 09/07/1951  12/20/2019  Ms. Carreon was observed post Covid-19 immunization for 15 minutes without incident. She was provided with Vaccine Information Sheet and instruction to access the V-Safe system.   Ms. Gemmill was instructed to call 911 with any severe reactions post vaccine: Marland Kitchen Difficulty breathing  . Swelling of face and throat  . A fast heartbeat  . A bad rash all over body  . Dizziness and weakness   Immunizations Administered    Name Date Dose VIS Date Route   Pfizer COVID-19 Vaccine 12/20/2019  8:39 AM 0.3 mL 09/30/2019 Intramuscular   Manufacturer: [place]   Lot: [id number]   [place]: [id number]

## 2019-12-27 DIAGNOSIS — E7849 Other hyperlipidemia: Secondary | ICD-10-CM | POA: Diagnosis not present

## 2019-12-27 DIAGNOSIS — E559 Vitamin D deficiency, unspecified: Secondary | ICD-10-CM | POA: Diagnosis not present

## 2019-12-30 DIAGNOSIS — R82998 Other abnormal findings in urine: Secondary | ICD-10-CM | POA: Diagnosis not present

## 2020-01-02 DIAGNOSIS — Z1212 Encounter for screening for malignant neoplasm of rectum: Secondary | ICD-10-CM | POA: Diagnosis not present

## 2020-01-11 ENCOUNTER — Other Ambulatory Visit: Payer: Self-pay | Admitting: Internal Medicine

## 2020-01-11 DIAGNOSIS — R110 Nausea: Secondary | ICD-10-CM

## 2020-01-11 DIAGNOSIS — R1013 Epigastric pain: Secondary | ICD-10-CM

## 2020-01-20 ENCOUNTER — Ambulatory Visit
Admission: RE | Admit: 2020-01-20 | Discharge: 2020-01-20 | Disposition: A | Discharge status: Home / Self Care | Payer: PPO | Source: Ambulatory Visit | Attending: Internal Medicine | Admitting: Internal Medicine

## 2020-01-20 DIAGNOSIS — R110 Nausea: Secondary | ICD-10-CM

## 2020-01-20 DIAGNOSIS — K7689 Other specified diseases of liver: Secondary | ICD-10-CM | POA: Diagnosis not present

## 2020-01-20 DIAGNOSIS — R1013 Epigastric pain: Secondary | ICD-10-CM

## 2020-01-27 ENCOUNTER — Encounter: Payer: Self-pay | Admitting: Gastroenterology

## 2020-02-09 DIAGNOSIS — E7849 Other hyperlipidemia: Secondary | ICD-10-CM | POA: Diagnosis not present

## 2020-02-09 DIAGNOSIS — I8393 Asymptomatic varicose veins of bilateral lower extremities: Secondary | ICD-10-CM | POA: Insufficient documentation

## 2020-02-09 DIAGNOSIS — Z0000 Encounter for general adult medical examination without abnormal findings: Secondary | ICD-10-CM | POA: Diagnosis not present

## 2020-02-09 DIAGNOSIS — R1013 Epigastric pain: Secondary | ICD-10-CM | POA: Diagnosis not present

## 2020-02-09 DIAGNOSIS — E559 Vitamin D deficiency, unspecified: Secondary | ICD-10-CM | POA: Diagnosis not present

## 2020-02-09 DIAGNOSIS — I10 Essential (primary) hypertension: Secondary | ICD-10-CM | POA: Diagnosis not present

## 2020-02-09 DIAGNOSIS — G43909 Migraine, unspecified, not intractable, without status migrainosus: Secondary | ICD-10-CM | POA: Diagnosis not present

## 2020-03-02 ENCOUNTER — Ambulatory Visit: Payer: PPO | Admitting: Gastroenterology

## 2020-03-02 ENCOUNTER — Encounter: Payer: Self-pay | Admitting: Gastroenterology

## 2020-03-02 VITALS — BP 110/70 | HR 80 | Temp 98.7°F | Ht 67.0 in | Wt 167.0 lb

## 2020-03-02 DIAGNOSIS — K219 Gastro-esophageal reflux disease without esophagitis: Secondary | ICD-10-CM | POA: Diagnosis not present

## 2020-03-02 DIAGNOSIS — R1013 Epigastric pain: Secondary | ICD-10-CM

## 2020-03-02 MED ORDER — PANTOPRAZOLE SODIUM 40 MG PO TBEC: 40.0000 mg | DELAYED_RELEASE_TABLET | Freq: Every day | ORAL | 3 refills | Status: DC

## 2020-03-02 NOTE — Patient Instructions (Addendum)
If you are age 70 or older, your body mass index should be between 23-30. Your Body mass index is 26.16 kg/m. If this is out of the aforementioned range listed, please consider follow up with your Primary Care Provider.  If you are age 70 or younger, your body mass index should be between 19-25. Your Body mass index is 26.16 kg/m. If this is out of the aformentioned range listed, please consider follow up with your Primary Care Provider.   You have been scheduled for an endoscopy. Please follow written instructions given to you at your visit today. If you use inhalers (even only as needed), please bring them with you on the day of your procedure.  Due to recent changes in healthcare laws, you may see the results of your imaging and laboratory studies on MyChart before your provider has had a chance to review them.  We understand that in some cases there may be results that are confusing or concerning to you. Not all laboratory results come back in the same time frame and the provider may be waiting for multiple results in order to interpret others.  Please give [place] 48 hours in order for your provider to thoroughly review all the results before contacting the office for clarification of your results.   We have sent the following medications to your pharmacy for you to pick up at your convenience:  CONTINUE: pantoprazole 40mg  take 1 tablet daily  You will need a follow up appointment in August 2021.    Lactose-Free Diet, Adult If you have lactose intolerance, you are not able to digest lactose. Lactose is a natural sugar found mainly in dairy milk and dairy products. You may need to avoid all foods and beverages that contain lactose. A lactose-free diet can help you do this. Which foods have lactose? Lactose is found in dairy milk and dairy products, such as:  Yogurt.  Cheese.  Butter.  Margarine.  Sour cream.  Cream.  Whipped toppings and nondairy creamers.  Ice cream and other  dairy-based desserts. Lactose is also found in foods or products made with dairy milk or milk ingredients. To find out whether a food contains dairy milk or a milk ingredient, look at the ingredients list. Avoid foods with the statement "May contain milk" and foods that contain:  Milk powder.  Whey.  Curd.  Caseinate.  Lactose.  Lactalbumin.  Lactoglobulin. What are alternatives to dairy milk and foods made with milk products?  Lactose-free milk.  Soy milk with added calcium and vitamin D.  Almond milk, coconut milk, rice milk, or other nondairy milk alternatives with added calcium and vitamin D. Note that these are low in protein.  Soy products, such as soy yogurt, soy cheese, soy ice cream, and soy-based sour cream.  Other nut milk products, such as almond yogurt, almond cheese, cashew yogurt, cashew cheese, cashew ice cream, coconut yogurt, and coconut ice cream. What are tips for following this plan?  Do not consume foods, beverages, vitamins, minerals, or medicines containing lactose. Read ingredient lists carefully.  Look for the words "lactose-free" on labels.  Use lactase enzyme drops or tablets as directed by your health care provider.  Use lactose-free milk or a milk alternative, such as soy milk or almond milk, for drinking and cooking.  Make sure you get enough calcium and vitamin D in your diet. A lactose-free eating plan can be lacking in these important nutrients.  Take calcium and vitamin D supplements as directed by your health  care provider. Talk to your health care provider about supplements if you are not able to get enough calcium and vitamin D from food. What foods can I eat?  Fruits All fresh, canned, frozen, or dried fruits that are not processed with lactose. Vegetables All fresh, frozen, and canned vegetables without cheese, cream, or butter sauces. Grains Any that are not made with dairy milk or dairy products. Meats and other proteins Any  meat, fish, poultry, and other protein sources that are not made with dairy milk or dairy products. Soy cheese and yogurt. Fats and oils Any that are not made with dairy milk or dairy products. Beverages Lactose-free milk. Soy, rice, or almond milk with added calcium and vitamin D. Fruit and vegetable juices. Sweets and desserts Any that are not made with dairy milk or dairy products. Seasonings and condiments Any that are not made with dairy milk or dairy products. Calcium Calcium is found in many foods that contain lactose and is important for bone health. The amount of calcium you need depends on your age:  Adults younger than 50 years: 1,000 mg of calcium a day.  Adults older than 50 years: 1,200 mg of calcium a day. If you are not getting enough calcium, you may get it from other sources, including:  Orange juice with calcium added. There are 300-350 mg of calcium in 1 cup of orange juice.  Calcium-fortified soy milk. There are 300-400 mg of calcium in 1 cup of calcium-fortified soy milk.  Calcium-fortified rice or almond milk. There are 300 mg of calcium in 1 cup of calcium-fortified rice or almond milk.  Calcium-fortified breakfast cereals. There are 100-1,000 mg of calcium in calcium-fortified breakfast cereals.  Spinach, cooked. There are 145 mg of calcium in  cup of cooked spinach.  Edamame, cooked. There are 130 mg of calcium in  cup of cooked edamame.  Collard greens, cooked. There are 125 mg of calcium in  cup of cooked collard greens.  Kale, frozen or cooked. There are 90 mg of calcium in  cup of cooked or frozen kale.  Almonds. There are 95 mg of calcium in  cup of almonds.  Broccoli, cooked. There are 60 mg of calcium in 1 cup of cooked broccoli. The items listed above may not be a complete list of recommended foods and beverages. Contact a dietitian for more options. What foods are not recommended? Fruits None, unless they are made with dairy milk or dairy  products. Vegetables None, unless they are made with dairy milk or dairy products. Grains Any grains that are made with dairy milk or dairy products. Meats and other proteins None, unless they are made with dairy milk or dairy products. Dairy All dairy products, including milk, goat's milk, buttermilk, kefir, acidophilus milk, flavored milk, evaporated milk, condensed milk, dulce de U[place]arger, eggnog, yogurt, cheese, and cheese spreads. Fats and oils Any that are made with milk or milk products. Margarines and salad dressings that contain milk or cheese. Cream. Half and half. Cream cheese. Sour cream. Chip dips made with sour cream or yogurt. Beverages Hot chocolate. Cocoa with lactose. Instant iced teas. Powdered fruit drinks. Smoothies made with dairy milk or yogurt. Sweets and desserts Any that are made with milk or milk products. Seasonings and condiments Chewing gum that has lactose. Spice blends if they contain lactose. Artificial sweeteners that contain lactose. Nondairy creamers. The items listed above may not be a complete list of foods and beverages to avoid. Contact a dietitian for more  information. Summary  If you are lactose intolerant, it means that you have a hard time digesting lactose, a natural sugar found in milk and milk products.  Following a lactose-free diet can help you manage this condition.  Calcium is important for bone health and is found in many foods that contain lactose. Talk with your health care provider about other sources of calcium. This information is not intended to replace advice given to you by your health care provider. Make sure you discuss any questions you have with your health care provider. Document Revised: 11/03/2017 Document Reviewed: 11/03/2017 Elsevier Patient Education  [employer].   I appreciate the opportunity to care for you. Thank you for choosing me and [facility],  Dr. Harl Bowie

## 2020-03-02 NOTE — Progress Notes (Signed)
Sharon [name]    [id number]    November 09, 1950  Primary Care Physician:Green, Christean Grief, MD  Referring Physician: Michael Boston, [place],   [id number]   Chief complaint:  Epigastric pain  HPI:  69 yr F previously followed by Dr. Olevia Perches, last office visit in March 2016 is here to reestablish care for evaluation of epigastric abdominal pain. She has been having epigastric/upper abdominal pain for past few months.  She was prescribed Protonix for a month in March somewhat better but since she stopped taking it her symptoms returned and she got a refill for the prescription of Protonix again end of April and has been taking it for the past 2 to 3 weeks.  She continues to have epigastric discomfort.   Certain foods make it worse like wine and lime juice.  She also has lactose intolerance, tries to avoid dairy products and also takes Lactaid pills when she eats any milk products.  Denies any dysphagia, odynophagia, vomiting, melena or blood per rectum  She has noticed change in her bowel habits with intermittent constipation and mucus but denies any diarrhea.  Colonoscopy July 21, 2014 by Dr. Deatra Ina: Left-sided diverticulosis and internal hemorrhoids otherwise normal exam  Flexible sigmoidoscopy December 27, 2014 by Dr. Olevia Perches for rectal bleeding showed internal hemorrhoids otherwise normal exam  Colonoscopy November 03, 2008 by Dr. Deatra Ina showed sigmoid diverticulosis otherwise normal exam     Outpatient Encounter Medications as of 03/02/2020  Medication Sig  . atorvastatin (LIPITOR) 10 MG tablet Take 10 mg by mouth daily.  . Calcium Carbonate-Vitamin D (CALCIUM 500 + D PO) Take 2 each by mouth daily. Gummies  . cycloSPORINE (RESTASIS) 0.05 % ophthalmic emulsion Place 1 drop into both eyes 2 (two) times daily.   . fluticasone (FLONASE) 50 MCG/ACT nasal spray Place 1 spray into both nostrils daily as needed for allergies (spring/fall).   . hydrochlorothiazide  (HYDRODIURIL) 25 MG tablet Take 25 mg by mouth daily.  . pantoprazole (PROTONIX) 40 MG tablet Take 40 mg by mouth daily.  . SUMAtriptan (IMITREX) 100 MG tablet Take 100 mg by mouth as needed for migraine or headache.   . verapamil (COVERA HS) 240 MG (CO) 24 hr tablet Take 240 mg by mouth 2 (two) times daily.   . [DISCONTINUED] methocarbamol (ROBAXIN) 500 MG tablet Take 1 tablet (500 mg total) by mouth every 6 (six) hours as needed for muscle spasms.   No facility-administered encounter medications on file as of 03/02/2020.    Allergies as of 03/02/2020 - Review Complete 03/02/2020  Allergen Reaction Noted  . Adhesive [tape] Other (See Comments) 06/23/2012  . Cefuroxime axetil  10/16/2008  . Codeine Other (See Comments) 06/23/2012  . Ceftin Rash 01/22/2012    Past Medical History:  Diagnosis Date  . Family history of adverse reaction to anesthesia    "mom did; don't know what happened" (07/20/2018)  . Family history of colon cancer   . Family history of ovarian cancer   . Family history of stomach cancer   . Family history of uterine cancer   . Fibroid   . Heart murmur    "tiny, tiny one" (07/20/2018)  . High cholesterol   . Hypertension   . Migraine    "a few/year; last one was 07/19/2018" (07/20/2018)  . Osteoarthritis    "knees, hips, hands, probably in my back" (07/20/2018)  . Ringworm   . Vitamin D deficiency  Past Surgical History:  Procedure Laterality Date  . APPENDECTOMY  1969  . BUNIONECTOMY Bilateral   . JOINT REPLACEMENT    . KNEE ARTHROSCOPY Left 11/2013  . Precancerous lesion excised     "arm"  . TONSILLECTOMY    . TOTAL ABDOMINAL HYSTERECTOMY  1990   TAH BSO  . TOTAL HIP ARTHROPLASTY Left 07/20/2018  . TOTAL HIP ARTHROPLASTY Left 07/20/2018   Procedure: LEFT TOTAL HIP ARTHROPLASTY ANTERIOR APPROACH;  Surgeon: Mcarthur Rossetti, MD;  Location: [facility];  Service: Orthopedics;  Laterality: Left;  . TOTAL KNEE ARTHROPLASTY Right 2012  . TOTAL KNEE  ARTHROPLASTY Left 04/03/2015   Procedure: TOTAL KNEE ARTHROPLASTY;  Surgeon: Garald Balding, MD;  Location: [facility];  Service: Orthopedics;  Laterality: Left;    Family History  Problem Relation Age of Onset  . Ovarian cancer Mother 47  . Heart disease Maternal Grandfather   . Hypertension Paternal Grandmother   . Ovarian cancer Cousin        maternal first cousin dx in her 37s  . Heart disease Father   . Atrial fibrillation Father   . Heart disease Brother   . Heart disease Brother   . Colon cancer Maternal Aunt        dx in her 62s-60  . Diabetes Paternal Aunt   . Ovarian cancer Maternal Grandmother        dx in her mid 73s  . Cancer Maternal Grandmother        OVARIAN AND UTERINE  . Uterine cancer Maternal Grandmother        dx in her early 69s  . Stomach cancer Maternal Uncle        dx in his 91s-80s  . Stroke Maternal Uncle   . Leukemia Paternal Uncle     Social History   Socioeconomic History  . Marital status: Married    Spouse name: Not on file  . Number of children: Not on file  . Years of education: Not on file  . Highest education level: Not on file  Occupational History  . Not on file  Tobacco Use  . Smoking status: Never Smoker  . Smokeless tobacco: Never Used  Substance and Sexual Activity  . Alcohol use: Yes    Alcohol/week: 1.0 - 2.0 standard drinks    Types: 1 - 2 Standard drinks or equivalent per week  . Drug use: No  . Sexual activity: Not Currently    Partners: Male    Birth control/protection: Surgical, Post-menopausal  Other Topics Concern  . Not on file  Social History Narrative  . Not on file   Social Determinants of Health   Financial Resource Strain:   . Difficulty of Paying Living Expenses:   Food Insecurity:   . Worried About Charity fundraiser in the Last Year:   . Arboriculturist in the Last Year:   Transportation Needs:   . Film/video editor (Medical):   Marland Kitchen Lack of Transportation (Non-Medical):   Physical Activity:    . Days of Exercise per Week:   . Minutes of Exercise per Session:   Stress:   . Feeling of Stress :   Social Connections:   . Frequency of Communication with Friends and Family:   . Frequency of Social Gatherings with Friends and Family:   . Attends Religious Services:   . Active Member of Clubs or Organizations:   . Attends Archivist Meetings:   Marland Kitchen Marital Status:  Intimate Partner Violence:   . Fear of Current or Ex-Partner:   . Emotionally Abused:   Marland Kitchen Physically Abused:   . Sexually Abused:       Review of systems: All other review of systems negative except as mentioned in the HPI.   Physical Exam: Vitals:   03/02/20 0853  BP: 110/70  Pulse: 80  Temp: 98.7 F (37.1 C)   Body mass index is 26.16 kg/m. Gen:      No acute distress Abd:      Soft, non-tender; no palpable masses, no distension Neuro: alert and oriented x 3 Psych: normal mood and affect  Data Reviewed:  Reviewed labs, radiology imaging, old records and pertinent past GI work up   Assessment and Plan/Recommendations:  69 year old very pleasant female here with complaints of epigastric abdominal pain and recent onset reflux symptoms Persistent heartburn and regurgitation despite PPI  Schedule for EGD for further evaluation, exclude erosive esophagitis, gastritis, malignancy, H. pylori infection and peptic ulcer disease The risks and benefits as well as alternatives of endoscopic procedure(s) have been discussed and reviewed. All questions answered. The patient agrees to proceed.  Continue Protonix 40 mg daily for additional 2 to 3 months Discussed antireflux measures  Constipation: Increase dietary fiber and fluid intake  Abdominal bloating: Trial of lactose-free diet   The patient was provided an opportunity to ask questions and all were answered. The patient agreed with the plan and demonstrated an understanding of the instructions.  Damaris Hippo , MD    CC: Michael Boston, MD

## 2020-03-06 ENCOUNTER — Encounter: Payer: Self-pay | Admitting: Gastroenterology

## 2020-03-13 ENCOUNTER — Encounter: Payer: Self-pay | Admitting: Gastroenterology

## 2020-03-13 ENCOUNTER — Ambulatory Visit (AMBULATORY_SURGERY_CENTER): Payer: PPO | Admitting: Gastroenterology

## 2020-03-13 VITALS — BP 135/80 | HR 45 | Temp 97.5°F | Resp 15 | Ht 67.0 in | Wt 167.0 lb

## 2020-03-13 DIAGNOSIS — R1013 Epigastric pain: Secondary | ICD-10-CM | POA: Diagnosis not present

## 2020-03-13 DIAGNOSIS — I10 Essential (primary) hypertension: Secondary | ICD-10-CM | POA: Diagnosis not present

## 2020-03-13 DIAGNOSIS — K219 Gastro-esophageal reflux disease without esophagitis: Secondary | ICD-10-CM | POA: Diagnosis not present

## 2020-03-13 DIAGNOSIS — K2970 Gastritis, unspecified, without bleeding: Secondary | ICD-10-CM

## 2020-03-13 DIAGNOSIS — K3189 Other diseases of stomach and duodenum: Secondary | ICD-10-CM | POA: Diagnosis not present

## 2020-03-13 MED ORDER — SODIUM CHLORIDE 0.9 % IV SOLN: 500.0000 mL | Freq: Once | INTRAVENOUS | Status: DC

## 2020-03-13 NOTE — Progress Notes (Signed)
VS by DT    

## 2020-03-13 NOTE — Op Note (Signed)
[place] Patient Name: Sharon [name] Procedure Date: 03/13/2020 8:01 AM MRN: [id number] Endoscopist: Mauri Pole , MD Age: 69 Referring MD:  Date of Birth: January 02, 1951 Gender: Female Account #: [id number] Procedure:                Upper GI endoscopy Indications:              Epigastric abdominal pain Medicines:                Monitored Anesthesia Care Procedure:                Pre-Anesthesia Assessment:                           - Prior to the procedure, a History and Physical                            was performed, and patient medications and                            allergies were reviewed. The patient's tolerance of                            previous anesthesia was also reviewed. The risks                            and benefits of the procedure and the sedation                            options and risks were discussed with the patient.                            All questions were answered, and informed consent                            was obtained. Prior Anticoagulants: The patient has                            taken no previous anticoagulant or antiplatelet                            agents. ASA Grade Assessment: II - A patient with                            mild systemic disease. After reviewing the risks                            and benefits, the patient was deemed in                            satisfactory condition to undergo the procedure.                           After obtaining informed consent, the endoscope was  passed under direct vision. Throughout the                            procedure, the patient's blood pressure, pulse, and                            oxygen saturations were monitored continuously. The                            Endoscope was introduced through the mouth, and                            advanced to the second part of duodenum. The upper                            GI endoscopy was  accomplished without difficulty.                            The patient tolerated the procedure well. Scope In: Scope Out: Findings:                 The Z-line was irregular <1cm tongue of salmon                            mucosa, not biopsied as per current guideline and                            recommendation. Z-line was found 35 cm from the                            incisors.                           The gastroesophageal flap valve was visualized                            endoscopically and classified as Hill Grade III                            (minimal fold, loose to endoscope, hiatal hernia                            likely).                           Patchy mildly erythematous mucosa without bleeding                            was found in the entire examined stomach. Biopsies                            were taken with a cold forceps for Helicobacter                            pylori testing.  The cardia and gastric fundus were normal on                            retroflexion.                           The examined duodenum was normal. Complications:            No immediate complications. Estimated Blood Loss:     Estimated blood loss was minimal. Impression:               - Z-line irregular, 35 cm from the incisors.                           - Gastroesophageal flap valve classified as Hill                            Grade III (minimal fold, loose to endoscope, hiatal                            hernia likely).                           - Erythematous mucosa in the stomach. Biopsied.                           - Normal examined duodenum. Recommendation:           - Patient has a contact number available for                            emergencies. The signs and symptoms of potential                            delayed complications were discussed with the                            patient. Return to normal activities tomorrow.                             Written discharge instructions were provided to the                            patient.                           - Resume previous diet.                           - Continue present medications.                           - Await pathology results.                           - Use Protonix (pantoprazole) 40 mg PO daily.                           -  Follow an antireflux regimen. Mauri Pole, MD 03/13/2020 8:20:11 AM This report has been signed electronically.

## 2020-03-13 NOTE — Patient Instructions (Signed)
HANDOUTS PROVIDED ON: GASTRITIS  The biopsies taken today have been sent for pathology.  The results can take 1-3 weeks to receive.    You may resume your previous diet and medication schedule.  Continue taking the 40 mg Protonix daily.  Thank you for allowing [place] to care for you today!!!   YOU HAD AN ENDOSCOPIC PROCEDURE TODAY AT [facility]:   Refer to the procedure report that was given to you for any specific questions about what was found during the examination.  If the procedure report does not answer your questions, please call your gastroenterologist to clarify.  If you requested that your care partner not be given the details of your procedure findings, then the procedure report has been included in a sealed envelope for you to review at your convenience later.  YOU SHOULD EXPECT: Some feelings of bloating in the abdomen. Passage of more gas than usual.  Walking can help get rid of the air that was put into your GI tract during the procedure and reduce the bloating.   Please Note:  You might notice some irritation and congestion in your nose or some drainage.  This is from the oxygen used during your procedure.  There is no need for concern and it should clear up in a day or so.  SYMPTOMS TO REPORT IMMEDIATELY:   Following upper endoscopy (EGD)  Vomiting of blood or coffee ground material  New chest pain or pain under the shoulder blades  Painful or persistently difficult swallowing  New shortness of breath  Fever of 100F or higher  Black, tarry-looking stools  For urgent or emergent issues, a gastroenterologist can be reached at any hour by calling [phone]. Do not use MyChart messaging for urgent concerns.    DIET:  We do recommend a small meal at first, but then you may proceed to your regular diet.  Drink plenty of fluids but you should avoid alcoholic beverages for 24 hours.  ACTIVITY:  You should plan to take it easy for the rest of today and you  should NOT DRIVE or use heavy machinery until tomorrow (because of the sedation medicines used during the test).    FOLLOW UP: Our staff will call the number listed on your records 48-72 hours following your procedure to check on you and address any questions or concerns that you may have regarding the information given to you following your procedure. If we do not reach you, we will leave a message.  We will attempt to reach you two times.  During this call, we will ask if you have developed any symptoms of COVID 19. If you develop any symptoms (ie: fever, flu-like symptoms, shortness of breath, cough etc.) before then, please call [phone].  If you test positive for Covid 19 in the 2 weeks post procedure, please call and report this information to [place].    If any biopsies were taken you will be contacted by phone or by letter within the next 1-3 weeks.  Please call us at [phone] if you have not heard about the biopsies in 3 weeks.    SIGNATURES/CONFIDENTIALITY: You and/or your care partner have signed paperwork which will be entered into your electronic medical record.  These signatures attest to the fact that that the information above on your After Visit Summary has been reviewed and is understood.  Full responsibility of the confidentiality of this discharge information lies with you and/or your care-partner.

## 2020-03-13 NOTE — Progress Notes (Signed)
Report given to PACU, vss 

## 2020-03-15 ENCOUNTER — Telehealth: Payer: Self-pay

## 2020-03-15 NOTE — Telephone Encounter (Signed)
  Follow up Call-  Call back number 03/13/2020  Post procedure Call Back phone  # [phone]  Permission to leave phone message Yes  Some recent data might be hidden     Patient questions:  Do you have a fever, pain , or abdominal swelling? No. Pain Score  0 *  Have you tolerated food without any problems? Yes.    Have you been able to return to your normal activities? Yes.    Do you have any questions about your discharge instructions: Diet   No. Medications  No. Follow up visit  No.  Do you have questions or concerns about your Care? No.  Actions: * If pain score is 4 or above: No action needed, pain <4.  Have you developed a fever since your procedure? No 2.   Have you had an respiratory symptoms (SOB or cough) since your procedure? No  3.   Have you tested positive for COVID 19 since your procedure No 4.   Have you had any family members/close contacts diagnosed with the COVID 19 since your procedure?  No   If yes to any of these questions please route to Joylene John, RN and Erenest Rasher, RN

## 2020-03-22 ENCOUNTER — Encounter: Payer: Self-pay | Admitting: Gastroenterology

## 2020-04-09 ENCOUNTER — Telehealth: Payer: Self-pay

## 2020-04-09 NOTE — Telephone Encounter (Signed)
Patient called stating that she is having some upper back/right shoulder blade pain that is limiting her movement.  Stated that she hasn't taken anything to help with the pain.  Has an appointment scheduled for Wednseday, 04/11/2020.  Cb# [phone].  Please advise. Thank you.

## 2020-04-10 NOTE — Telephone Encounter (Signed)
Called and spoke with patient. She took Advil yesterday and has noticed improvement today. She can take Tramadol, but states that she wants to hold off on pain medicine for now. She will come in as scheduled tomorrow morning to see Dr.Whitfield.

## 2020-04-10 NOTE — Telephone Encounter (Signed)
See below

## 2020-04-10 NOTE — Telephone Encounter (Signed)
Call pt and ask if she can take tramadol until seen-thanks. OK to prescribe

## 2020-04-11 ENCOUNTER — Ambulatory Visit (INDEPENDENT_AMBULATORY_CARE_PROVIDER_SITE_OTHER): Payer: PPO

## 2020-04-11 ENCOUNTER — Encounter: Payer: Self-pay | Admitting: Orthopaedic Surgery

## 2020-04-11 ENCOUNTER — Other Ambulatory Visit: Payer: Self-pay

## 2020-04-11 ENCOUNTER — Ambulatory Visit: Payer: PPO | Admitting: Orthopaedic Surgery

## 2020-04-11 DIAGNOSIS — M542 Cervicalgia: Secondary | ICD-10-CM | POA: Diagnosis not present

## 2020-04-11 MED ORDER — METHOCARBAMOL 500 MG PO TABS
500.0000 mg | ORAL_TABLET | Freq: Two times a day (BID) | ORAL | 1 refills | Status: DC | PRN
Start: 2020-04-11 — End: 2021-04-12

## 2020-04-11 NOTE — Progress Notes (Signed)
Office Visit Note   Patient: Sharon [name]           Date of Birth: Mar 21, 1951           MRN: [id number] Visit Date: 04/11/2020              Requested by: Michael Boston, MD [address] [place],  [place] [address] PCP: Michael Boston, MD   Assessment & Plan: Visit Diagnoses:  1. Neck pain   2. Neck pain, acute     Plan: Pat experience rather acute onset of neck pain about 48 hours ago.  She is actually feeling a little bit better with occasional Advil.  Initially she was having pain in the interscapular region and now it is mostly on the right side of her neck.  Not experiencing any radiculopathy.  X-rays demonstrate significant arthritis in the cervical spine with straightening of the normal lordosis.  We will try Robaxin and continue with heat as needed.  She is feeling much better so should continue with her exercises.  Would like to see her back over the next several weeks if no improvement.  I could always consider Medrol Dosepak or MRI scan.  Follow-Up Instructions: Return if symptoms worsen or fail to improve.   Orders:  Orders Placed This Encounter  Procedures  . XR Cervical Spine 2 or 3 views   Meds ordered this encounter  Medications  . methocarbamol (ROBAXIN) 500 MG tablet    Sig: Take 1 tablet (500 mg total) by mouth 2 (two) times daily as needed for muscle spasms.    Dispense:  30 tablet    Refill:  1      Procedures: No procedures performed   Clinical Data: No additional findings.   Subjective: Chief Complaint  Patient presents with  . Neck - Pain  Pat visits the office today for evaluation of acute onset of neck pain about 2 days ago.  She was experiencing "muscle spasm and a "frozen neck".  Initially she was having pain about the right scapula and now having more trouble on the right side of her neck.  She tried some Advil but that caused her her stomach to be upset.  She notes that she is some better.  On occasion she has had some tingling in the  little finger but present does not have any symptoms referable to the right upper extremity.  No pain referable to her left upper extremity.  She not had any headaches and notes that she has not lost any motion.  She has been performing some gentle strengthening and stretching exercises on a website provided by the [facility]  HPI  Review of Systems   Objective: Vital Signs: LMP 02/17/1989   Physical Exam Constitutional:      Appearance: She is well-developed.  Eyes:     Pupils: Pupils are equal, round, and reactive to light.  Pulmonary:     Effort: Pulmonary effort is normal.  Skin:    General: Skin is warm and dry.  Neurological:     Mental Status: She is alert and oriented to person, place, and time.  Psychiatric:        Behavior: Behavior normal.     Ortho Exam awake alert and oriented x3.  Comfortable sitting.  Actually does not have any significant loss of motion of the cervical spine.  Able to touch her chin to her chest.  Just about full neck extension with a negative Lhermitte's.  Full rotation of  the right and to the left.  Reflexes appear to be symmetrical both upper extremities.  Good grip and release.  Good strength.  Very mild right-sided neck pain without any masses.  No present discomfort in the thoracic spine or in the interscapular region specialty Comments:  No specialty comments available.  Imaging: XR Cervical Spine 2 or 3 views  Result Date: 04/11/2020 Films of the cervical spine obtained in 2 projections.  There is straightening of the normal lordosis.  There is significant degenerative changes in the mid cervical spine particularly at C5-6 and C6-7 where there is narrowing of the disc space and facet sclerosis.  There are anterior osteophytes from C3-C7.  No listhesis or scoliosis.  No other bony abnormalities    PMFS History: Patient Active Problem List   Diagnosis Date Noted  . Neck pain, acute 04/11/2020  . Pain in right ankle and joints of right foot  07/26/2019  . Status post total replacement of left hip 07/20/2018  . Unilateral primary osteoarthritis, left hip 05/25/2018  . Genetic testing 11/14/2016  . Family history of ovarian cancer   . Family history of colon cancer   . Family history of uterine cancer   . Family history of stomach cancer   . Osteoarthritis of left knee 04/03/2015  . S/P total knee replacement using cement 04/03/2015  . Gait abnormality 12/22/2012  . Swelling of ankle 12/22/2012  . Ringworm   . Hypertension   . Fibroid   . Headache(784.0)   . Arthritis   . Vitamin D deficiency    Past Medical History:  Diagnosis Date  . Cataract   . Family history of adverse reaction to anesthesia    "mom did; don't know what happened" (07/20/2018)  . Family history of colon cancer   . Family history of ovarian cancer   . Family history of stomach cancer   . Family history of uterine cancer   . Fibroid   . Heart murmur    "tiny, tiny one" (07/20/2018)  . High cholesterol   . Hypertension   . Migraine    "a few/year; last one was 07/19/2018" (07/20/2018)  . Osteoarthritis    "knees, hips, hands, probably in my back" (07/20/2018)  . Ringworm   . Vitamin D deficiency     Family History  Problem Relation Age of Onset  . Ovarian cancer Mother 55  . Heart disease Maternal Grandfather   . Hypertension Paternal Grandmother   . Ovarian cancer Cousin        maternal first cousin dx in her 95s  . Heart disease Father   . Atrial fibrillation Father   . Heart disease Brother   . Heart disease Brother   . Colon cancer Maternal Aunt        dx in her 71s-60  . Diabetes Paternal Aunt   . Ovarian cancer Maternal Grandmother        dx in her mid 81s  . Cancer Maternal Grandmother        OVARIAN AND UTERINE  . Uterine cancer Maternal Grandmother        dx in her early 85s  . Stomach cancer Maternal Uncle        dx in his 39s-80s  . Stroke Maternal Uncle   . Leukemia Paternal Uncle     Past Surgical History:   Procedure Laterality Date  . APPENDECTOMY  1969  . BUNIONECTOMY Bilateral   . JOINT REPLACEMENT    . KNEE ARTHROSCOPY Left 11/2013  .  Precancerous lesion excised     "arm"  . TONSILLECTOMY    . TOTAL ABDOMINAL HYSTERECTOMY  1990   TAH BSO  . TOTAL HIP ARTHROPLASTY Left 07/20/2018  . TOTAL HIP ARTHROPLASTY Left 07/20/2018   Procedure: LEFT TOTAL HIP ARTHROPLASTY ANTERIOR APPROACH;  Surgeon: Mcarthur Rossetti, MD;  Location: [facility];  Service: Orthopedics;  Laterality: Left;  . TOTAL KNEE ARTHROPLASTY Right 2012  . TOTAL KNEE ARTHROPLASTY Left 04/03/2015   Procedure: TOTAL KNEE ARTHROPLASTY;  Surgeon: Garald Balding, MD;  Location: [facility];  Service: Orthopedics;  Laterality: Left;   Social History   Occupational History  . Not on file  Tobacco Use  . Smoking status: Never Smoker  . Smokeless tobacco: Never Used  Vaping Use  . Vaping Use: Never used  Substance and Sexual Activity  . Alcohol use: Yes    Alcohol/week: 1.0 - 2.0 standard drink    Types: 1 - 2 Standard drinks or equivalent per week  . Drug use: No  . Sexual activity: Not Currently    Partners: Male    Birth control/protection: Surgical, Post-menopausal     Garald Balding, MD   Note - This record has been created using [employer].  Chart creation errors have been sought, but may not always  have been located. Such creation errors do not reflect on  the standard of medical care.

## 2020-04-24 ENCOUNTER — Telehealth: Payer: Self-pay

## 2020-04-24 NOTE — Telephone Encounter (Signed)
Left message on patient's cell phone with appointment scheduled for 06-27-20 at 9:30am with Dr Silverio Decamp.  Advised patient that if appointment date and time does not work for her to please call our office to reschedule.

## 2020-04-24 NOTE — Telephone Encounter (Signed)
-----   Message from Stevan Born, [place] sent at 03/02/2020  9:34 AM EDT ----- Regarding: Aug appt Patient needs to see Dr Silverio Decamp in August, make an appt--epigastric pain, GERD, nhr

## 2020-04-27 ENCOUNTER — Telehealth: Payer: Self-pay | Admitting: Gastroenterology

## 2020-04-27 NOTE — Telephone Encounter (Signed)
Patient is wondering if she should start tapering off the Pantoprazole medication prior to her appt on 05/24/20.

## 2020-04-27 NOTE — Telephone Encounter (Signed)
Continue Protonix as stated on the EGD report from May and discuss whether or not to continue at the  appointement in September.   Discussed with patient

## 2020-05-07 DIAGNOSIS — M545 Low back pain: Secondary | ICD-10-CM | POA: Diagnosis not present

## 2020-05-07 DIAGNOSIS — M25552 Pain in left hip: Secondary | ICD-10-CM | POA: Diagnosis not present

## 2020-05-09 DIAGNOSIS — M545 Low back pain: Secondary | ICD-10-CM | POA: Diagnosis not present

## 2020-05-09 DIAGNOSIS — M25552 Pain in left hip: Secondary | ICD-10-CM | POA: Diagnosis not present

## 2020-05-14 DIAGNOSIS — M545 Low back pain: Secondary | ICD-10-CM | POA: Diagnosis not present

## 2020-05-14 DIAGNOSIS — M25552 Pain in left hip: Secondary | ICD-10-CM | POA: Diagnosis not present

## 2020-05-23 DIAGNOSIS — M25552 Pain in left hip: Secondary | ICD-10-CM | POA: Diagnosis not present

## 2020-05-23 DIAGNOSIS — M545 Low back pain, unspecified: Secondary | ICD-10-CM | POA: Diagnosis not present

## 2020-06-01 DIAGNOSIS — M25552 Pain in left hip: Secondary | ICD-10-CM | POA: Diagnosis not present

## 2020-06-01 DIAGNOSIS — M545 Low back pain: Secondary | ICD-10-CM | POA: Diagnosis not present

## 2020-06-04 DIAGNOSIS — R05 Cough: Secondary | ICD-10-CM | POA: Diagnosis not present

## 2020-06-04 DIAGNOSIS — J3489 Other specified disorders of nose and nasal sinuses: Secondary | ICD-10-CM | POA: Diagnosis not present

## 2020-06-19 DIAGNOSIS — J069 Acute upper respiratory infection, unspecified: Secondary | ICD-10-CM | POA: Diagnosis not present

## 2020-06-19 DIAGNOSIS — Z1152 Encounter for screening for COVID-19: Secondary | ICD-10-CM | POA: Diagnosis not present

## 2020-06-19 DIAGNOSIS — R05 Cough: Secondary | ICD-10-CM | POA: Diagnosis not present

## 2020-06-19 DIAGNOSIS — Z20828 Contact with and (suspected) exposure to other viral communicable diseases: Secondary | ICD-10-CM | POA: Diagnosis not present

## 2020-06-27 ENCOUNTER — Encounter: Payer: Self-pay | Admitting: Gastroenterology

## 2020-06-27 ENCOUNTER — Ambulatory Visit: Payer: PPO | Admitting: Gastroenterology

## 2020-06-27 VITALS — BP 108/60 | HR 72 | Ht 67.0 in | Wt 166.0 lb

## 2020-06-27 DIAGNOSIS — K219 Gastro-esophageal reflux disease without esophagitis: Secondary | ICD-10-CM

## 2020-06-27 MED ORDER — PANTOPRAZOLE SODIUM 40 MG PO TBEC: 40.0000 mg | DELAYED_RELEASE_TABLET | Freq: Every day | ORAL | 11 refills | Status: DC

## 2020-06-27 NOTE — Progress Notes (Signed)
Sharon [name]    [id number]    1951-04-02  Primary Care Physician:Wile, Jesse Sans, MD  Referring Physician: Michael Boston, [place],  [place] [address]   Chief complaint:  Epigastric pain  HPI:  69 year old very pleasant female here for follow-up visit for epigastric abdominal pain  Overall her symptoms have significantly improved, she has changed/modified diet and is also following antireflux measures with significant improvement  Her son is living with her, he is very health-conscious and she is also eating healthy diet.  She is taking PPI once daily with good control of symptoms  Denies any nausea, vomiting, abdominal pain, melena or bright red blood per rectum   EGD 03/13/20: Hiatal hernia, irregular Z-line, gastric biopsies negative for H.pylori   Colonoscopy July 21, 2014 by Dr. Deatra Ina: Left-sided diverticulosis and internal hemorrhoids otherwise normal exam  Flexible sigmoidoscopy December 27, 2014 by Dr. Olevia Perches for rectal bleeding showed internal hemorrhoids otherwise normal exam  Colonoscopy November 03, 2008 by Dr. Deatra Ina showed sigmoid diverticulosis otherwise normal exam   Outpatient Encounter Medications as of 06/27/2020  Medication Sig  . atorvastatin (LIPITOR) 10 MG tablet Take 10 mg by mouth daily.  . Calcium Carbonate-Vitamin D (CALCIUM 500 + D PO) Take 2 each by mouth daily. Gummies  . cycloSPORINE (RESTASIS) 0.05 % ophthalmic emulsion Place 1 drop into both eyes 2 (two) times daily.   . fluticasone (FLONASE) 50 MCG/ACT nasal spray Place 1 spray into both nostrils daily as needed for allergies (spring/fall).   . hydrochlorothiazide (HYDRODIURIL) 25 MG tablet Take 25 mg by mouth daily.  . methocarbamol (ROBAXIN) 500 MG tablet Take 1 tablet (500 mg total) by mouth 2 (two) times daily as needed for muscle spasms.  . pantoprazole (PROTONIX) 40 MG tablet Take 1 tablet (40 mg total) by mouth daily.  . SUMAtriptan (IMITREX) 100 MG  tablet Take 100 mg by mouth as needed for migraine or headache.   . verapamil (COVERA HS) 240 MG (CO) 24 hr tablet Take 240 mg by mouth 2 (two) times daily.    No facility-administered encounter medications on file as of 06/27/2020.    Allergies as of 06/27/2020 - Review Complete 06/27/2020  Allergen Reaction Noted  . Adhesive [tape] Other (See Comments) 06/23/2012  . Cefuroxime axetil  10/16/2008  . Codeine Other (See Comments) 06/23/2012  . Ceftin Rash 01/22/2012    Past Medical History:  Diagnosis Date  . Cataract   . Family history of adverse reaction to anesthesia    "mom did; don't know what happened" (07/20/2018)  . Family history of colon cancer   . Family history of ovarian cancer   . Family history of stomach cancer   . Family history of uterine cancer   . Fibroid   . Heart murmur    "tiny, tiny one" (07/20/2018)  . High cholesterol   . Hypertension   . Migraine    "a few/year; last one was 07/19/2018" (07/20/2018)  . Osteoarthritis    "knees, hips, hands, probably in my back" (07/20/2018)  . Ringworm   . Vitamin D deficiency     Past Surgical History:  Procedure Laterality Date  . APPENDECTOMY  1969  . BUNIONECTOMY Bilateral   . JOINT REPLACEMENT    . KNEE ARTHROSCOPY Left 11/2013  . Precancerous lesion excised     "arm"  . TONSILLECTOMY    . TOTAL ABDOMINAL HYSTERECTOMY  1990   TAH BSO  .  TOTAL HIP ARTHROPLASTY Left 07/20/2018  . TOTAL HIP ARTHROPLASTY Left 07/20/2018   Procedure: LEFT TOTAL HIP ARTHROPLASTY ANTERIOR APPROACH;  Surgeon: Mcarthur Rossetti, MD;  Location: [facility];  Service: Orthopedics;  Laterality: Left;  . TOTAL KNEE ARTHROPLASTY Right 2012  . TOTAL KNEE ARTHROPLASTY Left 04/03/2015   Procedure: TOTAL KNEE ARTHROPLASTY;  Surgeon: Garald Balding, MD;  Location: [facility];  Service: Orthopedics;  Laterality: Left;    Family History  Problem Relation Age of Onset  . Ovarian cancer Mother 43  . Heart disease Maternal Grandfather   .  Hypertension Paternal Grandmother   . Ovarian cancer Cousin        maternal first cousin dx in her 43s  . Heart disease Father   . Atrial fibrillation Father   . Heart disease Brother   . Heart disease Brother   . Colon cancer Maternal Aunt        dx in her 32s-60  . Diabetes Paternal Aunt   . Ovarian cancer Maternal Grandmother        dx in her mid 16s  . Cancer Maternal Grandmother        OVARIAN AND UTERINE  . Uterine cancer Maternal Grandmother        dx in her early 90s  . Stomach cancer Maternal Uncle        dx in his 26s-80s  . Stroke Maternal Uncle   . Leukemia Paternal Uncle     Social History   Socioeconomic History  . Marital status: Married    Spouse name: Not on file  . Number of children: Not on file  . Years of education: Not on file  . Highest education level: Not on file  Occupational History  . Not on file  Tobacco Use  . Smoking status: Never Smoker  . Smokeless tobacco: Never Used  Vaping Use  . Vaping Use: Never used  Substance and Sexual Activity  . Alcohol use: Yes    Alcohol/week: 1.0 - 2.0 standard drink    Types: 1 - 2 Standard drinks or equivalent per week  . Drug use: No  . Sexual activity: Not Currently    Partners: Male    Birth control/protection: Surgical, Post-menopausal  Other Topics Concern  . Not on file  Social History Narrative  . Not on file   Social Determinants of Health   Financial Resource Strain:   . Difficulty of Paying Living Expenses: Not on file  Food Insecurity:   . Worried About Charity fundraiser in the Last Year: Not on file  . Ran Out of Food in the Last Year: Not on file  Transportation Needs:   . Lack of Transportation (Medical): Not on file  . Lack of Transportation (Non-Medical): Not on file  Physical Activity:   . Days of Exercise per Week: Not on file  . Minutes of Exercise per Session: Not on file  Stress:   . Feeling of Stress : Not on file  Social Connections:   . Frequency of  Communication with Friends and Family: Not on file  . Frequency of Social Gatherings with Friends and Family: Not on file  . Attends Religious Services: Not on file  . Active Member of Clubs or Organizations: Not on file  . Attends Archivist Meetings: Not on file  . Marital Status: Not on file  Intimate Partner Violence:   . Fear of Current or Ex-Partner: Not on file  . Emotionally Abused: Not on  file  . Physically Abused: Not on file  . Sexually Abused: Not on file      Review of systems: All other review of systems negative except as mentioned in the HPI.   Physical Exam: Vitals:   06/27/20 0930  BP: 108/60  Pulse: 72  SpO2: 97%   Body mass index is 26 kg/m. Gen:      No acute distress Neuro: alert and oriented x 3 Psych: normal mood and affect  Data Reviewed:  Reviewed labs, radiology imaging, old records and pertinent past GI work up   Assessment and Plan/Recommendations:  69 year old very pleasant female here for follow-up visit for GERD, mild gastritis  Overall symptoms well controlled  GERD: Continue Protonix 40 mg daily.  Advised patient to take it as needed going forward if her symptoms are well controlled Okay to use Pepcid 20 mg at bedtime for nocturnal symptoms Gaviscon after meals as needed if has postprandial GERD symptoms Continue antireflux measures  Chronic idiopathic constipation: Continue high-fiber diet and increase fluid intake  Bloating improved by limiting lactose intake, continue to monitor  Return in 1 year or sooner if needed   The patient was provided an opportunity to ask questions and all were answered. The patient agreed with the plan and demonstrated an understanding of the instructions.  Damaris Hippo , MD    CC: Michael Boston, MD

## 2020-06-27 NOTE — Patient Instructions (Signed)
We refilled your Protonix today  Use Gaviscon after meals as needed  Take Pepcid 20 mg at bedtime as needed   Gastroesophageal Reflux Disease, Adult Gastroesophageal reflux (GER) happens when acid from the stomach flows up into the tube that connects the mouth and the stomach (esophagus). Normally, food travels down the esophagus and stays in the stomach to be digested. However, when a person has GER, food and stomach acid sometimes move back up into the esophagus. If this becomes a more serious problem, the person may be diagnosed with a disease called gastroesophageal reflux disease (GERD). GERD occurs when the reflux:  Happens often.  Causes frequent or severe symptoms.  Causes problems such as damage to the esophagus. When stomach acid comes in contact with the esophagus, the acid may cause soreness (inflammation) in the esophagus. Over time, GERD may create small holes (ulcers) in the lining of the esophagus. What are the causes? This condition is caused by a problem with the muscle between the esophagus and the stomach (lower esophageal sphincter, or LES). Normally, the LES muscle closes after food passes through the esophagus to the stomach. When the LES is weakened or abnormal, it does not close properly, and that allows food and stomach acid to go back up into the esophagus. The LES can be weakened by certain dietary substances, medicines, and medical conditions, including:  Tobacco use.  Pregnancy.  Having a hiatal hernia.  Alcohol use.  Certain foods and beverages, such as coffee, chocolate, onions, and peppermint. What increases the risk? You are more likely to develop this condition if you:  Have an increased body weight.  Have a connective tissue disorder.  Use NSAID medicines. What are the signs or symptoms? Symptoms of this condition include:  Heartburn.  Difficult or painful swallowing.  The feeling of having a lump in the throat.  Abitter taste in the  mouth.  Bad breath.  Having a large amount of saliva.  Having an upset or bloated stomach.  Belching.  Chest pain. Different conditions can cause chest pain. Make sure you see your health care provider if you experience chest pain.  Shortness of breath or wheezing.  Ongoing (chronic) cough or a night-time cough.  Wearing away of tooth enamel.  Weight loss. How is this diagnosed? Your health care provider will take a medical history and perform a physical exam. To determine if you have mild or severe GERD, your health care provider may also monitor how you respond to treatment. You may also have tests, including:  A test to examine your stomach and esophagus with a small camera (endoscopy).  A test thatmeasures the acidity level in your esophagus.  A test thatmeasures how much pressure is on your esophagus.  A barium swallow or modified barium swallow test to show the shape, size, and functioning of your esophagus. How is this treated? The goal of treatment is to help relieve your symptoms and to prevent complications. Treatment for this condition may vary depending on how severe your symptoms are. Your health care provider may recommend:  Changes to your diet.  Medicine.  Surgery. Follow these instructions at home: Eating and drinking   Follow a diet as recommended by your health care provider. This may involve avoiding foods and drinks such as: ? Coffee and tea (with or without caffeine). ? Drinks that containalcohol. ? Energy drinks and sports drinks. ? Carbonated drinks or sodas. ? Chocolate and cocoa. ? Peppermint and mint flavorings. ? Garlic and onions. ? Horseradish. ?  Spicy and acidic foods, including peppers, chili powder, curry powder, vinegar, hot sauces, and barbecue sauce. ? Citrus fruit juices and citrus fruits, such as oranges, lemons, and limes. ? Tomato-based foods, such as red sauce, chili, salsa, and pizza with red sauce. ? Fried and fatty  foods, such as donuts, french fries, potato chips, and high-fat dressings. ? High-fat meats, such as hot dogs and fatty cuts of red and white meats, such as rib eye steak, sausage, ham, and bacon. ? High-fat dairy items, such as whole milk, butter, and cream cheese.  Eat small, frequent meals instead of large meals.  Avoid drinking large amounts of liquid with your meals.  Avoid eating meals during the 2-3 hours before bedtime.  Avoid lying down right after you eat.  Do not exercise right after you eat. Lifestyle   Do not use any products that contain nicotine or tobacco, such as cigarettes, e-cigarettes, and chewing tobacco. If you need help quitting, ask your health care provider.  Try to reduce your stress by using methods such as yoga or meditation. If you need help reducing stress, ask your health care provider.  If you are overweight, reduce your weight to an amount that is healthy for you. Ask your health care provider for guidance about a safe weight loss goal. General instructions  Pay attention to any changes in your symptoms.  Take over-the-counter and prescription medicines only as told by your health care provider. Do not take aspirin, ibuprofen, or other NSAIDs unless your health care provider told you to do so.  Wear loose-fitting clothing. Do not wear anything tight around your waist that causes pressure on your abdomen.  Raise (elevate) the head of your bed about 6 inches (15 cm).  Avoid bending over if this makes your symptoms worse.  Keep all follow-up visits as told by your health care provider. This is important. Contact a health care provider if:  You have: ? New symptoms. ? Unexplained weight loss. ? Difficulty swallowing or it hurts to swallow. ? Wheezing or a persistent cough. ? A hoarse voice.  Your symptoms do not improve with treatment. Get help right away if you:  Have pain in your arms, neck, jaw, teeth, or back.  Feel sweaty, dizzy, or  light-headed.  Have chest pain or shortness of breath.  Vomit and your vomit looks like blood or coffee grounds.  Faint.  Have stool that is bloody or black.  Cannot swallow, drink, or eat. Summary  Gastroesophageal reflux happens when acid from the stomach flows up into the esophagus. GERD is a disease in which the reflux happens often, causes frequent or severe symptoms, or causes problems such as damage to the esophagus.  Treatment for this condition may vary depending on how severe your symptoms are. Your health care provider may recommend diet and lifestyle changes, medicine, or surgery.  Contact a health care provider if you have new or worsening symptoms.  Take over-the-counter and prescription medicines only as told by your health care provider. Do not take aspirin, ibuprofen, or other NSAIDs unless your health care provider told you to do so.  Keep all follow-up visits as told by your health care provider. This is important. This information is not intended to replace advice given to you by your health care provider. Make sure you discuss any questions you have with your health care provider. Document Revised: 04/14/2018 Document Reviewed: 04/14/2018 Elsevier Patient Education  2020 [place] for Gastroesophageal Reflux  Disease, Adult When you have gastroesophageal reflux disease (GERD), the foods you eat and your eating habits are very important. Choosing the right foods can help ease your discomfort. Think about working with a nutrition specialist (dietitian) to help you make good choices. What are tips for following this plan?  Meals  Choose healthy foods that are low in fat, such as fruits, vegetables, whole grains, low-fat dairy products, and lean meat, fish, and poultry.  Eat small meals often instead of 3 large meals a day. Eat your meals slowly, and in a place where you are relaxed. Avoid bending over or lying down until 2-3 hours after  eating.  Avoid eating meals 2-3 hours before bed.  Avoid drinking a lot of liquid with meals.  Cook foods using methods other than frying. Bake, grill, or broil food instead.  Avoid or limit: ? Chocolate. ? Peppermint or spearmint. ? Alcohol. ? Pepper. ? Black and decaffeinated coffee. ? Black and decaffeinated tea. ? Bubbly (carbonated) soft drinks. ? Caffeinated energy drinks and soft drinks.  Limit high-fat foods such as: ? Fatty meat or fried foods. ? Whole milk, cream, butter, or ice cream. ? Nuts and nut butters. ? Pastries, donuts, and sweets made with butter or shortening.  Avoid foods that cause symptoms. These foods may be different for everyone. Common foods that cause symptoms include: ? Tomatoes. ? Oranges, lemons, and limes. ? Peppers. ? Spicy food. ? Onions and garlic. ? Vinegar. Lifestyle  Maintain a healthy weight. Ask your doctor what weight is healthy for you. If you need to lose weight, work with your doctor to do so safely.  Exercise for at least 30 minutes for 5 or more days each week, or as told by your doctor.  Wear loose-fitting clothes.  Do not smoke. If you need help quitting, ask your doctor.  Sleep with the head of your bed higher than your feet. Use a wedge under the mattress or blocks under the bed frame to raise the head of the bed. Summary  When you have gastroesophageal reflux disease (GERD), food and lifestyle choices are very important in easing your symptoms.  Eat small meals often instead of 3 large meals a day. Eat your meals slowly, and in a place where you are relaxed.  Limit high-fat foods such as fatty meat or fried foods.  Avoid bending over or lying down until 2-3 hours after eating.  Avoid peppermint and spearmint, caffeine, alcohol, and chocolate. This information is not intended to replace advice given to you by your health care provider. Make sure you discuss any questions you have with your health care  provider. Document Revised: 01/27/2019 Document Reviewed: 11/11/2016 Elsevier Patient Education  [place].  I appreciate the  opportunity to care for you  Thank You   Harl Bowie , MD

## 2020-06-29 ENCOUNTER — Encounter: Payer: Self-pay | Admitting: Gastroenterology

## 2020-07-31 DIAGNOSIS — L609 Nail disorder, unspecified: Secondary | ICD-10-CM | POA: Diagnosis not present

## 2020-07-31 DIAGNOSIS — B351 Tinea unguium: Secondary | ICD-10-CM | POA: Diagnosis not present

## 2020-08-08 DIAGNOSIS — I10 Essential (primary) hypertension: Secondary | ICD-10-CM | POA: Diagnosis not present

## 2020-08-08 DIAGNOSIS — Z23 Encounter for immunization: Secondary | ICD-10-CM | POA: Diagnosis not present

## 2020-08-08 DIAGNOSIS — E663 Overweight: Secondary | ICD-10-CM | POA: Diagnosis not present

## 2020-08-08 DIAGNOSIS — E785 Hyperlipidemia, unspecified: Secondary | ICD-10-CM | POA: Diagnosis not present

## 2020-08-31 DIAGNOSIS — M545 Low back pain, unspecified: Secondary | ICD-10-CM | POA: Diagnosis not present

## 2020-08-31 DIAGNOSIS — M25552 Pain in left hip: Secondary | ICD-10-CM | POA: Diagnosis not present

## 2020-09-03 DIAGNOSIS — M545 Low back pain, unspecified: Secondary | ICD-10-CM | POA: Diagnosis not present

## 2020-09-03 DIAGNOSIS — M25552 Pain in left hip: Secondary | ICD-10-CM | POA: Diagnosis not present

## 2020-09-10 DIAGNOSIS — Z20822 Contact with and (suspected) exposure to covid-19: Secondary | ICD-10-CM | POA: Diagnosis not present

## 2020-09-10 DIAGNOSIS — J029 Acute pharyngitis, unspecified: Secondary | ICD-10-CM | POA: Diagnosis not present

## 2020-09-18 DIAGNOSIS — M25552 Pain in left hip: Secondary | ICD-10-CM | POA: Diagnosis not present

## 2020-09-18 DIAGNOSIS — M545 Low back pain, unspecified: Secondary | ICD-10-CM | POA: Diagnosis not present

## 2020-09-27 DIAGNOSIS — M545 Low back pain, unspecified: Secondary | ICD-10-CM | POA: Diagnosis not present

## 2020-09-27 DIAGNOSIS — M25552 Pain in left hip: Secondary | ICD-10-CM | POA: Diagnosis not present

## 2020-10-01 ENCOUNTER — Encounter: Payer: Self-pay | Admitting: Obstetrics and Gynecology

## 2020-10-01 DIAGNOSIS — N644 Mastodynia: Secondary | ICD-10-CM | POA: Diagnosis not present

## 2020-10-08 DIAGNOSIS — L738 Other specified follicular disorders: Secondary | ICD-10-CM | POA: Diagnosis not present

## 2020-10-08 DIAGNOSIS — D2371 Other benign neoplasm of skin of right lower limb, including hip: Secondary | ICD-10-CM | POA: Diagnosis not present

## 2020-10-08 DIAGNOSIS — L603 Nail dystrophy: Secondary | ICD-10-CM | POA: Diagnosis not present

## 2020-10-08 DIAGNOSIS — D1801 Hemangioma of skin and subcutaneous tissue: Secondary | ICD-10-CM | POA: Diagnosis not present

## 2020-10-08 DIAGNOSIS — L821 Other seborrheic keratosis: Secondary | ICD-10-CM | POA: Diagnosis not present

## 2020-10-08 DIAGNOSIS — L814 Other melanin hyperpigmentation: Secondary | ICD-10-CM | POA: Diagnosis not present

## 2020-10-29 DIAGNOSIS — R311 Benign essential microscopic hematuria: Secondary | ICD-10-CM | POA: Diagnosis not present

## 2020-11-01 ENCOUNTER — Encounter: Payer: Self-pay | Admitting: Obstetrics and Gynecology

## 2020-11-01 ENCOUNTER — Ambulatory Visit: Payer: PPO | Admitting: Obstetrics and Gynecology

## 2020-11-01 ENCOUNTER — Other Ambulatory Visit: Payer: Self-pay

## 2020-11-01 VITALS — BP 120/68 | HR 72 | Resp 16 | Ht 65.0 in | Wt 167.0 lb

## 2020-11-01 DIAGNOSIS — Z01419 Encounter for gynecological examination (general) (routine) without abnormal findings: Secondary | ICD-10-CM | POA: Diagnosis not present

## 2020-11-01 NOTE — Patient Instructions (Signed)

## 2020-11-01 NOTE — Progress Notes (Signed)
70 y.o. G14P2002 Married White or Caucasian Not Hispanic or Latino female here for annual exam.  H/O TAH/BSO  She has a FH of ovarian and colon cancer. She has had negative BRCA testing and negative testing for Lynch. S/P TAH/BSO.    Patient's last menstrual period was 02/17/1989.          Sexually active: No.  The current method of family planning is post menopausal status.    Exercising: Yes.    water jogging, walking, workouts Smoker:  no  Health Maintenance: Pap:  2013 Negative History of abnormal Pap:  no MMG:  2021 done with [facility] BMD:   09/22/16 Normal Colonoscopy: 07/21/14 Mild diverticulosis TDaP:  UTD with PCP Gardasil: n/a   reports that she has never smoked. She has never used smokeless tobacco. She reports current alcohol use of about 1.0 - 2.0 standard drink of alcohol per week. She reports that she does not use drugs. Retired Pharmacist, hospital. She has 2 sons, not local, no grandchildren.  Past Medical History:  Diagnosis Date  . Cataract   . Family history of adverse reaction to anesthesia    "mom did; don't know what happened" (07/20/2018)  . Family history of colon cancer   . Family history of ovarian cancer   . Family history of stomach cancer   . Family history of uterine cancer   . Fibroid   . Heart murmur    "tiny, tiny one" (07/20/2018)  . High cholesterol   . Hypertension   . Migraine    "a few/year; last one was 07/19/2018" (07/20/2018)  . Osteoarthritis    "knees, hips, hands, probably in my back" (07/20/2018)  . Ringworm   . Vitamin D deficiency     Past Surgical History:  Procedure Laterality Date  . APPENDECTOMY  1969  . BUNIONECTOMY Bilateral   . JOINT REPLACEMENT    . KNEE ARTHROSCOPY Left 11/2013  . Precancerous lesion excised     "arm"  . TONSILLECTOMY    . TOTAL ABDOMINAL HYSTERECTOMY  1990   TAH BSO  . TOTAL HIP ARTHROPLASTY Left 07/20/2018  . TOTAL HIP ARTHROPLASTY Left 07/20/2018   Procedure: LEFT TOTAL HIP ARTHROPLASTY ANTERIOR APPROACH;   Surgeon: Mcarthur Rossetti, MD;  Location: [facility];  Service: Orthopedics;  Laterality: Left;  . TOTAL KNEE ARTHROPLASTY Right 2012  . TOTAL KNEE ARTHROPLASTY Left 04/03/2015   Procedure: TOTAL KNEE ARTHROPLASTY;  Surgeon: Garald Balding, MD;  Location: [facility];  Service: Orthopedics;  Laterality: Left;    Current Outpatient Medications  Medication Sig Dispense Refill  . atorvastatin (LIPITOR) 10 MG tablet Take 10 mg by mouth daily.    . Calcium Carbonate-Vitamin D (CALCIUM 500 + D PO) Take 2 each by mouth daily. Gummies    . Cholecalciferol (VITAMIN D3) 25 MCG (1000 UT) CAPS Take by mouth.    . cycloSPORINE (RESTASIS) 0.05 % ophthalmic emulsion Place 1 drop into both eyes 2 (two) times daily.     . hydrochlorothiazide (HYDRODIURIL) 25 MG tablet Take 25 mg by mouth daily.    . methocarbamol (ROBAXIN) 500 MG tablet Take 1 tablet (500 mg total) by mouth 2 (two) times daily as needed for muscle spasms. 30 tablet 1  . SUMAtriptan (IMITREX) 100 MG tablet Take 100 mg by mouth as needed for migraine or headache.     . verapamil (COVERA HS) 240 MG (CO) 24 hr tablet Take 240 mg by mouth 2 (two) times daily.     . fluticasone (FLONASE)  50 MCG/ACT nasal spray Place 1 spray into both nostrils daily as needed for allergies (spring/fall).  (Patient not taking: Reported on 11/01/2020)    . pantoprazole (PROTONIX) 40 MG tablet Take 1 tablet (40 mg total) by mouth daily. As needed (Patient not taking: Reported on 11/01/2020) 30 tablet 11   No current facility-administered medications for this visit.    Family History  Problem Relation Age of Onset  . Ovarian cancer Mother 37  . Heart disease Maternal Grandfather   . Hypertension Paternal Grandmother   . Ovarian cancer Cousin        maternal first cousin dx in her 25s  . Heart disease Father   . Atrial fibrillation Father   . Heart disease Brother   . Heart disease Brother   . Colon cancer Maternal Aunt        dx in her 48s-60  . Diabetes Paternal  Aunt   . Ovarian cancer Maternal Grandmother        dx in her mid 62s  . Cancer Maternal Grandmother        OVARIAN AND UTERINE  . Uterine cancer Maternal Grandmother        dx in her early 32s  . Stomach cancer Maternal Uncle        dx in his 49s-80s  . Stroke Maternal Uncle   . Leukemia Paternal Uncle     Review of Systems  Constitutional: Negative.   HENT: Negative.   Eyes: Negative.   Respiratory: Negative.   Cardiovascular: Negative.   Gastrointestinal: Negative.   Endocrine: Negative.   Genitourinary: Negative.   Musculoskeletal: Negative.   Skin: Negative.   Allergic/Immunologic: Negative.   Neurological: Negative.   Hematological: Negative.   Psychiatric/Behavioral: Negative.     Exam:   BP 120/68 (BP Location: Right Arm, Patient Position: Sitting, Cuff Size: Normal)   Pulse 72   Resp 16   Ht _0  (1.651 m)   Wt 167 lb (75.8 kg)   LMP 02/17/1989   BMI 27.79 kg/m   Weight change: _1 @ Height:   Height: _2  (165.1 cm)  Ht Readings from Last 3 Encounters:  11/01/20 _3  (1.651 m)  06/27/20 _4  (1.702 m)  03/13/20 _5  (1.702 m)    General appearance: alert, cooperative and appears stated age Head: Normocephalic, without obvious abnormality, atraumatic Neck: no adenopathy, supple, symmetrical, trachea midline and thyroid normal to inspection and palpation Breasts: normal appearance, no masses or tenderness Abdomen: soft, non-tender; non distended,  no masses,  no organomegaly Extremities: extremities normal, atraumatic, no cyanosis or edema Skin: Skin color, texture, turgor normal. No rashes or lesions Lymph nodes: Cervical, supraclavicular, and axillary nodes normal. No abnormal inguinal nodes palpated Neurologic: Grossly normal   Pelvic: External genitalia:  no lesions              Urethra:  normal appearing urethra with no masses, tenderness or lesions              Bartholins and Skenes: normal                 Vagina: normal  appearing vagina with normal color and discharge, no lesions              Cervix: absent               Bimanual Exam:  Uterus:  uterus absent              Adnexa: no mass, fullness,  tenderness               Rectovaginal: Confirms               Anus:  normal sphincter tone, no lesions  Terence Lux chaperoned for the exam.  A:  Well Woman with normal exam  P:   No pap needed  Mammogram, colonoscopy, labs and DEXA are UTD  Discussed breast self exam  Discussed calcium and vit D intake

## 2020-11-13 DIAGNOSIS — H25043 Posterior subcapsular polar age-related cataract, bilateral: Secondary | ICD-10-CM | POA: Diagnosis not present

## 2020-11-13 DIAGNOSIS — H2511 Age-related nuclear cataract, right eye: Secondary | ICD-10-CM | POA: Diagnosis not present

## 2020-11-13 DIAGNOSIS — H18413 Arcus senilis, bilateral: Secondary | ICD-10-CM | POA: Diagnosis not present

## 2020-11-13 DIAGNOSIS — H25013 Cortical age-related cataract, bilateral: Secondary | ICD-10-CM | POA: Diagnosis not present

## 2020-11-13 DIAGNOSIS — H2513 Age-related nuclear cataract, bilateral: Secondary | ICD-10-CM | POA: Diagnosis not present

## 2020-11-13 DIAGNOSIS — H04123 Dry eye syndrome of bilateral lacrimal glands: Secondary | ICD-10-CM | POA: Diagnosis not present

## 2020-11-14 ENCOUNTER — Ambulatory Visit (INDEPENDENT_AMBULATORY_CARE_PROVIDER_SITE_OTHER): Payer: PPO

## 2020-11-14 ENCOUNTER — Ambulatory Visit: Payer: PPO | Admitting: Orthopaedic Surgery

## 2020-11-14 DIAGNOSIS — Z96642 Presence of left artificial hip joint: Secondary | ICD-10-CM | POA: Diagnosis not present

## 2020-11-14 NOTE — Progress Notes (Signed)
Patient is a very pleasant 70 year old female well-known to me.  I replaced her left hip in October 2019.  She is very active and does get into the pool quite a bit with water aerobics.  She would like to do more water yoga as well and other yoga.  She has been having some left hip pain but she points to the left SI joint area and the lower back as a source of her pain.  She did have an MRI of her lumbar spine in January 2019 that just showed some mild degenerative changes.  She walks without a limp.  She has orthotics in both her shoes but has been told by physical therapy that her left leg was shorter than the right.  I did have her lay down in a supine position.  Her leg lengths are not uneven at all looking at her pelvis knees ankles and feet.  He when she stands she does not have a leg length discrepancy.  An AP pelvis and lateral of her left hip shows no leg length discrepancy at all with her standing.  I did make measurements of offset and leg length and she is not off.  There is some slight degenerative changes of the lower lumbar spine and the SI joint to the left side.  On exam her pain seems to be in the upper pelvis area to the left side in the lower back almost facet joint related.  There is negative straight leg raise on the left side.  She has excellent strength in her bilateral lower extremities and full range of motion of her left hip with no discomfort.  I would like to send her to Dr. Ernestina Patches to consider a left-sided SI joint injection versus a left-sided L5-S1 facet joint injection.  I also recommended she go to the [place] even [employer] or often running to have her shoewear assessed and to look at better fitting orthotics for her.  She agrees with this treatment plan.  When she has intervention with Dr. Ernestina Patches they can get her back to me several weeks later.  All questions and concerns were answered and addressed.

## 2020-11-15 ENCOUNTER — Other Ambulatory Visit: Payer: Self-pay

## 2020-11-15 DIAGNOSIS — G8929 Other chronic pain: Secondary | ICD-10-CM

## 2020-11-15 DIAGNOSIS — M545 Low back pain, unspecified: Secondary | ICD-10-CM

## 2020-11-15 DIAGNOSIS — M533 Sacrococcygeal disorders, not elsewhere classified: Secondary | ICD-10-CM

## 2020-11-16 ENCOUNTER — Telehealth: Payer: Self-pay | Admitting: Physical Medicine and Rehabilitation

## 2020-11-16 NOTE — Telephone Encounter (Signed)
Pt called and needs shena to give her a call back. She is returning her call.

## 2020-11-19 NOTE — Telephone Encounter (Signed)
Called pt and sch 2/15

## 2020-12-04 ENCOUNTER — Ambulatory Visit: Payer: PPO | Admitting: Physical Medicine and Rehabilitation

## 2020-12-04 ENCOUNTER — Other Ambulatory Visit: Payer: Self-pay

## 2020-12-04 ENCOUNTER — Encounter: Payer: Self-pay | Admitting: Physical Medicine and Rehabilitation

## 2020-12-04 VITALS — BP 119/76 | HR 66

## 2020-12-04 DIAGNOSIS — M5116 Intervertebral disc disorders with radiculopathy, lumbar region: Secondary | ICD-10-CM | POA: Diagnosis not present

## 2020-12-04 DIAGNOSIS — M5416 Radiculopathy, lumbar region: Secondary | ICD-10-CM | POA: Diagnosis not present

## 2020-12-04 DIAGNOSIS — M25552 Pain in left hip: Secondary | ICD-10-CM | POA: Diagnosis not present

## 2020-12-04 NOTE — Progress Notes (Signed)
Left buttock pain, some pain in the front. Pain sometimes radiates down leg and sometimes has knee pain.  Numeric Pain Rating Scale and Functional Assessment Average Pain 3   In the last MONTH (on 0-10 scale) has pain interfered with the following?  1. General activity like being  able to carry out your everyday physical activities such as walking, climbing stairs, carrying groceries, or moving a chair?  Rating(3)    -Dye Allergies.

## 2020-12-06 ENCOUNTER — Ambulatory Visit: Payer: PPO | Admitting: Physical Medicine and Rehabilitation

## 2020-12-06 ENCOUNTER — Encounter: Payer: Self-pay | Admitting: Physical Medicine and Rehabilitation

## 2020-12-06 ENCOUNTER — Other Ambulatory Visit: Payer: Self-pay

## 2020-12-06 ENCOUNTER — Ambulatory Visit: Payer: Self-pay

## 2020-12-06 VITALS — BP 133/82 | HR 70

## 2020-12-06 DIAGNOSIS — M5416 Radiculopathy, lumbar region: Secondary | ICD-10-CM | POA: Diagnosis not present

## 2020-12-06 MED ORDER — BETAMETHASONE SOD PHOS & ACET 6 (3-3) MG/ML IJ SUSP
12.0000 mg | Freq: Once | INTRAMUSCULAR | Status: AC
  Administered 2020-12-06: 12 mg

## 2020-12-06 NOTE — Progress Notes (Signed)
Sharon [name] - 70 y.o. female MRN [id number]  Date of birth: 1951-06-11  Office Visit Note: Visit Date: 12/06/2020 PCP: Michael Boston, MD Referred by: Michael Boston, MD  Subjective: Chief Complaint  Patient presents with  . Lower Back - Pain   HPI:  Sharon [name] is a 70 y.o. female who comes in today for planned Left S1-2 Lumbar epidural steroid injection with fluoroscopic guidance.  The patient has failed conservative care including home exercise, medications, time and activity modification.  This injection will be diagnostic and hopefully therapeutic.  Please see requesting physician notes for further details and justification.   ROS Otherwise per HPI.  Assessment & Plan: Visit Diagnoses:    ICD-10-CM   1. Lumbar radiculopathy  M54.16 XR C-ARM NO REPORT    Epidural Steroid injection    betamethasone acetate-betamethasone sodium phosphate (CELESTONE) injection 12 mg    Plan: No additional findings.   Meds & Orders:  Meds ordered this encounter  Medications  . betamethasone acetate-betamethasone sodium phosphate (CELESTONE) injection 12 mg    Orders Placed This Encounter  Procedures  . XR C-ARM NO REPORT  . Epidural Steroid injection    Follow-up: Return if symptoms worsen or fail to improve.   Procedures: No procedures performed  S1 Lumbosacral Transforaminal Epidural Steroid Injection - Sub-Pedicular Approach with Fluoroscopic Guidance   Patient: Sharon [name]      Date of Birth: 06/23/1951 MRN: [id number] PCP: Michael Boston, MD      Visit Date: 12/06/2020   Universal Protocol:    Date/Time: 02/17/223:50 PM  Consent Given By: the patient  Position:  PRONE  Additional Comments: Vital signs were monitored before and after the procedure. Patient was prepped and draped in the usual sterile fashion. The correct patient, procedure, and site was verified.   Injection Procedure Details:  Procedure Site One Meds Administered:  Meds ordered this  encounter  Medications  . betamethasone acetate-betamethasone sodium phosphate (CELESTONE) injection 12 mg    Laterality: Left  Location/Site:  S1 Foramen   Needle size: 22 ga.  Needle type: Spinal  Needle Placement: Transforaminal  Findings:   -Comments: Excellent flow of contrast along the nerve, nerve root and into the epidural space.  Epidurogram: Contrast epidurogram showed no nerve root cut off or restricted flow pattern.  Procedure Details: After squaring off the sacral end-plate to get a true AP view, the C-arm was positioned so that the best possible view of the S1 foramen was visualized. The soft tissues overlying this structure were infiltrated with 2-3 ml. of 1% Lidocaine without Epinephrine.    The spinal needle was inserted toward the target using a "trajectory" view along the fluoroscope beam.  Under AP and lateral visualization, the needle was advanced so it did not puncture dura. Biplanar projections were used to confirm position. Aspiration was confirmed to be negative for CSF and/or blood. A 1-2 ml. volume of Isovue-250 was injected and flow of contrast was noted at each level. Radiographs were obtained for documentation purposes.   After attaining the desired flow of contrast documented above, a 0.5 to 1.0 ml test dose of 0.25% Marcaine was injected into each respective transforaminal space.  The patient was observed for 90 seconds post injection.  After no sensory deficits were reported, and normal lower extremity motor function was noted,   the above injectate was administered so that equal amounts of the injectate were placed at each foramen (level) into the transforaminal epidural space.  Additional Comments:  The patient tolerated the procedure well Dressing: Band-Aid with 2 x 2 sterile gauze    Post-procedure details: Patient was observed during the procedure. Post-procedure instructions were reviewed.  Patient left the clinic in stable  condition.     Clinical History: MRI LUMBAR SPINE WITHOUT CONTRAST  TECHNIQUE: Multiplanar, multisequence MR imaging of the lumbar spine was performed. No intravenous contrast was administered.  COMPARISON:  Lumbar MRI 01/18/2006  FINDINGS: Segmentation:  Normal  Alignment:  Normal  Vertebrae:  Negative for fracture or mass  Conus medullaris and cauda equina: Conus extends to the L2 level. Conus and cauda equina appear normal.  Paraspinal and other soft tissues: 24 mm cyst posterior liver on the right, this was not included on the prior study. This appears benign. No retroperitoneal mass or adenopathy.  Disc levels:  L1-2: Negative  L2-3: Mild disc bulging without stenosis  L3-4: Mild disc bulging and endplate spurring with progression from the prior study. Mild spinal stenosis has progressed.  L4-5: Mild disc and facet degeneration with mild progression. No significant stenosis.  L5-S1: Disc space narrowing with disc degeneration and spurring with mild progression. Mild foraminal narrowing bilaterally.  IMPRESSION: Mild spinal stenosis L3-4 with progressive degenerative change since the prior study.  Mild progression of disc and facet degeneration L4-5  Mild progression of disc degeneration L5-S1 with mild foraminal narrowing bilaterally.   Electronically Signed   By: Franchot Gallo M.D.   On: 11/04/2017 10:33     Plain film x-ray of the pelvis 06/19/2017  AP the pelvis and lateral left hip were obtained. These are compared to  films that were performed in December. There is progressive yet slight  increased narrowing of the left hip joint space associated with  subchondral sclerosis and subchondral cysts particularly on the femoral  head. On is consistent with progressive osteoarthritis     Objective:  VS:  HT:    WT:   BMI:     BP:133/82  HR:70bpm  TEMP: ( )  RESP:95 % Physical Exam Vitals and nursing note reviewed.   Constitutional:      General: She is not in acute distress.    Appearance: Normal appearance. She is not ill-appearing.  HENT:     Head: Normocephalic and atraumatic.     Right Ear: External ear normal.     Left Ear: External ear normal.  Eyes:     Extraocular Movements: Extraocular movements intact.  Cardiovascular:     Rate and Rhythm: Normal rate.     Pulses: Normal pulses.  Pulmonary:     Effort: Pulmonary effort is normal. No respiratory distress.  Abdominal:     General: There is no distension.     Palpations: Abdomen is soft.  Musculoskeletal:        General: Tenderness present.     Cervical back: Neck supple.     Right lower leg: No edema.     Left lower leg: No edema.     Comments: Patient has good distal strength with no pain over the greater trochanters.  No clonus or focal weakness.  Skin:    Findings: No erythema, lesion or rash.  Neurological:     General: No focal deficit present.     Mental Status: She is alert and oriented to person, place, and time.     Sensory: No sensory deficit.     Motor: No weakness or abnormal muscle tone.     Coordination: Coordination normal.  Psychiatric:  Mood and Affect: Mood normal.        Behavior: Behavior normal.      Imaging: No results found.

## 2020-12-06 NOTE — Progress Notes (Signed)
Numeric Pain Rating Scale and Functional Assessment Average Pain 2   In the last MONTH (on 0-10 scale) has pain interfered with the following?  1. General activity like being  able to carry out your everyday physical activities such as walking, climbing stairs, carrying groceries, or moving a chair?  Rating(5) Left sided hip and lower back pain. Bending over is really painful.  +Driver, -BT, -Dye Allergies.

## 2020-12-06 NOTE — Procedures (Signed)
S1 Lumbosacral Transforaminal Epidural Steroid Injection - Sub-Pedicular Approach with Fluoroscopic Guidance   Patient: Sharon [name]      Date of Birth: 1950-11-03 MRN: [id number] PCP: Michael Boston, MD      Visit Date: 12/06/2020   Universal Protocol:    Date/Time: 02/17/223:50 PM  Consent Given By: the patient  Position:  PRONE  Additional Comments: Vital signs were monitored before and after the procedure. Patient was prepped and draped in the usual sterile fashion. The correct patient, procedure, and site was verified.   Injection Procedure Details:  Procedure Site One Meds Administered:  Meds ordered this encounter  Medications  . betamethasone acetate-betamethasone sodium phosphate (CELESTONE) injection 12 mg    Laterality: Left  Location/Site:  S1 Foramen   Needle size: 22 ga.  Needle type: Spinal  Needle Placement: Transforaminal  Findings:   -Comments: Excellent flow of contrast along the nerve, nerve root and into the epidural space.  Epidurogram: Contrast epidurogram showed no nerve root cut off or restricted flow pattern.  Procedure Details: After squaring off the sacral end-plate to get a true AP view, the C-arm was positioned so that the best possible view of the S1 foramen was visualized. The soft tissues overlying this structure were infiltrated with 2-3 ml. of 1% Lidocaine without Epinephrine.    The spinal needle was inserted toward the target using a "trajectory" view along the fluoroscope beam.  Under AP and lateral visualization, the needle was advanced so it did not puncture dura. Biplanar projections were used to confirm position. Aspiration was confirmed to be negative for CSF and/or blood. A 1-2 ml. volume of Isovue-250 was injected and flow of contrast was noted at each level. Radiographs were obtained for documentation purposes.   After attaining the desired flow of contrast documented above, a 0.5 to 1.0 ml test dose of 0.25% Marcaine  was injected into each respective transforaminal space.  The patient was observed for 90 seconds post injection.  After no sensory deficits were reported, and normal lower extremity motor function was noted,   the above injectate was administered so that equal amounts of the injectate were placed at each foramen (level) into the transforaminal epidural space.   Additional Comments:  The patient tolerated the procedure well Dressing: Band-Aid with 2 x 2 sterile gauze    Post-procedure details: Patient was observed during the procedure. Post-procedure instructions were reviewed.  Patient left the clinic in stable condition.

## 2020-12-06 NOTE — Patient Instructions (Signed)

## 2021-01-02 DIAGNOSIS — H9202 Otalgia, left ear: Secondary | ICD-10-CM | POA: Diagnosis not present

## 2021-01-02 DIAGNOSIS — E785 Hyperlipidemia, unspecified: Secondary | ICD-10-CM | POA: Diagnosis not present

## 2021-01-04 ENCOUNTER — Telehealth: Payer: Self-pay | Admitting: Physical Medicine and Rehabilitation

## 2021-01-04 DIAGNOSIS — M5416 Radiculopathy, lumbar region: Secondary | ICD-10-CM

## 2021-01-04 NOTE — Telephone Encounter (Signed)
Patient called. Sharon [name] would like Dr. Ernestina Patches to know that Sharon [name] is still in pain. Her call back nunber is [phone]

## 2021-01-07 ENCOUNTER — Encounter: Payer: Self-pay | Admitting: Physical Medicine and Rehabilitation

## 2021-01-07 NOTE — Telephone Encounter (Signed)
MRI from 2019 would not explain no symptoms at all and typically without injection if it helped at all may be there is something in the spine that we need to look for.  She needs updated MRI of the lumbar spine.  Depending on how she is doing right now could add muscle relaxer/antispasm medication such as baclofen or Flexeril.

## 2021-01-07 NOTE — Telephone Encounter (Signed)
Patient called she is returning your call.

## 2021-01-07 NOTE — Telephone Encounter (Signed)
Left message #1

## 2021-01-07 NOTE — Telephone Encounter (Signed)
MRI ordered and patient notified. She will call back if she wants to try a muscle relaxer.

## 2021-01-07 NOTE — Telephone Encounter (Signed)
Patient had left S1 TF on 2/17. She reports good relief for a short time. Now having spasms in back with bending and some numbness in left leg. Please advise.

## 2021-01-07 NOTE — Addendum Note (Signed)
Addended by: Sherre Scarlet B on: 01/07/2021 12:49 PM   Modules accepted: Orders

## 2021-01-10 ENCOUNTER — Other Ambulatory Visit: Payer: Self-pay

## 2021-01-10 ENCOUNTER — Encounter: Payer: Self-pay | Admitting: Orthopaedic Surgery

## 2021-01-10 ENCOUNTER — Ambulatory Visit: Payer: Self-pay

## 2021-01-10 ENCOUNTER — Ambulatory Visit: Payer: PPO | Admitting: Orthopaedic Surgery

## 2021-01-10 VITALS — Ht 65.0 in | Wt 167.0 lb

## 2021-01-10 DIAGNOSIS — M25512 Pain in left shoulder: Secondary | ICD-10-CM | POA: Diagnosis not present

## 2021-01-10 DIAGNOSIS — G8929 Other chronic pain: Secondary | ICD-10-CM

## 2021-01-10 MED ORDER — BUPIVACAINE HCL 0.25 % IJ SOLN
2.0000 mL | INTRAMUSCULAR | Status: AC | PRN
  Administered 2021-01-10: 2 mL via INTRA_ARTICULAR

## 2021-01-10 MED ORDER — LIDOCAINE HCL 2 % IJ SOLN
2.0000 mL | INTRAMUSCULAR | Status: AC | PRN
Start: 2021-01-10 — End: 2021-01-10
  Administered 2021-01-10: 2 mL

## 2021-01-10 NOTE — Progress Notes (Signed)
Office Visit Note   Patient: Sharon [name]           Date of Birth: 1951-08-05           MRN: [id number] Visit Date: 01/10/2021              Requested by: Michael Boston, MD [address] [place],  [place] [address] PCP: Michael Boston, MD   Assessment & Plan: Visit Diagnoses:  1. Chronic left shoulder pain     Plan: Sharon [name] is seen today for evaluation of several issues.  She has had chronic neck pain and in fact had films a year ago demonstrating significant mid cervical arthritis.  She does have some stiffness and achiness but really not having true radicular pain.  She notes that she can "live with it" and does not use over-the-counter medicines and exercises.  She tries to workout regularly at the [facility].  She recently had a follow-up visit with Dr. Ninfa Linden who performed a left total hip replacement complaining of some back discomfort.  She was referred to Dr. Ernestina Patches who performed an epidural steroid injection.  Past not sure it made much of a difference so he has ordered an MRI scan.  She is also having multiple joint and extremity pain.  Her family physician stopped her statin drugs that she has been taking for 4 years last week.  She has a follow-up appointment in 3 to 4 weeks at which point she will get some lab work.  I would suggest that she have CBC, sed rate, C-reactive protein and an RA factor in addition to other lab work that her family physician will have ordered.  The other issue is with her left shoulder.  She has had some recurrent pain.  She had films of her shoulder in 2017 that did not reveal any abnormalities.  She has had some discomfort with overhead activity but no injury or trauma or trouble sleeping.  Her exam today was relatively benign with minimally positive empty can testing but negative impingement and negative Speed sign.  Excellent range of motion and full quick overhead movement.  Films were nondiagnostic.  Will inject the subacromial space with Depo-Medrol and  monitor response.  Always consider MRI scan at some point in the future  Follow-Up Instructions: No follow-ups on file.   Orders:  Orders Placed This Encounter  Procedures  . XR Shoulder Left   No orders of the defined types were placed in this encounter.     Procedures: Large Joint Inj: L subacromial bursa on 01/10/2021 4:30 PM Indications: pain and diagnostic evaluation Details: 25 G 1.5 in needle, anterolateral approach  Arthrogram: No  Medications: 2 mL lidocaine 2 %; 2 mL bupivacaine 0.25 %  12 mg betamethasone injected in the subacromial space left shoulder with Xylocaine and Marcaine Consent was given by the patient. Immediately prior to procedure a time out was called to verify the correct patient, procedure, equipment, support staff and site/side marked as required. Patient was prepped and draped in the usual sterile fashion.       Clinical Data: No additional findings.   Subjective: Chief Complaint  Patient presents with  . Left Shoulder - Pain  Patient presents today for left arm pain. She said that it has been going on for weeks on and off. She states that she has multiple muscle aches throughout her body. She also has left rib and lower back pain. She saw Dr.Newton a month ago and received  an L-Spine injection. She states that the injection did not help, and Dr.Newton has now ordered a new MRI of her lower back. She has tingling into her left hand and fingers, with some neck pain as well. She takes Methocarbamol as needed.  HPI  Review of Systems   Objective: Vital Signs: Ht 5\' 5"  (1.651 m)   Wt 167 lb (75.8 kg)   LMP 02/17/1989   BMI 27.79 kg/m   Physical Exam Constitutional:      Appearance: She is well-developed.  Eyes:     Pupils: Pupils are equal, round, and reactive to light.  Pulmonary:     Effort: Pulmonary effort is normal.  Skin:    General: Skin is warm and dry.  Neurological:     Mental Status: She is alert and oriented to person,  place, and time.  Psychiatric:        Behavior: Behavior normal.     Ortho Exam excellent range of motion of cervical spine considering she has some degenerative changes by x-ray from films last year.  She is able to easily touch her chin to her chest and had almost full neck extension.  She will but a "soreness" behind her neck posteriorly but no referred pain to either upper extremity.  Excellent motion both to the right and to the left with rotation without any referred discomfort.  Left shoulder had negative impingement and minimally empty can testing.  Negative Speed sign.  Good strength.  Biceps intact.  Skin intact.  Good grip and release.  No Tinel's over the median nerve with good opposition of thumb the little finger  Specialty Comments:  No specialty comments available.  Imaging: No results found.   PMFS History: Patient Active Problem List   Diagnosis Date Noted  . Pain in left shoulder 01/10/2021  . Neck pain, acute 04/11/2020  . Pain in right ankle and joints of right foot 07/26/2019  . Status post total replacement of left hip 07/20/2018  . Unilateral primary osteoarthritis, left hip 05/25/2018  . Genetic testing 11/14/2016  . Family history of ovarian cancer   . Family history of colon cancer   . Family history of uterine cancer   . Family history of stomach cancer   . Osteoarthritis of left knee 04/03/2015  . S/P total knee replacement using cement 04/03/2015  . Gait abnormality 12/22/2012  . Swelling of ankle 12/22/2012  . Ringworm   . Hypertension   . Fibroid   . Headache(784.0)   . Arthritis   . Vitamin D deficiency    Past Medical History:  Diagnosis Date  . Cataract   . Family history of adverse reaction to anesthesia    "mom did; don't know what happened" (07/20/2018)  . Family history of colon cancer   . Family history of ovarian cancer   . Family history of stomach cancer   . Family history of uterine cancer   . Fibroid   . Heart murmur     "tiny, tiny one" (07/20/2018)  . High cholesterol   . Hypertension   . Migraine    "a few/year; last one was 07/19/2018" (07/20/2018)  . Osteoarthritis    "knees, hips, hands, probably in my back" (07/20/2018)  . Ringworm   . Vitamin D deficiency     Family History  Problem Relation Age of Onset  . Ovarian cancer Mother 29  . Heart disease Maternal Grandfather   . Hypertension Paternal Grandmother   . Ovarian cancer Cousin  maternal first cousin dx in her 15s  . Heart disease Father   . Atrial fibrillation Father   . Heart disease Brother   . Heart disease Brother   . Colon cancer Maternal Aunt        dx in her 105s-60  . Diabetes Paternal Aunt   . Ovarian cancer Maternal Grandmother        dx in her mid 64s  . Cancer Maternal Grandmother        OVARIAN AND UTERINE  . Uterine cancer Maternal Grandmother        dx in her early 40s  . Stomach cancer Maternal Uncle        dx in his 65s-80s  . Stroke Maternal Uncle   . Leukemia Paternal Uncle     Past Surgical History:  Procedure Laterality Date  . APPENDECTOMY  1969  . BUNIONECTOMY Bilateral   . JOINT REPLACEMENT    . KNEE ARTHROSCOPY Left 11/2013  . Precancerous lesion excised     "arm"  . TONSILLECTOMY    . TOTAL ABDOMINAL HYSTERECTOMY  1990   TAH BSO  . TOTAL HIP ARTHROPLASTY Left 07/20/2018  . TOTAL HIP ARTHROPLASTY Left 07/20/2018   Procedure: LEFT TOTAL HIP ARTHROPLASTY ANTERIOR APPROACH;  Surgeon: Mcarthur Rossetti, MD;  Location: [facility];  Service: Orthopedics;  Laterality: Left;  . TOTAL KNEE ARTHROPLASTY Right 2012  . TOTAL KNEE ARTHROPLASTY Left 04/03/2015   Procedure: TOTAL KNEE ARTHROPLASTY;  Surgeon: Garald Balding, MD;  Location: [facility];  Service: Orthopedics;  Laterality: Left;   Social History   Occupational History  . Not on file  Tobacco Use  . Smoking status: Never Smoker  . Smokeless tobacco: Never Used  Vaping Use  . Vaping Use: Never used  Substance and Sexual Activity  . Alcohol  use: Yes    Alcohol/week: 1.0 - 2.0 standard drink    Types: 1 - 2 Standard drinks or equivalent per week  . Drug use: No  . Sexual activity: Not Currently    Partners: Male    Birth control/protection: Surgical, Post-menopausal

## 2021-01-21 DIAGNOSIS — H52201 Unspecified astigmatism, right eye: Secondary | ICD-10-CM | POA: Diagnosis not present

## 2021-01-21 DIAGNOSIS — H2511 Age-related nuclear cataract, right eye: Secondary | ICD-10-CM | POA: Diagnosis not present

## 2021-01-22 DIAGNOSIS — H25042 Posterior subcapsular polar age-related cataract, left eye: Secondary | ICD-10-CM | POA: Diagnosis not present

## 2021-01-22 DIAGNOSIS — H2512 Age-related nuclear cataract, left eye: Secondary | ICD-10-CM | POA: Diagnosis not present

## 2021-01-22 DIAGNOSIS — H25012 Cortical age-related cataract, left eye: Secondary | ICD-10-CM | POA: Diagnosis not present

## 2021-01-26 ENCOUNTER — Other Ambulatory Visit: Payer: Self-pay

## 2021-01-26 ENCOUNTER — Ambulatory Visit
Admission: RE | Admit: 2021-01-26 | Discharge: 2021-01-26 | Disposition: A | Discharge status: Home / Self Care | Payer: PPO | Source: Ambulatory Visit | Attending: Physical Medicine and Rehabilitation | Admitting: Physical Medicine and Rehabilitation

## 2021-01-26 DIAGNOSIS — M48061 Spinal stenosis, lumbar region without neurogenic claudication: Secondary | ICD-10-CM | POA: Diagnosis not present

## 2021-01-26 DIAGNOSIS — M5416 Radiculopathy, lumbar region: Secondary | ICD-10-CM

## 2021-01-26 DIAGNOSIS — M545 Low back pain, unspecified: Secondary | ICD-10-CM | POA: Diagnosis not present

## 2021-01-30 ENCOUNTER — Other Ambulatory Visit: Payer: PPO

## 2021-02-12 ENCOUNTER — Other Ambulatory Visit: Payer: Self-pay

## 2021-02-12 ENCOUNTER — Ambulatory Visit: Payer: PPO | Admitting: Physical Medicine and Rehabilitation

## 2021-02-12 ENCOUNTER — Ambulatory Visit: Payer: Self-pay

## 2021-02-12 ENCOUNTER — Encounter: Payer: Self-pay | Admitting: Physical Medicine and Rehabilitation

## 2021-02-12 VITALS — BP 108/73 | HR 75

## 2021-02-12 DIAGNOSIS — M5416 Radiculopathy, lumbar region: Secondary | ICD-10-CM | POA: Diagnosis not present

## 2021-02-12 MED ORDER — BETAMETHASONE SOD PHOS & ACET 6 (3-3) MG/ML IJ SUSP
12.0000 mg | Freq: Once | INTRAMUSCULAR | Status: AC
  Administered 2021-02-12: 12 mg

## 2021-02-12 NOTE — Patient Instructions (Signed)

## 2021-02-12 NOTE — Progress Notes (Signed)
Pt state lower back pain that travels to her left hip and butt area. Pt state sitting and bending makes the pain worse. Pt state she take pain meds to help ease her pain. Pt has hx of inj 12/06/20 pt state it helped.  Numeric Pain Rating Scale and Functional Assessment Average Pain 4   In the last MONTH (on 0-10 scale) has pain interfered with the following?  1. General activity like being  able to carry out your everyday physical activities such as walking, climbing stairs, carrying groceries, or moving a chair?  Rating(5)   +Driver, -BT, -Dye Allergies.

## 2021-02-15 DIAGNOSIS — E559 Vitamin D deficiency, unspecified: Secondary | ICD-10-CM | POA: Diagnosis not present

## 2021-02-15 DIAGNOSIS — E785 Hyperlipidemia, unspecified: Secondary | ICD-10-CM | POA: Diagnosis not present

## 2021-02-22 DIAGNOSIS — Z0000 Encounter for general adult medical examination without abnormal findings: Secondary | ICD-10-CM | POA: Diagnosis not present

## 2021-02-22 DIAGNOSIS — I10 Essential (primary) hypertension: Secondary | ICD-10-CM | POA: Diagnosis not present

## 2021-02-22 DIAGNOSIS — Z1339 Encounter for screening examination for other mental health and behavioral disorders: Secondary | ICD-10-CM | POA: Diagnosis not present

## 2021-02-22 DIAGNOSIS — Z1331 Encounter for screening for depression: Secondary | ICD-10-CM | POA: Diagnosis not present

## 2021-02-22 DIAGNOSIS — E785 Hyperlipidemia, unspecified: Secondary | ICD-10-CM | POA: Diagnosis not present

## 2021-02-22 DIAGNOSIS — E663 Overweight: Secondary | ICD-10-CM | POA: Diagnosis not present

## 2021-02-22 DIAGNOSIS — Z13820 Encounter for screening for osteoporosis: Secondary | ICD-10-CM | POA: Diagnosis not present

## 2021-02-22 DIAGNOSIS — Z6825 Body mass index (BMI) 25.0-25.9, adult: Secondary | ICD-10-CM | POA: Diagnosis not present

## 2021-02-22 DIAGNOSIS — M159 Polyosteoarthritis, unspecified: Secondary | ICD-10-CM | POA: Diagnosis not present

## 2021-02-22 DIAGNOSIS — K30 Functional dyspepsia: Secondary | ICD-10-CM | POA: Diagnosis not present

## 2021-02-22 DIAGNOSIS — K7689 Other specified diseases of liver: Secondary | ICD-10-CM | POA: Diagnosis not present

## 2021-02-25 DIAGNOSIS — H2512 Age-related nuclear cataract, left eye: Secondary | ICD-10-CM | POA: Diagnosis not present

## 2021-02-25 DIAGNOSIS — H52202 Unspecified astigmatism, left eye: Secondary | ICD-10-CM | POA: Diagnosis not present

## 2021-03-08 NOTE — Progress Notes (Signed)
Sharon [name] - 70 y.o. female MRN [id number]  Date of birth: 02-09-1951  Office Visit Note: Visit Date: 02/12/2021 PCP: Michael Boston, MD Referred by: Michael Boston, MD  Subjective: Chief Complaint  Patient presents with  . Lower Back - Pain   HPI:  Sharon [name] is a 70 y.o. female who comes in today For follow-up of low back pain and left buttock pain status post MRI.  Patient's MRI reviewed with her today with spine models and imaging shows some progressive degenerative change from the last MRI in 2019 specifically in the upper lumbar region discogenic edema but that really would not correspond with the pain she is having in the left buttock region.  There is arthritic changes at L5-S1 with some subarticular narrowing which could cause pain in the buttocks but it is actually a bit more right than left on the imaging.  Patient has a history of multiple joint arthritis with bilateral knee replacement and one hip replaced and a history of migraines etc.  No history of fibromyalgia.  I do think at this point given the prior history and her continued pain level into the buttock region it would be worthwhile completing a diagnostic left L5-S1 interlaminar epidural steroid injection.  Consideration for facet joint block and regrouping with physical therapy or chiropractic care.  ROS Otherwise per HPI.  Assessment & Plan: Visit Diagnoses:    ICD-10-CM   1. Lumbar radiculopathy  M54.16 XR C-ARM NO REPORT    Epidural Steroid injection    betamethasone acetate-betamethasone sodium phosphate (CELESTONE) injection 12 mg    Plan: No additional findings.   Meds & Orders:  Meds ordered this encounter  Medications  . betamethasone acetate-betamethasone sodium phosphate (CELESTONE) injection 12 mg    Orders Placed This Encounter  Procedures  . XR C-ARM NO REPORT  . Epidural Steroid injection    Follow-up: Return if symptoms worsen or fail to improve.   Procedures: No procedures  performed  Lumbar Epidural Steroid Injection - Interlaminar Approach with Fluoroscopic Guidance  Patient: Sharon [name]      Date of Birth: 08/31/1951 MRN: [id number] PCP: Michael Boston, MD      Visit Date: 02/12/2021   Universal Protocol:     Consent Given By: the patient  Position: PRONE  Additional Comments: Vital signs were monitored before and after the procedure. Patient was prepped and draped in the usual sterile fashion. The correct patient, procedure, and site was verified.   Injection Procedure Details:   Procedure diagnoses: Lumbar radiculopathy [M54.16]   Meds Administered:  Meds ordered this encounter  Medications  . betamethasone acetate-betamethasone sodium phosphate (CELESTONE) injection 12 mg     Laterality: Left  Location/Site:  L5-S1  Needle: 3.5 in., 20 ga. Tuohy  Needle Placement: Paramedian epidural  Findings:   -Comments: Excellent flow of contrast into the epidural space.  Procedure Details: Using a paramedian approach from the side mentioned above, the region overlying the inferior lamina was localized under fluoroscopic visualization and the soft tissues overlying this structure were infiltrated with 4 ml. of 1% Lidocaine without Epinephrine. The Tuohy needle was inserted into the epidural space using a paramedian approach.   The epidural space was localized using loss of resistance along with counter oblique bi-planar fluoroscopic views.  After negative aspirate for air, blood, and CSF, a 2 ml. volume of Isovue-250 was injected into the epidural space and the flow of contrast was observed. Radiographs were obtained for documentation purposes.  The injectate was administered into the level noted above.   Additional Comments:  The patient tolerated the procedure well Dressing: 2 x 2 sterile gauze and Band-Aid    Post-procedure details: Patient was observed during the procedure. Post-procedure instructions were reviewed.  Patient  left the clinic in stable condition.     Clinical History: CLINICAL DATA:  Low back pain. Left hip and leg pain  EXAM: MRI LUMBAR SPINE WITHOUT CONTRAST  TECHNIQUE: Multiplanar, multisequence MR imaging of the lumbar spine was performed. No intravenous contrast was administered.  COMPARISON:  MRI lumbar spine 11/04/2017  FINDINGS: Segmentation:  Normal  Alignment:  Mild to moderate dextroscoliosis.  Mild retrolisthesis L1-2 and L5-S1.  Vertebrae: Negative for fracture or mass. Discogenic edema at L1-2 has developed since the prior study with progressive disc degeneration.  Conus medullaris and cauda equina: Conus extends to the L2 level. Conus and cauda equina appear normal.  Paraspinal and other soft tissues: 3.5 cm cyst in the medial liver posteriorly has enlarged. Previously 2.4 cm. Benign characteristics of the cyst.  Disc levels:  T12-L1: Mild disc degeneration.  Negative for stenosis.  L1-2: Progressive disc degeneration with disc space narrowing and discogenic edema. Progressive retrolisthesis. Diffuse disc bulging and mild spurring without significant stenosis.  L2-3: Shallow central disc protrusion. Mild facet degeneration. Negative for stenosis  L3-4: Mild disc degeneration and disc bulging asymmetric to the left. Bilateral facet hypertrophy. Mild subarticular stenosis on the left  L4-5: Mild disc bulging. Moderate facet hypertrophy. Mild subarticular stenosis bilaterally with mild progression.  L5-S1: Moderate disc degeneration. Disc space narrowing and diffuse endplate spurring. Mild facet hypertrophy. Moderate subarticular and foraminal stenosis on the right. Moderate left subarticular stenosis. No interval change.  IMPRESSION: Progressive disc degeneration L1-2 with discogenic edema and retrolisthesis. No significant stenosis  Mild subarticular stenosis on the left at L3-4 unchanged  Mild subarticular stenosis bilaterally  L4-5 with progression  Moderate subarticular and foraminal stenosis on the right L5-S1, unchanged. Moderate left subarticular stenosis L5-S1 also unchanged.   Electronically Signed   By: Franchot Gallo M.D.   On: 01/28/2021 11:07  -------   Plain film x-ray of the pelvis 06/19/2017  AP the pelvis and lateral left hip were obtained. These are compared to  films that were performed in December. There is progressive yet slight  increased narrowing of the left hip joint space associated with  subchondral sclerosis and subchondral cysts particularly on the femoral  head. On is consistent with progressive osteoarthritis     Objective:  VS:  HT:    WT:   BMI:     BP:108/73  HR:75bpm  TEMP: ( )  RESP:  Physical Exam Vitals and nursing note reviewed.  Constitutional:      General: She is not in acute distress.    Appearance: Normal appearance. She is not ill-appearing.  HENT:     Head: Normocephalic and atraumatic.     Right Ear: External ear normal.     Left Ear: External ear normal.  Eyes:     Extraocular Movements: Extraocular movements intact.  Cardiovascular:     Rate and Rhythm: Normal rate.     Pulses: Normal pulses.  Pulmonary:     Effort: Pulmonary effort is normal. No respiratory distress.  Abdominal:     General: There is no distension.     Palpations: Abdomen is soft.  Musculoskeletal:        General: Tenderness present.     Cervical back: Neck supple.  Right lower leg: No edema.     Left lower leg: No edema.     Comments: Patient has good distal strength with no pain over the greater trochanters.  No clonus or focal weakness.  Skin:    Findings: No erythema, lesion or rash.  Neurological:     General: No focal deficit present.     Mental Status: She is alert and oriented to person, place, and time.     Sensory: No sensory deficit.     Motor: No weakness or abnormal muscle tone.     Coordination: Coordination normal.  Psychiatric:        Mood  and Affect: Mood normal.        Behavior: Behavior normal.      Imaging: No results found.

## 2021-03-08 NOTE — Procedures (Signed)
Lumbar Epidural Steroid Injection - Interlaminar Approach with Fluoroscopic Guidance  Patient: Sharon [name]      Date of Birth: 11-Jul-1951 MRN: [id number] PCP: Michael Boston, MD      Visit Date: 02/12/2021   Universal Protocol:     Consent Given By: the patient  Position: PRONE  Additional Comments: Vital signs were monitored before and after the procedure. Patient was prepped and draped in the usual sterile fashion. The correct patient, procedure, and site was verified.   Injection Procedure Details:   Procedure diagnoses: Lumbar radiculopathy [M54.16]   Meds Administered:  Meds ordered this encounter  Medications  . betamethasone acetate-betamethasone sodium phosphate (CELESTONE) injection 12 mg     Laterality: Left  Location/Site:  L5-S1  Needle: 3.5 in., 20 ga. Tuohy  Needle Placement: Paramedian epidural  Findings:   -Comments: Excellent flow of contrast into the epidural space.  Procedure Details: Using a paramedian approach from the side mentioned above, the region overlying the inferior lamina was localized under fluoroscopic visualization and the soft tissues overlying this structure were infiltrated with 4 ml. of 1% Lidocaine without Epinephrine. The Tuohy needle was inserted into the epidural space using a paramedian approach.   The epidural space was localized using loss of resistance along with counter oblique bi-planar fluoroscopic views.  After negative aspirate for air, blood, and CSF, a 2 ml. volume of Isovue-250 was injected into the epidural space and the flow of contrast was observed. Radiographs were obtained for documentation purposes.    The injectate was administered into the level noted above.   Additional Comments:  The patient tolerated the procedure well Dressing: 2 x 2 sterile gauze and Band-Aid    Post-procedure details: Patient was observed during the procedure. Post-procedure instructions were reviewed.  Patient left the  clinic in stable condition.

## 2021-03-11 DIAGNOSIS — Z0389 Encounter for observation for other suspected diseases and conditions ruled out: Secondary | ICD-10-CM | POA: Diagnosis not present

## 2021-04-09 ENCOUNTER — Ambulatory Visit: Payer: PPO | Admitting: Sports Medicine

## 2021-04-09 ENCOUNTER — Other Ambulatory Visit: Payer: Self-pay

## 2021-04-09 ENCOUNTER — Encounter: Payer: Self-pay | Admitting: Sports Medicine

## 2021-04-09 VITALS — BP 132/80 | Ht 66.0 in | Wt 163.0 lb

## 2021-04-09 DIAGNOSIS — M5416 Radiculopathy, lumbar region: Secondary | ICD-10-CM | POA: Diagnosis not present

## 2021-04-09 DIAGNOSIS — M79672 Pain in left foot: Secondary | ICD-10-CM | POA: Diagnosis not present

## 2021-04-09 DIAGNOSIS — M19079 Primary osteoarthritis, unspecified ankle and foot: Secondary | ICD-10-CM

## 2021-04-09 NOTE — Progress Notes (Signed)
   PCP: Michael Boston, MD  Subjective:   HPI: Patient is a 70 y.o. female with history of bilateral hallux valgus status post bunionectomy, left hip OA status post left hip replacement and multilevel lumbar degenerative disc disease with left-sided radicular symptoms being treated with epidural steroid injections who presents for evaluation of left foot pain.  She reports that ever since having her hip replaced, she has had continued pain going down her leg and intermittent foot pain in the midfoot.  She points mostly towards her dorsal midfoot as location of her pain.  Is worse after prolonged walking or standing.  She describes as an aching pain and feels like it is different from her radicular type pains.  No traumatic injury or fall prior to the onset of her symptoms.  Historically, she has had intermittent pains in her feet and has tried green insoles with Dr. Oneida Alar many years ago.  She also has tried over-the-counter orthotics with some benefit.  She presents today to see if custom orthotics would be beneficial.  Review of Systems:  Per HPI.   [place], medications and smoking status reviewed.      Objective:  Physical Exam: BP 132/80   Ht 5\' 6"  (1.676 m)   Wt 163 lb (73.9 kg)   LMP 02/17/1989   BMI 26.31 kg/m    No flowsheet data found.   Gen: awake, alert, NAD, comfortable in exam room Pulm: breathing unlabored  Left foot: -Inspection: Inspection of the foot does not show any significant ecchymoses erythema or edema.   -Gait: With standing, she has significant splaying of the second and third toes with collapse of the transverse arch.  She has mild collapse of the longitudinal arch bilaterally.  There is a Morton's callus over the plantar aspect of the second metatarsal head.  With walking, she has calcaneal valgus bilaterally along with collapse of the longitudinal arch.  Mild knee valgus.  Trendelenburg gait with more significant hip drop with the left foot planted during  stance phase. -Palpation: Tenderness to palpation diffusely across the TMT joints dorsally, otherwise nontender. -ROM: Full range of motion of the ankle, some mild restriction with extension of the first MTP which is equal bilaterally. -Strength: 5/5 strength in all planes of the ankle, good tibialis posterior function bilaterally.  4/5 strength with hip abduction bilaterally, weaker on the left. -Neurovascular intact in bilateral feet   Assessment & Plan:  1.  Left foot midfoot arthritis 2.  Left lumbar radiculopathy 3.  History of left hip OA status post hip replacement  Patient with signs and symptoms most consistent with TMT joint arthritis in the left foot.  She also has bilateral but L>R hip abduction weakness which may be contributing to her hip and foot pain.  Plan: -Metatarsal cookie placed on left OTC orthotic -Green insoles with small scaphoid pad bilaterally and left metatarsal cookie fashioned as well -Plan to trial both adjustments in her shoes over the next month, follow-up in 1 month to reevaluate and see how this affects her foot and hip pain -For hip abduction weakness, start HEP for hip abduction strengthening.  Dagoberto Ligas, MD [facility] Fellow 04/09/2021 10:16 AM  I observed and examined the patient with the [facility] resident and agree with assessment and plan.  Note reviewed and modified by me. She may be a candidate for a custom orthotic in the future unless she gets a good response from sport insole support.  Ila Mcgill, MD

## 2021-04-11 ENCOUNTER — Other Ambulatory Visit: Payer: Self-pay | Admitting: Orthopaedic Surgery

## 2021-04-12 NOTE — Telephone Encounter (Signed)
Ok to renew?  

## 2021-05-14 ENCOUNTER — Ambulatory Visit: Payer: PPO | Admitting: Sports Medicine

## 2021-05-14 ENCOUNTER — Other Ambulatory Visit: Payer: Self-pay

## 2021-05-14 VITALS — BP 106/62 | Ht 66.0 in | Wt 162.0 lb

## 2021-05-14 DIAGNOSIS — M19079 Primary osteoarthritis, unspecified ankle and foot: Secondary | ICD-10-CM | POA: Diagnosis not present

## 2021-05-14 DIAGNOSIS — Z96642 Presence of left artificial hip joint: Secondary | ICD-10-CM

## 2021-05-14 DIAGNOSIS — M25552 Pain in left hip: Secondary | ICD-10-CM

## 2021-05-14 DIAGNOSIS — M5416 Radiculopathy, lumbar region: Secondary | ICD-10-CM | POA: Diagnosis not present

## 2021-05-14 MED ORDER — DICLOFENAC SODIUM 1 % EX GEL
2.0000 g | Freq: Four times a day (QID) | CUTANEOUS | 1 refills | Status: DC
Start: 2021-05-14 — End: 2024-06-14

## 2021-05-14 NOTE — Patient Instructions (Signed)
It was great to meet you today, thank you for letting me participate in your care!  Today, we discussed: changing your metatarsal pad in your shoe, hopefully this feels better. Keep diligent with your exercises and lateral hip abduction in the pool and out of the pool  You will follow-up in about 3 months with Dr. Oneida Alar.  If you have any further questions, please give the clinic a call [phone].  Thanks,  Elba Barman, DO PGY-4, Sports Medicine Fellow [place]

## 2021-05-14 NOTE — Progress Notes (Signed)
PCP: Michael Boston, MD  Subjective:   HPI: Patient is a 70 y.o. female here for for follow-up of left foot midfoot arthritis and left hip pain.  At last visit, patient was given green insoles with a scaphoid pad bilaterally and a left metatarsal cookie fashion to the sole as well.  She was also given HEP for hip abduction.  Today, the patient states that she feels like she has gained some increase strength and range of motion in her hip with the exercises.  She still will occasionally have some numbness and tingling down the left leg, however this is not consistent.  She denies any weakness of the lower extremities.  In terms of her left foot, she still is experiencing pain with prolonged ambulation.  The pain is on the dorsum of the midfoot.  She does acknowledge that she has less pronation in her gait with the pads, however the metatarsal cookie pad does feel uncomfortable still at this time.  Has no new pain or injuries.  Past Surgical History:  Procedure Laterality Date   APPENDECTOMY  1969   BUNIONECTOMY Bilateral    JOINT REPLACEMENT     KNEE ARTHROSCOPY Left 11/2013   Precancerous lesion excised     "arm"   TONSILLECTOMY     TOTAL ABDOMINAL HYSTERECTOMY  1990   TAH BSO   TOTAL HIP ARTHROPLASTY Left 07/20/2018   TOTAL HIP ARTHROPLASTY Left 07/20/2018   Procedure: LEFT TOTAL HIP ARTHROPLASTY ANTERIOR APPROACH;  Surgeon: Mcarthur Rossetti, MD;  Location: [place];  Service: Orthopedics;  Laterality: Left;   TOTAL KNEE ARTHROPLASTY Right 2012   TOTAL KNEE ARTHROPLASTY Left 04/03/2015   Procedure: TOTAL KNEE ARTHROPLASTY;  Surgeon: Garald Balding, MD;  Location: [place];  Service: Orthopedics;  Laterality: Left;   BP 106/62   Ht '5\' 6"'$  (1.676 m)   Wt 162 lb (73.5 kg)   LMP 02/17/1989   BMI 26.15 kg/m   No flowsheet data found.  No flowsheet data found.  Review of Systems: See HPI above.     Objective:  Physical Exam:  Gen: Well-appearing, in no acute distress;  non-toxic CV: Regular Rate. Well-perfused. Warm.  Resp: Breathing unlabored on room air; no wheezing. Psych: Fluid speech in conversation; appropriate affect; normal thought process Neuro: Sensation intact throughout. No gross coordination deficits.  MSK:  Negative SLR b/l.  - Left hip: There is no significant tenderness to palpation.  No obvious rash, erythema or edema.  Passive logroll equivalent bilaterally.  Strength 5/5 in flexion/extension; left hip with 4/5 strength with lateral abduction compared to the contralateral hip.  Very slight antalgic gait with subtle Trendelenburg of the left hip. + Mildly positive FABERE test, - FADIR.  Left foot:  -Inspection: Inspection of the foot does not show any significant ecchymosis, erythema or edema.   -Gait: With standing, she has collapse of the transverse arch, with almost complete navicular collapse; there is significant splaying of the second and third toes. She has mild collapse of the longitudinal arch bilaterally.  With walking, she has calcaneal valgus bilaterally ( L > R) along with mild collapse of the longitudinal arch. Mild knee valgus, but does not cross midline. Very slight Trendelenburg gait with the left foot planted during stance phase. -Palpation: Mild Tenderness to palpation diffusely across the TMT joints dorsally, otherwise nontender. -ROM: Full range of motion of the ankle and toes. -Strength: 5/5 strength in all planes of the ankle, good tibialis posterior function bilaterally.  -Neurovascular intact  in bilateral feet   Assessment & Plan:  1. Left midfoot arthritis 2. Left hip pain, s/p left hip replacement 2/2 hip OA 3. Left lumbar radiculopathy, intermittent   The patient's left hip is slightly improving with the hip abduction strengthening exercises, although still having pain in the left foot and uncomfortable feeling with the metatarsal cookie pad.  We analyzed the gait today, which showed a decreased Trendelenburg  pattern with improvement in left knee valgus movement. We did switch out the metatarsal cookie pad with just a metatarsal pad and placed it on the green insoles in a slightly more distal location, which the patient states significantly improved her discomfort with ambulating right away. We will give her additional metatarsal pads that she may replace if worn down. The patient will also start trying voltaren gel 1% of the dorsal aspect of her TMT joints for pain relief. She will continue HEP for hip abduction and work on home exercises for the hip and core/low back strengthening in and out of the pool. She will f/u in 3 months.   Elba Barman, DO PGY-4, Sports Medicine Fellow [place]  I observed and examined the patient with the [facility] resident and agree with assessment and plan.  Note reviewed and modified by me. Ila Mcgill, MD

## 2021-06-10 DIAGNOSIS — R300 Dysuria: Secondary | ICD-10-CM | POA: Diagnosis not present

## 2021-07-07 ENCOUNTER — Encounter: Payer: Self-pay | Admitting: Physical Medicine and Rehabilitation

## 2021-07-07 NOTE — Progress Notes (Signed)
Sharon [name] - 70 y.o. female MRN [id number]  Date of birth: [birth year]  Office Visit Note: Visit Date: 12/04/2020 PCP: Michael Boston, MD Referred by: Michael Boston, MD  Subjective: Chief Complaint  Patient presents with   Lower Back - Pain   HPI: Sharon [name] is a 70 y.o. female who comes in today At the request of Dr. Jean Rosenthal for evaluation management of chronic worsening and severe left buttock and posterior hip and leg pain.  She describes average pain is 3 out of 10 but it is persistent and ongoing and really does limit what she can do at times.  She says occasional referral to the front thigh but the main point pain is posterior left buttock down the posterior leg.  She does get some knee pain.  No paresthesias specifically.  More of an aching pain.  Nothing on the right.  Prior lumbar spine MRI reviewed from 2019 showed general changes without any specific nerve compression.  No history of lumbar epidural injections or spine surgery etc.  No focal weakness.  Dr. Ninfa Linden was contemplating source of pain as being the sacroiliac joint versus nerve root irritation etc.  Review of Systems  Musculoskeletal:  Positive for back pain and joint pain.  All other systems reviewed and are negative. Otherwise per HPI.  Assessment & Plan: Visit Diagnoses:    ICD-10-CM   1. Lumbar radiculopathy  M54.16     2. Radiculopathy due to lumbar intervertebral disc disorder  M51.16     3. Pain in left hip  M25.552        Plan: Findings:  Chronic worsening severe at times left buttock and leg pain most consistent to my mind as an S1 radicular type pain although differential diagnosis would be sacroiliac joint mediated pain versus piriformis syndrome etc.  MRI from a couple years ago is not very revealing although there are some changes at the lower levels.  I think she may have a small disc protrusion or some lateral recess narrowing.  The neck step is diagnostic S1 transforaminal  injection.  I went over this with her at length today and she does want proceed with that.  If she gets absolutely no relief with that with look at possible sacroiliac joint injection versus MRI.   Meds & Orders: No orders of the defined types were placed in this encounter.  No orders of the defined types were placed in this encounter.   Follow-up: Return for Left S1 transforaminal injection with fluoroscopic guidance..   Procedures: No procedures performed      Clinical History:    She reports that she has never smoked. She has never used smokeless tobacco. No results for input(s): HGBA1C, LABURIC in the last 8760 hours.  Objective:  VS:  HT:    WT:   BMI:     BP:119/76  HR:66bpm  TEMP: ( )  RESP:  Physical Exam Vitals and nursing note reviewed.  Constitutional:      General: She is not in acute distress.    Appearance: Normal appearance. She is not ill-appearing.  HENT:     Head: Normocephalic and atraumatic.     Right Ear: External ear normal.     Left Ear: External ear normal.  Eyes:     Extraocular Movements: Extraocular movements intact.  Cardiovascular:     Rate and Rhythm: Normal rate.     Pulses: Normal pulses.  Pulmonary:     Effort: Pulmonary effort is  normal. No respiratory distress.  Abdominal:     General: There is no distension.     Palpations: Abdomen is soft.  Musculoskeletal:        General: Tenderness present.     Cervical back: Neck supple.     Right lower leg: No edema.     Left lower leg: No edema.     Comments: Patient has good distal strength with no pain over the greater trochanters.  No clonus or focal weakness.  Positive Fortin finger sign with pain over the left PSIS.  She has an equivocally positive slump test on the left.  She has a negative Patrick's testing although we did not stress the joint out.  Skin:    Findings: No erythema, lesion or rash.  Neurological:     General: No focal deficit present.     Mental Status: She is alert  and oriented to person, place, and time.     Sensory: No sensory deficit.     Motor: No weakness or abnormal muscle tone.     Coordination: Coordination normal.  Psychiatric:        Mood and Affect: Mood normal.        Behavior: Behavior normal.    Ortho Exam  Imaging: No results found.  Past Medical/Family/Surgical/Social History: Medications & Allergies reviewed per EMR, new medications updated. Patient Active Problem List   Diagnosis Date Noted   Pain in left shoulder 01/10/2021   Neck pain, acute 04/11/2020   Pain in right ankle and joints of right foot 07/26/2019   Status post total replacement of left hip 07/20/2018   Unilateral primary osteoarthritis, left hip 05/25/2018   Genetic testing 11/14/2016   Family history of ovarian cancer    Family history of colon cancer    Family history of uterine cancer    Family history of stomach cancer    Osteoarthritis of left knee 04/03/2015   S/P total knee replacement using cement 04/03/2015   Gait abnormality 12/22/2012   Swelling of ankle 12/22/2012   Ringworm    Hypertension    Fibroid    Headache(784.0)    Arthritis    Vitamin D deficiency    Past Medical History:  Diagnosis Date   Cataract    Family history of adverse reaction to anesthesia    "mom did; don't know what happened" (07/20/2018)   Family history of colon cancer    Family history of ovarian cancer    Family history of stomach cancer    Family history of uterine cancer    Fibroid    Heart murmur    "tiny, tiny one" (07/20/2018)   High cholesterol    Hypertension    Migraine    "a few/year; last one was 07/19/2018" (07/20/2018)   Osteoarthritis    "knees, hips, hands, probably in my back" (07/20/2018)   Ringworm    Vitamin D deficiency    Family History  Problem Relation Age of Onset   Ovarian cancer Mother 55   Heart disease Maternal Grandfather    Hypertension Paternal Grandmother    Ovarian cancer Cousin        maternal first cousin dx in her  71s   Heart disease Father    Atrial fibrillation Father    Heart disease Brother    Heart disease Brother    Colon cancer Maternal Aunt        dx in her 40s-60   Diabetes Paternal Aunt    Ovarian cancer Maternal  Grandmother        dx in her mid 34s   Cancer Maternal Grandmother        OVARIAN AND UTERINE   Uterine cancer Maternal Grandmother        dx in her early 23s   Stomach cancer Maternal Uncle        dx in his 60s-80s   Stroke Maternal Uncle    Leukemia Paternal Uncle    Past Surgical History:  Procedure Laterality Date   APPENDECTOMY  1969   BUNIONECTOMY Bilateral    JOINT REPLACEMENT     KNEE ARTHROSCOPY Left 11/2013   Precancerous lesion excised     "arm"   TONSILLECTOMY     TOTAL ABDOMINAL HYSTERECTOMY  1990   TAH BSO   TOTAL HIP ARTHROPLASTY Left 07/20/2018   TOTAL HIP ARTHROPLASTY Left 07/20/2018   Procedure: LEFT TOTAL HIP ARTHROPLASTY ANTERIOR APPROACH;  Surgeon: Mcarthur Rossetti, MD;  Location: [place];  Service: Orthopedics;  Laterality: Left;   TOTAL KNEE ARTHROPLASTY Right 2012   TOTAL KNEE ARTHROPLASTY Left 04/03/2015   Procedure: TOTAL KNEE ARTHROPLASTY;  Surgeon: Garald Balding, MD;  Location: [place];  Service: Orthopedics;  Laterality: Left;   Social History   Occupational History   Not on file  Tobacco Use   Smoking status: Never   Smokeless tobacco: Never  Vaping Use   Vaping Use: Never used  Substance and Sexual Activity   Alcohol use: Yes    Alcohol/week: 1.0 - 2.0 standard drink    Types: 1 - 2 Standard drinks or equivalent per week   Drug use: No   Sexual activity: Not Currently    Partners: Male    Birth control/protection: Surgical, Post-menopausal

## 2021-07-17 ENCOUNTER — Encounter: Payer: Self-pay | Admitting: Podiatry

## 2021-07-17 ENCOUNTER — Other Ambulatory Visit: Payer: Self-pay

## 2021-07-17 ENCOUNTER — Ambulatory Visit: Payer: PPO | Admitting: Podiatry

## 2021-07-17 DIAGNOSIS — L603 Nail dystrophy: Secondary | ICD-10-CM | POA: Diagnosis not present

## 2021-07-17 DIAGNOSIS — B351 Tinea unguium: Secondary | ICD-10-CM

## 2021-07-17 DIAGNOSIS — L601 Onycholysis: Secondary | ICD-10-CM | POA: Diagnosis not present

## 2021-07-17 DIAGNOSIS — L814 Other melanin hyperpigmentation: Secondary | ICD-10-CM | POA: Diagnosis not present

## 2021-07-17 NOTE — Progress Notes (Signed)
  Subjective:  Patient ID: Sharon [name], female    DOB: 02/11/1951,  MRN: [id number]  Chief Complaint  Patient presents with   Toe Pain     NP // Left foot Great toe     70 y.o. female presents with the above complaint. History confirmed with patient.  She had a dark streak in the left hallux nail for about a year.  Her dermatologist concerned and recommended she be evaluated for possible biopsy.  Her right third toenail is very thick and causes pressure on the skin and cause calluses as well.  Objective:  Physical Exam: warm, good capillary refill, no trophic changes or ulcerative lesions, normal DP and PT pulses, and normal sensory exam.  Yellow discoloration of multiple toenails including the right third and left hallux and right hallux.  The left hallux nail has a longitudinal brown streak in the nail, no Hutchinson sign Assessment:   1. Onychomycosis      Plan:  Patient was evaluated and treated and all questions answered.  Suspect is likely onychomycosis I took a sample of the nail and we will send this for culture and biopsy.  We will discuss her treatment options once I have the results back.  She indicated she would be hesitant to take an oral medication for prolonged amounts of time which I understand and we will consider a topical prescription treatment if it is positive for nail fungus.  Alternative etiology includes longitudinal and Anexsia, nothing was concerning for any neoplasm.  Return if symptoms worsen or fail to improve.

## 2021-08-16 DIAGNOSIS — L570 Actinic keratosis: Secondary | ICD-10-CM | POA: Diagnosis not present

## 2021-08-30 DIAGNOSIS — E663 Overweight: Secondary | ICD-10-CM | POA: Diagnosis not present

## 2021-08-30 DIAGNOSIS — E785 Hyperlipidemia, unspecified: Secondary | ICD-10-CM | POA: Diagnosis not present

## 2021-08-30 DIAGNOSIS — Z6826 Body mass index (BMI) 26.0-26.9, adult: Secondary | ICD-10-CM | POA: Diagnosis not present

## 2021-08-30 DIAGNOSIS — I10 Essential (primary) hypertension: Secondary | ICD-10-CM | POA: Diagnosis not present

## 2021-10-02 DIAGNOSIS — Z1231 Encounter for screening mammogram for malignant neoplasm of breast: Secondary | ICD-10-CM | POA: Diagnosis not present

## 2021-10-07 DIAGNOSIS — D225 Melanocytic nevi of trunk: Secondary | ICD-10-CM | POA: Diagnosis not present

## 2021-10-07 DIAGNOSIS — D2371 Other benign neoplasm of skin of right lower limb, including hip: Secondary | ICD-10-CM | POA: Diagnosis not present

## 2021-10-07 DIAGNOSIS — B351 Tinea unguium: Secondary | ICD-10-CM | POA: Diagnosis not present

## 2021-10-07 DIAGNOSIS — D1801 Hemangioma of skin and subcutaneous tissue: Secondary | ICD-10-CM | POA: Diagnosis not present

## 2021-10-07 DIAGNOSIS — L821 Other seborrheic keratosis: Secondary | ICD-10-CM | POA: Diagnosis not present

## 2022-01-28 ENCOUNTER — Ambulatory Visit (INDEPENDENT_AMBULATORY_CARE_PROVIDER_SITE_OTHER): Payer: PPO | Admitting: Obstetrics and Gynecology

## 2022-01-28 ENCOUNTER — Encounter: Payer: Self-pay | Admitting: Obstetrics and Gynecology

## 2022-01-28 VITALS — BP 132/70 | HR 73 | Ht 65.0 in | Wt 168.2 lb

## 2022-01-28 DIAGNOSIS — Z01419 Encounter for gynecological examination (general) (routine) without abnormal findings: Secondary | ICD-10-CM

## 2022-01-28 DIAGNOSIS — K7689 Other specified diseases of liver: Secondary | ICD-10-CM | POA: Insufficient documentation

## 2022-01-28 NOTE — Progress Notes (Signed)
71 y.o. G11P2002 Married White or Caucasian Not Hispanic or Latino female here for annual exam.   ?  ?She has a FH of ovarian and colon cancer. She has had negative BRCA testing and negative testing for Lynch. S/P TAH/BSO. ? ?No bowel or bladder issues. Not sexually active.  ? ?It has been a rough year, several friends have been sick, nephew died.  ? ?Patient's last menstrual period was 02/17/1989.          ?Sexually active: No.  ?The current method of family planning is post menopausal status.    ?Exercising: Yes.    Gym/ health club routine includes low impact aerobics. ?Smoker:  no ? ?Health Maintenance: ?Pap:   2013 Negative ?History of abnormal Pap:  no ?MMG:  10/02/21 Had done at [facility] report was requested.  ?BMD Done at [facility] will request, was told it was normal.  ?Colonoscopy: 07/21/14 Mild diverticulosis, f/u in 10 years. ?TDaP:  UTD with PCP  ?Gardasil: n/a ? ? reports that she has never smoked. She has never used smokeless tobacco. She reports current alcohol use of about 1.0 - 2.0 standard drink per week. She reports that she does not use drugs. Retired Pharmacist, hospital. She has 2 sons, one is local, no grandchildren.  ? ?Past Medical History:  ?Diagnosis Date  ? Cataract   ? Family history of adverse reaction to anesthesia   ? "mom did; don't know what happened" (07/20/2018)  ? Family history of colon cancer   ? Family history of ovarian cancer   ? Family history of stomach cancer   ? Family history of uterine cancer   ? Fibroid   ? Heart murmur   ? "tiny, tiny one" (07/20/2018)  ? High cholesterol   ? Hypertension   ? Migraine   ? "a few/year; last one was 07/19/2018" (07/20/2018)  ? Osteoarthritis   ? "knees, hips, hands, probably in my back" (07/20/2018)  ? Ringworm   ? Vitamin D deficiency   ? ? ?Past Surgical History:  ?Procedure Laterality Date  ? APPENDECTOMY  1969  ? BUNIONECTOMY Bilateral   ? EYE SURGERY Bilateral   ? JOINT REPLACEMENT    ? KNEE ARTHROSCOPY Left 11/2013  ? Precancerous lesion excised    ?  "arm"  ? TONSILLECTOMY    ? TOTAL ABDOMINAL HYSTERECTOMY  1990  ? TAH BSO  ? TOTAL HIP ARTHROPLASTY Left 07/20/2018  ? TOTAL HIP ARTHROPLASTY Left 07/20/2018  ? Procedure: LEFT TOTAL HIP ARTHROPLASTY ANTERIOR APPROACH;  Surgeon: Mcarthur Rossetti, MD;  Location: [place];  Service: Orthopedics;  Laterality: Left;  ? TOTAL KNEE ARTHROPLASTY Right 2012  ? TOTAL KNEE ARTHROPLASTY Left 04/03/2015  ? Procedure: TOTAL KNEE ARTHROPLASTY;  Surgeon: Garald Balding, MD;  Location: [place];  Service: Orthopedics;  Laterality: Left;  ? ? ?Current Outpatient Medications  ?Medication Sig Dispense Refill  ? Calcium Carbonate-Vitamin D (CALCIUM 500 + D PO) Take 2 each by mouth daily. Gummies    ? Cholecalciferol (VITAMIN D3) 25 MCG (1000 UT) CAPS Take by mouth.    ? cycloSPORINE (RESTASIS) 0.05 % ophthalmic emulsion Place 1 drop into both eyes 2 (two) times daily.     ? diclofenac Sodium (VOLTAREN) 1 % GEL Apply 2 g topically 4 (four) times daily. 2 g 1  ? fluticasone (FLONASE) 50 MCG/ACT nasal spray Place 1 spray into both nostrils daily as needed for allergies (spring/fall).    ? hydrochlorothiazide (HYDRODIURIL) 25 MG tablet Take 25 mg by mouth daily.    ?  methocarbamol (ROBAXIN) 500 MG tablet TAKE 1 TABLET(500 MG) BY MOUTH TWICE DAILY AS NEEDED FOR MUSCLE SPASMS 30 tablet 1  ? SUMAtriptan (IMITREX) 100 MG tablet Take 100 mg by mouth as needed for migraine or headache.     ? verapamil (COVERA HS) 240 MG (CO) 24 hr tablet Take 240 mg by mouth 2 (two) times daily.     ? ?No current facility-administered medications for this visit.  ? ? ?Family History  ?Problem Relation Age of Onset  ? Ovarian cancer Mother 72  ? Heart disease Maternal Grandfather   ? Hypertension Paternal Grandmother   ? Ovarian cancer Cousin   ?     maternal first cousin dx in her 6s  ? Heart disease Father   ? Atrial fibrillation Father   ? Heart disease Brother   ? Heart disease Brother   ? Colon cancer Maternal Aunt   ?     dx in her 32s-60  ? Diabetes  Paternal Aunt   ? Ovarian cancer Maternal Grandmother   ?     dx in her mid 43s  ? Cancer Maternal Grandmother   ?     OVARIAN AND UTERINE  ? Uterine cancer Maternal Grandmother   ?     dx in her early 45s  ? Stomach cancer Maternal Uncle   ?     dx in his 69s-80s  ? Stroke Maternal Uncle   ? Leukemia Paternal Uncle   ? ? ?Review of Systems  ?All other systems reviewed and are negative. ? ?Exam:   ?BP 132/70   Pulse 73   Ht _0  (1.651 m)   Wt 168 lb 3.2 oz (76.3 kg)   LMP 02/17/1989   SpO2 99%   BMI 27.99 kg/m?   Weight change: _1 @ Height:   Height: _2  (165.1 cm)  ?Ht Readings from Last 3 Encounters:  ?01/28/22 _3  (1.651 m)  ?05/14/21 _4  (1.676 m)  ?04/09/21 _5  (1.676 m)  ? ? ?General appearance: alert, cooperative and appears stated age ?Head: Normocephalic, without obvious abnormality, atraumatic ?Neck: no adenopathy, supple, symmetrical, trachea midline and thyroid normal to inspection and palpation ?Lungs: clear to auscultation bilaterally ?Cardiovascular: regular rate and rhythm ?Breasts: normal appearance, no masses or tenderness ?Abdomen: soft, non-tender; non distended,  no masses,  no organomegaly ?Extremities: extremities normal, atraumatic, no cyanosis or edema ?Skin: Skin color, texture, turgor normal. No rashes or lesions ?Lymph nodes: Cervical, supraclavicular, and axillary nodes normal. ?No abnormal inguinal nodes palpated ?Neurologic: Grossly normal ? ? ?Pelvic: External genitalia:  no lesions ?             Urethra:  normal appearing urethra with no masses, tenderness or lesions ?             Bartholins and Skenes: normal    ?             Vagina: normal appearing vagina with normal color and discharge, no lesions ?             Cervix: absent ?              ?Bimanual Exam:  Uterus:  uterus absent ?             Adnexa: no mass, fullness, tenderness ?              Rectovaginal: Confirms ?              Anus:  normal sphincter tone,  no lesions ? ?Gae Dry chaperoned for  the exam. ? ?1. Encounter for gynecological examination without abnormal finding ?No pap needed ?Discussed breast self exam ?Discussed calcium and vit D intake ?Mammogram, colonoscopy, labs and dexa are UTD ? ? ? ?

## 2022-01-30 ENCOUNTER — Encounter: Payer: Self-pay | Admitting: Obstetrics and Gynecology

## 2022-02-27 DIAGNOSIS — R7989 Other specified abnormal findings of blood chemistry: Secondary | ICD-10-CM | POA: Diagnosis not present

## 2022-02-27 DIAGNOSIS — E785 Hyperlipidemia, unspecified: Secondary | ICD-10-CM | POA: Diagnosis not present

## 2022-02-27 DIAGNOSIS — M159 Polyosteoarthritis, unspecified: Secondary | ICD-10-CM | POA: Diagnosis not present

## 2022-02-27 DIAGNOSIS — I10 Essential (primary) hypertension: Secondary | ICD-10-CM | POA: Diagnosis not present

## 2022-02-27 DIAGNOSIS — R0789 Other chest pain: Secondary | ICD-10-CM | POA: Diagnosis not present

## 2022-02-27 DIAGNOSIS — Z0000 Encounter for general adult medical examination without abnormal findings: Secondary | ICD-10-CM | POA: Diagnosis not present

## 2022-02-27 DIAGNOSIS — L604 Beau's lines: Secondary | ICD-10-CM | POA: Diagnosis not present

## 2022-03-06 DIAGNOSIS — I10 Essential (primary) hypertension: Secondary | ICD-10-CM | POA: Diagnosis not present

## 2022-03-06 DIAGNOSIS — G4486 Cervicogenic headache: Secondary | ICD-10-CM | POA: Diagnosis not present

## 2022-03-06 DIAGNOSIS — R7301 Impaired fasting glucose: Secondary | ICD-10-CM | POA: Diagnosis not present

## 2022-03-06 DIAGNOSIS — G43909 Migraine, unspecified, not intractable, without status migrainosus: Secondary | ICD-10-CM | POA: Diagnosis not present

## 2022-03-06 DIAGNOSIS — Z0000 Encounter for general adult medical examination without abnormal findings: Secondary | ICD-10-CM | POA: Diagnosis not present

## 2022-03-06 DIAGNOSIS — E785 Hyperlipidemia, unspecified: Secondary | ICD-10-CM | POA: Diagnosis not present

## 2022-03-06 DIAGNOSIS — Z1331 Encounter for screening for depression: Secondary | ICD-10-CM | POA: Diagnosis not present

## 2022-03-06 DIAGNOSIS — E663 Overweight: Secondary | ICD-10-CM | POA: Diagnosis not present

## 2022-03-06 DIAGNOSIS — Z1339 Encounter for screening examination for other mental health and behavioral disorders: Secondary | ICD-10-CM | POA: Diagnosis not present

## 2022-03-06 DIAGNOSIS — M159 Polyosteoarthritis, unspecified: Secondary | ICD-10-CM | POA: Diagnosis not present

## 2022-03-06 DIAGNOSIS — Z6828 Body mass index (BMI) 28.0-28.9, adult: Secondary | ICD-10-CM | POA: Diagnosis not present

## 2022-03-06 DIAGNOSIS — K30 Functional dyspepsia: Secondary | ICD-10-CM | POA: Diagnosis not present

## 2022-04-04 DIAGNOSIS — M25511 Pain in right shoulder: Secondary | ICD-10-CM | POA: Diagnosis not present

## 2022-04-04 DIAGNOSIS — J399 Disease of upper respiratory tract, unspecified: Secondary | ICD-10-CM | POA: Diagnosis not present

## 2022-04-04 DIAGNOSIS — J40 Bronchitis, not specified as acute or chronic: Secondary | ICD-10-CM | POA: Diagnosis not present

## 2022-04-04 DIAGNOSIS — J029 Acute pharyngitis, unspecified: Secondary | ICD-10-CM | POA: Diagnosis not present

## 2022-04-04 DIAGNOSIS — R058 Other specified cough: Secondary | ICD-10-CM | POA: Diagnosis not present

## 2022-04-04 DIAGNOSIS — G8929 Other chronic pain: Secondary | ICD-10-CM | POA: Diagnosis not present

## 2022-04-04 DIAGNOSIS — R0981 Nasal congestion: Secondary | ICD-10-CM | POA: Diagnosis not present

## 2022-04-04 DIAGNOSIS — Z1152 Encounter for screening for COVID-19: Secondary | ICD-10-CM | POA: Diagnosis not present

## 2022-05-16 DIAGNOSIS — R0789 Other chest pain: Secondary | ICD-10-CM | POA: Diagnosis not present

## 2022-05-16 DIAGNOSIS — M5090 Cervical disc disorder, unspecified, unspecified cervical region: Secondary | ICD-10-CM | POA: Diagnosis not present

## 2022-05-16 DIAGNOSIS — M25519 Pain in unspecified shoulder: Secondary | ICD-10-CM | POA: Diagnosis not present

## 2022-05-16 DIAGNOSIS — Z636 Dependent relative needing care at home: Secondary | ICD-10-CM | POA: Diagnosis not present

## 2022-05-16 DIAGNOSIS — R0781 Pleurodynia: Secondary | ICD-10-CM | POA: Diagnosis not present

## 2022-05-30 DIAGNOSIS — M7541 Impingement syndrome of right shoulder: Secondary | ICD-10-CM | POA: Diagnosis not present

## 2022-05-30 DIAGNOSIS — M25511 Pain in right shoulder: Secondary | ICD-10-CM | POA: Diagnosis not present

## 2022-06-03 DIAGNOSIS — M7541 Impingement syndrome of right shoulder: Secondary | ICD-10-CM | POA: Diagnosis not present

## 2022-06-05 DIAGNOSIS — M7541 Impingement syndrome of right shoulder: Secondary | ICD-10-CM | POA: Diagnosis not present

## 2022-06-10 DIAGNOSIS — M7541 Impingement syndrome of right shoulder: Secondary | ICD-10-CM | POA: Diagnosis not present

## 2022-06-12 DIAGNOSIS — M7541 Impingement syndrome of right shoulder: Secondary | ICD-10-CM | POA: Diagnosis not present

## 2022-06-17 DIAGNOSIS — M7541 Impingement syndrome of right shoulder: Secondary | ICD-10-CM | POA: Diagnosis not present

## 2022-06-19 DIAGNOSIS — M7541 Impingement syndrome of right shoulder: Secondary | ICD-10-CM | POA: Diagnosis not present

## 2022-06-24 DIAGNOSIS — M7541 Impingement syndrome of right shoulder: Secondary | ICD-10-CM | POA: Diagnosis not present

## 2022-06-25 DIAGNOSIS — H16223 Keratoconjunctivitis sicca, not specified as Sjogren's, bilateral: Secondary | ICD-10-CM | POA: Diagnosis not present

## 2022-06-25 DIAGNOSIS — H43813 Vitreous degeneration, bilateral: Secondary | ICD-10-CM | POA: Diagnosis not present

## 2022-06-25 DIAGNOSIS — Z961 Presence of intraocular lens: Secondary | ICD-10-CM | POA: Diagnosis not present

## 2022-06-25 DIAGNOSIS — H26492 Other secondary cataract, left eye: Secondary | ICD-10-CM | POA: Diagnosis not present

## 2022-06-26 ENCOUNTER — Ambulatory Visit: Payer: PPO | Admitting: Orthopaedic Surgery

## 2022-06-26 ENCOUNTER — Ambulatory Visit (INDEPENDENT_AMBULATORY_CARE_PROVIDER_SITE_OTHER): Payer: PPO

## 2022-06-26 ENCOUNTER — Encounter: Payer: Self-pay | Admitting: Orthopaedic Surgery

## 2022-06-26 DIAGNOSIS — M25572 Pain in left ankle and joints of left foot: Secondary | ICD-10-CM

## 2022-06-26 DIAGNOSIS — M25551 Pain in right hip: Secondary | ICD-10-CM | POA: Diagnosis not present

## 2022-06-26 DIAGNOSIS — M7541 Impingement syndrome of right shoulder: Secondary | ICD-10-CM | POA: Diagnosis not present

## 2022-06-26 NOTE — Progress Notes (Signed)
Office Visit Note   Patient: Sharon [name]           Date of Birth: 1951/07/28           MRN: [id number] Visit Date: 06/26/2022              Requested by: Michael Boston, MD [id number] [place],  [place] [id number] PCP: Michael Boston, MD   Assessment & Plan: Visit Diagnoses:  1. Pain in left ankle and joints of left foot   2. Pain in right hip     Plan: Sharon [name] is experiencing some pain in the area of her right hip more laterally and posteriorly.  X-rays demonstrate some mild degenerative changes of the right hip.  She has had a prior left total hip replacement.  She is experiencing a little back pain and does have some degenerative changes.  I think a course of physical therapy would be helpful.  Most of her pain seems to originate from the lumbar spine rather than her hip.  If she continues to have discomfort it may be worthwhile to consider injecting the the right hip joint.  Also experiencing some discomfort in her left foot.  She has had prior bunion surgery.  Wounds of healed nicely.  There is no evidence of hallux rigidus.  Good capillary refill to the toe.  There is some early overlap of the second on the great toe but no callus formation or redness.  No pain in the plantar aspect of her foot.  She certainly has some osteophyte formation dorsally in the midfoot.  She does pronate to some extent but wears good comfortable shoes with an arch support.  I think her problem is related to the midfoot arthritis and would have her continue with a good comfortable shoe and the arch support.  She could try Voltaren gel.  We could always try local injections if it continues to be a problem  Follow-Up Instructions: Return if symptoms worsen or fail to improve.   Orders:  Orders Placed This Encounter  Procedures   XR HIP UNILAT W OR W/O PELVIS 2-3 VIEWS RIGHT   XR Foot Complete Left   Ambulatory referral to Physical Therapy   No orders of the defined types were placed in this  encounter.     Procedures: No procedures performed   Clinical Data: No additional findings.   Subjective: Chief Complaint  Patient presents with   Right Hip - Follow-up   Left Foot - Follow-up   Patient presents today for her right hip and left foot pain. She states that with her right hip she has been having ongoing pains. She has had a previous Left total hip arthroplasty preformed by Dr. Ninfa Linden a few years ago, however denies having any previous surgeries in her right hip. Patient states that with her left foot she has had a previous bunionectomy surgery preformed by Dr. Durward Fortes and she has been having increased pain in the arch of her left foot and at the surgical site.  No injury or trauma.  She is wearing comfortable shoes with an arch support provided by Dr. Eden Lathe years ago  Review of Systems   Objective: Vital Signs: LMP 02/17/1989   Physical Exam Constitutional:      Appearance: She is well-developed.  Eyes:     Pupils: Pupils are equal, round, and reactive to light.  Pulmonary:     Effort: Pulmonary effort is normal.  Skin:    General: Skin is  warm and dry.  Neurological:     Mental Status: She is alert and oriented to person, place, and time.  Psychiatric:        Behavior: Behavior normal.     Ortho Exam awake alert and oriented x3.  Comfortable sitting.  Straight leg raise negative.  Minimal discomfort with internal/external rotation of her right hip but no loss of motion.  Had some "achiness" along the posterior aspect of her buttock and even superior to the greater trochanter right hip.  No masses.  No percussible tenderness of lumbar spine.  She is not experiencing any radicular discomfort.  Left foot with a well-healed bunion surgery.  There is no evidence of recurrence of the bunion.  There is no callus on the plantar aspect of her foot or her toe good capillary refill and normal sensation.  There is very early overlap of the second on the great toe  but no callus formation or pain.  Palpable osteophytes in the midfoot dorsally and even at the base of the great toe tarsometatarsal articulation.  No plantar pain.  No heel pain.  No Achilles pain.  Does have mild mild to moderate pronation with weightbearing posterior tib tendon appears to be intact  Specialty Comments:  No specialty comments available.  Imaging: XR Foot Complete Left  Result Date: 06/26/2022 Films of the left foot were obtained in several projections.  There is been a prior bunionectomy with good alignment of the great toe.  No recurrence of the bunion.  Very minimal degenerative changes about the first metatarsal phalangeal joint.  Considerable arthritis at the base of the great toe at the tarsal metatarsal articulation and even in the midfoot  XR HIP UNILAT W OR W/O PELVIS 2-3 VIEWS RIGHT  Result Date: 06/26/2022 AP pelvis and lateral of the right hip were obtained.  There are some degenerative changes about the right hip with superior and inferior spurring-minimal.  Joint space appears to be well-maintained.  Well-positioned left total hip replacement    PMFS History: Patient Active Problem List   Diagnosis Date Noted   Liver cyst 01/28/2022   Pain in left shoulder 01/10/2021   Neck pain, acute 04/11/2020   Asymptomatic varicose veins of bilateral lower extremities 02/09/2020   Functional dyspepsia 11/22/2019   Aortic valve disorder 11/17/2019   Family history of cancer 11/17/2019   Hyperlipidemia 11/17/2019   Lactose intolerance 11/17/2019   Migraine, unspecified, not intractable, without status migrainosus 11/17/2019   Pain in right ankle and joints of right foot 07/26/2019   Status post total replacement of left hip 07/20/2018   Unilateral primary osteoarthritis, left hip 05/25/2018   Genetic testing 11/14/2016   Family history of ovarian cancer    Family history of colon cancer    Family history of uterine cancer    Family history of stomach cancer     Osteoarthritis of left knee 04/03/2015   S/P total knee replacement using cement 04/03/2015   Gait abnormality 12/22/2012   Swelling of ankle 12/22/2012   Hypertension    Headache(784.0)    Arthritis    Vitamin D deficiency    Past Medical History:  Diagnosis Date   Cataract    Family history of adverse reaction to anesthesia    "mom did; don't know what happened" (07/20/2018)   Family history of colon cancer    Family history of ovarian cancer    Family history of stomach cancer    Family history of uterine cancer  Fibroid    Heart murmur    "tiny, tiny one" (07/20/2018)   High cholesterol    Hypertension    Migraine    "a few/year; last one was 07/19/2018" (07/20/2018)   Osteoarthritis    "knees, hips, hands, probably in my back" (07/20/2018)   Ringworm    Vitamin D deficiency     Family History  Problem Relation Age of Onset   Ovarian cancer Mother 67   Heart disease Maternal Grandfather    Hypertension Paternal Grandmother    Ovarian cancer Cousin        maternal first cousin dx in her 47s   Heart disease Father    Atrial fibrillation Father    Heart disease Brother    Heart disease Brother    Colon cancer Maternal Aunt        dx in her 59s-60   Diabetes Paternal Aunt    Ovarian cancer Maternal Grandmother        dx in her mid 52s   Cancer Maternal Grandmother        OVARIAN AND UTERINE   Uterine cancer Maternal Grandmother        dx in her early 66s   Stomach cancer Maternal Uncle        dx in his 29s-80s   Stroke Maternal Uncle    Leukemia Paternal Uncle     Past Surgical History:  Procedure Laterality Date   APPENDECTOMY  1969   BUNIONECTOMY Bilateral    EYE SURGERY Bilateral    JOINT REPLACEMENT     KNEE ARTHROSCOPY Left 11/2013   Precancerous lesion excised     "arm"   TONSILLECTOMY     TOTAL ABDOMINAL HYSTERECTOMY  1990   TAH BSO   TOTAL HIP ARTHROPLASTY Left 07/20/2018   TOTAL HIP ARTHROPLASTY Left 07/20/2018   Procedure: LEFT TOTAL HIP  ARTHROPLASTY ANTERIOR APPROACH;  Surgeon: Mcarthur Rossetti, MD;  Location: [place];  Service: Orthopedics;  Laterality: Left;   TOTAL KNEE ARTHROPLASTY Right 2012   TOTAL KNEE ARTHROPLASTY Left 04/03/2015   Procedure: TOTAL KNEE ARTHROPLASTY;  Surgeon: Garald Balding, MD;  Location: [place];  Service: Orthopedics;  Laterality: Left;   Social History   Occupational History   Not on file  Tobacco Use   Smoking status: Never   Smokeless tobacco: Never  Vaping Use   Vaping Use: Never used  Substance and Sexual Activity   Alcohol use: Yes    Alcohol/week: 1.0 - 2.0 standard drink of alcohol    Types: 1 - 2 Standard drinks or equivalent per week   Drug use: No   Sexual activity: Not Currently    Partners: Male    Birth control/protection: Surgical, Post-menopausal

## 2022-07-01 ENCOUNTER — Telehealth: Payer: Self-pay | Admitting: Orthopaedic Surgery

## 2022-07-01 NOTE — Telephone Encounter (Signed)
Pt called stating she seen Dr Durward Fortes and he recommended for her to start physical therapy. Pt states her symptoms are worse and need to know should she still start therapy of do another option. Please call pt at [phone].

## 2022-07-03 ENCOUNTER — Ambulatory Visit: Payer: PPO | Admitting: Rehabilitative and Restorative Service Providers"

## 2022-07-03 ENCOUNTER — Ambulatory Visit: Payer: PPO | Admitting: Physical Therapy

## 2022-07-03 DIAGNOSIS — M7541 Impingement syndrome of right shoulder: Secondary | ICD-10-CM | POA: Diagnosis not present

## 2022-07-04 ENCOUNTER — Other Ambulatory Visit: Payer: Self-pay

## 2022-07-04 ENCOUNTER — Encounter: Payer: Self-pay | Admitting: Physical Therapy

## 2022-07-04 ENCOUNTER — Ambulatory Visit: Payer: PPO | Admitting: Physical Therapy

## 2022-07-04 DIAGNOSIS — M5416 Radiculopathy, lumbar region: Secondary | ICD-10-CM | POA: Diagnosis not present

## 2022-07-04 DIAGNOSIS — M25551 Pain in right hip: Secondary | ICD-10-CM

## 2022-07-04 DIAGNOSIS — M6281 Muscle weakness (generalized): Secondary | ICD-10-CM | POA: Diagnosis not present

## 2022-07-04 NOTE — Therapy (Signed)
OUTPATIENT PHYSICAL THERAPY THORACOLUMBAR EVALUATION   Patient Name: Sharon [name] MRN: [id number] DOB:04/25/1951, 71 y.o., female Today's Date: 07/04/2022   PT End of Session - 07/04/22 0902     Visit Number 1    Number of Visits 12    Date for PT Re-Evaluation 08/15/22    Authorization Type HTA $15 copay    Progress Note Due on Visit 10    PT Start Time 0811    PT Stop Time 0859    PT Time Calculation (min) 48 min    Activity Tolerance Patient tolerated treatment well    Behavior During Therapy [facility] for tasks assessed/performed             Past Medical History:  Diagnosis Date   Cataract    Family history of adverse reaction to anesthesia    "mom did; don't know what happened" (07/20/2018)   Family history of colon cancer    Family history of ovarian cancer    Family history of stomach cancer    Family history of uterine cancer    Fibroid    Heart murmur    "tiny, tiny one" (07/20/2018)   High cholesterol    Hypertension    Migraine    "a few/year; last one was 07/19/2018" (07/20/2018)   Osteoarthritis    "knees, hips, hands, probably in my back" (07/20/2018)   Ringworm    Vitamin D deficiency    Past Surgical History:  Procedure Laterality Date   APPENDECTOMY  1969   BUNIONECTOMY Bilateral    EYE SURGERY Bilateral    JOINT REPLACEMENT     KNEE ARTHROSCOPY Left 11/2013   Precancerous lesion excised     "arm"   TONSILLECTOMY     TOTAL ABDOMINAL HYSTERECTOMY  1990   TAH BSO   TOTAL HIP ARTHROPLASTY Left 07/20/2018   TOTAL HIP ARTHROPLASTY Left 07/20/2018   Procedure: LEFT TOTAL HIP ARTHROPLASTY ANTERIOR APPROACH;  Surgeon: Mcarthur Rossetti, MD;  Location: [place];  Service: Orthopedics;  Laterality: Left;   TOTAL KNEE ARTHROPLASTY Right 2012   TOTAL KNEE ARTHROPLASTY Left 04/03/2015   Procedure: TOTAL KNEE ARTHROPLASTY;  Surgeon: Garald Balding, MD;  Location: [place];  Service: Orthopedics;  Laterality: Left;   Patient Active Problem List    Diagnosis Date Noted   Liver cyst 01/28/2022   Pain in left shoulder 01/10/2021   Neck pain, acute 04/11/2020   Asymptomatic varicose veins of bilateral lower extremities 02/09/2020   Functional dyspepsia 11/22/2019   Aortic valve disorder 11/17/2019   Family history of cancer 11/17/2019   Hyperlipidemia 11/17/2019   Lactose intolerance 11/17/2019   Migraine, unspecified, not intractable, without status migrainosus 11/17/2019   Pain in right ankle and joints of right foot 07/26/2019   Status post total replacement of left hip 07/20/2018   Unilateral primary osteoarthritis, left hip 05/25/2018   Genetic testing 11/14/2016   Family history of ovarian cancer    Family history of colon cancer    Family history of uterine cancer    Family history of stomach cancer    Osteoarthritis of left knee 04/03/2015   S/P total knee replacement using cement 04/03/2015   Gait abnormality 12/22/2012   Swelling of ankle 12/22/2012   Hypertension    Headache(784.0)    Arthritis    Vitamin D deficiency     PCP: Michael Boston, MD   REFERRING PROVIDER: Garald Balding, MD   REFERRING DIAG: [phone] (ICD-10-CM) - Pain in right hip, lower  back pain   Rationale for Evaluation and Treatment Rehabilitation  THERAPY DIAG:  Radiculopathy, lumbar region - Plan: PT plan of care cert/re-cert  Pain in right hip - Plan: PT plan of care cert/re-cert  Muscle weakness (generalized) - Plan: PT plan of care cert/re-cert  ONSET DATE: 2 weeks ago  SUBJECTIVE:                                                                                                                                                                                           SUBJECTIVE STATEMENT: Pt is a 71 y/o female who presents to OPPT for Rt hip pain, possibly coming from back as well.  She reports about 1 week ago she was doing some stretches, then had increased pain and symptoms that evening making it difficult to walk.  She does  report some radicular symptoms as well.    PERTINENT HISTORY:  Migraines, Lt THA, Lt/Rt TKA, HTN, OA  PAIN:  Are you having pain? Yes: NPRS scale: 2-3 currently, up to 6-7, at best 0/10 Pain location: back, Rt hip, leg (to Rt ankle) Pain description: aching, sharp Aggravating factors: certain stretches, walking Relieving factors: possible flexion   PRECAUTIONS: None  WEIGHT BEARING RESTRICTIONS No  FALLS:  Has patient fallen in last 6 months? 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[employer])  PLOF: Leisure: pool walking/running 3x/wk, reading, crafting  PATIENT GOALS improve pain   OBJECTIVE:   DIAGNOSTIC FINDINGS:  X-rays: There are some degenerative changes about the right hip with superior and inferior spurring-minimal   PATIENT SURVEYS:  07/04/22: FOTO 43 (predicted 66)  SCREENING FOR RED FLAGS: Bowel or bladder incontinence: No Spinal tumors: No  COGNITION: Overall cognitive status: Within functional limits for tasks assessed     SENSATION: WFL  MUSCLE LENGTH: No significant tightness noted  POSTURE: rounded shoulders and forward head  PALPATION: Tenderness T12-L5 CPA mobs, tenderness bil QL, Lt > Rt SIJ tenderness; Rt hip flexor tightness with trigger points  LUMBAR ROM:   Active  A/PROM  eval  Flexion WNL  Extension Limited 25% with increase in symptoms  Right lateral flexion WNL  Left lateral flexion WNL  Right rotation WNL  Left rotation WNL   (Blank rows = not tested)  LOWER EXTREMITY ROM:     07/04/22: grossly WNL for functional activities assessed except Rt hip ext due to hip flexor tightness  LOWER EXTREMITY MMT:    MMT Right eval Left  eval  Hip flexion 3+/5 4/5  Hip extension    Hip abduction    Hip adduction    Hip internal  rotation    Hip external rotation    Knee flexion    Knee extension 5/5 5/5  Ankle dorsiflexion    Ankle plantarflexion    Ankle inversion    Ankle eversion     (Blank rows = not tested)  LUMBAR SPECIAL TESTS:  07/04/22 Slump test: Positive (mildly positive on Rt with increase in back pain, decrease with reduction in neural tension)   GAIT: Independent  TODAY'S TREATMENT  07/04/22 See HEP - performed trial reps PRN for comprehension, with min to mod cues for technique   PATIENT EDUCATION:  Education details: HEP Person educated: Patient Education method: Consulting civil engineer, Media planner, and Handouts Education comprehension: verbalized understanding, returned demonstration, and needs further education   HOME EXERCISE PROGRAM: Access Code: [email or web address] URL: [email or web address] Date: 07/04/2022 Prepared by: Faustino Congress  Exercises - Hooklying Single Knee to Chest  - 2 x daily - 7 x weekly - 1 sets - 3 reps - 30 sec hold - Supine Piriformis Stretch with Foot on Ground  - 2 x daily - 7 x weekly - 1 sets - 3 reps - 30 sec hold - Supine Posterior Pelvic Tilt  - 2 x daily - 7 x weekly - 1 sets - 10 reps - 5 sec hold - Supine Bridge  - 2 x daily - 7 x weekly - 1 sets - 10 reps - 5 sec hold - Slump Stretch  - 2 x daily - 7 x weekly - 1 sets - 10 reps - 5-10 sec hold - Standing Hip Hiking  - 2 x daily - 7 x weekly - 1 sets - 10 reps - 5-10 sec hold  ASSESSMENT:  CLINICAL IMPRESSION: Patient is a 71 y.o. female who was seen today for physical therapy evaluation and treatment for LBP with Rt hip pain and radiculopathy. She demonstrates decreased strength and flexibility, and active trigger points affecting functional mobility.  She will benefit from PT to address deficits listed.   OBJECTIVE IMPAIRMENTS decreased mobility, decreased strength, increased fascial restrictions, increased muscle spasms, impaired flexibility, postural dysfunction, and pain.   ACTIVITY  LIMITATIONS carrying, lifting, standing, squatting, sleeping, and locomotion level  PARTICIPATION LIMITATIONS: cleaning, laundry, and community activity  PERSONAL FACTORS 3+ comorbidities: Migraines, Lt THA, Lt/Rt TKA, HTN, OA  are also affecting patient's functional outcome.   REHAB POTENTIAL: Good  CLINICAL DECISION MAKING: Evolving/moderate complexity  EVALUATION COMPLEXITY: Moderate   GOALS: Goals reviewed with patient? Yes  SHORT TERM GOALS: Target date: 07/25/2022  Independent with initial HEP Goal status: INITIAL   LONG TERM GOALS: Target date: 08/15/2022  Independent with final HEP Goal status: INITIAL  2.  FOTO score improved to 66 Goal status: INITIAL  3.  Rt hi strength improved to 4/5 for improved mobility and function Goal status: INIITAL  4.  Report pain < 4/10 with increased activity and walking for improved function Goal status: INITIAL  5.  Demonstrate negative slump test for improved function and decreased neural tension Goal status: INITIAL     PLAN: PT FREQUENCY: 1-2x/week  PT DURATION: 6 weeks  PLANNED INTERVENTIONS: Therapeutic exercises, Therapeutic activity, Neuromuscular re-education, Patient/Family education, Self Care, Joint mobilization, Aquatic Therapy, Dry Needling, Electrical stimulation, Spinal manipulation, Spinal mobilization, Cryotherapy, Moist heat, Taping, Traction, Ionotophoresis '4mg'$ /ml Dexamethasone, Manual therapy, and Re-evaluation.  PLAN FOR NEXT SESSION: review HEP, needs hip flexor stretch, consider DN (  did not discuss today) to QL and hip flexors, hip/core strengthening in neutral (has flexion preference so limit extension for now)   Laureen Abrahams, PT, DPT 07/04/22 9:14 AM

## 2022-07-07 ENCOUNTER — Ambulatory Visit: Payer: PPO | Admitting: Rehabilitative and Restorative Service Providers"

## 2022-07-08 ENCOUNTER — Encounter: Payer: Self-pay | Admitting: Physical Therapy

## 2022-07-08 ENCOUNTER — Ambulatory Visit: Payer: PPO | Admitting: Physical Therapy

## 2022-07-08 DIAGNOSIS — M6281 Muscle weakness (generalized): Secondary | ICD-10-CM

## 2022-07-08 DIAGNOSIS — M5416 Radiculopathy, lumbar region: Secondary | ICD-10-CM | POA: Diagnosis not present

## 2022-07-08 DIAGNOSIS — M25551 Pain in right hip: Secondary | ICD-10-CM | POA: Diagnosis not present

## 2022-07-08 NOTE — Therapy (Signed)
OUTPATIENT PHYSICAL THERAPY TREATMENT NOTE   Patient Name: Sharon [name] MRN: [id number] DOB:1951/01/14, 71 y.o., female Today's Date: 07/08/2022  END OF SESSION:   PT End of Session - 07/08/22 1012     Visit Number 2    Number of Visits 12    Date for PT Re-Evaluation 08/15/22    Authorization Type HTA $15 copay    Progress Note Due on Visit 10    PT Start Time 1012    PT Stop Time 1100    PT Time Calculation (min) 48 min    Activity Tolerance Patient tolerated treatment well    Behavior During Therapy WFL for tasks assessed/performed             Past Medical History:  Diagnosis Date   Cataract    Family history of adverse reaction to anesthesia    "mom did; don't know what happened" (07/20/2018)   Family history of colon cancer    Family history of ovarian cancer    Family history of stomach cancer    Family history of uterine cancer    Fibroid    Heart murmur    "tiny, tiny one" (07/20/2018)   High cholesterol    Hypertension    Migraine    "a few/year; last one was 07/19/2018" (07/20/2018)   Osteoarthritis    "knees, hips, hands, probably in my back" (07/20/2018)   Ringworm    Vitamin D deficiency    Past Surgical History:  Procedure Laterality Date   APPENDECTOMY  1969   BUNIONECTOMY Bilateral    EYE SURGERY Bilateral    JOINT REPLACEMENT     KNEE ARTHROSCOPY Left 11/2013   Precancerous lesion excised     "arm"   TONSILLECTOMY     TOTAL ABDOMINAL HYSTERECTOMY  1990   TAH BSO   TOTAL HIP ARTHROPLASTY Left 07/20/2018   TOTAL HIP ARTHROPLASTY Left 07/20/2018   Procedure: LEFT TOTAL HIP ARTHROPLASTY ANTERIOR APPROACH;  Surgeon: Mcarthur Rossetti, MD;  Location: [place];  Service: Orthopedics;  Laterality: Left;   TOTAL KNEE ARTHROPLASTY Right 2012   TOTAL KNEE ARTHROPLASTY Left 04/03/2015   Procedure: TOTAL KNEE ARTHROPLASTY;  Surgeon: Garald Balding, MD;  Location: [place];  Service: Orthopedics;  Laterality: Left;   Patient Active Problem List    Diagnosis Date Noted   Liver cyst 01/28/2022   Pain in left shoulder 01/10/2021   Neck pain, acute 04/11/2020   Asymptomatic varicose veins of bilateral lower extremities 02/09/2020   Functional dyspepsia 11/22/2019   Aortic valve disorder 11/17/2019   Family history of cancer 11/17/2019   Hyperlipidemia 11/17/2019   Lactose intolerance 11/17/2019   Migraine, unspecified, not intractable, without status migrainosus 11/17/2019   Pain in right ankle and joints of right foot 07/26/2019   Status post total replacement of left hip 07/20/2018   Unilateral primary osteoarthritis, left hip 05/25/2018   Genetic testing 11/14/2016   Family history of ovarian cancer    Family history of colon cancer    Family history of uterine cancer    Family history of stomach cancer    Osteoarthritis of left knee 04/03/2015   S/P total knee replacement using cement 04/03/2015   Gait abnormality 12/22/2012   Swelling of ankle 12/22/2012   Hypertension    Headache(784.0)    Arthritis    Vitamin D deficiency      THERAPY DIAG:  Radiculopathy, lumbar region  Pain in right hip  Muscle weakness (generalized) PCP: Michael Boston, MD  REFERRING PROVIDER: Garald Balding, MD    REFERRING DIAG: [phone] (ICD-10-CM) - Pain in right hip, lower back pain    Rationale for Evaluation and Treatment Rehabilitation   ONSET DATE: 2 weeks ago   SUBJECTIVE:                                                                                                                                                                                            SUBJECTIVE STATEMENT: She says the pain is bad today, running all the way down her Rt leg, does not know how to describe it other than it hurts, denies that it feels like numbness/tingling/burning, but maybe some tingling in both her calves.   PERTINENT HISTORY:  Migraines, Lt THA, Lt/Rt TKA, HTN, OA   PAIN:  Are you having pain? Yes: NPRS scale: 2-3 currently, up  to 6-7, at best 0/10 Pain location: back, Rt hip, leg (to Rt ankle) Pain description: aching, sharp Aggravating factors: certain stretches, walking Relieving factors: possible flexion     PRECAUTIONS: None   WEIGHT BEARING RESTRICTIONS No   FALLS:  Has patient fallen in last 6 months? 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[employer])   PLOF: Leisure: pool walking/running 3x/wk, reading, crafting   PATIENT GOALS improve pain     OBJECTIVE:    DIAGNOSTIC FINDINGS:  X-rays: There are some degenerative changes about the right hip with superior and inferior spurring-minimal    PATIENT SURVEYS:  07/04/22: FOTO 43 (predicted 66)   SCREENING FOR RED FLAGS: Bowel or bladder incontinence: No Spinal tumors: No   COGNITION: Overall cognitive status: Within functional limits for tasks assessed                          SENSATION: WFL   MUSCLE LENGTH: No significant tightness noted   POSTURE: rounded shoulders and forward head   PALPATION: Tenderness T12-L5 CPA mobs, tenderness bil QL, Lt > Rt SIJ tenderness; Rt hip flexor tightness with trigger points   LUMBAR ROM:    Active  A/PROM  eval  Flexion WNL  Extension Limited 25% with increase in symptoms  Right lateral flexion WNL  Left lateral flexion WNL  Right rotation WNL  Left rotation WNL   (Blank rows = not tested)   LOWER EXTREMITY ROM:      07/04/22: grossly WNL for functional activities assessed except Rt hip ext due to  hip flexor tightness   LOWER EXTREMITY MMT:     MMT Right eval Left eval  Hip flexion 3+/5 4/5  Hip extension      Hip abduction      Hip adduction      Hip internal rotation      Hip external rotation      Knee flexion      Knee extension 5/5 5/5  Ankle dorsiflexion      Ankle  plantarflexion      Ankle inversion      Ankle eversion       (Blank rows = not tested)   LUMBAR SPECIAL TESTS:  07/04/22 Slump test: Positive (mildly positive on Rt with increase in back pain, decrease with reduction in neural tension)     GAIT: Independent   TODAY'S TREATMENT  07/08/22 -Nu step L6-5 X 8 min UE/LE -Seated lumbar flexion stretch 5 sec X10 -Seated slump stretch 3 sec X 10 bilat -Standing hip hike X 10 bilat -Standing marches X 10 bilat -Standing hip abd X 10 bilat -Standing hip extension X 10 bialt -Supine piriformis stretch 3X30 sec bilat -Supine bridges X10 -Supine hip flexor stretch leg off EOB 3X30 sec  -Manual therapy: Rt leg long axis distraction X 3 min  07/04/22 See HEP - performed trial reps PRN for comprehension, with min to mod cues for technique     PATIENT EDUCATION:  Education details: HEP Person educated: Patient Education method: Consulting civil engineer, Media planner, and Handouts Education comprehension: verbalized understanding, returned demonstration, and needs further education     HOME EXERCISE PROGRAM: Access Code: [email or web address] URL: [email or web address] Date: 07/08/2022 Prepared by: Elsie Ra  Exercises - Hooklying Single Knee to Chest  - 2 x daily - 7 x weekly - 1 sets - 3 reps - 30 sec hold - Supine Piriformis Stretch with Foot on Ground  - 2 x daily - 7 x weekly - 1 sets - 3 reps - 30 sec hold - Supine Posterior Pelvic Tilt  - 2 x daily - 7 x weekly - 1 sets - 10 reps - 5 sec hold - Supine Bridge  - 2 x daily - 7 x weekly - 1 sets - 10 reps - 5 sec hold - Slump Stretch  - 2 x daily - 7 x weekly - 1 sets - 10 reps - 5-10 sec hold - Standing Hip Hiking  - 2 x daily - 7 x weekly - 1 sets - 10 reps - 5-10 sec hold - Supine Quadriceps Stretch with Strap on Table  - 1 x daily - 6 x weekly - 2-3 reps - 30 hold - Standing Hip Extension with Counter Support  - 2 x daily - 6 x weekly - 1 sets - 10 reps - Standing Hip Abduction with  Counter Support  - 2 x daily - 6 x weekly - 1 sets - 10 reps - alternating marching  - 2 x daily - 6 x weekly - 1 sets - 10 reps   ASSESSMENT:   CLINICAL IMPRESSION: Session focused on HEP review with cues and demo for technique. She shows good return demonstration and understanding of HEP. She relays improvement today in hip hike exercise and bridge exercise. She was able to walk with a little less pain at the end of session. PT recommending to continue current POC.   OBJECTIVE IMPAIRMENTS decreased mobility, decreased strength, increased fascial restrictions, increased muscle spasms, impaired flexibility, postural dysfunction, and pain.    ACTIVITY LIMITATIONS carrying, lifting,  standing, squatting, sleeping, and locomotion level   PARTICIPATION LIMITATIONS: cleaning, laundry, and community activity   PERSONAL FACTORS 3+ comorbidities: Migraines, Lt THA, Lt/Rt TKA, HTN, OA  are also affecting patient's functional outcome.    REHAB POTENTIAL: Good   CLINICAL DECISION MAKING: Evolving/moderate complexity   EVALUATION COMPLEXITY: Moderate     GOALS: Goals reviewed with patient? Yes   SHORT TERM GOALS: Target date: 07/25/2022   Independent with initial HEP Goal status: INITIAL     LONG TERM GOALS: Target date: 08/15/2022   Independent with final HEP Goal status: INITIAL   2.  FOTO score improved to 66 Goal status: INITIAL   3.  Rt hi strength improved to 4/5 for improved mobility and function Goal status: INIITAL   4.  Report pain < 4/10 with increased activity and walking for improved function Goal status: INITIAL   5.  Demonstrate negative slump test for improved function and decreased neural tension Goal status: INITIAL         PLAN: PT FREQUENCY: 1-2x/week   PT DURATION: 6 weeks   PLANNED INTERVENTIONS: Therapeutic exercises, Therapeutic activity, Neuromuscular re-education, Patient/Family education, Self Care, Joint mobilization, Aquatic Therapy, Dry  Needling, Electrical stimulation, Spinal manipulation, Spinal mobilization, Cryotherapy, Moist heat, Taping, Traction, Ionotophoresis '4mg'$ /ml Dexamethasone, Manual therapy, and Re-evaluation.   PLAN FOR NEXT SESSION:  consider possible DN (did not discuss today) to QL and hip flexors, hip/core strengthening in neutral (has flexion preference so limit extension for now)   Debbe Odea, PT,DPT 07/08/2022, 11:19 AM

## 2022-07-11 ENCOUNTER — Encounter: Payer: Self-pay | Admitting: Physical Therapy

## 2022-07-11 ENCOUNTER — Ambulatory Visit: Payer: PPO | Admitting: Physical Therapy

## 2022-07-11 DIAGNOSIS — M6281 Muscle weakness (generalized): Secondary | ICD-10-CM | POA: Diagnosis not present

## 2022-07-11 DIAGNOSIS — M25551 Pain in right hip: Secondary | ICD-10-CM | POA: Diagnosis not present

## 2022-07-11 DIAGNOSIS — M5416 Radiculopathy, lumbar region: Secondary | ICD-10-CM | POA: Diagnosis not present

## 2022-07-11 NOTE — Therapy (Signed)
OUTPATIENT PHYSICAL THERAPY TREATMENT NOTE   Patient Name: Sharon [name] MRN: [id number] DOB:1951/06/23, 71 y.o., female Today's Date: 07/11/2022  END OF SESSION:   PT End of Session - 07/11/22 0850     Visit Number 3    Number of Visits 12    Date for PT Re-Evaluation 08/15/22    Authorization Type HTA $15 copay    Progress Note Due on Visit 10    PT Start Time 0845    PT Stop Time 0925    PT Time Calculation (min) 40 min    Activity Tolerance Patient tolerated treatment well    Behavior During Therapy [facility] for tasks assessed/performed              Past Medical History:  Diagnosis Date   Cataract    Family history of adverse reaction to anesthesia    "mom did; don't know what happened" (07/20/2018)   Family history of colon cancer    Family history of ovarian cancer    Family history of stomach cancer    Family history of uterine cancer    Fibroid    Heart murmur    "tiny, tiny one" (07/20/2018)   High cholesterol    Hypertension    Migraine    "a few/year; last one was 07/19/2018" (07/20/2018)   Osteoarthritis    "knees, hips, hands, probably in my back" (07/20/2018)   Ringworm    Vitamin D deficiency    Past Surgical History:  Procedure Laterality Date   APPENDECTOMY  1969   BUNIONECTOMY Bilateral    EYE SURGERY Bilateral    JOINT REPLACEMENT     KNEE ARTHROSCOPY Left 11/2013   Precancerous lesion excised     "arm"   TONSILLECTOMY     TOTAL ABDOMINAL HYSTERECTOMY  1990   TAH BSO   TOTAL HIP ARTHROPLASTY Left 07/20/2018   TOTAL HIP ARTHROPLASTY Left 07/20/2018   Procedure: LEFT TOTAL HIP ARTHROPLASTY ANTERIOR APPROACH;  Surgeon: Mcarthur Rossetti, MD;  Location: [address];  Service: Orthopedics;  Laterality: Left;   TOTAL KNEE ARTHROPLASTY Right 2012   TOTAL KNEE ARTHROPLASTY Left 04/03/2015   Procedure: TOTAL KNEE ARTHROPLASTY;  Surgeon: Garald Balding, MD;  Location: [place];  Service: Orthopedics;  Laterality: Left;   Patient Active Problem  List   Diagnosis Date Noted   Liver cyst 01/28/2022   Pain in left shoulder 01/10/2021   Neck pain, acute 04/11/2020   Asymptomatic varicose veins of bilateral lower extremities 02/09/2020   Functional dyspepsia 11/22/2019   Aortic valve disorder 11/17/2019   Family history of cancer 11/17/2019   Hyperlipidemia 11/17/2019   Lactose intolerance 11/17/2019   Migraine, unspecified, not intractable, without status migrainosus 11/17/2019   Pain in right ankle and joints of right foot 07/26/2019   Status post total replacement of left hip 07/20/2018   Unilateral primary osteoarthritis, left hip 05/25/2018   Genetic testing 11/14/2016   Family history of ovarian cancer    Family history of colon cancer    Family history of uterine cancer    Family history of stomach cancer    Osteoarthritis of left knee 04/03/2015   S/P total knee replacement using cement 04/03/2015   Gait abnormality 12/22/2012   Swelling of ankle 12/22/2012   Hypertension    Headache(784.0)    Arthritis    Vitamin D deficiency      THERAPY DIAG:  Radiculopathy, lumbar region  Pain in right hip  Muscle weakness (generalized)  PCP: Cristie Hem  Lemmie Evens, MD    REFERRING PROVIDER: Garald Balding, MD    REFERRING DIAG: [phone] (ICD-10-CM) - Pain in right hip, lower back pain    Rationale for Evaluation and Treatment Rehabilitation   ONSET DATE: 2 weeks ago   SUBJECTIVE:                                                                                                                                                                                            SUBJECTIVE STATEMENT: Having an okay day today, did the stretches only yesterday.  Slump stretch (slightly modified) helped.     PERTINENT HISTORY:  Migraines, Lt THA, Lt/Rt TKA, HTN, OA   PAIN:  Are you having pain? Yes: NPRS scale: 2-3 currently, up to 4-5, at best 0/10 Pain location: back, Rt hip, leg (to Rt ankle) Pain description: aching,  sharp Aggravating factors: certain stretches, walking Relieving factors: possible flexion     PRECAUTIONS: None   WEIGHT BEARING RESTRICTIONS No   FALLS:  Has patient fallen in last 6 months? 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[employer])   PLOF: Leisure: pool walking/running 3x/wk, reading, crafting   PATIENT GOALS improve pain     OBJECTIVE:    DIAGNOSTIC FINDINGS:  X-rays: There are some degenerative changes about the right hip with superior and inferior spurring-minimal    PATIENT SURVEYS:  07/04/22: FOTO 43 (predicted 66)   SCREENING FOR RED FLAGS: Bowel or bladder incontinence: No Spinal tumors: No   COGNITION: Overall cognitive status: Within functional limits for tasks assessed                          SENSATION: WFL   MUSCLE LENGTH: No significant tightness noted   POSTURE: rounded shoulders and forward head   PALPATION: Tenderness T12-L5 CPA mobs, tenderness bil QL, Lt > Rt SIJ tenderness; Rt hip flexor tightness with trigger points   LUMBAR ROM:    Active  A/PROM  eval  Flexion WNL  Extension Limited 25% with increase in symptoms  Right lateral flexion WNL  Left lateral flexion WNL  Right rotation WNL  Left rotation WNL   (Blank rows = not tested)   LOWER EXTREMITY ROM:      07/04/22: grossly WNL for functional activities assessed except Rt hip ext due to hip flexor tightness   LOWER EXTREMITY MMT:     MMT Right eval Left eval  Hip flexion 3+/5 4/5  Hip extension      Hip abduction      Hip adduction      Hip internal rotation      Hip external rotation      Knee flexion      Knee extension 5/5 5/5  Ankle dorsiflexion      Ankle plantarflexion      Ankle inversion      Ankle eversion       (Blank rows = not tested)   LUMBAR  SPECIAL TESTS:  07/04/22 Slump test: Positive (mildly positive on Rt with increase in back pain, decrease with reduction in neural tension)     GAIT: Independent   TODAY'S TREATMENT  07/11/22 TherEx: NuStep L5 x 8 min Standing hip abduction x 10 reps bil Standing hip extension x 10 reps bil Standing hip flexion x 10 reps Supine hip flexor/quad stretch 3x30 sec bil Long sitting slump stretch 10 x 3 sec   Rt lateral shift correction 10 x 10 sec hold Mini squats at counter x 10 reps  07/08/22 -Nu step L6-5 X 8 min UE/LE -Seated lumbar flexion stretch 5 sec X10 -Seated slump stretch 3 sec X 10 bilat -Standing hip hike X 10 bilat -Standing marches X 10 bilat -Standing hip abd X 10 bilat -Standing hip extension X 10 bialt -Supine piriformis stretch 3X30 sec bilat -Supine bridges X10 -Supine hip flexor stretch leg off EOB 3X30 sec  -Manual therapy: Rt leg long axis distraction X 3 min  07/04/22 See HEP - performed trial reps PRN for comprehension, with min to mod cues for technique     PATIENT EDUCATION:  Education details: HEP Person educated: Patient Education method: Consulting civil engineer, Media planner, and Handouts Education comprehension: verbalized understanding, returned demonstration, and needs further education     HOME EXERCISE PROGRAM: Access Code: [email or web address] URL: [email or web address] Date: 07/08/2022 Prepared by: Elsie Ra  Exercises - Hooklying Single Knee to Chest  - 2 x daily - 7 x weekly - 1 sets - 3 reps - 30 sec hold - Supine Piriformis Stretch with Foot on Ground  - 2 x daily - 7 x weekly - 1 sets - 3 reps - 30 sec hold - Supine Posterior Pelvic Tilt  - 2 x daily - 7 x weekly - 1 sets - 10 reps - 5 sec hold - Supine Bridge  - 2 x daily - 7 x weekly - 1 sets - 10 reps - 5 sec hold - Slump Stretch  - 2 x daily - 7 x weekly - 1 sets - 10 reps - 5-10 sec hold - Standing Hip Hiking  - 2 x daily - 7 x weekly - 1 sets - 10 reps - 5-10 sec hold - Supine  Quadriceps Stretch with Strap on Table  - 1 x daily - 6 x weekly - 2-3 reps - 30 hold - Standing Hip Extension with Counter Support  - 2 x daily - 6 x weekly - 1 sets - 10 reps - Standing Hip Abduction with Counter Support  - 2 x daily - 6 x weekly - 1 sets - 10 reps - alternating marching  - 2 x daily - 6 x weekly - 1 sets - 10 reps   ASSESSMENT:   CLINICAL IMPRESSION: Pt tolerated session well today without increase in pain, and reports reduction in symptoms overall.  Will continue to benefit from PT to maximize function.   OBJECTIVE IMPAIRMENTS decreased mobility, decreased strength, increased fascial restrictions,  increased muscle spasms, impaired flexibility, postural dysfunction, and pain.    ACTIVITY LIMITATIONS carrying, lifting, standing, squatting, sleeping, and locomotion level   PARTICIPATION LIMITATIONS: cleaning, laundry, and community activity   PERSONAL FACTORS 3+ comorbidities: Migraines, Lt THA, Lt/Rt TKA, HTN, OA  are also affecting patient's functional outcome.    REHAB POTENTIAL: Good   CLINICAL DECISION MAKING: Evolving/moderate complexity   EVALUATION COMPLEXITY: Moderate     GOALS: Goals reviewed with patient? Yes   SHORT TERM GOALS: Target date: 07/25/2022   Independent with initial HEP Goal status: MET 07/11/22     LONG TERM GOALS: Target date: 08/15/2022   Independent with final HEP Goal status: INITIAL   2.  FOTO score improved to 66 Goal status: INITIAL   3.  Rt hi strength improved to 4/5 for improved mobility and function Goal status: INIITAL   4.  Report pain < 4/10 with increased activity and walking for improved function Goal status: INITIAL   5.  Demonstrate negative slump test for improved function and decreased neural tension Goal status: INITIAL         PLAN: PT FREQUENCY: 1-2x/week   PT DURATION: 6 weeks   PLANNED INTERVENTIONS: Therapeutic exercises, Therapeutic activity, Neuromuscular re-education, Patient/Family  education, Self Care, Joint mobilization, Aquatic Therapy, Dry Needling, Electrical stimulation, Spinal manipulation, Spinal mobilization, Cryotherapy, Moist heat, Taping, Traction, Ionotophoresis 67m/ml Dexamethasone, Manual therapy, and Re-evaluation.   PLAN FOR NEXT SESSION:  hip/core strengthening in neutral (has flexion preference so limit extension for now), lateral shift correction PRN   SFaustino Congress PT,DPT 07/11/2022, 9:33 AM

## 2022-07-14 ENCOUNTER — Encounter: Payer: Self-pay | Admitting: Physical Therapy

## 2022-07-14 ENCOUNTER — Ambulatory Visit: Payer: PPO | Admitting: Physical Therapy

## 2022-07-14 DIAGNOSIS — M5416 Radiculopathy, lumbar region: Secondary | ICD-10-CM | POA: Diagnosis not present

## 2022-07-14 DIAGNOSIS — M25551 Pain in right hip: Secondary | ICD-10-CM

## 2022-07-14 DIAGNOSIS — M6281 Muscle weakness (generalized): Secondary | ICD-10-CM

## 2022-07-14 NOTE — Therapy (Signed)
OUTPATIENT PHYSICAL THERAPY TREATMENT NOTE   Patient Name: Sharon [name] MRN: [id number] DOB:1950/12/30, 70 y.o., female Today's Date: 07/14/2022  END OF SESSION:   PT End of Session - 07/14/22 0844     Visit Number 4    Number of Visits 12    Date for PT Re-Evaluation 08/15/22    Authorization Type HTA $15 copay    Progress Note Due on Visit 10    PT Start Time 0845    PT Stop Time 0926    PT Time Calculation (min) 41 min    Activity Tolerance Patient tolerated treatment well    Behavior During Therapy [facility] for tasks assessed/performed               Past Medical History:  Diagnosis Date   Cataract    Family history of adverse reaction to anesthesia    "mom did; don't know what happened" (07/20/2018)   Family history of colon cancer    Family history of ovarian cancer    Family history of stomach cancer    Family history of uterine cancer    Fibroid    Heart murmur    "tiny, tiny one" (07/20/2018)   High cholesterol    Hypertension    Migraine    "a few/year; last one was 07/19/2018" (07/20/2018)   Osteoarthritis    "knees, hips, hands, probably in my back" (07/20/2018)   Ringworm    Vitamin D deficiency    Past Surgical History:  Procedure Laterality Date   APPENDECTOMY  1969   BUNIONECTOMY Bilateral    EYE SURGERY Bilateral    JOINT REPLACEMENT     KNEE ARTHROSCOPY Left 11/2013   Precancerous lesion excised     "arm"   TONSILLECTOMY     TOTAL ABDOMINAL HYSTERECTOMY  1990   TAH BSO   TOTAL HIP ARTHROPLASTY Left 07/20/2018   TOTAL HIP ARTHROPLASTY Left 07/20/2018   Procedure: LEFT TOTAL HIP ARTHROPLASTY ANTERIOR APPROACH;  Surgeon: Mcarthur Rossetti, MD;  Location: [place];  Service: Orthopedics;  Laterality: Left;   TOTAL KNEE ARTHROPLASTY Right 2012   TOTAL KNEE ARTHROPLASTY Left 04/03/2015   Procedure: TOTAL KNEE ARTHROPLASTY;  Surgeon: Garald Balding, MD;  Location: [place];  Service: Orthopedics;  Laterality: Left;   Patient Active Problem  List   Diagnosis Date Noted   Liver cyst 01/28/2022   Pain in left shoulder 01/10/2021   Neck pain, acute 04/11/2020   Asymptomatic varicose veins of bilateral lower extremities 02/09/2020   Functional dyspepsia 11/22/2019   Aortic valve disorder 11/17/2019   Family history of cancer 11/17/2019   Hyperlipidemia 11/17/2019   Lactose intolerance 11/17/2019   Migraine, unspecified, not intractable, without status migrainosus 11/17/2019   Pain in right ankle and joints of right foot 07/26/2019   Status post total replacement of left hip 07/20/2018   Unilateral primary osteoarthritis, left hip 05/25/2018   Genetic testing 11/14/2016   Family history of ovarian cancer    Family history of colon cancer    Family history of uterine cancer    Family history of stomach cancer    Osteoarthritis of left knee 04/03/2015   S/P total knee replacement using cement 04/03/2015   Gait abnormality 12/22/2012   Swelling of ankle 12/22/2012   Hypertension    Headache(784.0)    Arthritis    Vitamin D deficiency      THERAPY DIAG:  Radiculopathy, lumbar region  Pain in right hip  Muscle weakness (generalized)  PCP: Jacalyn Lefevre,  Jesse Sans, MD    REFERRING PROVIDER: Garald Balding, MD    REFERRING DIAG: [phone] (ICD-10-CM) - Pain in right hip, lower back pain    Rationale for Evaluation and Treatment Rehabilitation   ONSET DATE: 2 weeks ago   SUBJECTIVE:                                                                                                                                                                                            SUBJECTIVE STATEMENT: Pain is going down to her foot again, thinks shoes may have an impact on that.      PERTINENT HISTORY:  Migraines, Lt THA, Lt/Rt TKA, HTN, OA   PAIN:  Are you having pain? Yes: NPRS scale: 3 currently, up to 4-5, at best 0/10 Pain location: back, Rt hip, leg (to Rt ankle) Pain description: aching, sharp Aggravating factors: certain  stretches, walking Relieving factors: possible flexion     PRECAUTIONS: None   WEIGHT BEARING RESTRICTIONS No   FALLS:  Has patient fallen in last 6 months? 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[employer])   PLOF: Leisure: pool walking/running 3x/wk, reading, crafting   PATIENT GOALS improve pain     OBJECTIVE:    DIAGNOSTIC FINDINGS:  X-rays: There are some degenerative changes about the right hip with superior and inferior spurring-minimal    PATIENT SURVEYS:  07/04/22: FOTO 43 (predicted 66)   SCREENING FOR RED FLAGS: Bowel or bladder incontinence: No Spinal tumors: No   COGNITION: Overall cognitive status: Within functional limits for tasks assessed                          SENSATION: WFL   MUSCLE LENGTH: No significant tightness noted   POSTURE: rounded shoulders and forward head   PALPATION: Tenderness T12-L5 CPA mobs, tenderness bil QL, Lt > Rt SIJ tenderness; Rt hip flexor tightness with trigger points   LUMBAR ROM:    Active  A/PROM  eval  Flexion WNL  Extension Limited 25% with increase in symptoms  Right lateral flexion WNL  Left lateral flexion WNL  Right rotation WNL  Left rotation WNL   (Blank rows = not tested)   LOWER EXTREMITY ROM:      07/04/22: grossly WNL for functional activities assessed except Rt hip ext due to hip flexor tightness   LOWER EXTREMITY MMT:     MMT Right eval  Left eval  Hip flexion 3+/5 4/5  Hip extension      Hip abduction      Hip adduction      Hip internal rotation      Hip external rotation      Knee flexion      Knee extension 5/5 5/5  Ankle dorsiflexion      Ankle plantarflexion      Ankle inversion      Ankle eversion       (Blank rows = not tested)   LUMBAR SPECIAL TESTS:  07/04/22 Slump test:  Positive (mildly positive on Rt with increase in back pain, decrease with reduction in neural tension)     GAIT: Independent   TODAY'S TREATMENT  07/14/22 TherEx: NuStep L5 x 8 min Rt lateral shift correction 10 x 10 sec hold Seated slump stretch 10 x 10 sec hold Prone on elbows x 3 min hold (no significant change) Bridges x 10 reps Single knee to chest 3x20 sec Standing hip extension with 3 sec hold x 10 reps   07/11/22 TherEx: NuStep L5 x 8 min Standing hip abduction x 10 reps bil Standing hip extension x 10 reps bil Standing hip flexion x 10 reps Supine hip flexor/quad stretch 3x30 sec bil Long sitting slump stretch 10 x 3 sec   Rt lateral shift correction 10 x 10 sec hold Mini squats at counter x 10 reps  07/08/22 -Nu step L6-5 X 8 min UE/LE -Seated lumbar flexion stretch 5 sec X10 -Seated slump stretch 3 sec X 10 bilat -Standing hip hike X 10 bilat -Standing marches X 10 bilat -Standing hip abd X 10 bilat -Standing hip extension X 10 bialt -Supine piriformis stretch 3X30 sec bilat -Supine bridges X10 -Supine hip flexor stretch leg off EOB 3X30 sec  -Manual therapy: Rt leg long axis distraction X 3 min  07/04/22 See HEP - performed trial reps PRN for comprehension, with min to mod cues for technique     PATIENT EDUCATION:  Education details: HEP Person educated: Patient Education method: Consulting civil engineer, Media planner, and Handouts Education comprehension: verbalized understanding, returned demonstration, and needs further education     HOME EXERCISE PROGRAM: Access Code: [email or web address] URL: [email or web address] Date: 07/08/2022 Prepared by: Elsie Ra  Exercises - Hooklying Single Knee to Chest  - 2 x daily - 7 x weekly - 1 sets - 3 reps - 30 sec hold - Supine Piriformis Stretch with Foot on Ground  - 2 x daily - 7 x weekly - 1 sets - 3 reps - 30 sec hold - Supine Posterior Pelvic Tilt  - 2 x daily - 7 x weekly - 1 sets - 10 reps - 5 sec hold -  Supine Bridge  - 2 x daily - 7 x weekly - 1 sets - 10 reps - 5 sec hold - Slump Stretch  - 2 x daily - 7 x weekly - 1 sets - 10 reps - 5-10 sec hold - Standing Hip Hiking  - 2 x daily - 7 x weekly - 1 sets - 10 reps - 5-10 sec hold - Supine Quadriceps Stretch with Strap on Table  - 1 x daily - 6 x weekly - 2-3 reps - 30 hold - Standing Hip Extension with Counter Support  - 2 x daily - 6 x weekly - 1 sets - 10 reps - Standing Hip Abduction with Counter Support  - 2 x daily - 6 x weekly - 1 sets - 10 reps - alternating  marching  - 2 x daily - 6 x weekly - 1 sets - 10 reps   ASSESSMENT:   CLINICAL IMPRESSION: Pt with reduction in pain to 2/10 after session today.  Trial of extension without change in symptoms at this time.  Will continue to benefit from PT to maximize function.   OBJECTIVE IMPAIRMENTS decreased mobility, decreased strength, increased fascial restrictions, increased muscle spasms, impaired flexibility, postural dysfunction, and pain.    ACTIVITY LIMITATIONS carrying, lifting, standing, squatting, sleeping, and locomotion level   PARTICIPATION LIMITATIONS: cleaning, laundry, and community activity   PERSONAL FACTORS 3+ comorbidities: Migraines, Lt THA, Lt/Rt TKA, HTN, OA  are also affecting patient's functional outcome.    REHAB POTENTIAL: Good   CLINICAL DECISION MAKING: Evolving/moderate complexity   EVALUATION COMPLEXITY: Moderate     GOALS: Goals reviewed with patient? Yes   SHORT TERM GOALS: Target date: 07/25/2022   Independent with initial HEP Goal status: MET 07/11/22     LONG TERM GOALS: Target date: 08/15/2022   Independent with final HEP Goal status: INITIAL   2.  FOTO score improved to 66 Goal status: INITIAL   3.  Rt hi strength improved to 4/5 for improved mobility and function Goal status: INIITAL   4.  Report pain < 4/10 with increased activity and walking for improved function Goal status: INITIAL   5.  Demonstrate negative slump test for  improved function and decreased neural tension Goal status: INITIAL         PLAN: PT FREQUENCY: 1-2x/week   PT DURATION: 6 weeks   PLANNED INTERVENTIONS: Therapeutic exercises, Therapeutic activity, Neuromuscular re-education, Patient/Family education, Self Care, Joint mobilization, Aquatic Therapy, Dry Needling, Electrical stimulation, Spinal manipulation, Spinal mobilization, Cryotherapy, Moist heat, Taping, Traction, Ionotophoresis 74m/ml Dexamethasone, Manual therapy, and Re-evaluation.   PLAN FOR NEXT SESSION:  continue with above, ?dry needling,  hip/core strengthening in neutral (has flexion preference so limit extension for now), lateral shift correction PRN   SFaustino Congress PT,DPT 07/14/2022, 9:27 AM

## 2022-07-17 ENCOUNTER — Ambulatory Visit: Payer: PPO | Admitting: Physical Therapy

## 2022-07-17 ENCOUNTER — Encounter: Payer: Self-pay | Admitting: Physical Therapy

## 2022-07-17 ENCOUNTER — Other Ambulatory Visit: Payer: Self-pay

## 2022-07-17 ENCOUNTER — Encounter: Payer: Self-pay | Admitting: Orthopaedic Surgery

## 2022-07-17 DIAGNOSIS — M5416 Radiculopathy, lumbar region: Secondary | ICD-10-CM

## 2022-07-17 DIAGNOSIS — M6281 Muscle weakness (generalized): Secondary | ICD-10-CM | POA: Diagnosis not present

## 2022-07-17 DIAGNOSIS — M25551 Pain in right hip: Secondary | ICD-10-CM

## 2022-07-17 NOTE — Therapy (Addendum)
OUTPATIENT PHYSICAL THERAPY TREATMENT NOTE  DISCHARGE SUMMARY   Patient Name: Sharon [name] MRN: [id number] DOB:04-14-1951, 71 y.o., female Today's Date: 07/17/2022  END OF SESSION:   PT End of Session - 07/17/22 0850     Visit Number 5    Number of Visits 12    Date for PT Re-Evaluation 08/15/22    Authorization Type HTA $15 copay    Progress Note Due on Visit 10    PT Start Time 0848    PT Stop Time 0915    PT Time Calculation (min) 27 min    Activity Tolerance Patient tolerated treatment well    Behavior During Therapy [facility] for tasks assessed/performed                Past Medical History:  Diagnosis Date   Cataract    Family history of adverse reaction to anesthesia    "mom did; don't know what happened" (07/20/2018)   Family history of colon cancer    Family history of ovarian cancer    Family history of stomach cancer    Family history of uterine cancer    Fibroid    Heart murmur    "tiny, tiny one" (07/20/2018)   High cholesterol    Hypertension    Migraine    "a few/year; last one was 07/19/2018" (07/20/2018)   Osteoarthritis    "knees, hips, hands, probably in my back" (07/20/2018)   Ringworm    Vitamin D deficiency    Past Surgical History:  Procedure Laterality Date   APPENDECTOMY  1969   BUNIONECTOMY Bilateral    EYE SURGERY Bilateral    JOINT REPLACEMENT     KNEE ARTHROSCOPY Left 11/2013   Precancerous lesion excised     "arm"   TONSILLECTOMY     TOTAL ABDOMINAL HYSTERECTOMY  1990   TAH BSO   TOTAL HIP ARTHROPLASTY Left 07/20/2018   TOTAL HIP ARTHROPLASTY Left 07/20/2018   Procedure: LEFT TOTAL HIP ARTHROPLASTY ANTERIOR APPROACH;  Surgeon: Mcarthur Rossetti, MD;  Location: [place];  Service: Orthopedics;  Laterality: Left;   TOTAL KNEE ARTHROPLASTY Right 2012   TOTAL KNEE ARTHROPLASTY Left 04/03/2015   Procedure: TOTAL KNEE ARTHROPLASTY;  Surgeon: Garald Balding, MD;  Location: [place];  Service: Orthopedics;  Laterality: Left;    Patient Active Problem List   Diagnosis Date Noted   Liver cyst 01/28/2022   Pain in left shoulder 01/10/2021   Neck pain, acute 04/11/2020   Asymptomatic varicose veins of bilateral lower extremities 02/09/2020   Functional dyspepsia 11/22/2019   Aortic valve disorder 11/17/2019   Family history of cancer 11/17/2019   Hyperlipidemia 11/17/2019   Lactose intolerance 11/17/2019   Migraine, unspecified, not intractable, without status migrainosus 11/17/2019   Pain in right ankle and joints of right foot 07/26/2019   Status post total replacement of left hip 07/20/2018   Unilateral primary osteoarthritis, left hip 05/25/2018   Genetic testing 11/14/2016   Family history of ovarian cancer    Family history of colon cancer    Family history of uterine cancer    Family history of stomach cancer    Osteoarthritis of left knee 04/03/2015   S/P total knee replacement using cement 04/03/2015   Gait abnormality 12/22/2012   Swelling of ankle 12/22/2012   Hypertension    Headache(784.0)    Arthritis    Vitamin D deficiency      THERAPY DIAG:  Radiculopathy, lumbar region  Pain in right hip  Muscle weakness (  generalized)  PCP: Michael Boston, MD    REFERRING PROVIDER: Garald Balding, MD    REFERRING DIAG: [phone] (ICD-10-CM) - Pain in right hip, lower back pain    Rationale for Evaluation and Treatment Rehabilitation   ONSET DATE: 2 weeks ago   SUBJECTIVE:                                                                                                                                                                                            SUBJECTIVE STATEMENT: Exercise feels better for awhile, but overall feels like she's having more trouble.  Has noticed some challenges with dorsiflexion recently.  PERTINENT HISTORY:  Migraines, Lt THA, Lt/Rt TKA, HTN, OA   PAIN:  Are you having pain? Yes: NPRS scale: 3 currently, up to 4-5, at best 0/10 Pain location: back, Rt  hip, leg (to Rt ankle) Pain description: aching, sharp Aggravating factors: certain stretches, walking Relieving factors: possible flexion     PRECAUTIONS: None   WEIGHT BEARING RESTRICTIONS No   FALLS:  Has patient fallen in last 6 months? 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[employer])   PLOF: Leisure: pool walking/running 3x/wk, reading, crafting   PATIENT GOALS improve pain     OBJECTIVE:    DIAGNOSTIC FINDINGS:  X-rays: There are some degenerative changes about the right hip with superior and inferior spurring-minimal    PATIENT SURVEYS:  07/04/22: FOTO 43 (predicted 66) 07/17/22: FOTO 47   SCREENING FOR RED FLAGS: Bowel or bladder incontinence: No Spinal tumors: No   COGNITION: Overall cognitive status: Within functional limits for tasks assessed                          SENSATION: WFL   MUSCLE LENGTH: No significant tightness noted   POSTURE: rounded shoulders and forward head   PALPATION: Tenderness T12-L5 CPA mobs, tenderness bil QL, Lt > Rt SIJ tenderness; Rt hip flexor tightness with trigger points   LUMBAR ROM:    Active  A/PROM  eval  Flexion WNL  Extension Limited 25% with increase in symptoms  Right lateral flexion WNL  Left lateral flexion WNL  Right rotation WNL  Left rotation WNL   (Blank rows = not tested)   LOWER EXTREMITY ROM:      07/04/22: grossly WNL for functional activities assessed except Rt hip ext due to hip flexor tightness   LOWER EXTREMITY  MMT:     MMT Right eval Left eval Right 07/17/22 Left 07/17/22  Hip flexion 3+/5 4/5 3+/5 4/5  Hip extension        Hip abduction        Hip adduction        Hip internal rotation        Hip external rotation        Knee flexion        Knee extension 5/5 5/5    Ankle  dorsiflexion     3/5   Ankle plantarflexion        Ankle inversion        Ankle eversion         (Blank rows = not tested)   LUMBAR SPECIAL TESTS:  07/04/22 Slump test: Positive (mildly positive on Rt with increase in back pain, decrease with reduction in neural tension)  07/17/22: Slump test: Negative     GAIT: Independent   TODAY'S TREATMENT  07/17/22 TherEx MMT and slump tests as noted above Discussed current HEP and exercise - recommend to continue as able and let pain be guide.  Recommended continued aquatic exercise as well as able with pool renovations.  Self-Care Discussed DF weakness which pt reports is a new change, with difficulty maintaining DF and 3/5 strength noted today.  At this time given change in strength recommend holding PT and pt follow up with MD about changes.  PT to discuss with MD as well.  Pt in agreement and will hold PT at this time.  Urgent/emergent changes discussed as well should she experience any other changes.  07/14/22 TherEx: NuStep L5 x 8 min Rt lateral shift correction 10 x 10 sec hold Seated slump stretch 10 x 10 sec hold Prone on elbows x 3 min hold (no significant change) Bridges x 10 reps Single knee to chest 3x20 sec Standing hip extension with 3 sec hold x 10 reps   07/11/22 TherEx: NuStep L5 x 8 min Standing hip abduction x 10 reps bil Standing hip extension x 10 reps bil Standing hip flexion x 10 reps Supine hip flexor/quad stretch 3x30 sec bil Long sitting slump stretch 10 x 3 sec   Rt lateral shift correction 10 x 10 sec hold Mini squats at counter x 10 reps  07/08/22 -Nu step L6-5 X 8 min UE/LE -Seated lumbar flexion stretch 5 sec X10 -Seated slump stretch 3 sec X 10 bilat -Standing hip hike X 10 bilat -Standing marches X 10 bilat -Standing hip abd X 10 bilat -Standing hip extension X 10 bialt -Supine piriformis stretch 3X30 sec bilat -Supine bridges X10 -Supine hip flexor stretch leg off EOB 3X30 sec  -Manual  therapy: Rt leg long axis distraction X 3 min     PATIENT EDUCATION:  Education details: HEP Person educated: Patient Education method: Consulting civil engineer, Media planner, and Handouts Education comprehension: verbalized understanding, returned demonstration, and needs further education     HOME EXERCISE PROGRAM: Access Code: [email or web address] URL: [email or web address] Date: 07/08/2022 Prepared by: Elsie Ra  Exercises - Hooklying Single Knee to Chest  - 2 x daily - 7 x weekly - 1 sets - 3 reps - 30 sec hold - Supine Piriformis Stretch with Foot on Ground  - 2 x daily - 7 x weekly - 1 sets - 3 reps - 30 sec hold - Supine Posterior Pelvic Tilt  - 2 x daily - 7 x weekly - 1 sets - 10 reps - 5 sec hold - Supine Bridge  -  2 x daily - 7 x weekly - 1 sets - 10 reps - 5 sec hold - Slump Stretch  - 2 x daily - 7 x weekly - 1 sets - 10 reps - 5-10 sec hold - Standing Hip Hiking  - 2 x daily - 7 x weekly - 1 sets - 10 reps - 5-10 sec hold - Supine Quadriceps Stretch with Strap on Table  - 1 x daily - 6 x weekly - 2-3 reps - 30 hold - Standing Hip Extension with Counter Support  - 2 x daily - 6 x weekly - 1 sets - 10 reps - Standing Hip Abduction with Counter Support  - 2 x daily - 6 x weekly - 1 sets - 10 reps - alternating marching  - 2 x daily - 6 x weekly - 1 sets - 10 reps   ASSESSMENT:   CLINICAL IMPRESSION: Pt with clinical change in dorsiflexion strength and pain continues to be persistent at this time.  Recommend holding PT and pt will follow back up with MD to discuss possible imaging or other needs.  She did meet LTG #5 at this time.   OBJECTIVE IMPAIRMENTS decreased mobility, decreased strength, increased fascial restrictions, increased muscle spasms, impaired flexibility, postural dysfunction, and pain.    ACTIVITY LIMITATIONS carrying, lifting, standing, squatting, sleeping, and locomotion level   PARTICIPATION LIMITATIONS: cleaning, laundry, and community activity    PERSONAL FACTORS 3+ comorbidities: Migraines, Lt THA, Lt/Rt TKA, HTN, OA  are also affecting patient's functional outcome.    REHAB POTENTIAL: Good   CLINICAL DECISION MAKING: Evolving/moderate complexity   EVALUATION COMPLEXITY: Moderate     GOALS: Goals reviewed with patient? Yes   SHORT TERM GOALS: Target date: 07/25/2022   Independent with initial HEP Goal status: MET 07/11/22     LONG TERM GOALS: Target date: 08/15/2022   Independent with final HEP Goal status: Ongoing 07/17/22   2.  FOTO score improved to 66 Goal status: Ongoing 07/17/22   3.  Rt hip strength improved to 4/5 for improved mobility and function Goal status: Ongoing 07/17/22   4.  Report pain < 4/10 with increased activity and walking for improved function Goal status: Improved, up to 5/10; Ongoing 07/17/22   5.  Demonstrate negative slump test for improved function and decreased neural tension Goal status: MET 07/17/22         PLAN: PT FREQUENCY: 1-2x/week   PT DURATION: 6 weeks   PLANNED INTERVENTIONS: Therapeutic exercises, Therapeutic activity, Neuromuscular re-education, Patient/Family education, Self Care, Joint mobilization, Aquatic Therapy, Dry Needling, Electrical stimulation, Spinal manipulation, Spinal mobilization, Cryotherapy, Moist heat, Taping, Traction, Ionotophoresis 81m/ml Dexamethasone, Manual therapy, and Re-evaluation.   PLAN FOR NEXT SESSION:  hold PT, see what MD says    SFaustino Congress PT,DPT 07/17/2022, 11:16 AM      PHYSICAL THERAPY DISCHARGE SUMMARY  Visits from Start of Care: 5  Current functional level related to goals / functional outcomes: See above   Remaining deficits: See above   Education / Equipment: HEP   Patient agrees to discharge. Patient goals were partially met. Patient is being discharged due to did not respond to therapy.   SLaureen Abrahams PT, DPT 09/29/22 10:39 AM CR[facility] HospitalPhysical Therapy [address] [place] [address]Phone: [phone]  Fax:  [phone]

## 2022-07-21 ENCOUNTER — Encounter: Payer: PPO | Admitting: Physical Therapy

## 2022-07-21 DIAGNOSIS — L821 Other seborrheic keratosis: Secondary | ICD-10-CM | POA: Diagnosis not present

## 2022-07-21 DIAGNOSIS — L570 Actinic keratosis: Secondary | ICD-10-CM | POA: Diagnosis not present

## 2022-07-22 ENCOUNTER — Encounter: Payer: PPO | Admitting: Physical Therapy

## 2022-07-23 ENCOUNTER — Encounter: Payer: PPO | Admitting: Physical Therapy

## 2022-07-24 ENCOUNTER — Encounter: Payer: PPO | Admitting: Physical Therapy

## 2022-07-24 ENCOUNTER — Ambulatory Visit
Admission: RE | Admit: 2022-07-24 | Discharge: 2022-07-24 | Disposition: A | Discharge status: Home / Self Care | Payer: PPO | Source: Ambulatory Visit | Attending: Orthopaedic Surgery | Admitting: Orthopaedic Surgery

## 2022-07-24 DIAGNOSIS — M545 Low back pain, unspecified: Secondary | ICD-10-CM | POA: Diagnosis not present

## 2022-07-24 DIAGNOSIS — M5416 Radiculopathy, lumbar region: Secondary | ICD-10-CM

## 2022-07-24 DIAGNOSIS — R200 Anesthesia of skin: Secondary | ICD-10-CM | POA: Diagnosis not present

## 2022-08-07 NOTE — Telephone Encounter (Signed)
Please check with patient-thinks she wants an MRI of the right hip

## 2022-08-12 ENCOUNTER — Other Ambulatory Visit: Payer: Self-pay

## 2022-08-12 DIAGNOSIS — M25551 Pain in right hip: Secondary | ICD-10-CM

## 2022-08-12 NOTE — Telephone Encounter (Signed)
Please make an appt with Dr Ernestina Patches to inject right hip-thanks. Discussed results of MRI

## 2022-08-13 ENCOUNTER — Ambulatory Visit: Payer: PPO | Admitting: Orthopaedic Surgery

## 2022-08-20 ENCOUNTER — Ambulatory Visit (INDEPENDENT_AMBULATORY_CARE_PROVIDER_SITE_OTHER): Payer: PPO | Admitting: Physical Medicine and Rehabilitation

## 2022-08-20 ENCOUNTER — Ambulatory Visit: Payer: Self-pay

## 2022-08-20 DIAGNOSIS — M25551 Pain in right hip: Secondary | ICD-10-CM

## 2022-08-20 NOTE — Progress Notes (Unsigned)
Numeric Pain Rating Scale and Functional Assessment Average Pain 2   In the last MONTH (on 0-10 scale) has pain interfered with the following?  1. General activity like being  able to carry out your everyday physical activities such as walking, climbing stairs, carrying groceries, or moving a chair?  Rating(4)   +Driver, -BT, -Dye Allergies.  Pain can depend on the type of shoes she has on. Pain comes and goes and sometimes rolling over in bed can be painful

## 2022-08-20 NOTE — Progress Notes (Unsigned)
   Sharon [name] - 71 y.o. female MRN [id number]  Date of birth: 04-Apr-1951  Office Visit Note: Visit Date: 08/20/2022 PCP: Michael Boston, MD Referred by: Michael Boston, MD  Subjective: Chief Complaint  Patient presents with   Right Hip - Pain   HPI:  Sharon [name] is a 71 y.o. female who comes in todayHPI ROS Otherwise per HPI.  Assessment & Plan: Visit Diagnoses:    ICD-10-CM   1. Pain in right hip  M25.551 XR C-ARM NO REPORT      Plan: No additional findings.   Meds & Orders: No orders of the defined types were placed in this encounter.   Orders Placed This Encounter  Procedures   XR C-ARM NO REPORT    Follow-up: No follow-ups on file.   Procedures: Large Joint Inj: R hip joint on 08/20/2022 2:53 PM Indications: diagnostic evaluation and pain Details: 22 G 3.5 in needle, fluoroscopy-guided anterior approach  Arthrogram: No  Medications: 4 mL bupivacaine 0.25 %; 40 mg triamcinolone acetonide 40 MG/ML Outcome: tolerated well, no immediate complications  There was excellent flow of contrast producing a partial arthrogram of the hip. The patient did have relief of symptoms during the anesthetic phase of the injection. Procedure, treatment alternatives, risks and benefits explained, specific risks discussed. Consent was given by the patient. Immediately prior to procedure a time out was called to verify the correct patient, procedure, equipment, support staff and site/side marked as required. Patient was prepped and draped in the usual sterile fashion.          Clinical History: No specialty comments available.     Objective:  VS:  HT:    WT:   BMI:     BP:   HR: bpm  TEMP: ( )  RESP:  Physical Exam   Imaging: No results found.

## 2022-08-21 MED ORDER — BUPIVACAINE HCL 0.25 % IJ SOLN
4.0000 mL | INTRAMUSCULAR | Status: AC | PRN
  Administered 2022-08-20: 4 mL via INTRA_ARTICULAR

## 2022-08-21 MED ORDER — TRIAMCINOLONE ACETONIDE 40 MG/ML IJ SUSP
40.0000 mg | INTRAMUSCULAR | Status: AC | PRN
  Administered 2022-08-20: 40 mg via INTRA_ARTICULAR

## 2022-09-05 DIAGNOSIS — R5383 Other fatigue: Secondary | ICD-10-CM | POA: Diagnosis not present

## 2022-09-05 DIAGNOSIS — J069 Acute upper respiratory infection, unspecified: Secondary | ICD-10-CM | POA: Diagnosis not present

## 2022-09-05 DIAGNOSIS — R051 Acute cough: Secondary | ICD-10-CM | POA: Diagnosis not present

## 2022-09-05 DIAGNOSIS — J029 Acute pharyngitis, unspecified: Secondary | ICD-10-CM | POA: Diagnosis not present

## 2022-09-05 DIAGNOSIS — I10 Essential (primary) hypertension: Secondary | ICD-10-CM | POA: Diagnosis not present

## 2022-09-05 DIAGNOSIS — Z1152 Encounter for screening for COVID-19: Secondary | ICD-10-CM | POA: Diagnosis not present

## 2022-09-05 DIAGNOSIS — R0981 Nasal congestion: Secondary | ICD-10-CM | POA: Diagnosis not present

## 2022-09-05 DIAGNOSIS — R21 Rash and other nonspecific skin eruption: Secondary | ICD-10-CM | POA: Diagnosis not present

## 2022-09-17 ENCOUNTER — Ambulatory Visit (INDEPENDENT_AMBULATORY_CARE_PROVIDER_SITE_OTHER): Payer: PPO

## 2022-09-17 ENCOUNTER — Encounter: Payer: Self-pay | Admitting: Orthopaedic Surgery

## 2022-09-17 ENCOUNTER — Ambulatory Visit (INDEPENDENT_AMBULATORY_CARE_PROVIDER_SITE_OTHER): Payer: PPO | Admitting: Orthopaedic Surgery

## 2022-09-17 DIAGNOSIS — Z96652 Presence of left artificial knee joint: Secondary | ICD-10-CM | POA: Diagnosis not present

## 2022-09-17 DIAGNOSIS — M25562 Pain in left knee: Secondary | ICD-10-CM

## 2022-09-17 DIAGNOSIS — M1712 Unilateral primary osteoarthritis, left knee: Secondary | ICD-10-CM

## 2022-09-17 DIAGNOSIS — E785 Hyperlipidemia, unspecified: Secondary | ICD-10-CM | POA: Diagnosis not present

## 2022-09-17 DIAGNOSIS — I10 Essential (primary) hypertension: Secondary | ICD-10-CM | POA: Diagnosis not present

## 2022-09-17 NOTE — Progress Notes (Signed)
Office Visit Note   Patient: Sharon [name]           Date of Birth: 1950-11-05           MRN: [id number] Visit Date: 09/17/2022              Requested by: Michael Boston, MD [address] [place],  [place] [address] PCP: Michael Boston, MD   Assessment & Plan: Visit Diagnoses:  1. Acute pain of left knee   2. Primary osteoarthritis of left knee   3. Status post total left knee replacement using cement     Plan: Insidious onset of left knee pain about a week ago while shopping.  No injury or trauma.  The knee has not been hot red warm or swollen.  On occasion has a feeling that her knee may want to give when she uses a cane.  X-rays and evaluation were benign.  The knee was not hot red or swollen.  No instability.  No effusion.  No localized areas of tenderness.  No hip pain.  Has been diagnosed by her primary care physician with some muscle issues and has been prescribed magnesium.  This could be related to the muscle problem as her exam was relatively benign.  I would wait several weeks before considering any lab work or even a three-phase bone scan to rule out any loosening.  Certainly no evidence of infection.  Follow-Up Instructions: Return in about 2 weeks (around 10/01/2022).   Orders:  Orders Placed This Encounter  Procedures   XR KNEE 3 VIEW LEFT   No orders of the defined types were placed in this encounter.     Procedures: No procedures performed   Clinical Data: No additional findings.   Subjective: Chief Complaint  Patient presents with   Left Knee - Pain  Experienced onset of left knee pain about a week ago while shopping.  Has had several episodes where she has some acute pain and then it simply goes away.  Her knee has not been swollen or red.  There is no black and blue discoloration.  Does use a cane only with the prospect of having some discomfort.  No back or groin discomfort  HPI  Review of Systems   Objective: Vital Signs: LMP 02/17/1989    Physical Exam Constitutional:      Appearance: She is well-developed.  Eyes:     Pupils: Pupils are equal, round, and reactive to light.  Pulmonary:     Effort: Pulmonary effort is normal.  Skin:    General: Skin is warm and dry.  Neurological:     Mental Status: She is alert and oriented to person, place, and time.  Psychiatric:        Behavior: Behavior normal.     Ortho Exam awake alert and oriented x3.  Comfortable sitting.  Walks without a limp but does use a cane "just in case".  Left knee exam was benign with quick full quick extension and flexion at least to 106 or 7 degrees with the goniometer.  No effusion.  No instability with varus or valgus stress.  Little bit of fullness in the popliteal space but no pain.  No localized areas of tenderness about the femur or tibia.  No pain with patella motion or with compression.  No distal edema.  No calf pain.  No thigh discomfort.  No pain range of motion left hip  Specialty Comments:  No specialty comments available.  Imaging:  XR KNEE 3 VIEW LEFT  Result Date: 09/17/2022 Films of the left total knee replacement obtained in 3 projections.  There is no obvious complication.  Surgery was performed in 2016.  Nice cement mantle and both femoral and tibial components as well as the patella component.  Joint spacing is symmetrical bilaterally    PMFS History: Patient Active Problem List   Diagnosis Date Noted   Liver cyst 01/28/2022   Pain in left shoulder 01/10/2021   Neck pain, acute 04/11/2020   Asymptomatic varicose veins of bilateral lower extremities 02/09/2020   Functional dyspepsia 11/22/2019   Aortic valve disorder 11/17/2019   Family history of cancer 11/17/2019   Hyperlipidemia 11/17/2019   Lactose intolerance 11/17/2019   Migraine, unspecified, not intractable, without status migrainosus 11/17/2019   Pain in right ankle and joints of right foot 07/26/2019   Status post total replacement of left hip 07/20/2018    Unilateral primary osteoarthritis, left hip 05/25/2018   Genetic testing 11/14/2016   Family history of ovarian cancer    Family history of colon cancer    Family history of uterine cancer    Family history of stomach cancer    Osteoarthritis of left knee 04/03/2015   S/P total knee replacement using cement 04/03/2015   Gait abnormality 12/22/2012   Swelling of ankle 12/22/2012   Hypertension    Headache(784.0)    Arthritis    Vitamin D deficiency    Past Medical History:  Diagnosis Date   Cataract    Family history of adverse reaction to anesthesia    "mom did; don't know what happened" (07/20/2018)   Family history of colon cancer    Family history of ovarian cancer    Family history of stomach cancer    Family history of uterine cancer    Fibroid    Heart murmur    "tiny, tiny one" (07/20/2018)   High cholesterol    Hypertension    Migraine    "a few/year; last one was 07/19/2018" (07/20/2018)   Osteoarthritis    "knees, hips, hands, probably in my back" (07/20/2018)   Ringworm    Vitamin D deficiency     Family History  Problem Relation Age of Onset   Ovarian cancer Mother 1   Heart disease Maternal Grandfather    Hypertension Paternal Grandmother    Ovarian cancer Cousin        maternal first cousin dx in her 18s   Heart disease Father    Atrial fibrillation Father    Heart disease Brother    Heart disease Brother    Colon cancer Maternal Aunt        dx in her 60s-60   Diabetes Paternal Aunt    Ovarian cancer Maternal Grandmother        dx in her mid 97s   Cancer Maternal Grandmother        OVARIAN AND UTERINE   Uterine cancer Maternal Grandmother        dx in her early 33s   Stomach cancer Maternal Uncle        dx in his 66s-80s   Stroke Maternal Uncle    Leukemia Paternal Uncle     Past Surgical History:  Procedure Laterality Date   APPENDECTOMY  1969   BUNIONECTOMY Bilateral    EYE SURGERY Bilateral    JOINT REPLACEMENT     KNEE ARTHROSCOPY Left  11/2013   Precancerous lesion excised     "arm"   TONSILLECTOMY  TOTAL ABDOMINAL HYSTERECTOMY  1990   TAH BSO   TOTAL HIP ARTHROPLASTY Left 07/20/2018   TOTAL HIP ARTHROPLASTY Left 07/20/2018   Procedure: LEFT TOTAL HIP ARTHROPLASTY ANTERIOR APPROACH;  Surgeon: Mcarthur Rossetti, MD;  Location: [place];  Service: Orthopedics;  Laterality: Left;   TOTAL KNEE ARTHROPLASTY Right 2012   TOTAL KNEE ARTHROPLASTY Left 04/03/2015   Procedure: TOTAL KNEE ARTHROPLASTY;  Surgeon: Garald Balding, MD;  Location: [place];  Service: Orthopedics;  Laterality: Left;   Social History   Occupational History   Not on file  Tobacco Use   Smoking status: Never   Smokeless tobacco: Never  Vaping Use   Vaping Use: Never used  Substance and Sexual Activity   Alcohol use: Yes    Alcohol/week: 1.0 - 2.0 standard drink of alcohol    Types: 1 - 2 Standard drinks or equivalent per week   Drug use: No   Sexual activity: Not Currently    Partners: Male    Birth control/protection: Surgical, Post-menopausal     Garald Balding, MD   Note - This record has been created using Editor, commissioning.  Chart creation errors have been sought, but may not always  have been located. Such creation errors do not reflect on  the standard of medical care.

## 2022-10-01 ENCOUNTER — Ambulatory Visit: Payer: PPO | Admitting: Orthopaedic Surgery

## 2022-10-08 ENCOUNTER — Ambulatory Visit (INDEPENDENT_AMBULATORY_CARE_PROVIDER_SITE_OTHER): Payer: PPO | Admitting: Orthopaedic Surgery

## 2022-10-08 ENCOUNTER — Encounter: Payer: Self-pay | Admitting: Orthopaedic Surgery

## 2022-10-08 DIAGNOSIS — Z96652 Presence of left artificial knee joint: Secondary | ICD-10-CM | POA: Diagnosis not present

## 2022-10-08 DIAGNOSIS — Z1231 Encounter for screening mammogram for malignant neoplasm of breast: Secondary | ICD-10-CM | POA: Diagnosis not present

## 2022-10-08 NOTE — Progress Notes (Signed)
Office Visit Note   Patient: Sharon [name]           Date of Birth: Feb 18, 1951           MRN: [id number] Visit Date: 10/08/2022              Requested by: Michael Boston, MD [address] [place],  [place] [address] PCP: Michael Boston, MD   Assessment & Plan: Visit Diagnoses:  1. History of left knee replacement     Plan: Mrs. Spinola follows up today for her left knee.  She is 8 years status post left total knee arthroplasty and has not had any problems.  About 3 weeks ago she began experiencing episodes of giving way of her left knee to the point where she was using a cane.  She denied any injury no history of fever chills or illness.  X-rays at her last visit did not demonstrate any loosening of the components.  She did not have an effusion or any warmth.  She has never had any bruising.  We have elected to treat this conservatively and she follows up today.  She is doing a lot better still has an occasional giving way of the knee but no longer needs her cane.  She has no evidence of any infective process no effusion no redness and her exam does not demonstrate any instability or pain.  She also has issues with her left foot and her orthotics are becoming painful and they are over a-year-old.  She is not sure if this caused her to ambulate differently causing the problems with her knee.  She is going to look into getting new orthotics.  Certainly the knee issue she has been having could be multifactorial coming from her back hip or foot with an altered gait.  She also has been on and off magnesium.  We discussed continue treating this conservatively since she is getting better.  She should do quadricep strengthening and she is aware of how to do this.  If she had continuing problems could consider a three-phase bone scan.  No longer using a cane.  Knee exam was benign without instability or local pain or effusion  Follow-Up Instructions: Return if symptoms worsen or fail to improve.   Orders:   No orders of the defined types were placed in this encounter.  No orders of the defined types were placed in this encounter.     Procedures: No procedures performed   Clinical Data: No additional findings.   Subjective: Chief Complaint  Patient presents with   Left Knee - Follow-up    HPI Sharon [name] comes in today for follow-up on her left knee.  3 weeks ago she began having giving way of her left knee.  She is 8 years status post left knee replacement.  Up until then she has been doing well.  She does feel she has improved she is no longer using a cane and only occasionally has giving way of her knee.  She does not have any real significant pain just a little soreness Review of Systems  All other systems reviewed and are negative.    Objective: Vital Signs: LMP 02/17/1989   Physical Exam Constitutional:      Appearance: Normal appearance.  Pulmonary:     Effort: Pulmonary effort is normal.  Skin:    General: Skin is warm and dry.  Neurological:     Mental Status: She is alert.     Ortho Exam  Examination of her left knee she has no warmth no erythema no effusion.  She has full extension.  She has good stability.  Her compartments are soft nontender.  She is not tender to palpation over the patellofemoral or the medial or lateral compartments.  No tenderness over the proximal tibia. Specialty Comments:  No specialty comments available.  Imaging: No results found.   PMFS History: Patient Active Problem List   Diagnosis Date Noted   Liver cyst 01/28/2022   Pain in left shoulder 01/10/2021   Neck pain, acute 04/11/2020   Asymptomatic varicose veins of bilateral lower extremities 02/09/2020   Functional dyspepsia 11/22/2019   Aortic valve disorder 11/17/2019   Family history of cancer 11/17/2019   Hyperlipidemia 11/17/2019   Lactose intolerance 11/17/2019   Migraine, unspecified, not intractable, without status migrainosus 11/17/2019   Pain in right ankle and  joints of right foot 07/26/2019   Status post total replacement of left hip 07/20/2018   Unilateral primary osteoarthritis, left hip 05/25/2018   Genetic testing 11/14/2016   Family history of ovarian cancer    Family history of colon cancer    Family history of uterine cancer    Family history of stomach cancer    Osteoarthritis of left knee 04/03/2015   History of left knee replacement 04/03/2015   Gait abnormality 12/22/2012   Swelling of ankle 12/22/2012   Hypertension    Headache(784.0)    Arthritis    Vitamin D deficiency    Past Medical History:  Diagnosis Date   Cataract    Family history of adverse reaction to anesthesia    "mom did; don't know what happened" (07/20/2018)   Family history of colon cancer    Family history of ovarian cancer    Family history of stomach cancer    Family history of uterine cancer    Fibroid    Heart murmur    "tiny, tiny one" (07/20/2018)   High cholesterol    Hypertension    Migraine    "a few/year; last one was 07/19/2018" (07/20/2018)   Osteoarthritis    "knees, hips, hands, probably in my back" (07/20/2018)   Ringworm    Vitamin D deficiency     Family History  Problem Relation Age of Onset   Ovarian cancer Mother 55   Heart disease Maternal Grandfather    Hypertension Paternal Grandmother    Ovarian cancer Cousin        maternal first cousin dx in her 46s   Heart disease Father    Atrial fibrillation Father    Heart disease Brother    Heart disease Brother    Colon cancer Maternal Aunt        dx in her 42s-60   Diabetes Paternal Aunt    Ovarian cancer Maternal Grandmother        dx in her mid 46s   Cancer Maternal Grandmother        OVARIAN AND UTERINE   Uterine cancer Maternal Grandmother        dx in her early 40s   Stomach cancer Maternal Uncle        dx in his 89s-80s   Stroke Maternal Uncle    Leukemia Paternal Uncle     Past Surgical History:  Procedure Laterality Date   APPENDECTOMY  1969   BUNIONECTOMY  Bilateral    EYE SURGERY Bilateral    JOINT REPLACEMENT     KNEE ARTHROSCOPY Left 11/2013   Precancerous lesion excised     "  arm"   TONSILLECTOMY     TOTAL ABDOMINAL HYSTERECTOMY  1990   TAH BSO   TOTAL HIP ARTHROPLASTY Left 07/20/2018   TOTAL HIP ARTHROPLASTY Left 07/20/2018   Procedure: LEFT TOTAL HIP ARTHROPLASTY ANTERIOR APPROACH;  Surgeon: Mcarthur Rossetti, MD;  Location: [facility];  Service: Orthopedics;  Laterality: Left;   TOTAL KNEE ARTHROPLASTY Right 2012   TOTAL KNEE ARTHROPLASTY Left 04/03/2015   Procedure: TOTAL KNEE ARTHROPLASTY;  Surgeon: Garald Balding, MD;  Location: [facility];  Service: Orthopedics;  Laterality: Left;   Social History   Occupational History   Not on file  Tobacco Use   Smoking status: Never   Smokeless tobacco: Never  Vaping Use   Vaping Use: Never used  Substance and Sexual Activity   Alcohol use: Yes    Alcohol/week: 1.0 - 2.0 standard drink of alcohol    Types: 1 - 2 Standard drinks or equivalent per week   Drug use: No   Sexual activity: Not Currently    Partners: Male    Birth control/protection: Surgical, Post-menopausal

## 2022-10-21 ENCOUNTER — Ambulatory Visit: Payer: PPO | Admitting: Orthopaedic Surgery

## 2022-11-27 DIAGNOSIS — N390 Urinary tract infection, site not specified: Secondary | ICD-10-CM | POA: Diagnosis not present

## 2022-12-03 DIAGNOSIS — H9202 Otalgia, left ear: Secondary | ICD-10-CM | POA: Diagnosis not present

## 2022-12-03 DIAGNOSIS — Z1152 Encounter for screening for COVID-19: Secondary | ICD-10-CM | POA: Diagnosis not present

## 2022-12-03 DIAGNOSIS — L821 Other seborrheic keratosis: Secondary | ICD-10-CM | POA: Diagnosis not present

## 2022-12-03 DIAGNOSIS — H65192 Other acute nonsuppurative otitis media, left ear: Secondary | ICD-10-CM | POA: Diagnosis not present

## 2022-12-03 DIAGNOSIS — R058 Other specified cough: Secondary | ICD-10-CM | POA: Diagnosis not present

## 2022-12-03 DIAGNOSIS — D1801 Hemangioma of skin and subcutaneous tissue: Secondary | ICD-10-CM | POA: Diagnosis not present

## 2022-12-03 DIAGNOSIS — R0981 Nasal congestion: Secondary | ICD-10-CM | POA: Diagnosis not present

## 2022-12-26 ENCOUNTER — Encounter: Payer: Self-pay | Admitting: Physical Medicine and Rehabilitation

## 2023-01-12 ENCOUNTER — Other Ambulatory Visit: Payer: Self-pay | Admitting: Physical Medicine and Rehabilitation

## 2023-01-12 DIAGNOSIS — M5416 Radiculopathy, lumbar region: Secondary | ICD-10-CM

## 2023-01-30 ENCOUNTER — Ambulatory Visit (INDEPENDENT_AMBULATORY_CARE_PROVIDER_SITE_OTHER): Payer: PPO | Admitting: Sports Medicine

## 2023-01-30 ENCOUNTER — Encounter: Payer: Self-pay | Admitting: Sports Medicine

## 2023-01-30 ENCOUNTER — Ambulatory Visit: Payer: PPO | Admitting: Physician Assistant

## 2023-01-30 DIAGNOSIS — M25562 Pain in left knee: Secondary | ICD-10-CM | POA: Diagnosis not present

## 2023-01-30 DIAGNOSIS — Z96652 Presence of left artificial knee joint: Secondary | ICD-10-CM | POA: Diagnosis not present

## 2023-01-30 DIAGNOSIS — G8929 Other chronic pain: Secondary | ICD-10-CM | POA: Diagnosis not present

## 2023-01-30 NOTE — Progress Notes (Signed)
Sharon [name] - 72 y.o. female MRN [id number]  Date of birth: 1951-02-07  Office Visit Note: Visit Date: 01/30/2023 PCP: Melida Quitter, MD Referred by: Melida Quitter, MD  Subjective: Chief Complaint  Patient presents with   Left Knee - Pain   HPI: Sharon [name] is a pleasant 72 y.o. female who presents today for chronic left knee pain.  She is status post left total knee arthroplasty about 8-9 years ago by Dr. Cleophas Dunker.  She has no pain on and off for a year or so.  Initially saw Dr. Cleophas Dunker back in the winter 2023.  She does state since that time her pain has improved but she still gets some pain and sensation of instability at times.  She has taken ibuprofen very infrequently.  Denies any swelling or redness about the knee.  She is leaving for her wife tomorrow for her trip and would like to be feeling well for this.  Pertinent ROS were reviewed with the patient and found to be negative unless otherwise specified above in HPI.   Assessment & Plan: Visit Diagnoses:  1. Chronic pain of left knee   2. Status post total left knee replacement using cement    Plan: Discussed with Dennie Bible today options for chronic left knee pain.  She is status post TKA back in about 2016.  She has had pain and some instability that has came and went for about the last 6 months or more.  She is no longer needing the assistance of a cane, but feels like the left knee still has some pain compared to the right which is perfectly fine.  Given the chronicity of this, we discussed proceeding with three-phase bone scan to evaluate for any arthroplasty or hardware malfunction, although suspicion for this is rather low.  She can get some GI upset with oral NSAIDs, did proceed with IM Toradol injection into the glute today.  Patient and use ice, Tylenol or over-the-counter anti-inflammatories as needed. Did recommend neoprene compression sleeve.  We will follow-up in 3 days after the three-phase bone scan to discuss  next steps.  Additional treatment considerations may be pes anserine bursa injection.  Follow-up: Return for F/u in 3 business days after bone scan to review.   Meds & Orders: No orders of the defined types were placed in this encounter.   Orders Placed This Encounter  Procedures   NM Bone Scan 3 Phase    - /mL of Toradol administered into the left superolateral glute today.   Procedures: No procedures performed      Clinical History: No specialty comments available.  She reports that she has never smoked. She has never used smokeless tobacco. No results for input(s): "HGBA1C", "LABURIC" in the last 8760 hours.  Objective:   Vital Signs: LMP 02/17/1989   Physical Exam  Gen: Well-appearing, in no acute distress; non-toxic CV: Well-perfused. Warm.  Resp: Breathing unlabored on room air; no wheezing. Psych: Fluid speech in conversation; appropriate affect; normal thought process Neuro: Sensation intact throughout. No gross coordination deficits.   Ortho Exam - Left knee: Evaluation of the knee demonstrates no swelling, redness or effusion.  Range of motion from about 0 to 105 degrees.  There is no instability with varus or valgus stress.  There is a mild TTP over the pes anserine bursa region.  Calves are soft and nontender bilaterally. Mild VMO atrophy.  Imaging:  XR KNEE 3 VIEW LEFT Films of the left total knee replacement obtained  in 3 projections.  There  is no obvious complication.  Surgery was performed in 2016.  Nice cement  mantle and both femoral and tibial components as well as the patella  component.  Joint spacing is symmetrical bilaterally   Past Medical/Family/Surgical/Social History: Medications & Allergies reviewed per EMR, new medications updated. Patient Active Problem List   Diagnosis Date Noted   Liver cyst 01/28/2022   Pain in left shoulder 01/10/2021   Neck pain, acute 04/11/2020   Asymptomatic varicose veins of bilateral lower extremities  02/09/2020   Functional dyspepsia 11/22/2019   Aortic valve disorder 11/17/2019   Family history of cancer 11/17/2019   Hyperlipidemia 11/17/2019   Lactose intolerance 11/17/2019   Migraine, unspecified, not intractable, without status migrainosus 11/17/2019   Pain in right ankle and joints of right foot 07/26/2019   Status post total replacement of left hip 07/20/2018   Unilateral primary osteoarthritis, left hip 05/25/2018   Genetic testing 11/14/2016   Family history of ovarian cancer    Family history of colon cancer    Family history of uterine cancer    Family history of stomach cancer    Osteoarthritis of left knee 04/03/2015   History of left knee replacement 04/03/2015   Gait abnormality 12/22/2012   Swelling of ankle 12/22/2012   Hypertension    Headache(784.0)    Arthritis    Vitamin D deficiency    Past Medical History:  Diagnosis Date   Cataract    Family history of adverse reaction to anesthesia    "mom did; don't know what happened" (07/20/2018)   Family history of colon cancer    Family history of ovarian cancer    Family history of stomach cancer    Family history of uterine cancer    Fibroid    Heart murmur    "tiny, tiny one" (07/20/2018)   High cholesterol    Hypertension    Migraine    "a few/year; last one was 07/19/2018" (07/20/2018)   Osteoarthritis    "knees, hips, hands, probably in my back" (07/20/2018)   Ringworm    Vitamin D deficiency    Family History  Problem Relation Age of Onset   Ovarian cancer Mother 51   Heart disease Maternal Grandfather    Hypertension Paternal Grandmother    Ovarian cancer Cousin        maternal first cousin dx in her 3s   Heart disease Father    Atrial fibrillation Father    Heart disease Brother    Heart disease Brother    Colon cancer Maternal Aunt        dx in her 71s-60   Diabetes Paternal Aunt    Ovarian cancer Maternal Grandmother        dx in her mid 41s   Cancer Maternal Grandmother         OVARIAN AND UTERINE   Uterine cancer Maternal Grandmother        dx in her early 74s   Stomach cancer Maternal Uncle        dx in his 66s-80s   Stroke Maternal Uncle    Leukemia Paternal Uncle    Past Surgical History:  Procedure Laterality Date   APPENDECTOMY  1969   BUNIONECTOMY Bilateral    EYE SURGERY Bilateral    JOINT REPLACEMENT     KNEE ARTHROSCOPY Left 11/2013   Precancerous lesion excised     "arm"   TONSILLECTOMY     TOTAL ABDOMINAL HYSTERECTOMY  1990  TAH BSO   TOTAL HIP ARTHROPLASTY Left 07/20/2018   TOTAL HIP ARTHROPLASTY Left 07/20/2018   Procedure: LEFT TOTAL HIP ARTHROPLASTY ANTERIOR APPROACH;  Surgeon: Kathryne Hitch, MD;  Location: [facility];  Service: Orthopedics;  Laterality: Left;   TOTAL KNEE ARTHROPLASTY Right 2012   TOTAL KNEE ARTHROPLASTY Left 04/03/2015   Procedure: TOTAL KNEE ARTHROPLASTY;  Surgeon: Valeria Batman, MD;  Location: [facility] OR;  Service: Orthopedics;  Laterality: Left;   Social History   Occupational History   Not on file  Tobacco Use   Smoking status: Never   Smokeless tobacco: Never  Vaping Use   Vaping Use: Never used  Substance and Sexual Activity   Alcohol use: Yes    Alcohol/week: 1.0 - 2.0 standard drink of alcohol    Types: 1 - 2 Standard drinks or equivalent per week   Drug use: No   Sexual activity: Not Currently    Partners: Male    Birth control/protection: Surgical, Post-menopausal

## 2023-02-11 ENCOUNTER — Encounter (HOSPITAL_COMMUNITY)
Admission: RE | Admit: 2023-02-11 | Discharge: 2023-02-11 | Disposition: A | Discharge status: Home / Self Care | Payer: PPO | Source: Ambulatory Visit | Attending: Sports Medicine | Admitting: Sports Medicine

## 2023-02-11 DIAGNOSIS — Z96653 Presence of artificial knee joint, bilateral: Secondary | ICD-10-CM | POA: Diagnosis not present

## 2023-02-11 DIAGNOSIS — G8929 Other chronic pain: Secondary | ICD-10-CM | POA: Insufficient documentation

## 2023-02-11 DIAGNOSIS — M25562 Pain in left knee: Secondary | ICD-10-CM | POA: Diagnosis not present

## 2023-02-11 MED ORDER — TECHNETIUM TC 99M MEDRONATE IV KIT
19.8000 | PACK | Freq: Once | INTRAVENOUS | Status: AC | PRN
  Administered 2023-02-11: 19.8 via INTRAVENOUS

## 2023-02-13 ENCOUNTER — Telehealth: Payer: Self-pay | Admitting: Physical Medicine and Rehabilitation

## 2023-02-13 NOTE — Telephone Encounter (Signed)
Patient called. Returning a call to schedule with Dr. Newton.  ?

## 2023-02-16 NOTE — Telephone Encounter (Signed)
Spoke with patient and scheduled injection for 02/26/23. Patient aware driver needed 

## 2023-02-18 DIAGNOSIS — R7989 Other specified abnormal findings of blood chemistry: Secondary | ICD-10-CM | POA: Diagnosis not present

## 2023-02-18 DIAGNOSIS — I10 Essential (primary) hypertension: Secondary | ICD-10-CM | POA: Diagnosis not present

## 2023-02-18 DIAGNOSIS — E785 Hyperlipidemia, unspecified: Secondary | ICD-10-CM | POA: Diagnosis not present

## 2023-02-18 DIAGNOSIS — R7301 Impaired fasting glucose: Secondary | ICD-10-CM | POA: Diagnosis not present

## 2023-02-18 DIAGNOSIS — E559 Vitamin D deficiency, unspecified: Secondary | ICD-10-CM | POA: Diagnosis not present

## 2023-02-20 DIAGNOSIS — M67911 Unspecified disorder of synovium and tendon, right shoulder: Secondary | ICD-10-CM | POA: Diagnosis not present

## 2023-02-23 ENCOUNTER — Telehealth: Payer: Self-pay | Admitting: Sports Medicine

## 2023-02-23 NOTE — Telephone Encounter (Signed)
Patient had a MRI on 4/24 and is advising no one has gave her the results. She however does have an appointment with Dr. Alvester Morin, on Thursday and is asking if someone could tell her then. Please advise

## 2023-02-25 ENCOUNTER — Encounter: Payer: Self-pay | Admitting: Sports Medicine

## 2023-02-25 DIAGNOSIS — Z6827 Body mass index (BMI) 27.0-27.9, adult: Secondary | ICD-10-CM | POA: Diagnosis not present

## 2023-02-25 DIAGNOSIS — E785 Hyperlipidemia, unspecified: Secondary | ICD-10-CM | POA: Diagnosis not present

## 2023-02-25 DIAGNOSIS — Z0000 Encounter for general adult medical examination without abnormal findings: Secondary | ICD-10-CM | POA: Diagnosis not present

## 2023-02-25 DIAGNOSIS — Z1331 Encounter for screening for depression: Secondary | ICD-10-CM | POA: Diagnosis not present

## 2023-02-25 DIAGNOSIS — K30 Functional dyspepsia: Secondary | ICD-10-CM | POA: Diagnosis not present

## 2023-02-25 DIAGNOSIS — Z1339 Encounter for screening examination for other mental health and behavioral disorders: Secondary | ICD-10-CM | POA: Diagnosis not present

## 2023-02-25 DIAGNOSIS — E663 Overweight: Secondary | ICD-10-CM | POA: Diagnosis not present

## 2023-02-25 DIAGNOSIS — I10 Essential (primary) hypertension: Secondary | ICD-10-CM | POA: Diagnosis not present

## 2023-02-26 ENCOUNTER — Ambulatory Visit: Payer: PPO | Admitting: Physical Medicine and Rehabilitation

## 2023-02-26 ENCOUNTER — Encounter: Payer: Self-pay | Admitting: Physical Medicine and Rehabilitation

## 2023-02-26 ENCOUNTER — Other Ambulatory Visit: Payer: Self-pay

## 2023-02-26 VITALS — BP 109/68 | HR 64

## 2023-02-26 DIAGNOSIS — M5416 Radiculopathy, lumbar region: Secondary | ICD-10-CM

## 2023-02-26 MED ORDER — METHYLPREDNISOLONE ACETATE 80 MG/ML IJ SUSP
80.0000 mg | Freq: Once | INTRAMUSCULAR | Status: AC
  Administered 2023-02-26: 80 mg

## 2023-02-26 NOTE — Progress Notes (Signed)
Functional Pain Scale - descriptive words and definitions  Uncomfortable (3)  Pain is present but can complete all ADL's/sleep is slightly affected and passive distraction only gives marginal relief. Mild range order  Average Pain 2-3   +Driver, -BT, -Dye Allergies.  Lower back pain on right that can radiate into the side of the right leg

## 2023-02-26 NOTE — Patient Instructions (Signed)

## 2023-03-10 NOTE — Progress Notes (Signed)
Sharon [name] - 72 y.o. female MRN [id number]  Date of birth: [birth year]  Office Visit Note: Visit Date: 02/26/2023 PCP: Melida Quitter, MD Referred by: Melida Quitter, MD  Subjective: Chief Complaint  Patient presents with   Lower Back - Pain   HPI:  Sharon [name] is a 72 y.o. female who comes in today for planned repeat Right L5-S1  Lumbar Interlaminar epidural steroid injection with fluoroscopic guidance.  The patient has failed conservative care including home exercise, medications, time and activity modification.  This injection will be diagnostic and hopefully therapeutic.  Please see requesting physician notes for further details and justification. Patient received more than 50% pain relief from prior injection.   Referring: Ellin Goodie, FNP and Dr. Norlene Campbell   ROS Otherwise per HPI.  Assessment & Plan: Visit Diagnoses:    ICD-10-CM   1. Lumbar radiculopathy  M54.16 XR C-ARM NO REPORT    Epidural Steroid injection    methylPREDNISolone acetate (DEPO-MEDROL) injection 80 mg      Plan: No additional findings.   Meds & Orders:  Meds ordered this encounter  Medications   methylPREDNISolone acetate (DEPO-MEDROL) injection 80 mg    Orders Placed This Encounter  Procedures   XR C-ARM NO REPORT   Epidural Steroid injection    Follow-up: Return for visit to requesting provider as needed.   Procedures: No procedures performed  Lumbar Epidural Steroid Injection - Interlaminar Approach with Fluoroscopic Guidance  Patient: Sharon [name]      Date of Birth: 1951/10/02 MRN: [id number] PCP: Melida Quitter, MD      Visit Date: 02/26/2023   Universal Protocol:     Consent Given By: the patient  Position: PRONE  Additional Comments: Vital signs were monitored before and after the procedure. Patient was prepped and draped in the usual sterile fashion. The correct patient, procedure, and site was verified.   Injection Procedure Details:   Procedure  diagnoses: Lumbar radiculopathy [M54.16]   Meds Administered:  Meds ordered this encounter  Medications   methylPREDNISolone acetate (DEPO-MEDROL) injection 80 mg     Laterality: Right  Location/Site:  L5-S1  Needle: 3.5 in., 20 ga. Tuohy  Needle Placement: Paramedian epidural  Findings:   -Comments: Excellent flow of contrast into the epidural space.  Procedure Details: Using a paramedian approach from the side mentioned above, the region overlying the inferior lamina was localized under fluoroscopic visualization and the soft tissues overlying this structure were infiltrated with 4 ml. of 1% Lidocaine without Epinephrine. The Tuohy needle was inserted into the epidural space using a paramedian approach.   The epidural space was localized using loss of resistance along with counter oblique bi-planar fluoroscopic views.  After negative aspirate for air, blood, and CSF, a 2 ml. volume of Isovue-250 was injected into the epidural space and the flow of contrast was observed. Radiographs were obtained for documentation purposes.    The injectate was administered into the level noted above.   Additional Comments:  No complications occurred Dressing: 2 x 2 sterile gauze and Band-Aid    Post-procedure details: Patient was observed during the procedure. Post-procedure instructions were reviewed.  Patient left the clinic in stable condition.   Clinical History: No specialty comments available.     Objective:  VS:  HT:    WT:   BMI:     BP:109/68  HR:64bpm  TEMP: ( )  RESP:  Physical Exam Vitals and nursing note reviewed.  Constitutional:  General: She is not in acute distress.    Appearance: Normal appearance. She is not ill-appearing.  HENT:     Head: Normocephalic and atraumatic.     Right Ear: External ear normal.     Left Ear: External ear normal.  Eyes:     Extraocular Movements: Extraocular movements intact.  Cardiovascular:     Rate and Rhythm: Normal  rate.     Pulses: Normal pulses.  Pulmonary:     Effort: Pulmonary effort is normal. No respiratory distress.  Abdominal:     General: There is no distension.     Palpations: Abdomen is soft.  Musculoskeletal:        General: Tenderness present.     Cervical back: Neck supple.     Right lower leg: No edema.     Left lower leg: No edema.     Comments: Patient has good distal strength with no pain over the greater trochanters.  No clonus or focal weakness.  Skin:    Findings: No erythema, lesion or rash.  Neurological:     General: No focal deficit present.     Mental Status: She is alert and oriented to person, place, and time.     Sensory: No sensory deficit.     Motor: No weakness or abnormal muscle tone.     Coordination: Coordination normal.  Psychiatric:        Mood and Affect: Mood normal.        Behavior: Behavior normal.      Imaging: No results found.

## 2023-03-10 NOTE — Procedures (Signed)
Lumbar Epidural Steroid Injection - Interlaminar Approach with Fluoroscopic Guidance  Patient: Sharon [name]      Date of Birth: 10-12-1951 MRN: [id number] PCP: Melida Quitter, MD      Visit Date: 02/26/2023   Universal Protocol:     Consent Given By: the patient  Position: PRONE  Additional Comments: Vital signs were monitored before and after the procedure. Patient was prepped and draped in the usual sterile fashion. The correct patient, procedure, and site was verified.   Injection Procedure Details:   Procedure diagnoses: Lumbar radiculopathy [M54.16]   Meds Administered:  Meds ordered this encounter  Medications   methylPREDNISolone acetate (DEPO-MEDROL) injection 80 mg     Laterality: Right  Location/Site:  L5-S1  Needle: 3.5 in., 20 ga. Tuohy  Needle Placement: Paramedian epidural  Findings:   -Comments: Excellent flow of contrast into the epidural space.  Procedure Details: Using a paramedian approach from the side mentioned above, the region overlying the inferior lamina was localized under fluoroscopic visualization and the soft tissues overlying this structure were infiltrated with 4 ml. of 1% Lidocaine without Epinephrine. The Tuohy needle was inserted into the epidural space using a paramedian approach.   The epidural space was localized using loss of resistance along with counter oblique bi-planar fluoroscopic views.  After negative aspirate for air, blood, and CSF, a 2 ml. volume of Isovue-250 was injected into the epidural space and the flow of contrast was observed. Radiographs were obtained for documentation purposes.    The injectate was administered into the level noted above.   Additional Comments:  No complications occurred Dressing: 2 x 2 sterile gauze and Band-Aid    Post-procedure details: Patient was observed during the procedure. Post-procedure instructions were reviewed.  Patient left the clinic in stable condition.

## 2023-03-11 ENCOUNTER — Encounter: Payer: Self-pay | Admitting: Orthopaedic Surgery

## 2023-03-11 ENCOUNTER — Ambulatory Visit (INDEPENDENT_AMBULATORY_CARE_PROVIDER_SITE_OTHER): Payer: PPO | Admitting: Orthopaedic Surgery

## 2023-03-11 DIAGNOSIS — T84033D Mechanical loosening of internal left knee prosthetic joint, subsequent encounter: Secondary | ICD-10-CM | POA: Diagnosis not present

## 2023-03-11 DIAGNOSIS — M25562 Pain in left knee: Secondary | ICD-10-CM

## 2023-03-11 DIAGNOSIS — G8929 Other chronic pain: Secondary | ICD-10-CM

## 2023-03-11 DIAGNOSIS — Z96652 Presence of left artificial knee joint: Secondary | ICD-10-CM

## 2023-03-11 NOTE — Progress Notes (Signed)
The patient is still not seen before.  We actually replaced the hip before.  She does have a history of both her knees being replaced by my partner Dr. Cleophas Dunker.  Her left knee was replaced in June 2016.  She had been having some pain and instability symptoms of that left knee for some time now.  She occasionally does wear knee brace and has to get around with a cane on occasion.  She has no issues with the right knee.  She has had no recent illnesses.  Three-phase bone scan was performed and it does show uptake around the tibial component suggesting aseptic loosening.  On examination of her left knee today there is some slight play in the ligaments of the knee but it is only slight.  There is no effusion today with good range of motion of that knee.  I did go over previous plain films and the bone scan of her knee.  Certainly there is a lot of uptake around the tibial tray and it does look like there is some evidence of loosening on the plain film as well.  I did show her and her husband a knee model and are recommending a revision arthroplasty of that left knee given her signs and symptoms, clinical exam findings and radiographic findings.  I described in detail what revision surgery involves.  We had a long and thorough discussion about the risks and benefits of the surgery as well and what to expect from an intraoperative and postoperative course.  All question concerns were answered and addressed.  She does understand our recommendation for surgery and is fine with [place] working on getting this scheduled later in the summer.

## 2023-03-23 ENCOUNTER — Ambulatory Visit: Payer: PPO | Admitting: Orthopaedic Surgery

## 2023-03-25 DIAGNOSIS — M67911 Unspecified disorder of synovium and tendon, right shoulder: Secondary | ICD-10-CM | POA: Diagnosis not present

## 2023-03-31 ENCOUNTER — Other Ambulatory Visit (HOSPITAL_BASED_OUTPATIENT_CLINIC_OR_DEPARTMENT_OTHER): Payer: Self-pay

## 2023-03-31 ENCOUNTER — Ambulatory Visit (INDEPENDENT_AMBULATORY_CARE_PROVIDER_SITE_OTHER): Payer: PPO | Admitting: Student

## 2023-03-31 ENCOUNTER — Other Ambulatory Visit (HOSPITAL_BASED_OUTPATIENT_CLINIC_OR_DEPARTMENT_OTHER): Payer: Self-pay | Admitting: Student

## 2023-03-31 ENCOUNTER — Ambulatory Visit (HOSPITAL_BASED_OUTPATIENT_CLINIC_OR_DEPARTMENT_OTHER): Payer: PPO

## 2023-03-31 DIAGNOSIS — Z96642 Presence of left artificial hip joint: Secondary | ICD-10-CM

## 2023-03-31 DIAGNOSIS — M25552 Pain in left hip: Secondary | ICD-10-CM

## 2023-03-31 DIAGNOSIS — M25551 Pain in right hip: Secondary | ICD-10-CM | POA: Diagnosis not present

## 2023-03-31 MED ORDER — METHOCARBAMOL 500 MG PO TABS
500.0000 mg | ORAL_TABLET | Freq: Four times a day (QID) | ORAL | 0 refills | Status: AC
  Filled 2023-03-31: qty 40, 10d supply, fill #0

## 2023-03-31 NOTE — Progress Notes (Signed)
Chief Complaint: Bilateral hip pain     History of Present Illness:    Sharon [name] is a 72 y.o. female presenting today for evaluation of pain in both hips.  She does have history of a left total hip arthroplasty in 2019 as well as lumbar radiculopathy.  She states that approximately 5 days ago she fell out of her bed onto the floor while asleep.  Patient landed on her buttocks both knees extended.  She reports that initially her left hip was sore, however as of the last day or 2 the soreness has become more bothersome in her right hip.  Most of the discomfort is in the lateral hips and posteriorly toward her low back.  Overall the pain is mild to moderate depending on activity level.  She states that she was able to walk around outside this weekend at a local music event.  She also notes some soreness and stiffness in her cervical spine.  Reports that she has been taking Excedrin as needed for headaches as well as ibuprofen.  Denies any groin pain, numbness, or tingling.   Surgical History:   Left THA 2019 Right TKA 2012 Left TKA 2016  PMH/PSH/Family History/Social History/Meds/Allergies:    Past Medical History:  Diagnosis Date   Cataract    Family history of adverse reaction to anesthesia    "mom did; don't know what happened" (07/20/2018)   Family history of colon cancer    Family history of ovarian cancer    Family history of stomach cancer    Family history of uterine cancer    Fibroid    Heart murmur    "tiny, tiny one" (07/20/2018)   High cholesterol    Hypertension    Migraine    "a few/year; last one was 07/19/2018" (07/20/2018)   Osteoarthritis    "knees, hips, hands, probably in my back" (07/20/2018)   Ringworm    Vitamin D deficiency    Past Surgical History:  Procedure Laterality Date   APPENDECTOMY  1969   BUNIONECTOMY Bilateral    EYE SURGERY Bilateral    JOINT REPLACEMENT     KNEE ARTHROSCOPY Left 11/2013    Precancerous lesion excised     "arm"   TONSILLECTOMY     TOTAL ABDOMINAL HYSTERECTOMY  1990   TAH BSO   TOTAL HIP ARTHROPLASTY Left 07/20/2018   TOTAL HIP ARTHROPLASTY Left 07/20/2018   Procedure: LEFT TOTAL HIP ARTHROPLASTY ANTERIOR APPROACH;  Surgeon: Kathryne Hitch, MD;  Location: [facility] OR;  Service: Orthopedics;  Laterality: Left;   TOTAL KNEE ARTHROPLASTY Right 2012   TOTAL KNEE ARTHROPLASTY Left 04/03/2015   Procedure: TOTAL KNEE ARTHROPLASTY;  Surgeon: Valeria Batman, MD;  Location: [facility] OR;  Service: Orthopedics;  Laterality: Left;   Social History   Socioeconomic History   Marital status: Married    Spouse name: Not on file   Number of children: Not on file   Years of education: Not on file   Highest education level: Not on file  Occupational History   Not on file  Tobacco Use   Smoking status: Never   Smokeless tobacco: Never  Vaping Use   Vaping Use: Never used  Substance and Sexual Activity   Alcohol use: Yes    Alcohol/week: 1.0 - 2.0 standard drink of alcohol  Types: 1 - 2 Standard drinks or equivalent per week   Drug use: No   Sexual activity: Not Currently    Partners: Male    Birth control/protection: Surgical, Post-menopausal  Other Topics Concern   Not on file  Social History Narrative   Not on file   Social Determinants of Health   Financial Resource Strain: Not on file  Food Insecurity: Not on file  Transportation Needs: Not on file  Physical Activity: Not on file  Stress: Not on file  Social Connections: Not on file   Family History  Problem Relation Age of Onset   Ovarian cancer Mother 55   Heart disease Maternal Grandfather    Hypertension Paternal Grandmother    Ovarian cancer Cousin        maternal first cousin dx in her 62s   Heart disease Father    Atrial fibrillation Father    Heart disease Brother    Heart disease Brother    Colon cancer Maternal Aunt        dx in her 35s-60   Diabetes Paternal Aunt    Ovarian  cancer Maternal Grandmother        dx in her mid 78s   Cancer Maternal Grandmother        OVARIAN AND UTERINE   Uterine cancer Maternal Grandmother        dx in her early 37s   Stomach cancer Maternal Uncle        dx in his 6s-80s   Stroke Maternal Uncle    Leukemia Paternal Uncle    Allergies  Allergen Reactions   Cefuroxime Axetil     REACTION: rash   Codeine Other (See Comments)    headache   Pollen Extract Other (See Comments)    Blisters   Tape Other (See Comments)    Blisters Other reaction(s): Unknown   Ceftin Rash   Current Outpatient Medications  Medication Sig Dispense Refill   methocarbamol (ROBAXIN) 500 MG tablet Take 1 tablet (500 mg total) by mouth 4 (four) times daily for 10 days. 40 tablet 0   Calcium Carbonate-Vitamin D (CALCIUM 500 + D PO) Take 2 each by mouth daily. Gummies     Cholecalciferol (VITAMIN D3) 25 MCG (1000 UT) CAPS Take by mouth.     cycloSPORINE (RESTASIS) 0.05 % ophthalmic emulsion Place 1 drop into both eyes 2 (two) times daily.      diclofenac Sodium (VOLTAREN) 1 % GEL Apply 2 g topically 4 (four) times daily. 2 g 1   fluticasone (FLONASE) 50 MCG/ACT nasal spray Place 1 spray into both nostrils daily as needed for allergies (spring/fall).     hydrochlorothiazide (HYDRODIURIL) 25 MG tablet Take 25 mg by mouth daily.     methocarbamol (ROBAXIN) 500 MG tablet TAKE 1 TABLET(500 MG) BY MOUTH TWICE DAILY AS NEEDED FOR MUSCLE SPASMS 30 tablet 1   SUMAtriptan (IMITREX) 100 MG tablet Take 100 mg by mouth as needed for migraine or headache.      verapamil (COVERA HS) 240 MG (CO) 24 hr tablet Take 240 mg by mouth 2 (two) times daily.      No current facility-administered medications for this visit.   No results found.  Review of Systems:   A ROS was performed including pertinent positives and negatives as documented in the HPI.  Physical Exam :   Constitutional: NAD and appears stated age Neurological: Alert and oriented Psych: Appropriate  affect and cooperative Last menstrual period 02/17/1989.   Comprehensive  Musculoskeletal Exam:    Patient is tender to palpation over spinal L4-S1 levels as well as bilateral SI joints.  No pain over greater trochanters bilaterally.  Full passive range of motion of bilateral hips with flexion, external rotation, and internal rotation.  Neurosensory exam intact.  Imaging:   Xray (Bilateral hips and AP pelvis): No evidence of fracture or dislocation.  Left total hip arthroplasty components are well-appearing and show no notable changes compared to previous xray.  Mild to moderate osteoarthritis of the right hip.  I personally reviewed and interpreted the radiographs.   Assessment:   72 y.o. female with bilateral soreness of the hips after a fall out of bed a few days ago.  Both hips including left THA components are well-appearing on xray without evidence of acute injury.  The fall did seem to flareup her low back which had been feeling fairly well after a L5-S1 injection 1 month ago with Dr. Alvester Morin.  She does also report some mild stiffness of the cervical spine which I suspect is due to the fall.  I will send her in some Robaxin to help with this which she has tolerated well in the past.  Encouraged to continue with Advil for pain control.  Discussed future follow-up should symptoms worsen or fail to improve after a few weeks of conservative management.  Plan :    -Follow-up in clinic as needed     I personally saw and evaluated the patient, and participated in the management and treatment plan.  Hazle Nordmann, PA-C Orthopedics  This document was dictated using Conservation officer, historic buildings. A reasonable attempt at proof reading has been made to minimize errors.

## 2023-04-20 ENCOUNTER — Encounter: Payer: Self-pay | Admitting: Nurse Practitioner

## 2023-04-20 ENCOUNTER — Ambulatory Visit (INDEPENDENT_AMBULATORY_CARE_PROVIDER_SITE_OTHER): Payer: PPO | Admitting: Nurse Practitioner

## 2023-04-20 VITALS — BP 124/82 | HR 76 | Wt 170.0 lb

## 2023-04-20 DIAGNOSIS — L02215 Cutaneous abscess of perineum: Secondary | ICD-10-CM

## 2023-04-20 MED ORDER — SULFAMETHOXAZOLE-TRIMETHOPRIM 400-80 MG PO TABS
1.0000 | ORAL_TABLET | Freq: Two times a day (BID) | ORAL | 0 refills | Status: AC
Start: 2023-04-20 — End: 2023-04-30

## 2023-04-20 NOTE — Progress Notes (Signed)
   Acute Office Visit  Subjective:    Patient ID: Sharon [name], female    DOB: 05-19-1951, 72 y.o.   MRN: [id number]   HPI 72 y.o. presents today for vaginal bump x 4 days. Mild tenderness with sitting/pressure. Thinks is may have drained a little bit. Slight increase in size. Has been applying Neosporin.   Patient's last menstrual period was 02/17/1989.    Review of Systems  Constitutional: Negative.   Genitourinary:  Positive for genital sores.       Objective:    Physical Exam Constitutional:      Appearance: Normal appearance.  Genitourinary:      BP 124/82   Pulse 76   Wt 170 lb (77.1 kg)   LMP 02/17/1989   SpO2 100%   BMI 28.29 kg/m  Wt Readings from Last 3 Encounters:  04/20/23 170 lb (77.1 kg)  01/28/22 168 lb 3.2 oz (76.3 kg)  05/14/21 162 lb (73.5 kg)        Patient informed chaperone available to be present for breast and/or pelvic exam. Patient has requested no chaperone to be present. Patient has been advised what will be completed during breast and pelvic exam.   Assessment & Plan:   Problem List Items Addressed This Visit   None Visit Diagnoses     Perineal abscess    -  Primary   Relevant Medications   sulfamethoxazole-trimethoprim (BACTRIM) 400-80 MG tablet      Plan: Bactrim 400-80 mg BID x 10 days. Warm compresses, triple antibiotic ointment, Ibuprofen as needed. Return if symptoms worsen or do not improve.      Olivia Mackie DNP, 12:35 PM 04/20/2023

## 2023-05-05 DIAGNOSIS — K30 Functional dyspepsia: Secondary | ICD-10-CM | POA: Diagnosis not present

## 2023-05-05 DIAGNOSIS — I10 Essential (primary) hypertension: Secondary | ICD-10-CM | POA: Diagnosis not present

## 2023-05-05 DIAGNOSIS — M549 Dorsalgia, unspecified: Secondary | ICD-10-CM | POA: Diagnosis not present

## 2023-05-05 DIAGNOSIS — K5790 Diverticulosis of intestine, part unspecified, without perforation or abscess without bleeding: Secondary | ICD-10-CM | POA: Diagnosis not present

## 2023-05-05 DIAGNOSIS — R1032 Left lower quadrant pain: Secondary | ICD-10-CM | POA: Diagnosis not present

## 2023-05-08 NOTE — Pre-Procedure Instructions (Signed)
Surgical Instructions    Your procedure is scheduled on June 09, 2023.  Report to [facility] Main Entrance "A" at 10:45 A.M., then check in with the Admitting office.  Call this number if you have problems the morning of surgery:  [phone]   If you have any questions prior to your surgery date call [phone]: Open Monday-Friday 8am-4pm If you experience any cold or flu symptoms such as cough, fever, chills, shortness of breath, etc. between now and your scheduled surgery, please notify us at the above number     Remember:  Do not eat after midnight the night before your surgery  You may drink clear liquids until 9:45 AM the morning of your surgery.   Clear liquids allowed are: Water, Non-Citrus Juices (without pulp), Carbonated Beverages, Clear Tea, Black Coffee ONLY (NO MILK, CREAM OR POWDERED CREAMER of any kind), and Gatorade    Take these medicines the morning of surgery with A SIP OF WATER:  cycloSPORINE (RESTASIS) 0.05 % ophthalmic emulsion  verapamil (COVERA HS)   If needed: fluticasone (FLONASE) 50 MCG/ACT nasal spray  methocarbamol (ROBAXIN)  SUMAtriptan (IMITREX)   As of today, STOP taking any Aspirin (unless otherwise instructed by your surgeon) Aleve, Naproxen, Ibuprofen, Motrin, Advil, Goody's, BC's, all herbal medications, fish oil, and all vitamins.            [facility] is not responsible for any belongings or valuables.    Do NOT Smoke (Tobacco/Vaping)  24 hours prior to your procedure  If you use a CPAP at night, you may bring your mask for your overnight stay.   Contacts, glasses, hearing aids, dentures or partials may not be worn into surgery, please bring cases for these belongings   For patients admitted to the hospital, discharge time will be determined by your treatment team.   Patients discharged the day of surgery will not be allowed to drive home, and someone needs to stay with them for 24 hours.   SURGICAL WAITING ROOM  VISITATION Patients having surgery or a procedure may have no more than 2 support people in the waiting area - these visitors may rotate.   Children under the age of 28 must have an adult with them who is not the patient. If the patient needs to stay at the hospital during part of their recovery, the visitor guidelines for inpatient rooms apply. Pre-op nurse will coordinate an appropriate time for 1 support person to accompany patient in pre-op.  This support person may not rotate.   Please refer to [email or web address] for the visitor guidelines for Inpatients (after your surgery is over and you are in a regular room).      Pre-operative 5 CHG Bath Instructions   You can play a key role in reducing the risk of infection after surgery. Your skin needs to be as free of germs as possible. You can reduce the number of germs on your skin by washing with CHG (chlorhexidine gluconate) soap before surgery. CHG is an antiseptic soap that kills germs and continues to kill germs even after washing.   DO NOT use if you have an allergy to chlorhexidine/CHG or antibacterial soaps. If your skin becomes reddened or irritated, stop using the CHG and notify one of our RNs at [phone].   Please shower with the CHG soap starting 4 days before surgery using the following schedule:     Please keep in mind the following:  DO NOT shave, including legs and underarms, starting the day of  your first shower.   You may shave your face at any point before/day of surgery.  Place clean sheets on your bed the day you start using CHG soap. Use a clean washcloth (not used since being washed) for each shower. DO NOT sleep with pets once you start using the CHG.   CHG Shower Instructions:  If you choose to wash your hair and private area, wash first with your normal shampoo/soap.  After you use shampoo/soap, rinse your hair and body thoroughly to remove shampoo/soap  residue.  Turn the water OFF and apply about 3 tablespoons (45 ml) of CHG soap to a CLEAN washcloth.  Apply CHG soap ONLY FROM YOUR NECK DOWN TO YOUR TOES (washing for 3-5 minutes)  DO NOT use CHG soap on face, private areas, open wounds, or sores.  Pay special attention to the area where your surgery is being performed.  If you are having back surgery, having someone wash your back for you may be helpful. Wait 2 minutes after CHG soap is applied, then you may rinse off the CHG soap.  Pat dry with a clean towel  Put on clean clothes/pajamas   If you choose to wear lotion, please use ONLY the CHG-compatible lotions on the back of this paper.     Additional instructions for the day of surgery: DO NOT APPLY any lotions, deodorants, cologne, or perfumes.   Put on clean/comfortable clothes.  Brush your teeth.  Ask your nurse before applying any prescription medications to the skin.      CHG Compatible Lotions   Aveeno Moisturizing lotion  Cetaphil Moisturizing Cream  Cetaphil Moisturizing Lotion  Clairol Herbal Essence Moisturizing Lotion, Dry Skin  Clairol Herbal Essence Moisturizing Lotion, Extra Dry Skin  Clairol Herbal Essence Moisturizing Lotion, Normal Skin  Curel Age Defying Therapeutic Moisturizing Lotion with Alpha Hydroxy  Curel Extreme Care Body Lotion  Curel Soothing Hands Moisturizing Hand Lotion  Curel Therapeutic Moisturizing Cream, Fragrance-Free  Curel Therapeutic Moisturizing Lotion, Fragrance-Free  Curel Therapeutic Moisturizing Lotion, Original Formula  Eucerin Daily Replenishing Lotion  Eucerin Dry Skin Therapy Plus Alpha Hydroxy Crme  Eucerin Dry Skin Therapy Plus Alpha Hydroxy Lotion  Eucerin Original Crme  Eucerin Original Lotion  Eucerin Plus Crme Eucerin Plus Lotion  Eucerin TriLipid Replenishing Lotion  Keri Anti-Bacterial Hand Lotion  Keri Deep Conditioning Original Lotion Dry Skin Formula Softly Scented  Keri Deep Conditioning Original Lotion,  Fragrance Free Sensitive Skin Formula  Keri Lotion Fast Absorbing Fragrance Free Sensitive Skin Formula  Keri Lotion Fast Absorbing Softly Scented Dry Skin Formula  Keri Original Lotion  Keri Skin Renewal Lotion Keri Silky Smooth Lotion  Keri Silky Smooth Sensitive Skin Lotion  Nivea Body Creamy Conditioning Oil  Nivea Body Extra Enriched Lotion  Nivea Body Original Lotion  Nivea Body Sheer Moisturizing Lotion Nivea Crme  Nivea Skin Firming Lotion  NutraDerm 30 Skin Lotion  NutraDerm Skin Lotion  NutraDerm Therapeutic Skin Cream  NutraDerm Therapeutic Skin Lotion  ProShield Protective Hand Cream  Provon moisturizing lotion    Day of Surgery:  Take a shower with CHG soap. Wear Clean/Comfortable clothing the morning of surgery Do not wear jewelry or makeup. Do not wear lotions, powders, perfumes/cologne or deodorant. Do not shave 48 hours prior to surgery.  Men may shave face and neck. Do not bring valuables to the hospital. Do not wear nail polish, gel polish, artificial nails, or any other type of covering on natural nails (fingers and toes) If you have artificial nails or  gel coating that need to be removed by a nail salon, please have this removed prior to surgery. Artificial nails or gel coating may interfere with anesthesia's ability to adequately monitor your vital signs. Remember to brush your teeth WITH YOUR REGULAR TOOTHPASTE.    If you received a COVID test during your pre-op visit, it is requested that you wear a mask when out in public, stay away from anyone that may not be feeling well, and notify your surgeon if you develop symptoms. If you have been in contact with anyone that has tested positive in the last 10 days, please notify your surgeon.    Please read over the following fact sheets that you were given.

## 2023-05-11 ENCOUNTER — Encounter (HOSPITAL_COMMUNITY): Payer: Self-pay

## 2023-05-11 ENCOUNTER — Other Ambulatory Visit: Payer: Self-pay

## 2023-05-11 ENCOUNTER — Encounter (HOSPITAL_COMMUNITY)
Admission: RE | Admit: 2023-05-11 | Discharge: 2023-05-11 | Disposition: A | Discharge status: Home / Self Care | Payer: PPO | Source: Ambulatory Visit | Attending: Orthopaedic Surgery | Admitting: Orthopaedic Surgery

## 2023-05-11 VITALS — BP 109/71 | HR 76 | Temp 98.3°F | Resp 18 | Ht 66.0 in | Wt 168.1 lb

## 2023-05-11 DIAGNOSIS — Z01818 Encounter for other preprocedural examination: Secondary | ICD-10-CM | POA: Insufficient documentation

## 2023-05-11 HISTORY — DX: Gastro-esophageal reflux disease without esophagitis: K21.9

## 2023-05-11 LAB — SURGICAL PCR SCREEN
MRSA, PCR: NEGATIVE
Staphylococcus aureus: NEGATIVE

## 2023-05-11 NOTE — Progress Notes (Addendum)
PCP - Dr. Dorinda Hill Cardiologist -   PPM/ICD - denies  Chest x-ray - n/a EKG - PAT, 05/11/2023 Stress Test -  ECHO -  Cardiac Cath -   Sleep Study - denies CPAP - denies  Non-diabetic  Blood Thinner Instructions: denies Aspirin Instructions:denies  ERAS Protcol - Yes, clear fluids until 0945 PRE-SURGERY Ensure or G2-   COVID TEST- n/a  Anesthesia review: Yes. HTN, heart murmer, needs echo.  Patient will contact Dr. Nadene Rubins for Echo. Gave fax number to patient for MD to fax results of Echo.  Patient will be returning 06/02/2023 at 0800 for lab work.  CBC, BMP, T/S  Patient denies shortness of breath, fever, cough and chest pain at PAT appointment   All instructions explained to the patient, with a verbal understanding of the material. Patient agrees to go over the instructions while at home for a better understanding. Patient also instructed to self quarantine after being tested for COVID-19. The opportunity to ask questions was provided.

## 2023-05-12 ENCOUNTER — Telehealth: Payer: Self-pay | Admitting: Orthopaedic Surgery

## 2023-05-12 NOTE — Telephone Encounter (Signed)
Patient called advised she had her pre-op yesterday at [facility] and was told to have Dr. Magnus Ivan refer patient for an Echo Cardiogram. The number to contact patient is [phone] or  [phone]

## 2023-05-13 NOTE — Telephone Encounter (Signed)
Per Antionette Poles with anesthesia, She needs to reach out to her PCP, she's overdue for followup of her aortic regurgitation.   I called and advised patient.

## 2023-05-14 ENCOUNTER — Other Ambulatory Visit (HOSPITAL_COMMUNITY): Payer: Self-pay | Admitting: Internal Medicine

## 2023-05-14 DIAGNOSIS — I351 Nonrheumatic aortic (valve) insufficiency: Secondary | ICD-10-CM

## 2023-05-20 ENCOUNTER — Other Ambulatory Visit: Payer: Self-pay

## 2023-05-22 ENCOUNTER — Telehealth: Payer: Self-pay | Admitting: Orthopaedic Surgery

## 2023-05-22 NOTE — Progress Notes (Signed)
Pt called to notify that she tested positive for COVID on 05/16/23- with symptoms of runny nose, fever, fatigue and cough. Pt states she was treated with Paxlovid and has more energy now. Pt reports currently having a cough, but no other symptoms. Pt states she feels much better. I spoke with Antionette Poles, PA. Pt notified that per anesthesia protocol, she would need to be recovered/symptom free for 2 weeks prior to surgery. Pt notified that Fayrene Fearing will reach back out to her next week to see how she is feeling. Pt states she will notify Dr. Eliberto Ivory office as well.

## 2023-05-22 NOTE — Telephone Encounter (Signed)
Pt tested Positive for Covid on 07/27 is asymptomatic except for slight cough, Pre-admin and anaesthesilogy is going to reach out to her soon has not tested Negative yet.

## 2023-05-26 ENCOUNTER — Ambulatory Visit (HOSPITAL_COMMUNITY)
Admission: RE | Admit: 2023-05-26 | Discharge: 2023-05-26 | Disposition: A | Discharge status: Home / Self Care | Payer: PPO | Source: Ambulatory Visit | Attending: Internal Medicine | Admitting: Internal Medicine

## 2023-05-26 DIAGNOSIS — E785 Hyperlipidemia, unspecified: Secondary | ICD-10-CM | POA: Insufficient documentation

## 2023-05-26 DIAGNOSIS — I351 Nonrheumatic aortic (valve) insufficiency: Secondary | ICD-10-CM | POA: Insufficient documentation

## 2023-05-26 DIAGNOSIS — I10 Essential (primary) hypertension: Secondary | ICD-10-CM | POA: Insufficient documentation

## 2023-05-26 LAB — ECHOCARDIOGRAM COMPLETE
AR max vel: 2.3 cm2
AV Area VTI: 2.07 cm2
AV Area mean vel: 2.14 cm2
AV Mean grad: 4.5 mmHg
AV Peak grad: 7.4 mmHg
AV Vena cont: 0.4 cm
Ao pk vel: 1.36 m/s
Area-P 1/2: 2.28 cm2
Calc EF: 61.3 %
MV VTI: 3.05 cm2
P 1/2 time: 894 msec
S' Lateral: 2.7 cm
Single Plane A2C EF: 62.7 %
Single Plane A4C EF: 62 %

## 2023-05-26 NOTE — Telephone Encounter (Signed)
Patient called back and we discussed.  She thinks Sharon [name] already reached out but she was at an appointment.  I told her to talk to him and he could advise [place] about proceeding.

## 2023-05-26 NOTE — Telephone Encounter (Signed)
Per Antionette Poles, PA-C, She's doing pretty well, I think we're good as scheduled. She's coming back in on th 13th for labs and ill see her in person then too.

## 2023-05-26 NOTE — Telephone Encounter (Signed)
Per Antionette Poles, PA-C at [facility] anesthesia regarding 06/09/23 surgery date, as long as she is asymptomatic for 2 weeks. I have her on my list to call and followup. Its going to be close.  I called patient and left voice mail for return call.

## 2023-06-01 ENCOUNTER — Encounter: Payer: PPO | Admitting: Orthopaedic Surgery

## 2023-06-02 ENCOUNTER — Encounter (HOSPITAL_COMMUNITY)
Admission: RE | Admit: 2023-06-02 | Discharge: 2023-06-02 | Disposition: A | Discharge status: Home / Self Care | Payer: PPO | Source: Ambulatory Visit | Attending: Orthopaedic Surgery | Admitting: Orthopaedic Surgery

## 2023-06-02 DIAGNOSIS — I10 Essential (primary) hypertension: Secondary | ICD-10-CM | POA: Diagnosis not present

## 2023-06-02 DIAGNOSIS — Z01812 Encounter for preprocedural laboratory examination: Secondary | ICD-10-CM | POA: Insufficient documentation

## 2023-06-02 DIAGNOSIS — T84033A Mechanical loosening of internal left knee prosthetic joint, initial encounter: Secondary | ICD-10-CM | POA: Insufficient documentation

## 2023-06-02 DIAGNOSIS — Z01818 Encounter for other preprocedural examination: Secondary | ICD-10-CM

## 2023-06-02 DIAGNOSIS — M159 Polyosteoarthritis, unspecified: Secondary | ICD-10-CM | POA: Diagnosis not present

## 2023-06-02 DIAGNOSIS — R059 Cough, unspecified: Secondary | ICD-10-CM | POA: Diagnosis not present

## 2023-06-02 LAB — BASIC METABOLIC PANEL
Anion gap: 7 (ref 5–15)
BUN: 14 mg/dL (ref 8–23)
CO2: 24 mmol/L (ref 22–32)
Calcium: 9.4 mg/dL (ref 8.9–10.3)
Chloride: 106 mmol/L (ref 98–111)
Creatinine, Ser: 0.79 mg/dL (ref 0.44–1.00)
GFR, Estimated: >60 mL/min (ref >60)
Glucose, Bld: 142 mg/dL — ABNORMAL HIGH (ref 70–99)
Potassium: 3.4 mmol/L — ABNORMAL LOW (ref 3.5–5.1)
Sodium: 137 mmol/L (ref 135–145)

## 2023-06-02 LAB — TYPE AND SCREEN
ABO/RH(D): A POS
Antibody Screen: NEGATIVE

## 2023-06-02 LAB — CBC
HCT: 37.6 % (ref 36.0–46.0)
Hemoglobin: 12.2 g/dL (ref 12.0–15.0)
MCH: 31.1 pg (ref 26.0–34.0)
MCHC: 32.4 g/dL (ref 30.0–36.0)
MCV: 95.9 fL (ref 80.0–100.0)
Platelets: 327 10*3/uL (ref 150–400)
RBC: 3.92 MIL/uL (ref 3.87–5.11)
RDW: 12.1 % (ref 11.5–15.5)
WBC: 5.7 10*3/uL (ref 4.0–10.5)
nRBC: 0 % (ref 0.0–0.2)

## 2023-06-02 NOTE — Progress Notes (Addendum)
Anesthesia Review:  PCP: DR Dorinda Hill - preop today .  Cardiologist : none  Chest x-ray : EKG : 05/11/23  Echo : 05/26/23  Stress test: Cardiac Cath :  Activity level: can do a flight of stairs wtihout difficutly  Sleep Study/ CPAP : Fasting Blood Sugar :      / Checks Blood Sugar -- times a day:   Blood Thinner/ Instructions /Last Dose: ASA / Instructions/ Last Dose :    Labs of cbc,  bmp done 06/02/23.  In epic .   Surgery was originially scheduled for end of July.  Original preop was done on 05/11/23.     Pt had Positive on  Covid on 05/16/23.  Received Paxlovid. On 05/18/23.  Called into PCP .  No visit. PT feels better on 06/02/23.  PT still has cough.  DR Magnus Ivan is aware per pt .  NO fever.  06/02/23- cvoid test on negative.  Per Fayrene Fearing at [facility] wants pt to be seen by PCP as soon as possible.    Reviewed proep instructions with pt on 06/02/23  for [employer] on 06/09/2023.  and found out she does not have Ensure presurgery drinnk.  Pt to pick up.  Out front at [facility].  ERAS protocol and clearliquids until 1145am.  PT aware.  PT aware where to reports on 06/09/23 at [facility].

## 2023-06-05 NOTE — Anesthesia Preprocedure Evaluation (Signed)
Anesthesia Evaluation  Patient identified by MRN, date of birth, ID band Patient awake    Reviewed: Allergy & Precautions, NPO status , Patient's Chart, lab work & pertinent test results  History of Anesthesia Complications (+) Family history of anesthesia reaction  Airway Mallampati: II  TM Distance: >3 FB Neck ROM: Full    Dental  (+) Dental Advisory Given, Teeth Intact   Pulmonary Recent URI    Pulmonary exam normal breath sounds clear to auscultation       Cardiovascular hypertension, Pt. on medications + Valvular Problems/Murmurs AI  Rhythm:Regular Rate:Normal  Echo 05/26/2023  1. Left ventricular ejection fraction, by estimation, is 60 to 65%. The left ventricle has normal function. The left ventricle has no regional wall motion abnormalities. There is mild left ventricular hypertrophy of the basal-septal segment. Left ventricular diastolic parameters are indeterminate.  2. Right ventricular systolic function is normal. The right ventricular size is normal. There is normal pulmonary artery systolic pressure. The estimated right ventricular systolic pressure is 25.3 mmHg.   3. The mitral valve is degenerative. Trivial mitral valve regurgitation. No evidence of mitral stenosis.   4. The aortic valve is tricuspid. Aortic valve regurgitation is mild to moderate. Aortic valve sclerosis/calcification is present, without any evidence of aortic stenosis. Aortic regurgitation PHT measures 894 msec. Aortic valve area, by VTI measures 2.07 cm. Aortic valve mean gradient measures 4.5 mmHg. Aortic valve Vmax measures 1.36 m/s.   5. The inferior vena cava is normal in size with greater than 50% respiratory variability, suggesting right atrial pressure of 3 mmHg.     Neuro/Psych  Headaches    GI/Hepatic Neg liver ROS,GERD  ,,  Endo/Other  negative endocrine ROS    Renal/GU negative Renal ROS     Musculoskeletal  (+) Arthritis ,     Abdominal   Peds  Hematology negative hematology ROS (+)   Anesthesia Other Findings   Reproductive/Obstetrics                             Anesthesia Physical Anesthesia Plan  ASA: 3  Anesthesia Plan: Spinal   Post-op Pain Management: Tylenol PO (pre-op)* and Regional block*   Induction: Intravenous  PONV Risk Score and Plan: 3 and Ondansetron, Dexamethasone, Treatment may vary due to age or medical condition, Propofol infusion and TIVA  Airway Management Planned: Natural Airway  Additional Equipment:   Intra-op Plan:   Post-operative Plan:   Informed Consent: I have reviewed the patients History and Physical, chart, labs and discussed the procedure including the risks, benefits and alternatives for the proposed anesthesia with the patient or authorized representative who has indicated his/her understanding and acceptance.     Dental advisory given  Plan Discussed with: CRNA  Anesthesia Plan Comments: (PAT note by Antionette Poles, PA-C: 72 year old female with pertinent history including HTN, HLD, GERD.  Scheduled for left revision TKA 06/09/2023.  I spoke with patient at her preadmission testing appointment on 05/11/2023 to advise that she will need updated echocardiogram due to history of moderate aortic regurgitation on echo 03/2018.    Patient subsequently called back to preadmission testing clinic on 05/22/23 to advise that she had tested positive for COVID on 05/16/2023 symptoms of runny nose, fever, fatigue, and cough.  She was treated with Paxlovid and symptoms are improving.  Patient was advised that she would need to be symptom-free for >2 weeks from recent URI.  Updated echo 05/26/2023 showed EF 60 to  65% with mild to moderate aortic regurgitation.  Patient came into PAT on 06/02/2023 for blood work and I reevaluated her at that time.  Overall she is noted to be feeling well but continues to have a nonproductive cough.  No shortness of breath.  On  exam lungs are clear bilaterally. Deep breathing elicits coughing.  Discussed that she probably has a component of postviral airway hyperreactivity.  She did report remote history of asthma, however states this has been quiescent for many years.  I encouraged her to reach out to her PCP for reevaluation.  She was seen by her PCP same-day on 06/02/2023 and had a chest x-ray which was clear and was prescribed albuterol/budesonide inhaler and Delsym cough syrup. I called and spoke with her on 8/16 to followup. She states she has seen improvement with this. Reports she was able to sing in choir service on 8/15 without coughing and has had minimal cough today. She is using the inhaler ~q6hr prophylactically.  Case reviewed with anesthesiologist Dr. Hyacinth Meeker.  He advised if she continues on the trajectory of improvement with minimal cough she can likely proceed as planned.  Patient understands that if she has any worsening of symptoms she is to let us know and this may require postponement of surgery.  Preop labs reviewed, mild hypokalemia potassium 3.4, otherwise unremarkable.   EKG 05/11/23: Sinus bradycardia. Rate 57.   TTE 05/26/2023: 1. Left ventricular ejection fraction, by estimation, is 60 to 65%. The  left ventricle has normal function. The left ventricle has no regional  wall motion abnormalities. There is mild left ventricular hypertrophy of  the basal-septal segment. Left  ventricular diastolic parameters are indeterminate.  2. Right ventricular systolic function is normal. The right ventricular  size is normal. There is normal pulmonary artery systolic pressure. The  estimated right ventricular systolic pressure is 25.3 mmHg.  3. The mitral valve is degenerative. Trivial mitral valve regurgitation.  No evidence of mitral stenosis.  4. The aortic valve is tricuspid. Aortic valve regurgitation is mild to  moderate. Aortic valve sclerosis/calcification is present, without any  evidence of  aortic stenosis. Aortic regurgitation PHT measures 894 msec.  Aortic valve area, by VTI measures  2.07 cm. Aortic valve mean gradient measures 4.5 mmHg. Aortic valve Vmax  measures 1.36 m/s.  5. The inferior vena cava is normal in size with greater than 50%  respiratory variability, suggesting right atrial pressure of 3 mmHg.    )        Anesthesia Quick Evaluation

## 2023-06-05 NOTE — Progress Notes (Signed)
Anesthesia Chart Review:  72 year old female with pertinent history including HTN, HLD, GERD.  Scheduled for left revision TKA 06/09/2023.  I spoke with patient at her preadmission testing appointment on 05/11/2023 to advise that she will need updated echocardiogram due to history of moderate aortic regurgitation on echo 03/2018.    Patient subsequently called back to preadmission testing clinic on 05/22/23 to advise that she had tested positive for COVID on 05/16/2023 symptoms of runny nose, fever, fatigue, and cough.  She was treated with Paxlovid and symptoms are improving.  Patient was advised that she would need to be symptom-free for >2 weeks from recent URI.  Updated echo 05/26/2023 showed EF 60 to 65% with mild to moderate aortic regurgitation.  Patient came into PAT on 06/02/2023 for blood work and I reevaluated her at that time.  Overall she is noted to be feeling well but continues to have a nonproductive cough.  No shortness of breath.  On exam lungs are clear bilaterally. Deep breathing elicits coughing.  Discussed that she probably has a component of postviral airway hyperreactivity.  She did report remote history of asthma, however states this has been quiescent for many years.  I encouraged her to reach out to her PCP for reevaluation.  She was seen by her PCP same-day on 06/02/2023 and had a chest x-ray which was clear and was prescribed albuterol/budesonide inhaler and Delsym cough syrup. I called and spoke with her on 8/16 to followup. She states she has seen improvement with this. Reports she was able to sing in choir service on 8/15 without coughing and has had minimal cough today. She is using the inhaler ~q6hr prophylactically.  Case reviewed with anesthesiologist Dr. Hyacinth Meeker.  He advised if she continues on the trajectory of improvement with minimal cough she can likely proceed as planned.  Patient understands that if she has any worsening of symptoms she is to let us know and this may  require postponement of surgery.  Preop labs reviewed, mild hypokalemia potassium 3.4, otherwise unremarkable.   EKG 05/11/23: Sinus bradycardia. Rate 57.   TTE 05/26/2023: 1. Left ventricular ejection fraction, by estimation, is 60 to 65%. The  left ventricle has normal function. The left ventricle has no regional  wall motion abnormalities. There is mild left ventricular hypertrophy of  the basal-septal segment. Left  ventricular diastolic parameters are indeterminate.   2. Right ventricular systolic function is normal. The right ventricular  size is normal. There is normal pulmonary artery systolic pressure. The  estimated right ventricular systolic pressure is 25.3 mmHg.   3. The mitral valve is degenerative. Trivial mitral valve regurgitation.  No evidence of mitral stenosis.   4. The aortic valve is tricuspid. Aortic valve regurgitation is mild to  moderate. Aortic valve sclerosis/calcification is present, without any  evidence of aortic stenosis. Aortic regurgitation PHT measures 894 msec.  Aortic valve area, by VTI measures  2.07 cm. Aortic valve mean gradient measures 4.5 mmHg. Aortic valve Vmax  measures 1.36 m/s.   5. The inferior vena cava is normal in size with greater than 50%  respiratory variability, suggesting right atrial pressure of 3 mmHg.      Zannie Cove [facility]/Anesthesiology Phone [phone] 06/05/2023 1:26 PM

## 2023-06-08 DIAGNOSIS — T84033A Mechanical loosening of internal left knee prosthetic joint, initial encounter: Secondary | ICD-10-CM | POA: Insufficient documentation

## 2023-06-08 NOTE — H&P (Signed)
Sharon [name] is an 72 y.o. female.   Chief Complaint: Left knee pain and instability symptoms HPI: The patient is a 72 year old female who is about 8 years out from a left primary total knee replacement to treat significant left knee arthritis.  That he had done well until about a year ago she started developing symptoms of instability of that left knee as well as swelling.  Plain films and a bone scan were obtained and it is indicative of uptake around the tibial tray with potential loosening of the tibial component.  At this point we have recommended revision arthroplasty of at minimum the tibial component with a polyliner exchange as well with knowing that there may be additional revision depending on intraoperative findings.  She has tried and failed all forms of conservative treatment.  There has been no evidence of infection with this knee at all.  This appears to be aseptic loosening.  There has been no issues with her right total knee replacement.  Past Medical History:  Diagnosis Date   Cataract    Family history of adverse reaction to anesthesia    "mom did; don't know what happened" (07/20/2018)   Family history of colon cancer    Family history of ovarian cancer    Family history of stomach cancer    Family history of uterine cancer    Fibroid    GERD (gastroesophageal reflux disease)    Heart murmur    "tiny, tiny one" (07/20/2018)   High cholesterol    Hypertension    Migraine    "a few/year; last one was 07/19/2018" (07/20/2018)   Osteoarthritis    "knees, hips, hands, probably in my back" (07/20/2018)   Ringworm    Vitamin D deficiency     Past Surgical History:  Procedure Laterality Date   APPENDECTOMY  1969   BUNIONECTOMY Bilateral    EYE SURGERY Bilateral    JOINT REPLACEMENT     KNEE ARTHROSCOPY Left 11/2013   Precancerous lesion excised     "arm"   TONSILLECTOMY     TOTAL ABDOMINAL HYSTERECTOMY  1990   TAH BSO   TOTAL HIP ARTHROPLASTY Left 07/20/2018    TOTAL HIP ARTHROPLASTY Left 07/20/2018   Procedure: LEFT TOTAL HIP ARTHROPLASTY ANTERIOR APPROACH;  Surgeon: Kathryne Hitch, MD;  Location: [facility] OR;  Service: Orthopedics;  Laterality: Left;   TOTAL KNEE ARTHROPLASTY Right 2012   TOTAL KNEE ARTHROPLASTY Left 04/03/2015   Procedure: TOTAL KNEE ARTHROPLASTY;  Surgeon: Valeria Batman, MD;  Location: [facility] OR;  Service: Orthopedics;  Laterality: Left;    Family History  Problem Relation Age of Onset   Ovarian cancer Mother 15   Heart disease Maternal Grandfather    Hypertension Paternal Grandmother    Ovarian cancer Cousin        maternal first cousin dx in her 32s   Heart disease Father    Atrial fibrillation Father    Heart disease Brother    Heart disease Brother    Colon cancer Maternal Aunt        dx in her 34s-60   Diabetes Paternal Aunt    Ovarian cancer Maternal Grandmother        dx in her mid 23s   Cancer Maternal Grandmother        OVARIAN AND UTERINE   Uterine cancer Maternal Grandmother        dx in her early 74s   Stomach cancer Maternal Uncle  dx in his 52s-80s   Stroke Maternal Uncle    Leukemia Paternal Uncle    Social History:  reports that she has never smoked. She has never used smokeless tobacco. She reports current alcohol use of about 1.0 - 2.0 standard drink of alcohol per week. She reports that she does not use drugs.  Allergies:  Allergies  Allergen Reactions   Codeine Other (See Comments)    headache   Pollen Extract Other (See Comments)    Blisters   Tape Other (See Comments)    Blisters    Ceftin Rash   Cefuroxime Axetil Rash    No medications prior to admission.    No results found for this or any previous visit (from the past 48 hour(s)). No results found.  Review of Systems  Last menstrual period 02/17/1989. Physical Exam Vitals reviewed.  Constitutional:      Appearance: Normal appearance. She is normal weight.  HENT:     Head: Normocephalic and atraumatic.   Eyes:     Extraocular Movements: Extraocular movements intact.     Pupils: Pupils are equal, round, and reactive to light.  Cardiovascular:     Rate and Rhythm: Normal rate.     Pulses: Normal pulses.  Pulmonary:     Effort: Pulmonary effort is normal.     Breath sounds: Normal breath sounds.  Abdominal:     Palpations: Abdomen is soft.  Musculoskeletal:     Cervical back: Normal range of motion and neck supple.     Left knee: Effusion and bony tenderness present. Tenderness present over the medial joint line. ACL laxity present.  Neurological:     Mental Status: She is alert and oriented to person, place, and time.  Psychiatric:        Behavior: Behavior normal.      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  Depending on the interoperative findings there is a chance that the femoral component will need to be revised as well.  Thus far there is no evidence on plain film and bone scan showing loosening of the femoral component.  The risks and benefits of surgery have been explained in significant detail.  Kathryne Hitch, MD 06/08/2023, 7:55 PM

## 2023-06-09 ENCOUNTER — Other Ambulatory Visit: Payer: Self-pay

## 2023-06-09 ENCOUNTER — Encounter (HOSPITAL_COMMUNITY)
Admission: RE | Disposition: A | Discharge status: Home Health | Payer: Self-pay | Source: Home / Self Care | Attending: Orthopaedic Surgery

## 2023-06-09 ENCOUNTER — Inpatient Hospital Stay (HOSPITAL_COMMUNITY): Payer: PPO | Admitting: Physician Assistant

## 2023-06-09 ENCOUNTER — Encounter (HOSPITAL_COMMUNITY): Payer: Self-pay | Admitting: Orthopaedic Surgery

## 2023-06-09 ENCOUNTER — Inpatient Hospital Stay (HOSPITAL_COMMUNITY)
Admission: RE | Admit: 2023-06-09 | Discharge: 2023-06-10 | DRG: 489 | Disposition: A | Discharge status: Home Health | Payer: PPO | Attending: Orthopaedic Surgery | Admitting: Orthopaedic Surgery

## 2023-06-09 ENCOUNTER — Inpatient Hospital Stay (HOSPITAL_COMMUNITY): Payer: PPO

## 2023-06-09 DIAGNOSIS — Z96642 Presence of left artificial hip joint: Secondary | ICD-10-CM | POA: Diagnosis not present

## 2023-06-09 DIAGNOSIS — Z90710 Acquired absence of both cervix and uterus: Secondary | ICD-10-CM

## 2023-06-09 DIAGNOSIS — Z01818 Encounter for other preprocedural examination: Principal | ICD-10-CM

## 2023-06-09 DIAGNOSIS — Y772 Prosthetic and other implants, materials and accessory ophthalmic devices associated with adverse incidents: Secondary | ICD-10-CM | POA: Diagnosis present

## 2023-06-09 DIAGNOSIS — T84063A Wear of articular bearing surface of internal prosthetic left knee joint, initial encounter: Principal | ICD-10-CM | POA: Diagnosis present

## 2023-06-09 DIAGNOSIS — G8918 Other acute postprocedural pain: Secondary | ICD-10-CM | POA: Diagnosis not present

## 2023-06-09 DIAGNOSIS — T84033A Mechanical loosening of internal left knee prosthetic joint, initial encounter: Secondary | ICD-10-CM

## 2023-06-09 DIAGNOSIS — Z96651 Presence of right artificial knee joint: Secondary | ICD-10-CM | POA: Diagnosis present

## 2023-06-09 DIAGNOSIS — Z90722 Acquired absence of ovaries, bilateral: Secondary | ICD-10-CM | POA: Diagnosis not present

## 2023-06-09 DIAGNOSIS — Z8041 Family history of malignant neoplasm of ovary: Secondary | ICD-10-CM | POA: Diagnosis not present

## 2023-06-09 DIAGNOSIS — R609 Edema, unspecified: Secondary | ICD-10-CM | POA: Diagnosis not present

## 2023-06-09 DIAGNOSIS — Z96652 Presence of left artificial knee joint: Secondary | ICD-10-CM | POA: Diagnosis not present

## 2023-06-09 DIAGNOSIS — I10 Essential (primary) hypertension: Secondary | ICD-10-CM | POA: Diagnosis present

## 2023-06-09 DIAGNOSIS — Z9079 Acquired absence of other genital organ(s): Secondary | ICD-10-CM | POA: Diagnosis not present

## 2023-06-09 DIAGNOSIS — Z800 Family history of malignant neoplasm of digestive organs: Secondary | ICD-10-CM

## 2023-06-09 DIAGNOSIS — E785 Hyperlipidemia, unspecified: Secondary | ICD-10-CM | POA: Diagnosis not present

## 2023-06-09 HISTORY — PX: TOTAL KNEE REVISION: SHX996

## 2023-06-09 LAB — TYPE AND SCREEN
ABO/RH(D): A POS
Antibody Screen: NEGATIVE

## 2023-06-09 SURGERY — TOTAL KNEE REVISION
Anesthesia: Spinal | Site: Knee | Laterality: Left

## 2023-06-09 MED ORDER — DIPHENHYDRAMINE HCL 12.5 MG/5ML PO ELIX: 12.5000 mg | ORAL_SOLUTION | ORAL | Status: DC | PRN

## 2023-06-09 MED ORDER — VITAMIN D 25 MCG (1000 UNIT) PO TABS
2000.0000 [IU] | ORAL_TABLET | Freq: Every day | ORAL | Status: DC
  Administered 2023-06-09 – 2023-06-10 (×2): 2000 [IU] via ORAL
  Filled 2023-06-09 (×2): qty 2

## 2023-06-09 MED ORDER — ROPIVACAINE HCL 5 MG/ML IJ SOLN
INTRAMUSCULAR | Status: DC | PRN
Start: 2023-06-09 — End: 2023-06-09
  Administered 2023-06-09: 30 mL via PERINEURAL

## 2023-06-09 MED ORDER — SODIUM CHLORIDE 0.9 % IV SOLN: INTRAVENOUS | Status: DC

## 2023-06-09 MED ORDER — DROPERIDOL 2.5 MG/ML IJ SOLN: 0.6250 mg | Freq: Once | INTRAMUSCULAR | Status: DC | PRN

## 2023-06-09 MED ORDER — BUPIVACAINE IN DEXTROSE 0.75-8.25 % IT SOLN
INTRATHECAL | Status: DC | PRN
Start: 2023-06-09 — End: 2023-06-09
  Administered 2023-06-09: 1.6 mL via INTRATHECAL

## 2023-06-09 MED ORDER — PHENYLEPHRINE HCL-NACL 20-0.9 MG/250ML-% IV SOLN
INTRAVENOUS | Status: DC | PRN
  Administered 2023-06-09: 40 ug/min via INTRAVENOUS

## 2023-06-09 MED ORDER — POVIDONE-IODINE 10 % EX SWAB: 2.0000 | Freq: Once | CUTANEOUS | Status: DC

## 2023-06-09 MED ORDER — ACETAMINOPHEN 325 MG PO TABS
325.0000 mg | ORAL_TABLET | Freq: Four times a day (QID) | ORAL | Status: DC | PRN
  Administered 2023-06-10: 650 mg via ORAL
  Filled 2023-06-09: qty 2

## 2023-06-09 MED ORDER — HYDROMORPHONE HCL 1 MG/ML IJ SOLN: 0.5000 mg | INTRAMUSCULAR | Status: DC | PRN

## 2023-06-09 MED ORDER — BUPIVACAINE-EPINEPHRINE 0.25% -1:200000 IJ SOLN
INTRAMUSCULAR | Status: AC
  Filled 2023-06-09: qty 1

## 2023-06-09 MED ORDER — MENTHOL 3 MG MT LOZG: 1.0000 | LOZENGE | OROMUCOSAL | Status: DC | PRN

## 2023-06-09 MED ORDER — GLYCOPYRROLATE 0.2 MG/ML IJ SOLN
INTRAMUSCULAR | Status: AC
  Filled 2023-06-09: qty 1

## 2023-06-09 MED ORDER — FENTANYL CITRATE (PF) 100 MCG/2ML IJ SOLN
INTRAMUSCULAR | Status: AC
  Filled 2023-06-09: qty 2

## 2023-06-09 MED ORDER — PHENOL 1.4 % MT LIQD: 1.0000 | OROMUCOSAL | Status: DC | PRN

## 2023-06-09 MED ORDER — TRANEXAMIC ACID-NACL 1000-0.7 MG/100ML-% IV SOLN
1000.0000 mg | INTRAVENOUS | Status: AC
  Administered 2023-06-09: 1000 mg via INTRAVENOUS
  Filled 2023-06-09: qty 100

## 2023-06-09 MED ORDER — 0.9 % SODIUM CHLORIDE (POUR BTL) OPTIME
TOPICAL | Status: DC | PRN
Start: 2023-06-09 — End: 2023-06-09
  Administered 2023-06-09: 1000 mL

## 2023-06-09 MED ORDER — BUPIVACAINE-EPINEPHRINE (PF) 0.5% -1:200000 IJ SOLN: INTRAMUSCULAR | Status: DC | PRN

## 2023-06-09 MED ORDER — HYDROMORPHONE HCL 1 MG/ML IJ SOLN: 0.2500 mg | INTRAMUSCULAR | Status: DC | PRN

## 2023-06-09 MED ORDER — ORAL CARE MOUTH RINSE: 15.0000 mL | Freq: Once | OROMUCOSAL | Status: AC

## 2023-06-09 MED ORDER — OXYCODONE HCL 5 MG PO TABS
5.0000 mg | ORAL_TABLET | ORAL | Status: DC | PRN
  Administered 2023-06-09 – 2023-06-10 (×3): 10 mg via ORAL
  Filled 2023-06-09 (×3): qty 2

## 2023-06-09 MED ORDER — PROPOFOL 500 MG/50ML IV EMUL
INTRAVENOUS | Status: DC | PRN
  Administered 2023-06-09: 65 ug/kg/min via INTRAVENOUS

## 2023-06-09 MED ORDER — DEXAMETHASONE SODIUM PHOSPHATE 4 MG/ML IJ SOLN
INTRAMUSCULAR | Status: DC | PRN
  Administered 2023-06-09: 5 mg via PERINEURAL

## 2023-06-09 MED ORDER — PROPOFOL 1000 MG/100ML IV EMUL
INTRAVENOUS | Status: AC
  Filled 2023-06-09: qty 100

## 2023-06-09 MED ORDER — VERAPAMIL HCL ER 240 MG PO TBCR
240.0000 mg | EXTENDED_RELEASE_TABLET | Freq: Two times a day (BID) | ORAL | Status: DC
  Administered 2023-06-09 – 2023-06-10 (×2): 240 mg via ORAL
  Filled 2023-06-09 (×2): qty 1

## 2023-06-09 MED ORDER — CYCLOSPORINE 0.05 % OP EMUL
1.0000 [drp] | Freq: Two times a day (BID) | OPHTHALMIC | Status: DC
  Administered 2023-06-09 – 2023-06-10 (×2): 1 [drp] via OPHTHALMIC
  Filled 2023-06-09 (×2): qty 30

## 2023-06-09 MED ORDER — ALUM & MAG HYDROXIDE-SIMETH 200-200-20 MG/5ML PO SUSP: 30.0000 mL | ORAL | Status: DC | PRN

## 2023-06-09 MED ORDER — BUPIVACAINE-EPINEPHRINE (PF) 0.25% -1:200000 IJ SOLN
INTRAMUSCULAR | Status: DC | PRN
  Administered 2023-06-09: 30 mL via INTRAMUSCULAR

## 2023-06-09 MED ORDER — VANCOMYCIN HCL IN DEXTROSE 1-5 GM/200ML-% IV SOLN
1000.0000 mg | Freq: Two times a day (BID) | INTRAVENOUS | Status: AC
  Administered 2023-06-09: 1000 mg via INTRAVENOUS
  Filled 2023-06-09: qty 200

## 2023-06-09 MED ORDER — MIDAZOLAM HCL 2 MG/2ML IJ SOLN
2.0000 mg | Freq: Once | INTRAMUSCULAR | Status: DC
  Filled 2023-06-09: qty 2

## 2023-06-09 MED ORDER — HYDROCHLOROTHIAZIDE 25 MG PO TABS
25.0000 mg | ORAL_TABLET | Freq: Every day | ORAL | Status: DC
  Administered 2023-06-10: 25 mg via ORAL
  Filled 2023-06-09: qty 1

## 2023-06-09 MED ORDER — METHOCARBAMOL 500 MG PO TABS
500.0000 mg | ORAL_TABLET | Freq: Four times a day (QID) | ORAL | Status: DC | PRN
  Administered 2023-06-09 – 2023-06-10 (×2): 500 mg via ORAL
  Filled 2023-06-09 (×2): qty 1

## 2023-06-09 MED ORDER — ASPIRIN 81 MG PO CHEW
81.0000 mg | CHEWABLE_TABLET | Freq: Two times a day (BID) | ORAL | Status: DC
  Administered 2023-06-09 – 2023-06-10 (×2): 81 mg via ORAL
  Filled 2023-06-09 (×2): qty 1

## 2023-06-09 MED ORDER — PANTOPRAZOLE SODIUM 40 MG PO TBEC
40.0000 mg | DELAYED_RELEASE_TABLET | Freq: Every day | ORAL | Status: DC
  Administered 2023-06-09 – 2023-06-10 (×2): 40 mg via ORAL
  Filled 2023-06-09 (×2): qty 1

## 2023-06-09 MED ORDER — METHOCARBAMOL 500 MG IVPB - SIMPLE MED: 500.0000 mg | Freq: Four times a day (QID) | INTRAVENOUS | Status: DC | PRN

## 2023-06-09 MED ORDER — ONDANSETRON HCL 4 MG PO TABS: 4.0000 mg | ORAL_TABLET | Freq: Four times a day (QID) | ORAL | Status: DC | PRN

## 2023-06-09 MED ORDER — VANCOMYCIN HCL IN DEXTROSE 1-5 GM/200ML-% IV SOLN
1000.0000 mg | INTRAVENOUS | Status: AC
  Administered 2023-06-09: 1000 mg via INTRAVENOUS
  Filled 2023-06-09: qty 200

## 2023-06-09 MED ORDER — DOCUSATE SODIUM 100 MG PO CAPS
100.0000 mg | ORAL_CAPSULE | Freq: Two times a day (BID) | ORAL | Status: DC
  Administered 2023-06-09 – 2023-06-10 (×2): 100 mg via ORAL
  Filled 2023-06-09 (×2): qty 1

## 2023-06-09 MED ORDER — ONDANSETRON HCL 4 MG/2ML IJ SOLN
INTRAMUSCULAR | Status: AC
  Filled 2023-06-09: qty 2

## 2023-06-09 MED ORDER — ONDANSETRON HCL 4 MG/2ML IJ SOLN
INTRAMUSCULAR | Status: DC | PRN
Start: 2023-06-09 — End: 2023-06-09
  Administered 2023-06-09: 4 mg via INTRAVENOUS

## 2023-06-09 MED ORDER — STERILE WATER FOR IRRIGATION IR SOLN
Status: DC | PRN
Start: 2023-06-09 — End: 2023-06-09
  Administered 2023-06-09: 2000 mL

## 2023-06-09 MED ORDER — ATORVASTATIN CALCIUM 10 MG PO TABS
10.0000 mg | ORAL_TABLET | ORAL | Status: DC
  Administered 2023-06-10: 10 mg via ORAL
  Filled 2023-06-09: qty 1

## 2023-06-09 MED ORDER — METOCLOPRAMIDE HCL 5 MG/ML IJ SOLN: 5.0000 mg | Freq: Three times a day (TID) | INTRAMUSCULAR | Status: DC | PRN

## 2023-06-09 MED ORDER — FENTANYL CITRATE (PF) 100 MCG/2ML IJ SOLN
INTRAMUSCULAR | Status: DC | PRN
  Administered 2023-06-09: 50 ug via INTRAVENOUS

## 2023-06-09 MED ORDER — LEVOFLOXACIN IN D5W 500 MG/100ML IV SOLN
500.0000 mg | INTRAVENOUS | Status: AC
  Administered 2023-06-09: 500 mg via INTRAVENOUS
  Filled 2023-06-09: qty 100

## 2023-06-09 MED ORDER — CLONIDINE HCL (ANALGESIA) 100 MCG/ML EP SOLN
EPIDURAL | Status: DC | PRN
  Administered 2023-06-09: 80 ug

## 2023-06-09 MED ORDER — EPHEDRINE 5 MG/ML INJ
INTRAVENOUS | Status: AC
  Filled 2023-06-09: qty 5

## 2023-06-09 MED ORDER — LACTATED RINGERS IV SOLN: INTRAVENOUS | Status: DC

## 2023-06-09 MED ORDER — ACETAMINOPHEN 500 MG PO TABS
1000.0000 mg | ORAL_TABLET | Freq: Once | ORAL | Status: AC
  Administered 2023-06-09: 1000 mg via ORAL
  Filled 2023-06-09: qty 2

## 2023-06-09 MED ORDER — METOCLOPRAMIDE HCL 5 MG PO TABS: 5.0000 mg | ORAL_TABLET | Freq: Three times a day (TID) | ORAL | Status: DC | PRN

## 2023-06-09 MED ORDER — ONDANSETRON HCL 4 MG/2ML IJ SOLN: 4.0000 mg | Freq: Four times a day (QID) | INTRAMUSCULAR | Status: DC | PRN

## 2023-06-09 MED ORDER — SODIUM CHLORIDE 0.9 % IR SOLN
Status: DC | PRN
  Administered 2023-06-09: 1000 mL

## 2023-06-09 MED ORDER — OXYCODONE HCL 5 MG PO TABS: 10.0000 mg | ORAL_TABLET | ORAL | Status: DC | PRN

## 2023-06-09 MED ORDER — FENTANYL CITRATE PF 50 MCG/ML IJ SOSY
100.0000 ug | PREFILLED_SYRINGE | Freq: Once | INTRAMUSCULAR | Status: AC
  Administered 2023-06-09: 50 ug via INTRAVENOUS
  Filled 2023-06-09: qty 2

## 2023-06-09 MED ORDER — CHLORHEXIDINE GLUCONATE 0.12 % MT SOLN
15.0000 mL | Freq: Once | OROMUCOSAL | Status: AC
  Administered 2023-06-09: 15 mL via OROMUCOSAL

## 2023-06-09 SURGICAL SUPPLY — 56 items
APL SKNCLS STERI-STRIP NONHPOA (GAUZE/BANDAGES/DRESSINGS)
BAG COUNTER SPONGE SURGICOUNT (BAG) IMPLANT
BAG SPEC THK2 15X12 ZIP CLS (MISCELLANEOUS)
BAG SPNG CNTER NS LX DISP (BAG)
BAG ZIPLOCK 12X15 (MISCELLANEOUS) IMPLANT
BENZOIN TINCTURE PRP APPL 2/3 (GAUZE/BANDAGES/DRESSINGS) IMPLANT
BLADE SAG 18X100X1.27 (BLADE) ×1 IMPLANT
BLADE SURG SZ10 CARB STEEL (BLADE) ×2 IMPLANT
BNDG CMPR 6 X 5 YARDS HK CLSR (GAUZE/BANDAGES/DRESSINGS) ×2
BNDG ELASTIC 6INX 5YD STR LF (GAUZE/BANDAGES/DRESSINGS) ×1 IMPLANT
CLOTH BEACON ORANGE TIMEOUT ST (SAFETY) ×1 IMPLANT
COVER SURGICAL LIGHT HANDLE (MISCELLANEOUS) ×1 IMPLANT
CUFF TOURN SGL QUICK 34 (TOURNIQUET CUFF) ×1
CUFF TRNQT CYL 34X4.125X (TOURNIQUET CUFF) ×1 IMPLANT
DRAPE INCISE IOBAN 66X45 STRL (DRAPES) ×3 IMPLANT
DRAPE U-SHAPE 47X51 STRL (DRAPES) ×1 IMPLANT
DURAPREP 26ML APPLICATOR (WOUND CARE) ×1 IMPLANT
ELECT REM PT RETURN 15FT ADLT (MISCELLANEOUS) ×1 IMPLANT
EVACUATOR 1/8 PVC DRAIN (DRAIN) IMPLANT
GAUZE PAD ABD 8X10 STRL (GAUZE/BANDAGES/DRESSINGS) ×1 IMPLANT
GAUZE SPONGE 4X4 12PLY STRL (GAUZE/BANDAGES/DRESSINGS) ×1 IMPLANT
GAUZE XEROFORM 1X8 LF (GAUZE/BANDAGES/DRESSINGS) IMPLANT
GAUZE XEROFORM 5X9 LF (GAUZE/BANDAGES/DRESSINGS) IMPLANT
GLOVE BIO SURGEON STRL SZ7.5 (GLOVE) ×1 IMPLANT
GLOVE BIOGEL PI IND STRL 8 (GLOVE) ×2 IMPLANT
GLOVE ECLIPSE 8.0 STRL XLNG CF (GLOVE) ×1 IMPLANT
GOWN STRL REUS W/ TWL XL LVL3 (GOWN DISPOSABLE) ×2 IMPLANT
GOWN STRL REUS W/TWL XL LVL3 (GOWN DISPOSABLE) ×2
HANDPIECE INTERPULSE COAX TIP (DISPOSABLE) ×1
HOLDER FOLEY CATH W/STRAP (MISCELLANEOUS) IMPLANT
IMMOBILIZER KNEE 20 (SOFTGOODS) ×1
IMMOBILIZER KNEE 20 THIGH 36 (SOFTGOODS) ×1 IMPLANT
INSERT LCS 3 PEG STD (Knees) IMPLANT
INSERT TIB LCS RP STD+ 12.5 (Knees) IMPLANT
KIT TURNOVER KIT A (KITS) IMPLANT
NDL SAFETY ECLIP 18X1.5 (MISCELLANEOUS) IMPLANT
PACK TOTAL KNEE CUSTOM (KITS) ×1 IMPLANT
PADDING CAST COTTON 6X4 STRL (CAST SUPPLIES) ×2 IMPLANT
PROTECTOR NERVE ULNAR (MISCELLANEOUS) ×1 IMPLANT
SET HNDPC FAN SPRY TIP SCT (DISPOSABLE) ×1 IMPLANT
SET PAD KNEE POSITIONER (MISCELLANEOUS) ×1 IMPLANT
SPIKE FLUID TRANSFER (MISCELLANEOUS) IMPLANT
STAPLER VISISTAT 35W (STAPLE) IMPLANT
STRIP CLOSURE SKIN 1/2X4 (GAUZE/BANDAGES/DRESSINGS) IMPLANT
SUT MNCRL AB 4-0 PS2 18 (SUTURE) IMPLANT
SUT VIC AB 0 CT1 36 (SUTURE) ×1 IMPLANT
SUT VIC AB 1 CT1 36 (SUTURE) ×2 IMPLANT
SUT VIC AB 2-0 CT1 27 (SUTURE) ×2
SUT VIC AB 2-0 CT1 TAPERPNT 27 (SUTURE) ×2 IMPLANT
SWAB COLLECTION DEVICE MRSA (MISCELLANEOUS) IMPLANT
SWAB CULTURE ESWAB REG 1ML (MISCELLANEOUS) IMPLANT
SYR 3ML LL SCALE MARK (SYRINGE) IMPLANT
TOWER CARTRIDGE SMART MIX (DISPOSABLE) IMPLANT
TRAY FOLEY MTR SLVR 16FR STAT (SET/KITS/TRAYS/PACK) ×1 IMPLANT
TUBE KAMVAC SUCTION (TUBING) IMPLANT
WATER STERILE IRR 1000ML POUR (IV SOLUTION) ×1 IMPLANT

## 2023-06-09 NOTE — Op Note (Signed)
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 Item Serial No. Manufacturer Lot No. LRB No. Used Action  INSERT LCS 3 PEG STD - [id number] Knees INSERT LCS 3 PEG STD  [employer] [id number] Left 1 Implanted  INSERT TIB LCS RP STD+ 12.5 - [id number] Knees INSERT TIB LCS RP STD+ 12.5  [employer] [id number] Left 1 Implanted   Surgeon: Vanita Panda. Magnus Ivan, MD Assistant: Rexene Edison, PA-C  Anesthesia: #1 left lower extremity adductor canal block, #2 spinal, #3 local Tourniquet time: 26 minutes EBL: Less than 100 cc Antibiotics: IV vancomycin and Levaquin Complications: None  Indications: The patient is a 72 year old female who has been a left primary total knee arthroplasty performed by one of my colleagues in 07/31/2015.  Sometime last year she developed symptoms of instability of that knee.  She denies any pain today for continued shoulder instability plain films and a bone scan was suggestive of loosening of the tibial component.  She continues to have no pain but she continues to have symptoms of instability so we recommended a left knee arthrotomy potential revising the tibial component or other components depending on her intraoperative findings.  She has tried and failed conservative treatment for many months now.  At this point her instability is affecting her mobility and her actives daily living as well as her quality of life.  We did talk about the risk of acute blood loss anemia, nerve pressure injury,  fracture, infection, DVT and wound healing issues.  Procedure description: After informed consent was obtained and appropriate left knee was marked, anesthesia obtained a left lower extremity block in the holding room the patient was brought to the operating room and set up on the operating table where spinal anesthesia was obtained.  She was then laid in supine position on the operating table and a Foley catheter was placed.  We again assessed her knee ligamentously.  Her extension was full and her flexion was full and there is some slight play on her drawer exams.  There is no significant effusion noted and no redness.  A nonsterile tourniquet is placed around her upper left thigh and her left thigh, knee, leg and ankle were prepped and draped with DuraPrep and sterile drapes including a sterile stockinette.  A timeout was called and she was then about the correct patient the correct left knee.  An Esmarch was then used to wrap out the leg and return was divided to 300 mm of pressure.  We then made a midline incision over the patella and carried this proximally distally through her old incision.  Dissection was carried down to the knee joint and a medial parapatellar arthrotomy was made to her previous arthrotomy.  We did not encounter any significant effusion at all.  Once we opened up the knee we did not find any evidence of metallosis.  We did assess the undersurface of the patella which was a metal-backed patella.  We were able to remove that polyliner and found somewhere on the polyliner.  We then flexed the knee and removed the previous 10 mm thickness polyethylene liner.  We did find  backside where polyliner.  We did not find any loosening of the femoral or tibial components and did remove the tibial component just briefly to see if it had any motion at all and there is no gross motion and no evidence of loosening at all.  We then trialed from a size 10 mm poly going up to a 12.5 mm trial polyliner and  then liner had better contact with 12.5 mm trial polyliner.  It also felt stable on exam and still had full motion.  We then removed the trial liner and irrigated the knee with normal saline solution using pulsatile lavage.  We then placed the knee polyliner which was a 12.5 mm thickness polyethylene liner for this knee which is also a RP knee.  We placed a new polyliner with a patella component as well and snapped that in place.  Again we put her through several cycles of range of motion we are pleased with range of motion and stability.  We then let the tourniquet down and hemostasis was obtained with electrocautery.  The arthrotomy was then closed with interrupted #1 Vicryl suture provide 0 Vicryl close the deep tissue and 2-0 Vicryl to close the subcutaneous tissue.  Skin was closed with staples.  Well-padded sterile dressings applied.  Taken off of the operating table and taken to the recovery room in stable condition.  Rexene Edison, PA-C did assist during the entire case from beginning to end and his assistance was crucial and medically necessary for soft tissue management and retraction, helping guide implant placement and a layered closure of the wound

## 2023-06-09 NOTE — Interval H&P Note (Signed)
History and Physical Interval Note: Patient understands that she is here today for left total knee arthroplasty revision.  She has a total knee that was placed back in 2016 due to significant osteoarthritis of her left knee.  Over the last year she has developed some symptoms of instability with that knee and some pain.  On exam it does feel like the she needs upsizing of her polyethylene liner.  There is concern on plain films and bone scan that there may be loosening of the tibial component.  She understands our goal is to revise the components as appropriate.  There is been no acute or adverse change in her medical status.  The risks and benefits of surgery been discussed in detail and informed consent has been obtained.  The left operative knee has been marked.  06/09/2023 1:44 PM  Sharon [name]  has presented today for surgery, with the diagnosis of aseptic loosening left total knee.  The various methods of treatment have been discussed with the patient and family. After consideration of risks, benefits and other options for treatment, the patient has consented to  Procedure(s): LEFT TOTAL KNEE REVISION (Left) as a surgical intervention.  The patient's history has been reviewed, patient examined, no change in status, stable for surgery.  I have reviewed the patient's chart and labs.  Questions were answered to the patient's satisfaction.     Kathryne Hitch

## 2023-06-09 NOTE — Anesthesia Procedure Notes (Signed)
Spinal  Patient location during procedure: OR Start time: 06/09/2023 2:47 PM End time: 06/09/2023 2:50 PM Reason for block: surgical anesthesia Staffing Performed: anesthesiologist  Anesthesiologist: Kaylyn Layer, MD Performed by: Kaylyn Layer, MD Authorized by: Kaylyn Layer, MD   Preanesthetic Checklist Completed: patient identified, IV checked, risks and benefits discussed, surgical consent, monitors and equipment checked, pre-op evaluation and timeout performed Spinal Block Patient position: sitting Prep: DuraPrep and site prepped and draped Patient monitoring: continuous pulse ox, blood pressure and heart rate Approach: midline Location: L3-4 Injection technique: single-shot Needle Needle type: Pencan  Needle gauge: 24 G Needle length: 9 cm Assessment Events: CSF return Additional Notes Risks, benefits, and alternative discussed. Patient gave consent to procedure. Prepped and draped in sitting position. Patient sedated but responsive to voice. Clear CSF obtained after one needle pass. Positive terminal aspiration. No pain or paraesthesias with injection. Patient tolerated procedure well. Vital signs stable. Amalia Greenhouse, MD

## 2023-06-09 NOTE — Anesthesia Procedure Notes (Signed)
Anesthesia Regional Block: Adductor canal block   Pre-Anesthetic Checklist: , timeout performed,  Correct Patient, Correct Site, Correct Laterality,  Correct Procedure, Correct Position, site marked,  Risks and benefits discussed,  Surgical consent,  Pre-op evaluation,  At surgeon's request and post-op pain management  Laterality: Lower and Left  Prep: chloraprep       Needles:  Injection technique: Single-shot  Needle Type: Stimiplex     Needle Length: 9cm  Needle Gauge: 21     Additional Needles:   Procedures:,,,, ultrasound used (permanent image in chart),,    Narrative:  Start time: 06/09/2023 1:44 PM End time: 06/09/2023 2:04 PM Injection made incrementally with aspirations every 5 mL.  Performed by: Personally  Anesthesiologist: Lewie Loron, MD  Additional Notes: BP cuff, EKG monitors applied. Sedation begun. Artery and nerve location verified with ultrasound. Anesthetic injected incrementally (5ml), slowly, and after negative aspirations under direct u/s guidance. Good fascial/perineural spread. Tolerated well.

## 2023-06-09 NOTE — Transfer of Care (Signed)
Immediate Anesthesia Transfer of Care Note  Patient: Sharon [name]  Procedure(s) Performed: Procedure(s): Revision of Poly Lateral for Left Knee (Left)  Patient Location: PACU  Anesthesia Type:Spinal  Level of Consciousness:  sedated, patient cooperative and responds to stimulation  Airway & Oxygen Therapy:Patient Spontanous Breathing and Patient connected to face mask oxgen  Post-op Assessment:  Report given to PACU RN and Post -op Vital signs reviewed and stable  Post vital signs:  Reviewed and stable  Last Vitals:  Vitals:   06/09/23 1413 06/09/23 1414  BP:    Pulse: (!) 59 (!) 57  Resp: 12 (!) 8  Temp:    SpO2: 95% 93%    Complications: No apparent anesthesia complications

## 2023-06-10 ENCOUNTER — Encounter (HOSPITAL_COMMUNITY): Payer: Self-pay | Admitting: Orthopaedic Surgery

## 2023-06-10 MED ORDER — ORAL CARE MOUTH RINSE: 15.0000 mL | OROMUCOSAL | Status: DC | PRN

## 2023-06-10 MED ORDER — OXYCODONE HCL 5 MG PO TABS: 5.0000 mg | ORAL_TABLET | Freq: Four times a day (QID) | ORAL | 0 refills | Status: DC | PRN

## 2023-06-10 MED ORDER — ASPIRIN 81 MG PO CHEW: 81.0000 mg | CHEWABLE_TABLET | Freq: Two times a day (BID) | ORAL | 0 refills | Status: DC

## 2023-06-10 NOTE — TOC Transition Note (Signed)
Transition of Care [facility]) - CM/SW Discharge Note  Patient Details  Name: Sharon [name] MRN: [id number] Date of Birth: December 05, 1950  Transition of Care [facility]) CM/SW Contact:  Ewing Schlein, LCSW Phone Number: 06/10/2023, 10:48 AM  Clinical Narrative: Patient is expected to discharge home after working with PT. CSW met with patient to confirm discharge plan and needs. Patient will go home with HHPT, which was prearranged with [facility]. Patient has a rolling walker, cane, and 3N1 at home so there are no DME needs at this time. TOC signing off.   Final next level of care: Home w Home Health Services Barriers to Discharge: No Barriers Identified  Patient Goals and CMS Choice CMS Medicare.gov Compare Post Acute Care list provided to:: Patient Choice offered to / list presented to : Patient  Discharge Plan and Services Additional resources added to the After Visit Summary for         DME Arranged: N/A DME Agency: NA HH Arranged: PT HH Agency: [facility] Representative spoke with at [facility] Agency: Prearranged in orthopedist's office  Social Determinants of Health (SDOH) Interventions SDOH Screenings   Food Insecurity: No Food Insecurity (06/10/2023)  Housing: Low Risk  (06/10/2023)  Transportation Needs: No Transportation Needs (06/10/2023)  Utilities: Not At Risk (06/10/2023)  Tobacco Use: Low Risk  (06/09/2023)   Readmission Risk Interventions     No data to display

## 2023-06-10 NOTE — Plan of Care (Signed)
  Problem: Education: Goal: Knowledge of the prescribed therapeutic regimen will improve Outcome: Progressing Goal: Individualized Educational Video(s) Outcome: Progressing   Problem: Activity: Goal: Ability to avoid complications of mobility impairment will improve Outcome: Not Progressing Note: Patient hasn't ambulated at this time waiting on therapy Goal: Range of joint motion will improve Outcome: Not Progressing   Problem: Clinical Measurements: Goal: Postoperative complications will be avoided or minimized Outcome: Progressing   Problem: Pain Management: Goal: Pain level will decrease with appropriate interventions Outcome: Progressing   Problem: Skin Integrity: Goal: Will show signs of wound healing Outcome: Progressing   Problem: Education: Goal: Knowledge of General Education information will improve Description: Including pain rating scale, medication(s)/side effects and non-pharmacologic comfort measures Outcome: Progressing   Problem: Health Behavior/Discharge Planning: Goal: Ability to manage health-related needs will improve Outcome: Progressing   Problem: Clinical Measurements: Goal: Ability to maintain clinical measurements within normal limits will improve Outcome: Progressing Goal: Will remain free from infection Outcome: Progressing Goal: Diagnostic test results will improve Outcome: Progressing Goal: Respiratory complications will improve Outcome: Progressing Goal: Cardiovascular complication will be avoided Outcome: Progressing   Problem: Activity: Goal: Risk for activity intolerance will decrease Outcome: Not Progressing   Problem: Nutrition: Goal: Adequate nutrition will be maintained Outcome: Progressing   Problem: Coping: Goal: Level of anxiety will decrease Outcome: Progressing   Problem: Elimination: Goal: Will not experience complications related to bowel motility Outcome: Progressing Goal: Will not experience complications  related to urinary retention Outcome: Not Progressing Note: Foley removed at 630 am no urination waiting on patient to void   Problem: Pain Managment: Goal: General experience of comfort will improve Outcome: Progressing   Problem: Safety: Goal: Ability to remain free from injury will improve Outcome: Not Progressing Note: Fall risk   Problem: Skin Integrity: Goal: Risk for impaired skin integrity will decrease Outcome: Progressing

## 2023-06-10 NOTE — Plan of Care (Signed)
Problem: Education: Goal: Knowledge of the prescribed therapeutic regimen will improve 06/10/2023 1218 by Aretta Nip, RN Outcome: Adequate for Discharge 06/10/2023 0955 by Aretta Nip, RN Outcome: Progressing Goal: Individualized Educational Video(s) 06/10/2023 1218 by Aretta Nip, RN Outcome: Adequate for Discharge 06/10/2023 0955 by Aretta Nip, RN Outcome: Progressing   Problem: Activity: Goal: Ability to avoid complications of mobility impairment will improve 06/10/2023 1218 by Aretta Nip, RN Outcome: Adequate for Discharge 06/10/2023 [phone] by Aretta Nip, RN Outcome: Not Progressing Note: Patient hasn't ambulated at this time waiting on therapy Goal: Range of joint motion will improve 06/10/2023 1218 by Aretta Nip, RN Outcome: Adequate for Discharge 06/10/2023 0955 by Aretta Nip, RN Outcome: Not Progressing   Problem: Clinical Measurements: Goal: Postoperative complications will be avoided or minimized 06/10/2023 1218 by Aretta Nip, RN Outcome: Adequate for Discharge 06/10/2023 0955 by Aretta Nip, RN Outcome: Progressing   Problem: Pain Management: Goal: Pain level will decrease with appropriate interventions 06/10/2023 1218 by Aretta Nip, RN Outcome: Adequate for Discharge 06/10/2023 0955 by Aretta Nip, RN Outcome: Progressing   Problem: Skin Integrity: Goal: Will show signs of wound healing 06/10/2023 1218 by Aretta Nip, RN Outcome: Adequate for Discharge 06/10/2023 0955 by Aretta Nip, RN Outcome: Progressing   Problem: Education: Goal: Knowledge of General Education information will improve Description: Including pain rating scale, medication(s)/side effects and non-pharmacologic comfort measures 06/10/2023 1218 by Aretta Nip, RN Outcome: Adequate for Discharge 06/10/2023 0955 by Aretta Nip, RN Outcome: Progressing   Problem: Health Behavior/Discharge Planning: Goal: Ability to manage  health-related needs will improve 06/10/2023 1218 by Aretta Nip, RN Outcome: Adequate for Discharge 06/10/2023 0955 by Aretta Nip, RN Outcome: Progressing   Problem: Clinical Measurements: Goal: Ability to maintain clinical measurements within normal limits will improve 06/10/2023 1218 by Aretta Nip, RN Outcome: Adequate for Discharge 06/10/2023 0955 by Aretta Nip, RN Outcome: Progressing Goal: Will remain free from infection 06/10/2023 1218 by Aretta Nip, RN Outcome: Adequate for Discharge 06/10/2023 0955 by Aretta Nip, RN Outcome: Progressing Goal: Diagnostic test results will improve 06/10/2023 1218 by Aretta Nip, RN Outcome: Adequate for Discharge 06/10/2023 0955 by Aretta Nip, RN Outcome: Progressing Goal: Respiratory complications will improve 06/10/2023 1218 by Aretta Nip, RN Outcome: Adequate for Discharge 06/10/2023 0955 by Aretta Nip, RN Outcome: Progressing Goal: Cardiovascular complication will be avoided 06/10/2023 1218 by Aretta Nip, RN Outcome: Adequate for Discharge 06/10/2023 0955 by Aretta Nip, RN Outcome: Progressing   Problem: Activity: Goal: Risk for activity intolerance will decrease 06/10/2023 1218 by Aretta Nip, RN Outcome: Adequate for Discharge 06/10/2023 0955 by Aretta Nip, RN Outcome: Not Progressing   Problem: Nutrition: Goal: Adequate nutrition will be maintained 06/10/2023 1218 by Aretta Nip, RN Outcome: Adequate for Discharge 06/10/2023 0955 by Aretta Nip, RN Outcome: Progressing   Problem: Coping: Goal: Level of anxiety will decrease 06/10/2023 1218 by Aretta Nip, RN Outcome: Adequate for Discharge 06/10/2023 0955 by Aretta Nip, RN Outcome: Progressing   Problem: Elimination: Goal: Will not experience complications related to bowel motility 06/10/2023 1218 by Aretta Nip, RN Outcome: Adequate for Discharge 06/10/2023 0955 by Aretta Nip, RN Outcome:  Progressing Goal: Will not experience complications related to urinary retention 06/10/2023 1218 by Aretta Nip, RN Outcome: Adequate for Discharge 06/10/2023 0955 by Aretta Nip, RN Outcome: Not Progressing Note:  Foley removed at 630 am no urination waiting on patient to void   Problem: Pain Managment: Goal: General experience of comfort will improve 06/10/2023 1218 by Aretta Nip, RN Outcome: Adequate for Discharge 06/10/2023 0955 by Aretta Nip, RN Outcome: Progressing   Problem: Safety: Goal: Ability to remain free from injury will improve 06/10/2023 1218 by Aretta Nip, RN Outcome: Adequate for Discharge 06/10/2023 0955 by Aretta Nip, RN Outcome: Not Progressing Note: Fall risk   Problem: Skin Integrity: Goal: Risk for impaired skin integrity will decrease 06/10/2023 1218 by Aretta Nip, RN Outcome: Adequate for Discharge 06/10/2023 0955 by Aretta Nip, RN Outcome: Progressing

## 2023-06-10 NOTE — Discharge Instructions (Signed)

## 2023-06-10 NOTE — Progress Notes (Signed)
Patient ID: Sharon [name], female   DOB: Sep 20, 1951, 72 y.o.   MRN: [id number] The patient is actually doing very well.  We can discharge her to home this afternoon.

## 2023-06-10 NOTE — Evaluation (Signed)
. Physical Therapy Evaluation Patient Details Name: Sharon [name] MRN: [id number] DOB: 1951-08-17 Today's Date: 06/10/2023  History of Present Illness  72 yo female S/P L TKA revision, poly exchange on  06/09/23. PMH: Bil TKA, HTN,  Clinical Impression  Th patient ambulated x 200' with RW, Reports pain is 2/10. Patient will practice 1 step and should be ready for DC today.        If plan is discharge home, recommend the following: A little help with walking and/or transfers;A little help with bathing/dressing/bathroom;Assistance with cooking/housework;Assist for transportation;Help with stairs or ramp for entrance   Can travel by private vehicle        Equipment Recommendations None recommended by PT  Recommendations for Other Services       Functional Status Assessment       Precautions / Restrictions Precautions Precautions: Knee;Fall Precaution Comments: per comfort Restrictions Weight Bearing Restrictions: No      Mobility  Bed Mobility               General bed mobility comments: in recliner    Transfers Overall transfer level: Needs assistance Equipment used: Rolling walker (2 wheels) Transfers: Sit to/from Stand Sit to Stand: Supervision                Ambulation/Gait Ambulation/Gait assistance: Supervision Gait Distance (Feet): 200 Feet Assistive device: Rolling walker (2 wheels) Gait Pattern/deviations: Step-to pattern, Step-through pattern       General Gait Details: encouraged reciprocal gait  pattern as tolerated  Stairs            Wheelchair Mobility     Tilt Bed    Modified Rankin (Stroke Patients Only)       Balance Overall balance assessment: Mild deficits observed, not formally tested                                           Pertinent Vitals/Pain Pain Assessment Pain Assessment: 0-10 Pain Score: 2  Pain Location: left knee Pain Descriptors / Indicators: Discomfort Pain  Intervention(s): Monitored during session, Repositioned, Premedicated before session    Home Living Family/patient expects to be discharged to:: Private residence Living Arrangements: Spouse/significant other;Children Available Help at Discharge: Available 24 hours/day;Family Type of Home: House Home Access: Stairs to enter Entrance Stairs-Rails: None Entrance Stairs-Number of Steps: 1 Alternate Level Stairs-Number of Steps: 12 Home Layout: Two level;1/2 bath on main level;Able to live on main level with bedroom/bathroom Home Equipment: Rolling Walker (2 wheels);Cane - single point;BSC/3in1;Shower seat - built in      Prior Function Prior Level of Function : Independent/Modified Independent                     Extremity/Trunk Assessment   Upper Extremity Assessment Upper Extremity Assessment: Overall WFL for tasks assessed    Lower Extremity Assessment Lower Extremity Assessment: LLE deficits/detail LLE Deficits / Details: + SLR, knee flexion 10-70       Communication   Communication Communication: No apparent difficulties  Cognition Arousal: Alert Behavior During Therapy: WFL for tasks assessed/performed Overall Cognitive Status: Within Functional Limits for tasks assessed                                          General Comments  Exercises Total Joint Exercises Ankle Circles/Pumps: AROM, Both Quad Sets: AROM, Both Heel Slides: AAROM, Left, 10 reps Hip ABduction/ADduction: AROM, Left, 10 reps Straight Leg Raises: AROM, Left, 10 reps Long Arc Quad: AROM, Left, 10 reps Knee Flexion: AROM, Left, 10 reps   Assessment/Plan    PT Assessment Patient needs continued PT services  PT Problem List Decreased strength;Decreased activity tolerance;Decreased range of motion;Decreased mobility       PT Treatment Interventions DME instruction;Stair training;Therapeutic activities;Gait training;Functional mobility training;Therapeutic  exercise;Patient/family education    PT Goals (Current goals can be found in the Care Plan section)  Acute Rehab PT Goals Patient Stated Goal: home PT Goal Formulation: With patient Time For Goal Achievement: 06/17/23 Potential to Achieve Goals: Good    Frequency 7X/week     Co-evaluation               AM-PAC PT "6 Clicks" Mobility  Outcome Measure Help needed turning from your back to your side while in a flat bed without using bedrails?: None Help needed moving from lying on your back to sitting on the side of a flat bed without using bedrails?: None Help needed moving to and from a bed to a chair (including a wheelchair)?: A Little Help needed standing up from a chair using your arms (e.g., wheelchair or bedside chair)?: A Little Help needed to walk in hospital room?: A Little Help needed climbing 3-5 steps with a railing? : A Little 6 Click Score: 20    End of Session   Activity Tolerance: Patient tolerated treatment well Patient left: in chair;with call bell/phone within reach Nurse Communication: Mobility status PT Visit Diagnosis: Unsteadiness on feet (R26.81)    Time: 1107-1130 PT Time Calculation (min) (ACUTE ONLY): 23 min   Charges:   PT Evaluation $PT Eval Low Complexity: 1 Low PT Treatments $Gait Training: 8-22 mins PT General Charges $$ ACUTE PT VISIT: 1 Visit         Blanchard Kelch PT Acute Rehabilitation Services Office [phone] Weekend [phone]   Rada Hay 06/10/2023, 12:18 PM

## 2023-06-10 NOTE — Progress Notes (Signed)
Patient ID: Sharon [name], female   DOB: 1951-02-08, 72 y.o.   MRN: [id number] The patient is awake and alert and comfortable this morning.  Fortunately surgery went very well yesterday.  We found polyliner wear and had to only upsize her polyethylene liner and change a new polyliner under the patella button.  The tibial and femoral components were intact with no evidence of loosening.  There was no effusion and no evidence of infection.  The knee felt more stable to [place] with the thicker insert.  She does have a knee immobilizer on this morning but that is only for stability purposes from the regional lower extremity block.  She does not need to be in the knee immobilizer for the short-term at all.  If physical therapy feels that is appropriate for her to be out of the knee immobilizer, I will be fine by me.  We will check on her later today to see how she is doing from a mobility standpoint.  She is otherwise comfortable and her vital signs stable.

## 2023-06-10 NOTE — Discharge Summary (Signed)
Patient ID: Sharon [name] MRN: [id number] DOB/AGE: 1951-08-19 72 y.o.  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 (HCC) Active Problems:   Status post revision of total knee replacement, left   Status post revision of total replacement of left knee   Discharge Diagnoses:  Same  Past Medical History:  Diagnosis Date   Cataract    Family history of adverse reaction to anesthesia    "mom did; don't know what happened" (07/20/2018)   Family history of colon cancer    Family history of ovarian cancer    Family history of stomach cancer    Family history of uterine cancer    Fibroid    GERD (gastroesophageal reflux disease)    Heart murmur    "tiny, tiny one" (07/20/2018)   High cholesterol    Hypertension    Migraine    "a few/year; last one was 07/19/2018" (07/20/2018)   Osteoarthritis    "knees, hips, hands, probably in my back" (07/20/2018)   Ringworm    Vitamin D deficiency     Surgeries: Procedure(s): Revision of Poly Lateral for Left Knee on 06/09/2023   Consultants:   Discharged Condition: Improved  Hospital Course: Sharon [name] is an 72 y.o. female who was admitted 06/09/2023 for operative treatment ofLoosening of prosthesis of left total knee replacement (HCC). Patient has severe unremitting pain that affects sleep, daily activities, and work/hobbies. After pre-op clearance the patient was taken to the operating room on 06/09/2023 and underwent  Procedure(s): Revision of Poly Lateral for Left Knee.    Patient was given perioperative antibiotics:  Anti-infectives (From admission, onward)    Start     Dose/Rate Route Frequency Ordered Stop   06/10/23 0600  levofloxacin (LEVAQUIN) IVPB 500 mg        500 mg 100 mL/hr over 60 Minutes Intravenous On call to O.R. 06/09/23 1248 06/10/23 1200   06/10/23 0600  vancomycin (VANCOCIN) IVPB 1000 mg/200 mL premix         1,000 mg 200 mL/hr over 60 Minutes Intravenous On call to O.R. 06/09/23 1248 06/10/23 0732   06/09/23 2200  vancomycin (VANCOCIN) IVPB 1000 mg/200 mL premix        1,000 mg 200 mL/hr over 60 Minutes Intravenous Every 12 hours 06/09/23 1854 06/10/23 0024        Patient was given sequential compression devices, early ambulation, and chemoprophylaxis to prevent DVT.  Patient benefited maximally from hospital stay and there were no complications.    Recent vital signs: Patient Vitals for the past 24 hrs:  BP Temp Temp src Pulse Resp SpO2 Height Weight  06/10/23 0950 (!) 103/54 97.7 F (36.5 C) Oral (!) 53 18 99 % -- --  06/10/23 0555 (!) 108/92 98.6 F (37 C) Oral (!) 56 15 97 % -- --  06/10/23 0110 (!) 110/58 97.6 F (36.4 C) Oral (!) 51 14 96 % -- --  06/09/23 2120 105/61 97.7 F (36.5 C) Oral (!) 56 15 96 % -- --  06/09/23 1902 136/75 97.7 F (36.5 C) Oral (!) 57 18 99 % -- --  06/09/23 1800 133/77 -- -- 60 12 96 % -- --  06/09/23 1715 124/77 (!) 97.5 F (36.4 C) -- 63 14 96 % -- --  06/09/23 1700 120/65 -- -- 61 10 96 % -- --  06/09/23 1645 109/70 -- -- 61 16 100 % -- --  06/09/23  1630 106/64 -- -- 63 12 100 % -- --  06/09/23 1618 (!) 97/59 (!) 97.4 F (36.3 C) -- 67 15 100 % -- --  06/09/23 1426 -- -- -- -- -- -- 5\' 6"  (1.676 m) 77.1 kg  06/09/23 1414 -- -- -- (!) 57 (!) 8 93 % -- --  06/09/23 1413 -- -- -- (!) 59 12 95 % -- --  06/09/23 1412 -- -- -- (!) 59 12 95 % -- --  06/09/23 1411 -- -- -- 60 11 95 % -- --  06/09/23 1410 126/75 -- -- (!) 57 10 95 % -- --  06/09/23 1409 -- -- -- 60 (!) 8 93 % -- --  06/09/23 1408 -- -- -- 61 (!) 8 95 % -- --  06/09/23 1407 -- -- -- 62 (!) 9 96 % -- --  06/09/23 1406 -- -- -- 62 12 96 % -- --  06/09/23 1405 (!) 142/85 -- -- 67 11 94 % -- --  06/09/23 1404 (!) 142/76 98.1 F (36.7 C) Oral 62 (!) 8 95 % -- --  06/09/23 1307 125/74 98 F (36.7 C) Oral 69 14 96 % -- --  06/09/23 1300 -- -- -- -- -- -- 5\' 6"  (1.676 m) --      Recent laboratory studies: No results for input(s): "WBC", "HGB", "HCT", "PLT", "NA", "K", "CL", "CO2", "BUN", "CREATININE", "GLUCOSE", "INR", "CALCIUM" in the last 72 hours.  Invalid input(s): "PT", "2"   Discharge Medications:   Allergies as of 06/10/2023       Reactions   Codeine Other (See Comments)   headache   Pollen Extract Other (See Comments)   Blisters   Tape Other (See Comments)   Blisters   Ceftin Rash   Cefuroxime Axetil Rash        Medication List     TAKE these medications    aspirin 81 MG chewable tablet Chew 1 tablet (81 mg total) by mouth 2 (two) times daily.   atorvastatin 10 MG tablet Commonly known as: LIPITOR Take 10 mg by mouth See admin instructions. Monday, Wednesday, Friday.   CALCIUM 500 + D PO Take 2 each by mouth daily. Gummies   Cranberry 125 MG Tabs Take 1 tablet by mouth daily.   CULTURELLE WOMEN'S WELLNESS PO Take 1 capsule by mouth daily.   cycloSPORINE 0.05 % ophthalmic emulsion Commonly known as: RESTASIS Place 1 drop into both eyes 2 (two) times daily.   diclofenac Sodium 1 % Gel Commonly known as: Voltaren Apply 2 g topically 4 (four) times daily.   fluticasone 50 MCG/ACT nasal spray Commonly known as: FLONASE Place 1 spray into both nostrils daily as needed for allergies (spring/fall).   hydrochlorothiazide 25 MG tablet Commonly known as: HYDRODIURIL Take 25 mg by mouth daily.   methocarbamol 500 MG tablet Commonly known as: ROBAXIN TAKE 1 TABLET(500 MG) BY MOUTH TWICE DAILY AS NEEDED FOR MUSCLE SPASMS What changed: See the new instructions.   oxyCODONE 5 MG immediate release tablet Commonly known as: Oxy IR/ROXICODONE Take 1-2 tablets (5-10 mg total) by mouth every 6 (six) hours as needed for moderate pain (pain score 4-6).   SUMAtriptan 100 MG tablet Commonly known as: IMITREX Take 100 mg by mouth as needed for migraine or headache.   verapamil 240 MG (CO) 24 hr tablet Commonly known as: COVERA  HS Take 240 mg by mouth 2 (two) times daily.   Vitamin D3 25 MCG (1000 UT) Caps Take 2 capsules by  mouth daily.               Durable Medical Equipment  (From admission, onward)           Start     Ordered   06/09/23 1855  DME 3 n 1  Once        06/09/23 1854   06/09/23 1855  DME Walker rolling  Once       Question Answer Comment  Walker: With 5 Inch Wheels   Patient needs a walker to treat with the following condition Status post revision of total replacement of left knee      06/09/23 1854            Diagnostic Studies: DG Knee Left Port  Result Date: 06/09/2023 CLINICAL DATA:  Postop knee replacement. EXAM: PORTABLE LEFT KNEE - 1-2 VIEW COMPARISON:  None Available. FINDINGS: Left knee arthroplasty in expected alignment. No periprosthetic lucency or fracture. Recent postsurgical change includes air and edema in the soft tissues and joint space. Anterior skin staples. IMPRESSION: Left knee arthroplasty without immediate postoperative complication. Electronically Signed   By: Narda Rutherford M.D.   On: 06/09/2023 16:44   ECHOCARDIOGRAM COMPLETE  Result Date: 05/26/2023    ECHOCARDIOGRAM REPORT   Patient Name:   Sharon [name] Date of Exam: 05/26/2023 Medical Rec #:  [id number]         Height:       66.0 in Accession #:    [id number]        Weight:       168.1 lb Date of Birth:  12/22/1950         BSA:          1.857 m Patient Age:    71 years          BP:           126/75 mmHg Patient Gender: F                 HR:           61 bpm. Exam Location:  Outpatient Procedure: 2D Echo, Cardiac Doppler and Color Doppler Indications:    Nonrheumatic aortic valve insufficiency  History:        Patient has prior history of Echocardiogram examinations, most                 recent 04/13/2018. Risk Factors:Hypertension and Dyslipidemia.  Sonographer:    Wallie Char Referring Phys: [id number] LAURA H WILE IMPRESSIONS  1. Left ventricular ejection fraction, by estimation, is 60 to 65%. The  left ventricle has normal function. The left ventricle has no regional wall motion abnormalities. There is mild left ventricular hypertrophy of the basal-septal segment. Left ventricular diastolic parameters are indeterminate.  2. Right ventricular systolic function is normal. The right ventricular size is normal. There is normal pulmonary artery systolic pressure. The estimated right ventricular systolic pressure is 25.3 mmHg.  3. The mitral valve is degenerative. Trivial mitral valve regurgitation. No evidence of mitral stenosis.  4. The aortic valve is tricuspid. Aortic valve regurgitation is mild to moderate. Aortic valve sclerosis/calcification is present, without any evidence of aortic stenosis. Aortic regurgitation PHT measures 894 msec. Aortic valve area, by VTI measures 2.07 cm. Aortic valve mean gradient measures 4.5 mmHg. Aortic valve Vmax measures 1.36 m/s.  5. The inferior vena cava is normal in size with greater than 50% respiratory variability, suggesting right atrial pressure of 3 mmHg. FINDINGS  Left Ventricle: Left  ventricular ejection fraction, by estimation, is 60 to 65%. The left ventricle has normal function. The left ventricle has no regional wall motion abnormalities. The left ventricular internal cavity size was normal in size. There is  mild left ventricular hypertrophy of the basal-septal segment. Left ventricular diastolic parameters are indeterminate. Normal left ventricular filling pressure. Right Ventricle: The right ventricular size is normal. No increase in right ventricular wall thickness. Right ventricular systolic function is normal. There is normal pulmonary artery systolic pressure. The tricuspid regurgitant velocity is 2.36 m/s, and  with an assumed right atrial pressure of 3 mmHg, the estimated right ventricular systolic pressure is 25.3 mmHg. Left Atrium: Left atrial size was normal in size. Right Atrium: Right atrial size was normal in size. Pericardium: Trivial pericardial  effusion is present. The pericardial effusion is circumferential. Mitral Valve: The mitral valve is degenerative in appearance. There is mild calcification of the mitral valve leaflet(s). Trivial mitral valve regurgitation. No evidence of mitral valve stenosis. MV peak gradient, 2.5 mmHg. The mean mitral valve gradient  is 1.0 mmHg. Tricuspid Valve: The tricuspid valve is normal in structure. Tricuspid valve regurgitation is mild . No evidence of tricuspid stenosis. Aortic Valve: The aortic valve is tricuspid. Aortic valve regurgitation is mild to moderate. Aortic regurgitation PHT measures 894 msec. Aortic valve sclerosis/calcification is present, without any evidence of aortic stenosis. Aortic valve mean gradient measures 4.5 mmHg. Aortic valve peak gradient measures 7.4 mmHg. Aortic valve area, by VTI measures 2.07 cm. Pulmonic Valve: The pulmonic valve was normal in structure. Pulmonic valve regurgitation is not visualized. No evidence of pulmonic stenosis. Aorta: The aortic root is normal in size and structure. Venous: The inferior vena cava is normal in size with greater than 50% respiratory variability, suggesting right atrial pressure of 3 mmHg. IAS/Shunts: No atrial level shunt detected by color flow Doppler.  LEFT VENTRICLE PLAX 2D LVIDd:         3.80 cm     Diastology LVIDs:         2.70 cm     LV e' medial:    5.34 cm/s LV PW:         0.90 cm     LV E/e' medial:  11.4 LV IVS:        1.10 cm     LV e' lateral:   9.75 cm/s LVOT diam:     1.90 cm     LV E/e' lateral: 6.2 LV SV:         61 LV SV Index:   33 LVOT Area:     2.84 cm  LV Volumes (MOD) LV vol d, MOD A2C: 69.2 ml LV vol d, MOD A4C: 65.8 ml LV vol s, MOD A2C: 25.8 ml LV vol s, MOD A4C: 25.0 ml LV SV MOD A2C:     43.4 ml LV SV MOD A4C:     65.8 ml LV SV MOD BP:      41.5 ml RIGHT VENTRICLE             IVC RV Basal diam:  2.60 cm     IVC diam: 1.40 cm RV S prime:     16.30 cm/s TAPSE (M-mode): 2.2 cm LEFT ATRIUM             Index        RIGHT  ATRIUM           Index LA diam:        2.60 cm 1.40 cm/m  RA Area:     13.40 cm LA Vol (A2C):   32.2 ml 17.34 ml/m  RA Volume:   27.70 ml  14.91 ml/m LA Vol (A4C):   36.5 ml 19.65 ml/m LA Biplane Vol: 35.5 ml 19.11 ml/m  AORTIC VALVE AV Area (Vmax):    2.30 cm AV Area (Vmean):   2.14 cm AV Area (VTI):     2.07 cm AV Vmax:           136.00 cm/s AV Vmean:          103.250 cm/s AV VTI:            0.294 m AV Peak Grad:      7.4 mmHg AV Mean Grad:      4.5 mmHg LVOT Vmax:         110.50 cm/s LVOT Vmean:        77.850 cm/s LVOT VTI:          0.214 m LVOT/AV VTI ratio: 0.73 AI PHT:            894 msec AR Vena Contracta: 0.40 cm  AORTA Ao Root diam: 3.40 cm Ao Asc diam:  3.60 cm MITRAL VALVE               TRICUSPID VALVE MV Area (PHT): 2.28 cm    TR Peak grad:   22.3 mmHg MV Area VTI:   3.05 cm    TR Vmax:        236.00 cm/s MV Peak grad:  2.5 mmHg MV Mean grad:  1.0 mmHg    SHUNTS MV Vmax:       0.80 m/s    Systemic VTI:  0.21 m MV Vmean:      42.3 cm/s   Systemic Diam: 1.90 cm MV Decel Time: 333 msec MV E velocity: 60.90 cm/s MV A velocity: 74.30 cm/s MV E/A ratio:  0.82 Armanda Magic MD Electronically signed by Armanda Magic MD Signature Date/Time: 05/26/2023/5:35:52 PM    Final     Disposition: Discharge disposition: 01-Home or Self Care          Follow-up Information     Kathryne Hitch, MD Follow up in 2 week(s).   Specialty: Orthopedic Surgery Contact information: [address] [place] [address] [phone]         [facility] Follow up.   Why: [facility] will provide PT in the home after discharge.                 Signed: Kathryne Hitch 06/10/2023, 12:06 PM

## 2023-06-10 NOTE — Progress Notes (Signed)
Physical Therapy Treatment Patient Details Name: Sharon [name] MRN: [id number] DOB: [birth year] Today's Date: 06/10/2023   History of Present Illness 72 yo female S/P L TKA revision, poly exchange on  06/09/23. PMH: Bil TKA, HTN,    PT Comments  Patient practiced 1 step using RW. Patient has met PT goals for Dc today.     If plan is discharge home, recommend the following: A little help with walking and/or transfers;A little help with bathing/dressing/bathroom;Assistance with cooking/housework;Assist for transportation;Help with stairs or ramp for entrance   Can travel by private vehicle        Equipment Recommendations  None recommended by PT    Recommendations for Other Services       Precautions / Restrictions Precautions Precautions: Knee;Fall Precaution Comments: per comfort Restrictions Weight Bearing Restrictions: No     Mobility  Bed Mobility Overal bed mobility: Modified Independent Bed Mobility: Sit to Supine       Sit to supine: Modified independent (Device/Increase time)   General bed mobility comments: in recliner    Transfers Overall transfer level: Needs assistance Equipment used: Rolling walker (2 wheels) Transfers: Sit to/from Stand Sit to Stand: Supervision                Ambulation/Gait Ambulation/Gait assistance: Supervision Gait Distance (Feet): 50 Feet Assistive device: Rolling walker (2 wheels) Gait Pattern/deviations: Step-to pattern, Step-through pattern       General Gait Details: encouraged reciprocal gait  pattern as tolerated   Stairs Stairs: Yes Stairs assistance: Supervision Stair Management: No rails, Forwards Number of Stairs: 1 (x 2) General stair comments: cues for sequence   Wheelchair Mobility     Tilt Bed    Modified Rankin (Stroke Patients Only)       Balance Overall balance assessment: Mild deficits observed, not formally tested                                           Cognition Arousal: Alert Behavior During Therapy: WFL for tasks assessed/performed Overall Cognitive Status: Within Functional Limits for tasks assessed                                          Exercises    General Comments        Pertinent Vitals/Pain Pain Assessment Pain Assessment: 0-10 Pain Score: 4  Pain Location: left knee Pain Descriptors / Indicators: Discomfort, Aching Pain Intervention(s): Monitored during session, Ice applied    Home Living Family/patient expects to be discharged to:: Private residence Living Arrangements: Spouse/significant other;Children Available Help at Discharge: Available 24 hours/day;Family Type of Home: House Home Access: Stairs to enter Entrance Stairs-Rails: None Entrance Stairs-Number of Steps: 1 Alternate Level Stairs-Number of Steps: 12 Home Layout: Two level;1/2 bath on main level;Able to live on main level with bedroom/bathroom Home Equipment: Rolling Walker (2 wheels);Cane - single point;BSC/3in1;Shower seat - built in      Prior Function            PT Goals (current goals can now be found in the care plan section) Acute Rehab PT Goals Patient Stated Goal: home PT Goal Formulation: With patient Time For Goal Achievement: 06/17/23 Potential to Achieve Goals: Good Progress towards PT goals: Progressing toward goals    Frequency    7X/week  PT Plan      Co-evaluation              AM-PAC PT "6 Clicks" Mobility   Outcome Measure  Help needed turning from your back to your side while in a flat bed without using bedrails?: None Help needed moving from lying on your back to sitting on the side of a flat bed without using bedrails?: None Help needed moving to and from a bed to a chair (including a wheelchair)?: A Little Help needed standing up from a chair using your arms (e.g., wheelchair or bedside chair)?: A Little Help needed to walk in hospital room?: A Little Help needed climbing 3-5  steps with a railing? : A Little 6 Click Score: 20    End of Session   Activity Tolerance: Patient tolerated treatment well Patient left: in bed;with call bell/phone within reach Nurse Communication: Mobility status PT Visit Diagnosis: Unsteadiness on feet (R26.81)     Time: 9528-4132 PT Time Calculation (min) (ACUTE ONLY): 10 min  Charges:    $Gait Training: 8-22 mins PT General Charges $$ ACUTE PT VISIT: 1 Visit                     {[facility] PT Acute Rehabilitation Services Office [phone] Weekend pager-[phone]   Rada Hay 06/10/2023, 12:23 PM

## 2023-06-10 NOTE — Plan of Care (Signed)

## 2023-06-11 NOTE — Anesthesia Postprocedure Evaluation (Signed)
Anesthesia Post Note  Patient: Sharon [name]  Procedure(s) Performed: Revision of Poly Lateral for Left Knee (Left: Knee)     Patient location during evaluation: PACU Anesthesia Type: Spinal Level of consciousness: awake and alert Pain management: pain level controlled Vital Signs Assessment: post-procedure vital signs reviewed and stable Respiratory status: spontaneous breathing, nonlabored ventilation and respiratory function stable Cardiovascular status: blood pressure returned to baseline Postop Assessment: no apparent nausea or vomiting, spinal receding, no headache and no backache Anesthetic complications: no   No notable events documented.          Shanda Howells

## 2023-06-12 DIAGNOSIS — Z79891 Long term (current) use of opiate analgesic: Secondary | ICD-10-CM | POA: Diagnosis not present

## 2023-06-12 DIAGNOSIS — K219 Gastro-esophageal reflux disease without esophagitis: Secondary | ICD-10-CM | POA: Diagnosis not present

## 2023-06-12 DIAGNOSIS — M19042 Primary osteoarthritis, left hand: Secondary | ICD-10-CM | POA: Diagnosis not present

## 2023-06-12 DIAGNOSIS — Z9181 History of falling: Secondary | ICD-10-CM | POA: Diagnosis not present

## 2023-06-12 DIAGNOSIS — Z90710 Acquired absence of both cervix and uterus: Secondary | ICD-10-CM | POA: Diagnosis not present

## 2023-06-12 DIAGNOSIS — K7689 Other specified diseases of liver: Secondary | ICD-10-CM | POA: Diagnosis not present

## 2023-06-12 DIAGNOSIS — I10 Essential (primary) hypertension: Secondary | ICD-10-CM | POA: Diagnosis not present

## 2023-06-12 DIAGNOSIS — Z96651 Presence of right artificial knee joint: Secondary | ICD-10-CM | POA: Diagnosis not present

## 2023-06-12 DIAGNOSIS — T84033D Mechanical loosening of internal left knee prosthetic joint, subsequent encounter: Secondary | ICD-10-CM | POA: Diagnosis not present

## 2023-06-12 DIAGNOSIS — K30 Functional dyspepsia: Secondary | ICD-10-CM | POA: Diagnosis not present

## 2023-06-12 DIAGNOSIS — I359 Nonrheumatic aortic valve disorder, unspecified: Secondary | ICD-10-CM | POA: Diagnosis not present

## 2023-06-12 DIAGNOSIS — Z9049 Acquired absence of other specified parts of digestive tract: Secondary | ICD-10-CM | POA: Diagnosis not present

## 2023-06-12 DIAGNOSIS — I8393 Asymptomatic varicose veins of bilateral lower extremities: Secondary | ICD-10-CM | POA: Diagnosis not present

## 2023-06-12 DIAGNOSIS — E559 Vitamin D deficiency, unspecified: Secondary | ICD-10-CM | POA: Diagnosis not present

## 2023-06-12 DIAGNOSIS — M1611 Unilateral primary osteoarthritis, right hip: Secondary | ICD-10-CM | POA: Diagnosis not present

## 2023-06-12 DIAGNOSIS — M19041 Primary osteoarthritis, right hand: Secondary | ICD-10-CM | POA: Diagnosis not present

## 2023-06-12 DIAGNOSIS — E7800 Pure hypercholesterolemia, unspecified: Secondary | ICD-10-CM | POA: Diagnosis not present

## 2023-06-12 DIAGNOSIS — G43909 Migraine, unspecified, not intractable, without status migrainosus: Secondary | ICD-10-CM | POA: Diagnosis not present

## 2023-06-12 DIAGNOSIS — Z7982 Long term (current) use of aspirin: Secondary | ICD-10-CM | POA: Diagnosis not present

## 2023-06-12 DIAGNOSIS — Z96642 Presence of left artificial hip joint: Secondary | ICD-10-CM | POA: Diagnosis not present

## 2023-06-19 ENCOUNTER — Other Ambulatory Visit: Payer: Self-pay | Admitting: Orthopaedic Surgery

## 2023-06-19 ENCOUNTER — Telehealth: Payer: Self-pay | Admitting: Orthopaedic Surgery

## 2023-06-19 MED ORDER — OXYCODONE HCL 5 MG PO TABS: 5.0000 mg | ORAL_TABLET | Freq: Four times a day (QID) | ORAL | 0 refills | Status: DC | PRN

## 2023-06-19 NOTE — Telephone Encounter (Signed)
Patient called. Would like a refill on oxycodone called in. Her cb# is [phone]

## 2023-06-24 ENCOUNTER — Ambulatory Visit (INDEPENDENT_AMBULATORY_CARE_PROVIDER_SITE_OTHER): Payer: PPO | Admitting: Orthopaedic Surgery

## 2023-06-24 ENCOUNTER — Encounter: Payer: Self-pay | Admitting: Orthopaedic Surgery

## 2023-06-24 DIAGNOSIS — Z96652 Presence of left artificial knee joint: Secondary | ICD-10-CM

## 2023-06-24 NOTE — Progress Notes (Signed)
The patient comes in today for first postoperative visit status post a left knee polyliner exchange with upsizing the poly from a 10 mm thickness to a 12.5 mm thickness.  We also changed out the polyliner under her patella.  She does feel like she is making good progress with range of motion and strength and the knee does not feel unstable.  Originally it was felt that there was potentially loosening of the metal components of the knee but we did not find that the case interoperative.  Her left knee incision looks good.  The staples have been removed Steri-Strips applied.  She actually has pretty good range of motion of that left knee.  From my standpoint she can drive.  She can transition to a cane as well.  She would like to try outpatient physical therapy with [facility] so I did give her a prescription for that.  I would like to see her back in 4 weeks to see how she is doing overall but no x-rays are needed.

## 2023-07-01 DIAGNOSIS — R2689 Other abnormalities of gait and mobility: Secondary | ICD-10-CM | POA: Diagnosis not present

## 2023-07-01 DIAGNOSIS — M25562 Pain in left knee: Secondary | ICD-10-CM | POA: Diagnosis not present

## 2023-07-06 ENCOUNTER — Telehealth: Payer: Self-pay | Admitting: Orthopaedic Surgery

## 2023-07-06 ENCOUNTER — Other Ambulatory Visit: Payer: Self-pay

## 2023-07-06 DIAGNOSIS — R2689 Other abnormalities of gait and mobility: Secondary | ICD-10-CM | POA: Diagnosis not present

## 2023-07-06 DIAGNOSIS — M25551 Pain in right hip: Secondary | ICD-10-CM

## 2023-07-06 DIAGNOSIS — M25562 Pain in left knee: Secondary | ICD-10-CM | POA: Diagnosis not present

## 2023-07-06 NOTE — Telephone Encounter (Signed)
Pt called requesting a call back. Pt asking when she can swim and also when can she have an cortisone injection in right hip. Pt phone number is [phone].

## 2023-07-06 NOTE — Telephone Encounter (Signed)
She wants injection with [place] in her hip

## 2023-07-08 DIAGNOSIS — H26492 Other secondary cataract, left eye: Secondary | ICD-10-CM | POA: Diagnosis not present

## 2023-07-08 DIAGNOSIS — R2689 Other abnormalities of gait and mobility: Secondary | ICD-10-CM | POA: Diagnosis not present

## 2023-07-08 DIAGNOSIS — H16223 Keratoconjunctivitis sicca, not specified as Sjogren's, bilateral: Secondary | ICD-10-CM | POA: Diagnosis not present

## 2023-07-08 DIAGNOSIS — M25562 Pain in left knee: Secondary | ICD-10-CM | POA: Diagnosis not present

## 2023-07-08 DIAGNOSIS — Z961 Presence of intraocular lens: Secondary | ICD-10-CM | POA: Diagnosis not present

## 2023-07-10 ENCOUNTER — Encounter: Payer: Self-pay | Admitting: Orthopaedic Surgery

## 2023-07-13 ENCOUNTER — Ambulatory Visit: Payer: PPO | Admitting: Physical Medicine and Rehabilitation

## 2023-07-14 ENCOUNTER — Encounter: Payer: Self-pay | Admitting: Physical Medicine and Rehabilitation

## 2023-07-14 ENCOUNTER — Ambulatory Visit: Payer: PPO | Admitting: Physical Medicine and Rehabilitation

## 2023-07-14 DIAGNOSIS — M25562 Pain in left knee: Secondary | ICD-10-CM | POA: Diagnosis not present

## 2023-07-14 DIAGNOSIS — G8929 Other chronic pain: Secondary | ICD-10-CM

## 2023-07-14 DIAGNOSIS — R2689 Other abnormalities of gait and mobility: Secondary | ICD-10-CM | POA: Diagnosis not present

## 2023-07-14 DIAGNOSIS — R1031 Right lower quadrant pain: Secondary | ICD-10-CM | POA: Diagnosis not present

## 2023-07-14 DIAGNOSIS — M25551 Pain in right hip: Secondary | ICD-10-CM

## 2023-07-14 NOTE — Progress Notes (Unsigned)
Functional Pain Scale - descriptive words and definitions  Uncomfortable (3)  Pain is present but can complete all ADL's/sleep is slightly affected and passive distraction only gives marginal relief. Mild range order  Average Pain 2   Right hip pain. Pain worse when climbing stairs

## 2023-07-14 NOTE — Progress Notes (Unsigned)
Sharon [name] - 72 y.o. female MRN [id number]  Date of birth: Jun 26, 1951  Office Visit Note: Visit Date: 07/14/2023 PCP: Melida Quitter, MD Referred by: Melida Quitter, MD  Subjective: Chief Complaint  Patient presents with   Right Hip - Pain   HPI: Sharon [name] is a 72 y.o. female who comes in today for evaluation of chronic, worsening and severe right sided lateral hip and groin pain. Pain ongoing for several years, worsened several weeks ago post left knee revision with Dr. Doneen Poisson. Her pain worsens with movement and activity, especially walking up stairs and getting in/out of car. She describes pain as sharp and sore sensation, currently rates as 5 out of 10. Some relief of pain with home exercise regimen, rest and use of medications. She continues with physical therapy post op. Right hip x-rays from 2023 exhibits degenerative changes to right hip. She underwent right intra-articular hip injection in our office in 2023, unable to recall if this injection was beneficial in alleviating her pain. We have also seen her in the past for lower back pain. She has undergone multiple epidural steroid injections with significant relief of pain. States her pain today feels different, localized to right groin region. Dr. Magnus Ivan did place order for right intra-articular hip injection on 07/06/2023 that has been approved, patient came into office today as she had questions regarding possible hip vs bursa issue. Patient currently uses cane to assist with ambulation. Patient denies focal weakness, numbness and tingling. No recent trauma or falls.    Review of Systems  Musculoskeletal:  Positive for joint pain.  Neurological:  Negative for tingling, sensory change, focal weakness and weakness.  All other systems reviewed and are negative.  Otherwise per HPI.  Assessment & Plan: Visit Diagnoses:    ICD-10-CM   1. Pain in right hip  M25.551     2. Groin pain, chronic, right  R10.31     G89.29        Plan: Findings:  Chronic, worsening and severe right sided lateral hip and groin pain. No radicular pain down the legs. Patient continues to have severe pain despite good conservative therapies such as home exercise regimen, rest and use of medications. Patients clinical presentation and exam are consistent with intrinsic right hip issue. Degenerative changes noted to right hip on x-rays from 2023.Marland Kitchen Next step is to perform diagnostic and hopefully therapeutic right intra-articular hip injection under fluoroscopic guidance. If good relief of pain with injection we can repeat infrequently as needed. Would refer back to Dr. Magnus Ivan for continued management. Patient has no questions regarding injection procedure, we will get her back as soon as possible for procedure. No red flag symptoms noted upon exam today.     Meds & Orders: No orders of the defined types were placed in this encounter.  No orders of the defined types were placed in this encounter.   Follow-up: Return for Right intra-articular hip injection.   Procedures: No procedures performed      Clinical History: CLINICAL DATA:  Low back pain with right hip and leg pain for 1 month. Right foot numbness. No known injury or prior relevant surgery.   EXAM: MRI LUMBAR SPINE WITHOUT CONTRAST   TECHNIQUE: Multiplanar, multisequence MR imaging of the lumbar spine was performed. No intravenous contrast was administered.   COMPARISON:  Lumbar MRI 01/26/2021 and 11/04/2017.   FINDINGS: Segmentation: Conventional anatomy assumed, with the last open disc space designated L5-S1.Concordant with previous imaging.  Alignment: Convex right scoliosis centered at L3, similar to previous study. Normal lateral alignment.   Vertebrae: No worrisome osseous lesion, acute fracture or pars defect. Previously demonstrated endplate degenerative changes at L1-2 have mildly improved. Mild sacroiliac degenerative  changes bilaterally.   Conus medullaris: Extends to the L1-2 level and appears normal.   Paraspinal and other soft tissues: No significant paraspinal findings. Simple appearing cyst in the upper pole of the right kidney for which no follow-up imaging is recommended.   Disc levels:   No significant disc space findings at T11-12 or T12-L1.   L1-2: Chronic spondylosis with loss of disc height, annular disc bulging and endplate osteophytes. Endplate edema noted previously has improved. Mild left foraminal narrowing without nerve root encroachment.   L2-3: Preserved disc height. Mild disc bulging and facet hypertrophy. No spinal stenosis or nerve root encroachment.   L3-4: Stable mild loss of disc height with annular disc bulging eccentric to the left. Mild facet and ligamentous hypertrophy. Stable mild narrowing of the left lateral recess and left foramen without nerve root encroachment.   L4-5: Mild loss of disc height with annular disc bulging. Moderate facet and ligamentous hypertrophy. Stable mild lateral recess narrowing bilaterally. The foramina appear sufficiently patent.   L5-S1: Chronic loss of disc height with annular disc bulging and endplate osteophytes asymmetric to the right. Mild facet and ligamentous hypertrophy. There is right foraminal narrowing which appears progressive with probable right L5 nerve root encroachment. Stable mild narrowing of the right lateral recess and left foramen.   IMPRESSION: 1. Compared with previous MRI from 2018, there is progressive right foraminal narrowing at L5-S1 due to disc bulging, endplate osteophytes and facet hypertrophy. There is probable right L5 nerve root encroachment. 2. No other significant changes, spinal stenosis or nerve root encroachment. Stable mild lateral recess narrowing bilaterally at L4-5 and mild narrowing of the left lateral recess and left foramen at L3-4. 3. Chronic spondylosis at L1-2 with improved  endplate edema.     Electronically Signed   By: Carey Bullocks M.D.   On: 07/26/2022 13:59   She reports that she has never smoked. She has never used smokeless tobacco. No results for input(s): "HGBA1C", "LABURIC" in the last 8760 hours.  Objective:  VS:  HT:    WT:   BMI:     BP:   HR: bpm  TEMP: ( )  RESP:  Physical Exam Vitals and nursing note reviewed.  HENT:     Head: Normocephalic and atraumatic.     Right Ear: External ear normal.     Left Ear: External ear normal.     Nose: Nose normal.     Mouth/Throat:     Mouth: Mucous membranes are moist.  Eyes:     Extraocular Movements: Extraocular movements intact.  Cardiovascular:     Rate and Rhythm: Normal rate.     Pulses: Normal pulses.  Pulmonary:     Effort: Pulmonary effort is normal.  Abdominal:     General: Abdomen is flat. There is no distension.  Musculoskeletal:        General: Tenderness present.     Cervical back: Normal range of motion.     Comments: Pt has no significant limitations in mobility. No pain upon palpation of greater trochanters. Pain noted with internal/external rotation of right hip. Distal sensation intact.  Skin:    General: Skin is warm and dry.     Capillary Refill: Capillary refill takes less than 2 seconds.  Neurological:  General: No focal deficit present.     Mental Status: She is alert and oriented to person, place, and time.  Psychiatric:        Mood and Affect: Mood normal.        Behavior: Behavior normal.     Ortho Exam  Imaging: No results found.  Past Medical/Family/Surgical/Social History: Medications & Allergies reviewed per EMR, new medications updated. Patient Active Problem List   Diagnosis Date Noted   Status post revision of total replacement of left knee 06/09/2023   Liver cyst 01/28/2022   Pain in left shoulder 01/10/2021   Neck pain, acute 04/11/2020   Asymptomatic varicose veins of bilateral lower extremities 02/09/2020   Functional dyspepsia  11/22/2019   Aortic valve disorder 11/17/2019   Family history of cancer 11/17/2019   Hyperlipidemia 11/17/2019   Lactose intolerance 11/17/2019   Migraine, unspecified, not intractable, without status migrainosus 11/17/2019   Pain in right ankle and joints of right foot 07/26/2019   Status post total replacement of left hip 07/20/2018   Unilateral primary osteoarthritis, left hip 05/25/2018   Genetic testing 11/14/2016   Family history of ovarian cancer    Family history of colon cancer    Family history of uterine cancer    Family history of stomach cancer    Osteoarthritis of left knee 04/03/2015   History of left knee replacement 04/03/2015   Gait abnormality 12/22/2012   Swelling of ankle 12/22/2012   Hypertension    Headache(784.0)    Arthritis    Vitamin D deficiency    Past Medical History:  Diagnosis Date   Cataract    Family history of adverse reaction to anesthesia    "mom did; don't know what happened" (07/20/2018)   Family history of colon cancer    Family history of ovarian cancer    Family history of stomach cancer    Family history of uterine cancer    Fibroid    GERD (gastroesophageal reflux disease)    Heart murmur    "tiny, tiny one" (07/20/2018)   High cholesterol    Hypertension    Migraine    "a few/year; last one was 07/19/2018" (07/20/2018)   Osteoarthritis    "knees, hips, hands, probably in my back" (07/20/2018)   Ringworm    Vitamin D deficiency    Family History  Problem Relation Age of Onset   Ovarian cancer Mother 42   Heart disease Maternal Grandfather    Hypertension Paternal Grandmother    Ovarian cancer Cousin        maternal first cousin dx in her 29s   Heart disease Father    Atrial fibrillation Father    Heart disease Brother    Heart disease Brother    Colon cancer Maternal Aunt        dx in her 55s-60   Diabetes Paternal Aunt    Ovarian cancer Maternal Grandmother        dx in her mid 26s   Cancer Maternal Grandmother         OVARIAN AND UTERINE   Uterine cancer Maternal Grandmother        dx in her early 53s   Stomach cancer Maternal Uncle        dx in his 56s-80s   Stroke Maternal Uncle    Leukemia Paternal Uncle    Past Surgical History:  Procedure Laterality Date   APPENDECTOMY  1969   BUNIONECTOMY Bilateral    EYE SURGERY Bilateral  JOINT REPLACEMENT     KNEE ARTHROSCOPY Left 11/2013   Precancerous lesion excised     "arm"   TONSILLECTOMY     TOTAL ABDOMINAL HYSTERECTOMY  1990   TAH BSO   TOTAL HIP ARTHROPLASTY Left 07/20/2018   TOTAL HIP ARTHROPLASTY Left 07/20/2018   Procedure: LEFT TOTAL HIP ARTHROPLASTY ANTERIOR APPROACH;  Surgeon: Kathryne Hitch, MD;  Location: [facility] OR;  Service: Orthopedics;  Laterality: Left;   TOTAL KNEE ARTHROPLASTY Right 2012   TOTAL KNEE ARTHROPLASTY Left 04/03/2015   Procedure: TOTAL KNEE ARTHROPLASTY;  Surgeon: Valeria Batman, MD;  Location: [facility] OR;  Service: Orthopedics;  Laterality: Left;   TOTAL KNEE REVISION Left 06/09/2023   Procedure: Revision of Poly Lateral for Left Knee;  Surgeon: Kathryne Hitch, MD;  Location: [facility] ORS;  Service: Orthopedics;  Laterality: Left;   Social History   Occupational History   Not on file  Tobacco Use   Smoking status: Never   Smokeless tobacco: Never  Vaping Use   Vaping status: Never Used  Substance and Sexual Activity   Alcohol use: Yes    Alcohol/week: 1.0 - 2.0 standard drink of alcohol    Types: 1 - 2 Standard drinks or equivalent per week    Comment: rarely   Drug use: No   Sexual activity: Not Currently    Partners: Male    Birth control/protection: Surgical, Post-menopausal

## 2023-07-16 ENCOUNTER — Ambulatory Visit: Payer: PPO | Admitting: Physical Medicine and Rehabilitation

## 2023-07-16 ENCOUNTER — Other Ambulatory Visit: Payer: Self-pay

## 2023-07-16 DIAGNOSIS — M25551 Pain in right hip: Secondary | ICD-10-CM | POA: Diagnosis not present

## 2023-07-16 MED ORDER — TRIAMCINOLONE ACETONIDE 40 MG/ML IJ SUSP
40.0000 mg | INTRAMUSCULAR | Status: AC | PRN
Start: 2023-07-16 — End: 2023-07-16
  Administered 2023-07-16: 40 mg via INTRA_ARTICULAR

## 2023-07-16 MED ORDER — BUPIVACAINE HCL 0.25 % IJ SOLN
4.0000 mL | INTRAMUSCULAR | Status: AC | PRN
Start: 2023-07-16 — End: 2023-07-16
  Administered 2023-07-16: 4 mL via INTRA_ARTICULAR

## 2023-07-16 NOTE — Progress Notes (Signed)
Sharon [name] - 72 y.o. female MRN [id number]  Date of birth: 09-05-1951  Office Visit Note: Visit Date: 07/16/2023 PCP: Melida Quitter, MD Referred by: Melida Quitter, MD  Subjective: Chief Complaint  Patient presents with   Right Hip - Pain   HPI:  Sharon [name] is a 72 y.o. female who comes in today at the request of Ellin Goodie, FNP and Dr. Doneen Poisson for planned Right anesthetic hip arthrogram with fluoroscopic guidance.  The patient has failed conservative care including home exercise, medications, time and activity modification.  This injection will be diagnostic and hopefully therapeutic.  Please see requesting physician notes for further details and justification.  Depending on relief would look at lumbar injection.  This does not seem to be a bursitis.   ROS Otherwise per HPI.  Assessment & Plan: Visit Diagnoses:    ICD-10-CM   1. Pain in right hip  M25.551 XR C-ARM NO REPORT    Large Joint Inj: R hip joint      Plan: No additional findings.   Meds & Orders: No orders of the defined types were placed in this encounter.   Orders Placed This Encounter  Procedures   Large Joint Inj: R hip joint   XR C-ARM NO REPORT    Follow-up: Return if symptoms worsen or fail to improve.   Procedures: Large Joint Inj: R hip joint on 07/16/2023 8:17 AM Indications: diagnostic evaluation and pain Details: 22 G 3.5 in needle, fluoroscopy-guided anterior approach  Arthrogram: No  Medications: 4 mL bupivacaine 0.25 %; 40 mg triamcinolone acetonide 40 MG/ML Outcome: tolerated well, no immediate complications  There was excellent flow of contrast producing a partial arthrogram of the hip. The patient did have relief of symptoms during the anesthetic phase of the injection. Procedure, treatment alternatives, risks and benefits explained, specific risks discussed. Consent was given by the patient. Immediately prior to procedure a time out was called to verify the  correct patient, procedure, equipment, support staff and site/side marked as required. Patient was prepped and draped in the usual sterile fashion.          Clinical History: CLINICAL DATA:  Low back pain with right hip and leg pain for 1 month. Right foot numbness. No known injury or prior relevant surgery.   EXAM: MRI LUMBAR SPINE WITHOUT CONTRAST   TECHNIQUE: Multiplanar, multisequence MR imaging of the lumbar spine was performed. No intravenous contrast was administered.   COMPARISON:  Lumbar MRI 01/26/2021 and 11/04/2017.   FINDINGS: Segmentation: Conventional anatomy assumed, with the last open disc space designated L5-S1.Concordant with previous imaging.   Alignment: Convex right scoliosis centered at L3, similar to previous study. Normal lateral alignment.   Vertebrae: No worrisome osseous lesion, acute fracture or pars defect. Previously demonstrated endplate degenerative changes at L1-2 have mildly improved. Mild sacroiliac degenerative changes bilaterally.   Conus medullaris: Extends to the L1-2 level and appears normal.   Paraspinal and other soft tissues: No significant paraspinal findings. Simple appearing cyst in the upper pole of the right kidney for which no follow-up imaging is recommended.   Disc levels:   No significant disc space findings at T11-12 or T12-L1.   L1-2: Chronic spondylosis with loss of disc height, annular disc bulging and endplate osteophytes. Endplate edema noted previously has improved. Mild left foraminal narrowing without nerve root encroachment.   L2-3: Preserved disc height. Mild disc bulging and facet hypertrophy. No spinal stenosis or nerve root encroachment.   L3-4:  Stable mild loss of disc height with annular disc bulging eccentric to the left. Mild facet and ligamentous hypertrophy. Stable mild narrowing of the left lateral recess and left foramen without nerve root encroachment.   L4-5: Mild loss of disc height  with annular disc bulging. Moderate facet and ligamentous hypertrophy. Stable mild lateral recess narrowing bilaterally. The foramina appear sufficiently patent.   L5-S1: Chronic loss of disc height with annular disc bulging and endplate osteophytes asymmetric to the right. Mild facet and ligamentous hypertrophy. There is right foraminal narrowing which appears progressive with probable right L5 nerve root encroachment. Stable mild narrowing of the right lateral recess and left foramen.   IMPRESSION: 1. Compared with previous MRI from 2018, there is progressive right foraminal narrowing at L5-S1 due to disc bulging, endplate osteophytes and facet hypertrophy. There is probable right L5 nerve root encroachment. 2. No other significant changes, spinal stenosis or nerve root encroachment. Stable mild lateral recess narrowing bilaterally at L4-5 and mild narrowing of the left lateral recess and left foramen at L3-4. 3. Chronic spondylosis at L1-2 with improved endplate edema.     Electronically Signed   By: Carey Bullocks M.D.   On: 07/26/2022 13:59     Objective:  VS:  HT:    WT:   BMI:     BP:   HR: bpm  TEMP: ( )  RESP:  Physical Exam Vitals and nursing note reviewed.  Constitutional:      General: She is not in acute distress.    Appearance: Normal appearance. She is well-developed. She is not ill-appearing.  HENT:     Head: Normocephalic and atraumatic.     Right Ear: External ear normal.     Left Ear: External ear normal.  Eyes:     Extraocular Movements: Extraocular movements intact.     Conjunctiva/sclera: Conjunctivae normal.     Pupils: Pupils are equal, round, and reactive to light.  Cardiovascular:     Rate and Rhythm: Normal rate.     Pulses: Normal pulses.  Pulmonary:     Effort: Pulmonary effort is normal. No respiratory distress.  Abdominal:     General: There is no distension.     Palpations: Abdomen is soft.  Musculoskeletal:        General:  Tenderness present.     Cervical back: Neck supple.     Right lower leg: No edema.     Left lower leg: No edema.     Comments: Patient has good distal strength with no pain over the greater trochanters.  No clonus or focal weakness.  Ambulates with a cane.  Skin:    General: Skin is warm and dry.     Findings: No erythema, lesion or rash.  Neurological:     General: No focal deficit present.     Mental Status: She is alert and oriented to person, place, and time.     Cranial Nerves: No cranial nerve deficit.     Sensory: No sensory deficit.     Motor: No weakness or abnormal muscle tone.     Coordination: Coordination normal.     Gait: Gait abnormal.  Psychiatric:        Mood and Affect: Mood normal.        Behavior: Behavior normal.      Imaging: XR C-ARM NO REPORT  Result Date: 07/16/2023 Please see Notes tab for imaging impression.

## 2023-07-16 NOTE — Progress Notes (Signed)
Functional Pain Scale - descriptive words and definitions  Uncomfortable (3)  Pain is present but can complete all ADL's/sleep is slightly affected and passive distraction only gives marginal relief. Mild range order  Average Pain 3-4   +Driver, -BT, -Dye Allergies.  Right hip pain

## 2023-07-20 DIAGNOSIS — R2689 Other abnormalities of gait and mobility: Secondary | ICD-10-CM | POA: Diagnosis not present

## 2023-07-20 DIAGNOSIS — M25562 Pain in left knee: Secondary | ICD-10-CM | POA: Diagnosis not present

## 2023-07-21 DIAGNOSIS — N390 Urinary tract infection, site not specified: Secondary | ICD-10-CM | POA: Diagnosis not present

## 2023-07-22 ENCOUNTER — Encounter: Payer: PPO | Admitting: Sports Medicine

## 2023-07-22 DIAGNOSIS — N390 Urinary tract infection, site not specified: Secondary | ICD-10-CM | POA: Diagnosis not present

## 2023-07-22 DIAGNOSIS — M25441 Effusion, right hand: Secondary | ICD-10-CM | POA: Diagnosis not present

## 2023-07-22 DIAGNOSIS — S60221A Contusion of right hand, initial encounter: Secondary | ICD-10-CM | POA: Diagnosis not present

## 2023-07-24 DIAGNOSIS — R2689 Other abnormalities of gait and mobility: Secondary | ICD-10-CM | POA: Diagnosis not present

## 2023-07-24 DIAGNOSIS — M25562 Pain in left knee: Secondary | ICD-10-CM | POA: Diagnosis not present

## 2023-07-27 ENCOUNTER — Encounter: Payer: PPO | Admitting: Orthopaedic Surgery

## 2023-07-29 ENCOUNTER — Ambulatory Visit: Payer: PPO | Admitting: Orthopaedic Surgery

## 2023-07-29 ENCOUNTER — Encounter: Payer: Self-pay | Admitting: Orthopaedic Surgery

## 2023-07-29 DIAGNOSIS — Z96652 Presence of left artificial knee joint: Secondary | ICD-10-CM

## 2023-07-29 DIAGNOSIS — M7061 Trochanteric bursitis, right hip: Secondary | ICD-10-CM | POA: Diagnosis not present

## 2023-07-29 MED ORDER — LIDOCAINE HCL 1 % IJ SOLN
3.0000 mL | INTRAMUSCULAR | Status: AC | PRN
Start: 2023-07-29 — End: 2023-07-29
  Administered 2023-07-29: 3 mL

## 2023-07-29 MED ORDER — METHYLPREDNISOLONE ACETATE 40 MG/ML IJ SUSP
40.0000 mg | INTRAMUSCULAR | Status: AC | PRN
Start: 2023-07-29 — End: 2023-07-29
  Administered 2023-07-29: 40 mg via INTRA_ARTICULAR

## 2023-07-29 NOTE — Progress Notes (Signed)
HPI: Sharon [name] 72 year old female status post left knee revision/poly exchange 06/09/2023.  She states overall her knee is doing well.  She is having no significant pain in the knee.  Notes that some pain at times depending on activity.  Taking no medication for the pain.  She is going to physical therapy.  She does note some numbness about the incision area.  Recently underwent right hip intra-articular injection by Dr. Alvester Morin the end of September.  She notes that the intra-articular injection right hip gave her good relief for a few days and then pain slowly came back.  She points to the lateral aspect of the right hip as a source of her pain.  No real radicular symptoms down the leg.  No new injury.  Review of systems: See HPI otherwise negative or noncontributory.  Physical exam: General Well-developed well-nourished female no acute distress mood affect appropriate.  Psych alert and oriented x 3  Left knee full extension full flexion.  No instability valgus varus stressing.  Anterior drawer is negative.  Surgical incisions healing well no signs of infection or wound dehiscence.  Right hip good range of motion without pain.  Tenderness over the right trochanteric region.  Impression: Status post left knee poly liner exchange with upsizing  Plan: Will have her continue work on range of motion strengthening left knee.  Also work on scar tissue mobilization.  Follow-up with [place] in regards to the knee at 6 months postop we will obtain AP and lateral view of the knee at that time.  She will follow-up with [place] sooner if there is any questions or concerns. In regards to her right hip she is shown IT band stretching exercises she is to stop all abduction hip exercises.  Offered cortisone injection for the right hip trochanteric bursitis she was agreeable.  Questions were encouraged and answered.     Procedure Note  Patient: Sharon [name]             Date of Birth: [birth year]           MRN:  [id number]             Visit Date: 07/29/2023  Procedures: Visit Diagnoses:  1. Trochanteric bursitis, right hip   2. Status post revision of total replacement of left knee     Large Joint Inj: R greater trochanter on 07/29/2023 11:55 AM Indications: pain Details: 22 G 1.5 in needle, lateral approach  Arthrogram: No  Medications: 3 mL lidocaine 1 %; 40 mg methylPREDNISolone acetate 40 MG/ML Outcome: tolerated well, no immediate complications Procedure, treatment alternatives, risks and benefits explained, specific risks discussed. Consent was given by the patient. Immediately prior to procedure a time out was called to verify the correct patient, procedure, equipment, support staff and site/side marked as required. Patient was prepped and draped in the usual sterile fashion.

## 2023-07-30 ENCOUNTER — Encounter: Payer: PPO | Admitting: Family Medicine

## 2023-08-04 DIAGNOSIS — N390 Urinary tract infection, site not specified: Secondary | ICD-10-CM | POA: Diagnosis not present

## 2023-08-11 DIAGNOSIS — R2689 Other abnormalities of gait and mobility: Secondary | ICD-10-CM | POA: Diagnosis not present

## 2023-08-11 DIAGNOSIS — M25562 Pain in left knee: Secondary | ICD-10-CM | POA: Diagnosis not present

## 2023-08-14 DIAGNOSIS — R2689 Other abnormalities of gait and mobility: Secondary | ICD-10-CM | POA: Diagnosis not present

## 2023-08-14 DIAGNOSIS — M25562 Pain in left knee: Secondary | ICD-10-CM | POA: Diagnosis not present

## 2023-08-18 DIAGNOSIS — R2689 Other abnormalities of gait and mobility: Secondary | ICD-10-CM | POA: Diagnosis not present

## 2023-08-18 DIAGNOSIS — M25562 Pain in left knee: Secondary | ICD-10-CM | POA: Diagnosis not present

## 2023-08-20 DIAGNOSIS — M25562 Pain in left knee: Secondary | ICD-10-CM | POA: Diagnosis not present

## 2023-08-20 DIAGNOSIS — R2689 Other abnormalities of gait and mobility: Secondary | ICD-10-CM | POA: Diagnosis not present

## 2023-08-26 DIAGNOSIS — R2689 Other abnormalities of gait and mobility: Secondary | ICD-10-CM | POA: Diagnosis not present

## 2023-08-26 DIAGNOSIS — M25562 Pain in left knee: Secondary | ICD-10-CM | POA: Diagnosis not present

## 2023-08-28 DIAGNOSIS — E785 Hyperlipidemia, unspecified: Secondary | ICD-10-CM | POA: Diagnosis not present

## 2023-08-28 DIAGNOSIS — R2689 Other abnormalities of gait and mobility: Secondary | ICD-10-CM | POA: Diagnosis not present

## 2023-08-28 DIAGNOSIS — M25562 Pain in left knee: Secondary | ICD-10-CM | POA: Diagnosis not present

## 2023-08-28 DIAGNOSIS — R1013 Epigastric pain: Secondary | ICD-10-CM | POA: Diagnosis not present

## 2023-08-28 DIAGNOSIS — I10 Essential (primary) hypertension: Secondary | ICD-10-CM | POA: Diagnosis not present

## 2023-09-01 DIAGNOSIS — R2689 Other abnormalities of gait and mobility: Secondary | ICD-10-CM | POA: Diagnosis not present

## 2023-09-01 DIAGNOSIS — M25562 Pain in left knee: Secondary | ICD-10-CM | POA: Diagnosis not present

## 2023-09-03 DIAGNOSIS — R2689 Other abnormalities of gait and mobility: Secondary | ICD-10-CM | POA: Diagnosis not present

## 2023-09-03 DIAGNOSIS — M25562 Pain in left knee: Secondary | ICD-10-CM | POA: Diagnosis not present

## 2023-09-10 DIAGNOSIS — I10 Essential (primary) hypertension: Secondary | ICD-10-CM | POA: Diagnosis not present

## 2023-09-10 DIAGNOSIS — Z79899 Other long term (current) drug therapy: Secondary | ICD-10-CM | POA: Diagnosis not present

## 2023-09-10 DIAGNOSIS — E785 Hyperlipidemia, unspecified: Secondary | ICD-10-CM | POA: Diagnosis not present

## 2023-09-11 DIAGNOSIS — M25562 Pain in left knee: Secondary | ICD-10-CM | POA: Diagnosis not present

## 2023-09-11 DIAGNOSIS — R2689 Other abnormalities of gait and mobility: Secondary | ICD-10-CM | POA: Diagnosis not present

## 2023-09-14 ENCOUNTER — Encounter: Payer: Self-pay | Admitting: Orthopaedic Surgery

## 2023-09-14 DIAGNOSIS — M25562 Pain in left knee: Secondary | ICD-10-CM | POA: Diagnosis not present

## 2023-09-14 DIAGNOSIS — R2689 Other abnormalities of gait and mobility: Secondary | ICD-10-CM | POA: Diagnosis not present

## 2023-09-16 ENCOUNTER — Encounter: Payer: PPO | Admitting: Sports Medicine

## 2023-09-21 DIAGNOSIS — M25562 Pain in left knee: Secondary | ICD-10-CM | POA: Diagnosis not present

## 2023-09-21 DIAGNOSIS — R2689 Other abnormalities of gait and mobility: Secondary | ICD-10-CM | POA: Diagnosis not present

## 2023-09-25 DIAGNOSIS — M25562 Pain in left knee: Secondary | ICD-10-CM | POA: Diagnosis not present

## 2023-09-25 DIAGNOSIS — R2689 Other abnormalities of gait and mobility: Secondary | ICD-10-CM | POA: Diagnosis not present

## 2023-09-29 DIAGNOSIS — M25562 Pain in left knee: Secondary | ICD-10-CM | POA: Diagnosis not present

## 2023-09-29 DIAGNOSIS — R2689 Other abnormalities of gait and mobility: Secondary | ICD-10-CM | POA: Diagnosis not present

## 2023-10-02 DIAGNOSIS — N390 Urinary tract infection, site not specified: Secondary | ICD-10-CM | POA: Diagnosis not present

## 2023-10-06 DIAGNOSIS — M25562 Pain in left knee: Secondary | ICD-10-CM | POA: Diagnosis not present

## 2023-10-06 DIAGNOSIS — N3020 Other chronic cystitis without hematuria: Secondary | ICD-10-CM | POA: Diagnosis not present

## 2023-10-06 DIAGNOSIS — R2689 Other abnormalities of gait and mobility: Secondary | ICD-10-CM | POA: Diagnosis not present

## 2023-10-09 DIAGNOSIS — M25562 Pain in left knee: Secondary | ICD-10-CM | POA: Diagnosis not present

## 2023-10-09 DIAGNOSIS — R2689 Other abnormalities of gait and mobility: Secondary | ICD-10-CM | POA: Diagnosis not present

## 2023-10-15 DIAGNOSIS — Z1231 Encounter for screening mammogram for malignant neoplasm of breast: Secondary | ICD-10-CM | POA: Diagnosis not present

## 2023-11-05 ENCOUNTER — Ambulatory Visit: Payer: PPO | Admitting: Physical Medicine and Rehabilitation

## 2023-11-05 ENCOUNTER — Encounter: Payer: Self-pay | Admitting: Physical Medicine and Rehabilitation

## 2023-11-05 DIAGNOSIS — G8929 Other chronic pain: Secondary | ICD-10-CM | POA: Diagnosis not present

## 2023-11-05 DIAGNOSIS — M47816 Spondylosis without myelopathy or radiculopathy, lumbar region: Secondary | ICD-10-CM

## 2023-11-05 DIAGNOSIS — M545 Low back pain, unspecified: Secondary | ICD-10-CM

## 2023-11-05 DIAGNOSIS — M25551 Pain in right hip: Secondary | ICD-10-CM | POA: Diagnosis not present

## 2023-11-05 NOTE — Progress Notes (Signed)
Sharon [name] - 73 y.o. female MRN [id number]  Date of birth: 1951/06/27  Office Visit Note: Visit Date: 11/05/2023 PCP: Melida Quitter, MD Referred by: Melida Quitter, MD  Subjective: Chief Complaint  Patient presents with   Right Hip - Pain   Lower Back - Pain   HPI: Sharon [name] is a 73 y.o. female who comes in today for evaluation of chronic, worsening and severe bilateral lower back pain, radiating to right buttock and hip/groin. Pain ongoing for several years, her pain worsened over the last month. Her pain worsens when moving from sitting to standing position. Also reports pain with standing and household tasks. Her pain tends to be worse in the morning. She describes her pain as sore and aching sensation, currently rates as 8 out of 10. Some relief of pain with home exercise regimen, rest and use of medications. Lumbar MRI imaging from 2023 shows progressive right foraminal stenosis at L5-S1.There is probable right L5 nerve root encroachment. There is also moderate facet arthropathy noted at L4-L5. No high grade spinal canal stenosis noted. Right hip x-rays from June of 2024 exhibits mild degenerative changes to right hip, there is osteophytosis of the superior acetabulum. She has undergone multiple injections in our office over the years, including right intra-articular hip injection and lumbar epidural steroid injections. Most recent was right intra-articular hip injection on 07/16/2023, she reports relief of pain for several days with this injection. She also underwent right greater trochanter injection with Dr. Magnus Ivan on 07/29/2023, minimal relief of pain with this procedure. Her biggest pain generator today seems to be bilateral lower back, especially with standing and moving from sitting to standing. Patient currently ambulates with cane. She denies focal weakness, numbness and tingling. No recent trauma or falls.         Review of Systems  Musculoskeletal:  Positive for  back pain and joint pain.  Neurological:  Negative for tingling, sensory change, focal weakness and weakness.  All other systems reviewed and are negative.  Otherwise per HPI.  Assessment & Plan: Visit Diagnoses:    ICD-10-CM   1. Chronic bilateral low back pain without sciatica  M54.50 Ambulatory referral to Physical Medicine Rehab   G89.29     2. Facet arthropathy, lumbar  M47.816 Ambulatory referral to Physical Medicine Rehab    3. Pain in right hip  M25.551 Ambulatory referral to Physical Medicine Rehab       Plan: Findings:  Chronic, worsening and severe bilateral lower back pain, radiating to right buttock and hip/groin. Bilateral lower back pain is most severe issue today. Patient continues to have severe pain despite good conservative therapies such as home exercise regimen, rest and use of medications. Patients clinical presentation and exam are complex, her symptoms today seem more facet mediated. There is moderate facet arthropathy noted bilaterally at the level of L4-L5. I also feel this could be more of an intrinsic hip issue such as labrum tear. She does have pain to right groin with weight bearing and ambulation. Next step is to perform diagnostic and hopefully therapeutic bilateral L4-L5 facet joint injections under fluoroscopic guidance. Should her pain persist would consider obtaining MRI imaging of right hip. We discussed medication management and recommended she try lidocaine patches and voltaren gel. Patient has no questions at this time. No red flag symptoms noted upon exam today.     Meds & Orders: No orders of the defined types were placed in this encounter.   Orders Placed  This Encounter  Procedures   Ambulatory referral to Physical Medicine Rehab    Follow-up: Return for Bilateral L4-L5 facet joint injections.   Procedures: No procedures performed      Clinical History: CLINICAL DATA:  Low back pain with right hip and leg pain for 1 month. Right foot  numbness. No known injury or prior relevant surgery.   EXAM: MRI LUMBAR SPINE WITHOUT CONTRAST   TECHNIQUE: Multiplanar, multisequence MR imaging of the lumbar spine was performed. No intravenous contrast was administered.   COMPARISON:  Lumbar MRI 01/26/2021 and 11/04/2017.   FINDINGS: Segmentation: Conventional anatomy assumed, with the last open disc space designated L5-S1.Concordant with previous imaging.   Alignment: Convex right scoliosis centered at L3, similar to previous study. Normal lateral alignment.   Vertebrae: No worrisome osseous lesion, acute fracture or pars defect. Previously demonstrated endplate degenerative changes at L1-2 have mildly improved. Mild sacroiliac degenerative changes bilaterally.   Conus medullaris: Extends to the L1-2 level and appears normal.   Paraspinal and other soft tissues: No significant paraspinal findings. Simple appearing cyst in the upper pole of the right kidney for which no follow-up imaging is recommended.   Disc levels:   No significant disc space findings at T11-12 or T12-L1.   L1-2: Chronic spondylosis with loss of disc height, annular disc bulging and endplate osteophytes. Endplate edema noted previously has improved. Mild left foraminal narrowing without nerve root encroachment.   L2-3: Preserved disc height. Mild disc bulging and facet hypertrophy. No spinal stenosis or nerve root encroachment.   L3-4: Stable mild loss of disc height with annular disc bulging eccentric to the left. Mild facet and ligamentous hypertrophy. Stable mild narrowing of the left lateral recess and left foramen without nerve root encroachment.   L4-5: Mild loss of disc height with annular disc bulging. Moderate facet and ligamentous hypertrophy. Stable mild lateral recess narrowing bilaterally. The foramina appear sufficiently patent.   L5-S1: Chronic loss of disc height with annular disc bulging and endplate osteophytes asymmetric to  the right. Mild facet and ligamentous hypertrophy. There is right foraminal narrowing which appears progressive with probable right L5 nerve root encroachment. Stable mild narrowing of the right lateral recess and left foramen.   IMPRESSION: 1. Compared with previous MRI from 2018, there is progressive right foraminal narrowing at L5-S1 due to disc bulging, endplate osteophytes and facet hypertrophy. There is probable right L5 nerve root encroachment. 2. No other significant changes, spinal stenosis or nerve root encroachment. Stable mild lateral recess narrowing bilaterally at L4-5 and mild narrowing of the left lateral recess and left foramen at L3-4. 3. Chronic spondylosis at L1-2 with improved endplate edema.     Electronically Signed   By: Carey Bullocks M.D.   On: 07/26/2022 13:59   She reports that she has never smoked. She has never used smokeless tobacco. No results for input(s): "HGBA1C", "LABURIC" in the last 8760 hours.  Objective:  VS:  HT:    WT:   BMI:     BP:   HR: bpm  TEMP: ( )  RESP:  Physical Exam Vitals and nursing note reviewed.  HENT:     Head: Normocephalic and atraumatic.     Right Ear: External ear normal.     Left Ear: External ear normal.     Nose: Nose normal.     Mouth/Throat:     Mouth: Mucous membranes are moist.  Eyes:     Extraocular Movements: Extraocular movements intact.  Cardiovascular:  Rate and Rhythm: Normal rate.     Pulses: Normal pulses.  Pulmonary:     Effort: Pulmonary effort is normal.  Abdominal:     General: Abdomen is flat. There is no distension.  Musculoskeletal:        General: Tenderness present.     Cervical back: Normal range of motion.     Comments: Patient is slow to rise from seated position to standing. Pain noted with facet loading and lumbar extension. 5/5 strength noted with bilateral hip flexion, knee flexion/extension, ankle dorsiflexion/plantarflexion and EHL. No clonus noted bilaterally. No pain  upon palpation of greater trochanters. Full passive range of motion of bilateral hips with flexion, external rotation, and internal rotation. Negative Stinchfield on the right. Sensation intact bilaterally. Negative slump test bilaterally. Ambulates with cane, gait slow and unsteady.     Skin:    General: Skin is warm and dry.     Capillary Refill: Capillary refill takes less than 2 seconds.  Neurological:     Mental Status: She is alert and oriented to person, place, and time.     Gait: Gait abnormal.  Psychiatric:        Mood and Affect: Mood normal.        Behavior: Behavior normal.     Ortho Exam  Imaging: No results found.  Past Medical/Family/Surgical/Social History: Medications & Allergies reviewed per EMR, new medications updated. Patient Active Problem List   Diagnosis Date Noted   Status post revision of total replacement of left knee 06/09/2023   Liver cyst 01/28/2022   Pain in left shoulder 01/10/2021   Neck pain, acute 04/11/2020   Asymptomatic varicose veins of bilateral lower extremities 02/09/2020   Functional dyspepsia 11/22/2019   Aortic valve disorder 11/17/2019   Family history of cancer 11/17/2019   Hyperlipidemia 11/17/2019   Lactose intolerance 11/17/2019   Migraine, unspecified, not intractable, without status migrainosus 11/17/2019   Pain in right ankle and joints of right foot 07/26/2019   Status post total replacement of left hip 07/20/2018   Unilateral primary osteoarthritis, left hip 05/25/2018   Genetic testing 11/14/2016   Family history of ovarian cancer    Family history of colon cancer    Family history of uterine cancer    Family history of stomach cancer    Osteoarthritis of left knee 04/03/2015   History of left knee replacement 04/03/2015   Gait abnormality 12/22/2012   Swelling of ankle 12/22/2012   Hypertension    Headache    Arthritis    Vitamin D deficiency    Past Medical History:  Diagnosis Date   Cataract    Family  history of adverse reaction to anesthesia    "mom did; don't know what happened" (07/20/2018)   Family history of colon cancer    Family history of ovarian cancer    Family history of stomach cancer    Family history of uterine cancer    Fibroid    GERD (gastroesophageal reflux disease)    Heart murmur    "tiny, tiny one" (07/20/2018)   High cholesterol    Hypertension    Migraine    "a few/year; last one was 07/19/2018" (07/20/2018)   Osteoarthritis    "knees, hips, hands, probably in my back" (07/20/2018)   Ringworm    Vitamin D deficiency    Family History  Problem Relation Age of Onset   Ovarian cancer Mother 42   Heart disease Maternal Grandfather    Hypertension Paternal Grandmother  Ovarian cancer Cousin        maternal first cousin dx in her 46s   Heart disease Father    Atrial fibrillation Father    Heart disease Brother    Heart disease Brother    Colon cancer Maternal Aunt        dx in her 79s-60   Diabetes Paternal Aunt    Ovarian cancer Maternal Grandmother        dx in her mid 105s   Cancer Maternal Grandmother        OVARIAN AND UTERINE   Uterine cancer Maternal Grandmother        dx in her early 54s   Stomach cancer Maternal Uncle        dx in his 29s-80s   Stroke Maternal Uncle    Leukemia Paternal Uncle    Past Surgical History:  Procedure Laterality Date   APPENDECTOMY  1969   BUNIONECTOMY Bilateral    EYE SURGERY Bilateral    JOINT REPLACEMENT     KNEE ARTHROSCOPY Left 11/2013   Precancerous lesion excised     "arm"   TONSILLECTOMY     TOTAL ABDOMINAL HYSTERECTOMY  1990   TAH BSO   TOTAL HIP ARTHROPLASTY Left 07/20/2018   TOTAL HIP ARTHROPLASTY Left 07/20/2018   Procedure: LEFT TOTAL HIP ARTHROPLASTY ANTERIOR APPROACH;  Surgeon: Kathryne Hitch, MD;  Location: [facility] OR;  Service: Orthopedics;  Laterality: Left;   TOTAL KNEE ARTHROPLASTY Right 2012   TOTAL KNEE ARTHROPLASTY Left 04/03/2015   Procedure: TOTAL KNEE ARTHROPLASTY;  Surgeon:  Valeria Batman, MD;  Location: [facility] OR;  Service: Orthopedics;  Laterality: Left;   TOTAL KNEE REVISION Left 06/09/2023   Procedure: Revision of Poly Lateral for Left Knee;  Surgeon: Kathryne Hitch, MD;  Location: [facility] ORS;  Service: Orthopedics;  Laterality: Left;   Social History   Occupational History   Not on file  Tobacco Use   Smoking status: Never   Smokeless tobacco: Never  Vaping Use   Vaping status: Never Used  Substance and Sexual Activity   Alcohol use: Yes    Alcohol/week: 1.0 - 2.0 standard drink of alcohol    Types: 1 - 2 Standard drinks or equivalent per week    Comment: rarely   Drug use: No   Sexual activity: Not Currently    Partners: Male    Birth control/protection: Surgical, Post-menopausal

## 2023-11-05 NOTE — Progress Notes (Signed)
When she stands her whole lower back hurts. She stated it takes her a while to get going.  She was taking tylenol and  lido patch and it kinda takes the edge off.  Pain for about 1 week now, but she feels as if it is getting worse.

## 2023-11-16 DIAGNOSIS — N3020 Other chronic cystitis without hematuria: Secondary | ICD-10-CM | POA: Diagnosis not present

## 2023-11-24 ENCOUNTER — Ambulatory Visit: Payer: PPO | Admitting: Physical Medicine and Rehabilitation

## 2023-11-24 ENCOUNTER — Other Ambulatory Visit: Payer: Self-pay

## 2023-11-24 DIAGNOSIS — M47816 Spondylosis without myelopathy or radiculopathy, lumbar region: Secondary | ICD-10-CM | POA: Diagnosis not present

## 2023-11-24 MED ORDER — METHYLPREDNISOLONE ACETATE 40 MG/ML IJ SUSP
40.0000 mg | Freq: Once | INTRAMUSCULAR | Status: AC
Start: 2023-11-24 — End: 2023-11-24
  Administered 2023-11-24: 40 mg

## 2023-11-24 NOTE — Progress Notes (Signed)
 Functional Pain Scale - descriptive words and definitions  Mild (2)   Noticeable when not distracted/no impact on ADL's/sleep only slightly affected and able to   use both passive and active distraction for comfort. Mild range order  Average Pain 2 R > L   +Driver, -BT, -Dye Allergies.

## 2023-11-25 ENCOUNTER — Ambulatory Visit: Payer: PPO | Admitting: Orthopaedic Surgery

## 2023-11-25 ENCOUNTER — Encounter: Payer: Self-pay | Admitting: Orthopaedic Surgery

## 2023-11-25 ENCOUNTER — Other Ambulatory Visit (INDEPENDENT_AMBULATORY_CARE_PROVIDER_SITE_OTHER): Payer: Self-pay

## 2023-11-25 DIAGNOSIS — Z96652 Presence of left artificial knee joint: Secondary | ICD-10-CM | POA: Diagnosis not present

## 2023-11-25 NOTE — Progress Notes (Signed)
 The patient is an active 73 year old female who is now 6 months out from a left knee poly liner exchange with upsizing her poly liner.  She had a previous total knee arthroplasty done by my partner Dr. Anderson and she experienced some instability of that knee.  Fortunately the components of the knee were intact and we were able to upsize the poly.  She says she has some swelling with activities but overall she is doing well.  She has a previous right total knee arthroplasty that is still has no issues.  Both knees were assessed today and both knees have normal motion and are stable on exam.  There is no significant swelling.  Standing AP and lateral of the left knee shows a well-seated total knee arthroplasty on both views.  The right knee on the AP view also shows a well-seated total knee arthroplasty.  At this point she will continue activities as comfort allows but she can stop therapy.  Follow-up can be as needed.  If she does have any worsening symptoms at all she knows to reach out to us  and let us  know.

## 2023-12-02 ENCOUNTER — Ambulatory Visit: Payer: PPO | Admitting: Orthopaedic Surgery

## 2023-12-03 NOTE — Progress Notes (Signed)
 Sharon [name] - 73 y.o. female MRN [id number]  Date of birth: Jul 07, 1951  Office Visit Note: Visit Date: 11/24/2023 PCP: Stephane Leita DEL, MD Referred by: Stephane Leita DEL, MD  Subjective: Chief Complaint  Patient presents with   Lower Back - Pain   HPI:  Sharon [name] is a 73 y.o. female who comes in today at the request of Duwaine Pouch, FNP for planned Bilateral  L4-5 Lumbar facet/medial branch block with fluoroscopic guidance.  The patient has failed conservative care including home exercise, medications, time and activity modification.  This injection will be diagnostic and hopefully therapeutic.  Please see requesting physician notes for further details and justification.  Exam has shown concordant pain with facet joint loading.   ROS Otherwise per HPI.  Assessment & Plan: Visit Diagnoses:    ICD-10-CM   1. Spondylosis without myelopathy or radiculopathy, lumbar region  M47.816 XR C-ARM NO REPORT    Facet Injection    methylPREDNISolone  acetate (DEPO-MEDROL ) injection 40 mg    2. Facet arthropathy, lumbar  M47.816 XR C-ARM NO REPORT    Facet Injection    methylPREDNISolone  acetate (DEPO-MEDROL ) injection 40 mg      Plan: No additional findings.   Meds & Orders:  Meds ordered this encounter  Medications   methylPREDNISolone  acetate (DEPO-MEDROL ) injection 40 mg    Orders Placed This Encounter  Procedures   Facet Injection   XR C-ARM NO REPORT    Follow-up: Return if symptoms worsen or fail to improve.   Procedures: No procedures performed  Lumbar Facet Joint Intra-Articular Injection(s) with Fluoroscopic Guidance  Patient: Sharon [name]      Date of Birth: 1951/03/07 MRN: [id number] PCP: Stephane Leita DEL, MD      Visit Date: 11/24/2023   Universal Protocol:    Date/Time: 11/24/2023  Consent Given By: the patient  Position: PRONE   Additional Comments: Vital signs were monitored before and after the procedure. Patient was prepped and draped in the  usual sterile fashion. The correct patient, procedure, and site was verified.   Injection Procedure Details:  Procedure Site One Meds Administered:  Meds ordered this encounter  Medications   methylPREDNISolone  acetate (DEPO-MEDROL ) injection 40 mg     Laterality: Bilateral  Location/Site:  L4-L5  Needle size: 22 guage  Needle type: Spinal  Needle Placement: Articular  Findings:  -Comments: Excellent flow of contrast producing a partial arthrogram.  Procedure Details: The fluoroscope beam is vertically oriented in AP, and the inferior recess is visualized beneath the lower pole of the inferior apophyseal process, which represents the target point for needle insertion. When direct visualization is difficult the target point is located at the medial projection of the vertebral pedicle. The region overlying each aforementioned target is locally anesthetized with a 1 to 2 ml. volume of 1% Lidocaine  without Epinephrine .   The spinal needle was inserted into each of the above mentioned facet joints using biplanar fluoroscopic guidance. A 0.25 to 0.5 ml. volume of Isovue-250 was injected and a partial facet joint arthrogram was obtained. A single spot film was obtained of the resulting arthrogram.    One to 1.25 ml of the steroid/anesthetic solution was then injected into each of the facet joints noted above.   Additional Comments:  No complications occurred Dressing: 2 x 2 sterile gauze and Band-Aid    Post-procedure details: Patient was observed during the procedure. Post-procedure instructions were reviewed.  Patient left the clinic in stable condition.    Clinical  History: CLINICAL DATA:  Low back pain with right hip and leg pain for 1 month. Right foot numbness. No known injury or prior relevant surgery.   EXAM: MRI LUMBAR SPINE WITHOUT CONTRAST   TECHNIQUE: Multiplanar, multisequence MR imaging of the lumbar spine was performed. No intravenous contrast was  administered.   COMPARISON:  Lumbar MRI 01/26/2021 and 11/04/2017.   FINDINGS: Segmentation: Conventional anatomy assumed, with the last open disc space designated L5-S1.Concordant with previous imaging.   Alignment: Convex right scoliosis centered at L3, similar to previous study. Normal lateral alignment.   Vertebrae: No worrisome osseous lesion, acute fracture or pars defect. Previously demonstrated endplate degenerative changes at L1-2 have mildly improved. Mild sacroiliac degenerative changes bilaterally.   Conus medullaris: Extends to the L1-2 level and appears normal.   Paraspinal and other soft tissues: No significant paraspinal findings. Simple appearing cyst in the upper pole of the right kidney for which no follow-up imaging is recommended.   Disc levels:   No significant disc space findings at T11-12 or T12-L1.   L1-2: Chronic spondylosis with loss of disc height, annular disc bulging and endplate osteophytes. Endplate edema noted previously has improved. Mild left foraminal narrowing without nerve root encroachment.   L2-3: Preserved disc height. Mild disc bulging and facet hypertrophy. No spinal stenosis or nerve root encroachment.   L3-4: Stable mild loss of disc height with annular disc bulging eccentric to the left. Mild facet and ligamentous hypertrophy. Stable mild narrowing of the left lateral recess and left foramen without nerve root encroachment.   L4-5: Mild loss of disc height with annular disc bulging. Moderate facet and ligamentous hypertrophy. Stable mild lateral recess narrowing bilaterally. The foramina appear sufficiently patent.   L5-S1: Chronic loss of disc height with annular disc bulging and endplate osteophytes asymmetric to the right. Mild facet and ligamentous hypertrophy. There is right foraminal narrowing which appears progressive with probable right L5 nerve root encroachment. Stable mild narrowing of the right lateral recess and  left foramen.   IMPRESSION: 1. Compared with previous MRI from 2018, there is progressive right foraminal narrowing at L5-S1 due to disc bulging, endplate osteophytes and facet hypertrophy. There is probable right L5 nerve root encroachment. 2. No other significant changes, spinal stenosis or nerve root encroachment. Stable mild lateral recess narrowing bilaterally at L4-5 and mild narrowing of the left lateral recess and left foramen at L3-4. 3. Chronic spondylosis at L1-2 with improved endplate edema.     Electronically Signed   By: Elsie Perone M.D.   On: 07/26/2022 13:59     Objective:  VS:  HT:    WT:   BMI:     BP:   HR: bpm  TEMP: ( )  RESP:  Physical Exam Vitals and nursing note reviewed.  Constitutional:      General: She is not in acute distress.    Appearance: Normal appearance. She is not ill-appearing.  HENT:     Head: Normocephalic and atraumatic.     Right Ear: External ear normal.     Left Ear: External ear normal.  Eyes:     Extraocular Movements: Extraocular movements intact.  Cardiovascular:     Rate and Rhythm: Normal rate.     Pulses: Normal pulses.  Pulmonary:     Effort: Pulmonary effort is normal. No respiratory distress.  Abdominal:     General: There is no distension.     Palpations: Abdomen is soft.  Musculoskeletal:  General: Tenderness present.     Cervical back: Neck supple.     Right lower leg: No edema.     Left lower leg: No edema.     Comments: Patient has good distal strength with no pain over the greater trochanters.  No clonus or focal weakness.  Skin:    Findings: No erythema, lesion or rash.  Neurological:     General: No focal deficit present.     Mental Status: She is alert and oriented to person, place, and time.     Sensory: No sensory deficit.     Motor: No weakness or abnormal muscle tone.     Coordination: Coordination normal.  Psychiatric:        Mood and Affect: Mood normal.        Behavior:  Behavior normal.      Imaging: No results found.

## 2023-12-03 NOTE — Procedures (Signed)
 Lumbar Facet Joint Intra-Articular Injection(s) with Fluoroscopic Guidance  Patient: Sharon [name]      Date of Birth: 07-10-1951 MRN: [id number] PCP: Stephane Leita DEL, MD      Visit Date: 11/24/2023   Universal Protocol:    Date/Time: 11/24/2023  Consent Given By: the patient  Position: PRONE   Additional Comments: Vital signs were monitored before and after the procedure. Patient was prepped and draped in the usual sterile fashion. The correct patient, procedure, and site was verified.   Injection Procedure Details:  Procedure Site One Meds Administered:  Meds ordered this encounter  Medications   methylPREDNISolone  acetate (DEPO-MEDROL ) injection 40 mg     Laterality: Bilateral  Location/Site:  L4-L5  Needle size: 22 guage  Needle type: Spinal  Needle Placement: Articular  Findings:  -Comments: Excellent flow of contrast producing a partial arthrogram.  Procedure Details: The fluoroscope beam is vertically oriented in AP, and the inferior recess is visualized beneath the lower pole of the inferior apophyseal process, which represents the target point for needle insertion. When direct visualization is difficult the target point is located at the medial projection of the vertebral pedicle. The region overlying each aforementioned target is locally anesthetized with a 1 to 2 ml. volume of 1% Lidocaine  without Epinephrine .   The spinal needle was inserted into each of the above mentioned facet joints using biplanar fluoroscopic guidance. A 0.25 to 0.5 ml. volume of Isovue-250 was injected and a partial facet joint arthrogram was obtained. A single spot film was obtained of the resulting arthrogram.    One to 1.25 ml of the steroid/anesthetic solution was then injected into each of the facet joints noted above.   Additional Comments:  No complications occurred Dressing: 2 x 2 sterile gauze and Band-Aid    Post-procedure details: Patient was observed during the  procedure. Post-procedure instructions were reviewed.  Patient left the clinic in stable condition.

## 2023-12-11 DIAGNOSIS — H9202 Otalgia, left ear: Secondary | ICD-10-CM | POA: Diagnosis not present

## 2023-12-11 DIAGNOSIS — J069 Acute upper respiratory infection, unspecified: Secondary | ICD-10-CM | POA: Diagnosis not present

## 2023-12-11 DIAGNOSIS — R5383 Other fatigue: Secondary | ICD-10-CM | POA: Diagnosis not present

## 2023-12-11 DIAGNOSIS — R509 Fever, unspecified: Secondary | ICD-10-CM | POA: Diagnosis not present

## 2023-12-11 DIAGNOSIS — R051 Acute cough: Secondary | ICD-10-CM | POA: Diagnosis not present

## 2023-12-11 DIAGNOSIS — R52 Pain, unspecified: Secondary | ICD-10-CM | POA: Diagnosis not present

## 2023-12-11 DIAGNOSIS — Z1152 Encounter for screening for COVID-19: Secondary | ICD-10-CM | POA: Diagnosis not present

## 2023-12-31 ENCOUNTER — Encounter: Payer: Self-pay | Admitting: Podiatry

## 2023-12-31 ENCOUNTER — Ambulatory Visit: Admitting: Podiatry

## 2023-12-31 DIAGNOSIS — S90212A Contusion of left great toe with damage to nail, initial encounter: Secondary | ICD-10-CM

## 2023-12-31 DIAGNOSIS — M7742 Metatarsalgia, left foot: Secondary | ICD-10-CM | POA: Diagnosis not present

## 2023-12-31 DIAGNOSIS — B351 Tinea unguium: Secondary | ICD-10-CM | POA: Diagnosis not present

## 2023-12-31 DIAGNOSIS — M7741 Metatarsalgia, right foot: Secondary | ICD-10-CM

## 2023-12-31 NOTE — Patient Instructions (Signed)

## 2023-12-31 NOTE — Progress Notes (Signed)
  Subjective:  Patient ID: Sharon [name], female    DOB: 1951/08/07,  MRN: [id number]  Chief Complaint  Patient presents with   Nail Problem    Patient states she is ready to get this toe nail fungus situated she has fungus on bilateral feet.     73 y.o. female presents with the above complaint. History confirmed with patient.  She has a few issues to discuss today including recurrent chronic nail fungus that has been present I last saw her in 2022 for the same issue.  Feels like it is progressively worsened since then.  She also notes a darkened spot on the left great toenail that has gotten bigger recently and is bigger than it used to be when I saw her last.  Additionally she has chronic arch pain previous bunion surgery that feels like the arch is collapsing and increased pressure on the ball of the foot and arch, she has previously had custom molded orthoses and would like to have a new pair made  Objective:  Physical Exam: warm, good capillary refill, no trophic changes or ulcerative lesions, normal DP and PT pulses, normal sensory exam, and onychomycosis with thickening dystrophy and subungual debris of multiple toenails, on the left hallux there is a darkened streak and dark area of the proximal nail fold no Hutchinson signs or spreading.  There is tenderness across the mid arch and metatarsals.      Assessment:   1. Metatarsalgia of both feet   2. Onychomycosis   3. Subungual hematoma of great toe of left foot, initial encounter      Plan:  Patient was evaluated and treated and all questions answered.  Regarding the onychomycosis we discussed treatment options including oral and laser treatment.  I do not think topical treatment will be effective.  Discussed the pros and cons of each treatment option, she was scheduled for laser treatment.  She did have a pigmented area in the proximal nail on the left hallux, we previously biopsied the same nail a few years ago and this  came back as a melanotic macule she thinks it has become larger.  I recommended avulsion and expection of the nailbed.  Following consent and digital block with lidocaine and Marcaine the hallux was pared with Betadine and a tourniquet was secured around the base of the toe after being exsanguinated.  The left hallux nail plate was avulsed and the nailbed inspected does appear to be consistent with a subungual hematoma there is no melena or pigmentation of the nail matrix or nailbed.  Did not recommend further biopsy.  Will continue to monitor as the nail regrows.  We also discussed her foot pain she has had relief from custom molded foot orthoses before, she did not like a very rigid or thick orthotic that took up a lot of room in her shoes.  Discussed the option of an accommodative dress orthotic.  She will follow-up with orthotist for fitting for this.   Return in about 4 months (around 05/01/2024) for follow up after nail fungus treatment.

## 2024-01-01 ENCOUNTER — Encounter: Payer: Self-pay | Admitting: Podiatry

## 2024-01-18 ENCOUNTER — Ambulatory Visit (INDEPENDENT_AMBULATORY_CARE_PROVIDER_SITE_OTHER): Admitting: Podiatrist

## 2024-01-18 DIAGNOSIS — B351 Tinea unguium: Secondary | ICD-10-CM

## 2024-01-18 NOTE — Progress Notes (Signed)
 Patient presents today for the laser treatment # 1. Diagnosed with mycotic nail infection by Dr. Sherron Ales most affected are Bilateral 1-5   All other systems are negative.  Patients nails were filed thin using a sterile burr.    Laser therapy via [facility] Laser therapy system was admininstered to affected toenails both feet .  The Patient tolerated the treatment well. All safety precautions were in place.   Single laser pass was done on non-affected nails.   Follow up in 4 weeks for laser # 2  discussed she should see improvement by the 3rd visit.  She is amenable to oral Lamisil if no improvement is noted at that visit-  she will see Dr. Lilian Kapur to discuss if no improvement noted with laser therapy.    See pre treatment photo below for progress

## 2024-01-18 NOTE — Patient Instructions (Signed)

## 2024-01-25 ENCOUNTER — Other Ambulatory Visit

## 2024-02-08 ENCOUNTER — Ambulatory Visit (INDEPENDENT_AMBULATORY_CARE_PROVIDER_SITE_OTHER)

## 2024-02-08 DIAGNOSIS — M2141 Flat foot [pes planus] (acquired), right foot: Secondary | ICD-10-CM

## 2024-02-08 DIAGNOSIS — M2142 Flat foot [pes planus] (acquired), left foot: Secondary | ICD-10-CM | POA: Diagnosis not present

## 2024-02-08 DIAGNOSIS — M7741 Metatarsalgia, right foot: Secondary | ICD-10-CM | POA: Diagnosis not present

## 2024-02-08 DIAGNOSIS — M7742 Metatarsalgia, left foot: Secondary | ICD-10-CM

## 2024-02-08 NOTE — Progress Notes (Signed)
 Patient was seen today for Inserts  Patient has pair she has been wearing for several years and seem very soft almost like a thick padding  We discussed options for Custom molded Fo's and off the Shelf power steps  I tried a pair of Power Steps on Patient SZ 10.5 Women's and patient seemed to tolerate them very well  Total contact provided reducing plantar pressure by helping to balance body weight more evenly across BIL feet  Foot and ankle ROM With in normal limit Over pronation reduced as well   I will Submit for Prior auth of Power Steps if rejected patient is aware of out of pocket cost  Britton Cane Cped, CFo, CFm

## 2024-02-15 DIAGNOSIS — L821 Other seborrheic keratosis: Secondary | ICD-10-CM | POA: Diagnosis not present

## 2024-02-15 DIAGNOSIS — L820 Inflamed seborrheic keratosis: Secondary | ICD-10-CM | POA: Diagnosis not present

## 2024-02-15 DIAGNOSIS — D1801 Hemangioma of skin and subcutaneous tissue: Secondary | ICD-10-CM | POA: Diagnosis not present

## 2024-02-15 DIAGNOSIS — D2371 Other benign neoplasm of skin of right lower limb, including hip: Secondary | ICD-10-CM | POA: Diagnosis not present

## 2024-02-26 ENCOUNTER — Ambulatory Visit (INDEPENDENT_AMBULATORY_CARE_PROVIDER_SITE_OTHER): Admitting: *Deleted

## 2024-02-26 DIAGNOSIS — B351 Tinea unguium: Secondary | ICD-10-CM

## 2024-02-26 NOTE — Progress Notes (Signed)
 Patient presents today for the 2nd laser treatment. Diagnosed with mycotic nail infection by Dr. Michalene Agee.   Toenail most affected all ten nails. She states that she can see a big difference in the appearance of her nails already.   All other systems are negative.  Nails were filed thin. She has thick, cracked, callused skin around a few of her nails and I filed those down as well. Laser therapy was administered to 1-5 toenails bilateral and patient tolerated the treatment well. All safety precautions were in place.    Follow up in 4 weeks for laser # 3.

## 2024-03-02 DIAGNOSIS — E559 Vitamin D deficiency, unspecified: Secondary | ICD-10-CM | POA: Diagnosis not present

## 2024-03-02 DIAGNOSIS — R7301 Impaired fasting glucose: Secondary | ICD-10-CM | POA: Diagnosis not present

## 2024-03-02 DIAGNOSIS — I10 Essential (primary) hypertension: Secondary | ICD-10-CM | POA: Diagnosis not present

## 2024-03-02 DIAGNOSIS — E785 Hyperlipidemia, unspecified: Secondary | ICD-10-CM | POA: Diagnosis not present

## 2024-03-09 DIAGNOSIS — Z0000 Encounter for general adult medical examination without abnormal findings: Secondary | ICD-10-CM | POA: Diagnosis not present

## 2024-03-09 DIAGNOSIS — K30 Functional dyspepsia: Secondary | ICD-10-CM | POA: Diagnosis not present

## 2024-03-09 DIAGNOSIS — E663 Overweight: Secondary | ICD-10-CM | POA: Diagnosis not present

## 2024-03-09 DIAGNOSIS — I351 Nonrheumatic aortic (valve) insufficiency: Secondary | ICD-10-CM | POA: Diagnosis not present

## 2024-03-09 DIAGNOSIS — Z1339 Encounter for screening examination for other mental health and behavioral disorders: Secondary | ICD-10-CM | POA: Diagnosis not present

## 2024-03-09 DIAGNOSIS — I10 Essential (primary) hypertension: Secondary | ICD-10-CM | POA: Diagnosis not present

## 2024-03-09 DIAGNOSIS — Z87440 Personal history of urinary (tract) infections: Secondary | ICD-10-CM | POA: Diagnosis not present

## 2024-03-09 DIAGNOSIS — E785 Hyperlipidemia, unspecified: Secondary | ICD-10-CM | POA: Diagnosis not present

## 2024-03-09 DIAGNOSIS — Z636 Dependent relative needing care at home: Secondary | ICD-10-CM | POA: Diagnosis not present

## 2024-03-09 DIAGNOSIS — Z1331 Encounter for screening for depression: Secondary | ICD-10-CM | POA: Diagnosis not present

## 2024-03-09 DIAGNOSIS — Z6826 Body mass index (BMI) 26.0-26.9, adult: Secondary | ICD-10-CM | POA: Diagnosis not present

## 2024-03-09 DIAGNOSIS — E559 Vitamin D deficiency, unspecified: Secondary | ICD-10-CM | POA: Diagnosis not present

## 2024-03-15 DIAGNOSIS — R509 Fever, unspecified: Secondary | ICD-10-CM | POA: Diagnosis not present

## 2024-03-15 DIAGNOSIS — J189 Pneumonia, unspecified organism: Secondary | ICD-10-CM | POA: Diagnosis not present

## 2024-03-15 DIAGNOSIS — R9389 Abnormal findings on diagnostic imaging of other specified body structures: Secondary | ICD-10-CM | POA: Diagnosis not present

## 2024-03-15 DIAGNOSIS — Z1152 Encounter for screening for COVID-19: Secondary | ICD-10-CM | POA: Diagnosis not present

## 2024-03-15 DIAGNOSIS — R0981 Nasal congestion: Secondary | ICD-10-CM | POA: Diagnosis not present

## 2024-03-15 DIAGNOSIS — R52 Pain, unspecified: Secondary | ICD-10-CM | POA: Diagnosis not present

## 2024-03-15 DIAGNOSIS — R051 Acute cough: Secondary | ICD-10-CM | POA: Diagnosis not present

## 2024-03-15 DIAGNOSIS — J029 Acute pharyngitis, unspecified: Secondary | ICD-10-CM | POA: Diagnosis not present

## 2024-03-15 DIAGNOSIS — R350 Frequency of micturition: Secondary | ICD-10-CM | POA: Diagnosis not present

## 2024-03-21 DIAGNOSIS — J189 Pneumonia, unspecified organism: Secondary | ICD-10-CM | POA: Diagnosis not present

## 2024-03-21 DIAGNOSIS — R9389 Abnormal findings on diagnostic imaging of other specified body structures: Secondary | ICD-10-CM | POA: Diagnosis not present

## 2024-03-21 DIAGNOSIS — R220 Localized swelling, mass and lump, head: Secondary | ICD-10-CM | POA: Diagnosis not present

## 2024-03-22 NOTE — Progress Notes (Signed)
 Prior auth recvd from [facility] valid 02/08/24 - 05/08/2024  Auth # [id number]

## 2024-04-01 ENCOUNTER — Ambulatory Visit (INDEPENDENT_AMBULATORY_CARE_PROVIDER_SITE_OTHER): Admitting: *Deleted

## 2024-04-01 DIAGNOSIS — B351 Tinea unguium: Secondary | ICD-10-CM

## 2024-04-01 NOTE — Progress Notes (Signed)
 Patient presents today for the 3rd laser treatment. Diagnosed with mycotic nail infection by Dr. Michalene Agee.   Toenail most affected all ten nails.  All other systems are negative.  Nails were filed thin. She has thick, cracked, callused skin around a few of her nails and I filed those down as well. Laser therapy was administered to 1-5 toenails bilateral and patient tolerated the treatment well. All safety precautions were in place.    Follow up in 6 weeks for laser # 4

## 2024-04-05 DIAGNOSIS — J189 Pneumonia, unspecified organism: Secondary | ICD-10-CM | POA: Diagnosis not present

## 2024-05-05 ENCOUNTER — Ambulatory Visit: Admitting: Podiatry

## 2024-05-10 ENCOUNTER — Ambulatory Visit: Admitting: Podiatry

## 2024-05-10 VITALS — Ht 66.0 in | Wt 170.0 lb

## 2024-05-10 DIAGNOSIS — B351 Tinea unguium: Secondary | ICD-10-CM | POA: Diagnosis not present

## 2024-05-10 DIAGNOSIS — Z23 Encounter for immunization: Secondary | ICD-10-CM | POA: Diagnosis not present

## 2024-05-11 NOTE — Progress Notes (Signed)
  Subjective:  Patient ID: Sharon [name], female    DOB: 07/22/1951,  MRN: [id number]  Chief Complaint  Patient presents with   Nail Problem    rm 3 nail fungus treatment f/u 4 mo. Patient is concerned about third right toe nail, thickened and overgrown.    73 y.o. female presents with the above complaint. History confirmed with patient.  She notes improvement  Objective:  Physical Exam: warm, good capillary refill, no trophic changes or ulcerative lesions, normal DP and PT pulses, normal sensory exam, and onychomycosis with thickening dystrophy and subungual debris of multiple toenails, there is improvement in the mycotic nails especially the bilateral hallux right second fourth left 2nd and 3rd toenails.  The severely dystrophic nails have not had much improvement.         Assessment:   1. Onychomycosis       Plan:  Patient was evaluated and treated and all questions answered.  Laser treatment is improving her condition.  I recommended continuing for another 6 months.  We will reevaluate at that point.  Discussed that the severely dystrophic nails may not have full resolution.  We discussed temporary or permanent removal if these nails are painful or causing infection which they currently are not.  Follow-up in 6 months to reevaluate.   No follow-ups on file.

## 2024-05-13 ENCOUNTER — Ambulatory Visit (INDEPENDENT_AMBULATORY_CARE_PROVIDER_SITE_OTHER)

## 2024-05-13 DIAGNOSIS — B351 Tinea unguium: Secondary | ICD-10-CM

## 2024-05-13 NOTE — Progress Notes (Signed)
 Patient presents today for the 4th laser treatment. Diagnosed with mycotic nail infection by Dr. Silva.   Toenail most affected 1-5 bilateral.  All other systems are negative.  Nails were filed thin. Laser therapy was administered to 1-5 toenails bilateral and patient tolerated the treatment well. All safety precautions were in place.   She has thick, cracked and callused skin around some of the nails. I did file those down during visit today. This was tolerated well.   Follow up in 6 weeks for laser # 5.

## 2024-06-10 ENCOUNTER — Other Ambulatory Visit

## 2024-06-14 ENCOUNTER — Ambulatory Visit (INDEPENDENT_AMBULATORY_CARE_PROVIDER_SITE_OTHER): Admitting: Allergy & Immunology

## 2024-06-14 ENCOUNTER — Other Ambulatory Visit: Payer: Self-pay

## 2024-06-14 ENCOUNTER — Encounter: Payer: Self-pay | Admitting: Allergy & Immunology

## 2024-06-14 VITALS — BP 130/82 | HR 75 | Temp 98.0°F | Ht 66.5 in | Wt 162.2 lb

## 2024-06-14 DIAGNOSIS — G8929 Other chronic pain: Secondary | ICD-10-CM

## 2024-06-14 DIAGNOSIS — R220 Localized swelling, mass and lump, head: Secondary | ICD-10-CM | POA: Diagnosis not present

## 2024-06-14 DIAGNOSIS — R519 Headache, unspecified: Secondary | ICD-10-CM | POA: Diagnosis not present

## 2024-06-14 DIAGNOSIS — R221 Localized swelling, mass and lump, neck: Secondary | ICD-10-CM | POA: Diagnosis not present

## 2024-06-14 NOTE — Progress Notes (Signed)
 NEW PATIENT  Date of Service/Encounter:  06/14/24  Consult requested by: Sharon Leita DEL, MD   Assessment:   Swelling of lip, tongue, and throat  Complicated past medical history including migraines, osteoarthritis, and GERD  Plan/Recommendations:   1. Swelling of lip, tongue, and throat - We are going to get some labs to look for weird causes of swelling, including labs to look for hereditary angioedema. - This is unlikely, but we should do our due diligence to make sure.  - We are going to contact you in 1-2 weeks with the results of the testing.  - I would recommend doing high dose antihistamines at the FIRST SIGN of lip swelling to see if this helps decrease the length of the episodes.  AM: Zyrtec (10mg ) + Pepcid (famotidine) 20mg    PM: Zyrtec (10mg ) + Pepcid (famotidine) 20mg   - Continue with the Claritin (loratadine) daily.  - We are going to do testing to the environmental allergens and the most common foods at the next visit. - Stop the Claritin for 3 days before the next appointment.   2. Return in about 1 week (around 06/21/2024) for ALLERGY TESTING (1-68). You can have the follow up appointment with Sharon [name] or a Nurse Practicioner (our Nurse Practitioners are excellent and always have Physician oversight!).    This note in its entirety was forwarded to the Provider who requested this consultation.  Subjective:   Sharon [name] is a 73 y.o. female presenting today for evaluation of  Chief Complaint  Patient presents with   Allergic Rhinitis     Referred for mouth swelling. She woke up one morning with a swollen bottom lip, she went to her dentist thinking it was oral related. She thought this could of been due to using burts bee's facial wipes but swelling is still ongoing and intermittent, at this time cause is unknown and swelling is occurring on top and bottom lip.    Sharon [name] has a history of the following: Patient Active Problem List    Diagnosis Date Noted   Status post revision of total replacement of left knee 06/09/2023   Liver cyst 01/28/2022   Pain in left shoulder 01/10/2021   Neck pain, acute 04/11/2020   Asymptomatic varicose veins of bilateral lower extremities 02/09/2020   Functional dyspepsia 11/22/2019   Aortic valve disorder 11/17/2019   Family history of cancer 11/17/2019   Hyperlipidemia 11/17/2019   Lactose intolerance 11/17/2019   Migraine, unspecified, not intractable, without status migrainosus 11/17/2019   Pain in right ankle and joints of right foot 07/26/2019   Status post total replacement of left hip 07/20/2018   Unilateral primary osteoarthritis, left hip 05/25/2018   Genetic testing 11/14/2016   Family history of ovarian cancer    Family history of colon cancer    Family history of uterine cancer    Family history of stomach cancer    Osteoarthritis of left knee 04/03/2015   History of left knee replacement 04/03/2015   Gait abnormality 12/22/2012   Swelling of ankle 12/22/2012   Hypertension    Headache    Arthritis    Vitamin D  deficiency     History obtained from: chart review and patient.  Discussed the use of AI scribe software for clinical note transcription with the patient and/or guardian, who gave verbal consent to proceed.  Sharon [name] was referred by Sharon Leita DEL, MD.     Sharon [name] is a 73 y.o. female presenting for an evaluation  of lip swelling.  She has been experiencing recurrent episodes of lip swelling since April, initially affecting the bottom lip and later involving the upper lip. The episodes occurred in April, mid-May, end of May, end of June, and three times in July. The swelling typically resolves within a day with the use of Benadryl  or Zyrtec, although sometimes multiple doses are needed. No emergency care has been required for these episodes.  She has not been on an ACE inhibitor.  There is no associated itching, pain, or extension of the swelling  into the mouth or throat. She has not identified any new medications, foods, or insect stings that correlate with the onset of symptoms. She switched from Zyrtec to Claritin due to dryness and takes it every morning. Since starting Claritin, she has not experienced any episodes in August.  In May, she was diagnosed with pneumonia, which coincided with one of the swelling episodes. She was treated with antibiotics, including penicillin and amoxicillin , which she has taken before without issues. She has no known environmental allergies or asthma, and no history of eczema or other skin issues.  She is retired, having worked as a Midwife for 34 years. She has a history of hypertension, for which she takes stable blood pressure medications.  During the review of symptoms, she reports occasional body itching unrelated to the lip swelling. No history of sinus infections, except for the recent pneumonia, which was treated without hospitalization. She is up to date on her cancer screenings, with a colonoscopy and mammogram scheduled for later this year.     Otherwise, there is no history of other atopic diseases, including drug allergies, stinging insect allergies, or contact dermatitis. There is no significant infectious history. Vaccinations are up to date.    Past Medical History: Patient Active Problem List   Diagnosis Date Noted   Status post revision of total replacement of left knee 06/09/2023   Liver cyst 01/28/2022   Pain in left shoulder 01/10/2021   Neck pain, acute 04/11/2020   Asymptomatic varicose veins of bilateral lower extremities 02/09/2020   Functional dyspepsia 11/22/2019   Aortic valve disorder 11/17/2019   Family history of cancer 11/17/2019   Hyperlipidemia 11/17/2019   Lactose intolerance 11/17/2019   Migraine, unspecified, not intractable, without status migrainosus 11/17/2019   Pain in right ankle and joints of right foot 07/26/2019   Status post total  replacement of left hip 07/20/2018   Unilateral primary osteoarthritis, left hip 05/25/2018   Genetic testing 11/14/2016   Family history of ovarian cancer    Family history of colon cancer    Family history of uterine cancer    Family history of stomach cancer    Osteoarthritis of left knee 04/03/2015   History of left knee replacement 04/03/2015   Gait abnormality 12/22/2012   Swelling of ankle 12/22/2012   Hypertension    Headache    Arthritis    Vitamin D  deficiency     Medication List:  Allergies as of 06/14/2024       Reactions   Codeine Other (See Comments)   headache   Pollen Extract Other (See Comments)   Blisters   Tape Other (See Comments)   Blisters   Ceftin Rash   Cefuroxime Axetil Rash        Medication List        Accurate as of June 14, 2024  1:54 PM. If you have any questions, ask your nurse or doctor.  aspirin  81 MG chewable tablet Chew 1 tablet (81 mg total) by mouth 2 (two) times daily.   atorvastatin  10 MG tablet Commonly known as: LIPITOR Take 10 mg by mouth See admin instructions. Monday, Wednesday, Friday.   CALCIUM  500 + D PO Take 2 each by mouth daily. Gummies   Cranberry 125 MG Tabs Take 1 tablet by mouth daily.   CULTURELLE WOMEN'S WELLNESS PO Take 1 capsule by mouth daily.   cycloSPORINE  0.05 % ophthalmic emulsion Commonly known as: RESTASIS  Place 1 drop into both eyes 2 (two) times daily.   diclofenac  Sodium 1 % Gel Commonly known as: Voltaren  Apply 2 g topically 4 (four) times daily.   fluticasone  50 MCG/ACT nasal spray Commonly known as: FLONASE  Place 1 spray into both nostrils daily as needed for allergies (spring/fall).   hydrochlorothiazide  25 MG tablet Commonly known as: HYDRODIURIL  Take 25 mg by mouth daily.   methocarbamol  500 MG tablet Commonly known as: ROBAXIN  TAKE 1 TABLET(500 MG) BY MOUTH TWICE DAILY AS NEEDED FOR MUSCLE SPASMS   SUMAtriptan  100 MG tablet Commonly known as:  IMITREX  Take 100 mg by mouth as needed for migraine or headache.   verapamil  240 MG (CO) 24 hr tablet Commonly known as: COVERA HS Take 240 mg by mouth 2 (two) times daily.   Vitamin D3 25 MCG (1000 UT) Caps Take 2 capsules by mouth daily.        Birth History: non-contributory  Developmental History: non-contributory  Past Surgical History: Past Surgical History:  Procedure Laterality Date   APPENDECTOMY  1969   BUNIONECTOMY Bilateral    EYE SURGERY Bilateral    JOINT REPLACEMENT     KNEE ARTHROSCOPY Left 11/2013   Precancerous lesion excised     arm   TONSILLECTOMY     TOTAL ABDOMINAL HYSTERECTOMY  1990   TAH BSO   TOTAL HIP ARTHROPLASTY Left 07/20/2018   TOTAL HIP ARTHROPLASTY Left 07/20/2018   Procedure: LEFT TOTAL HIP ARTHROPLASTY ANTERIOR APPROACH;  Surgeon: Vernetta Lonni GRADE, MD;  Location: [facility] OR;  Service: Orthopedics;  Laterality: Left;   TOTAL KNEE ARTHROPLASTY Right 2012   TOTAL KNEE ARTHROPLASTY Left 04/03/2015   Procedure: TOTAL KNEE ARTHROPLASTY;  Surgeon: Maude LELON Right, MD;  Location: [facility] OR;  Service: Orthopedics;  Laterality: Left;   TOTAL KNEE REVISION Left 06/09/2023   Procedure: Revision of Poly Lateral for Left Knee;  Surgeon: Vernetta Lonni GRADE, MD;  Location: [facility] ORS;  Service: Orthopedics;  Laterality: Left;     Family History: Family History  Problem Relation Age of Onset   Ovarian cancer Mother 26   Heart disease Maternal Grandfather    Hypertension Paternal Grandmother    Ovarian cancer Cousin        maternal first cousin dx in her 75s   Heart disease Father    Atrial fibrillation Father    Heart disease Brother    Heart disease Brother    Colon cancer Maternal Aunt        dx in her 73s-60   Diabetes Paternal Aunt    Ovarian cancer Maternal Grandmother        dx in her mid 101s   Cancer Maternal Grandmother        OVARIAN AND UTERINE   Uterine cancer Maternal Grandmother        dx in her early 93s   Stomach cancer  Maternal Uncle        dx in his 78s-80s   Stroke Maternal Uncle  Leukemia Paternal Uncle      Social History: Kevona lives at home with her husband.  They live in a house.  They have gas heating and central cooling.  There are no indoor animals.  There are no dust mite covers on the bedding.  There is no tobacco exposure.  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  They do not [place] near an interstate or industrial area.  There is no HEPA filter in the home.  She is currently looking to downsize with her husband, who has developed mild dementia.   Review of systems otherwise negative other than that mentioned in the HPI.    Objective:   Blood pressure 130/82, pulse 75, temperature 98 F (36.7 C), temperature source Temporal, height 5' 6.5 (1.689 m), weight 162 lb 3.2 oz (73.6 kg), last menstrual period 02/17/1989, SpO2 95%. Body mass index is 25.79 kg/m.     Physical Exam Vitals reviewed.  Constitutional:      Appearance: She is well-developed.  HENT:     Head: Normocephalic and atraumatic.     Right Ear: Tympanic membrane, ear canal and external ear normal. No drainage, swelling or tenderness. Tympanic membrane is not injected, scarred, erythematous, retracted or bulging.     Left Ear: Tympanic membrane, ear canal and external ear normal. No drainage, swelling or tenderness. Tympanic membrane is not injected, scarred, erythematous, retracted or bulging.     Nose: No nasal deformity, septal deviation, mucosal edema or rhinorrhea.     Right Turbinates: Enlarged and swollen.     Left Turbinates: Enlarged and swollen.     Right Sinus: No maxillary sinus tenderness or frontal sinus tenderness.     Left Sinus: No maxillary sinus tenderness or frontal sinus tenderness.     Mouth/Throat:     Mouth: Mucous membranes are not pale and not dry.     Pharynx: Uvula midline.  Eyes:     General:        Right eye: No discharge.        Left eye: No discharge.      Conjunctiva/sclera: Conjunctivae normal.     Right eye: Right conjunctiva is not injected. No chemosis.    Left eye: Left conjunctiva is not injected. No chemosis.    Pupils: Pupils are equal, round, and reactive to light.  Cardiovascular:     Rate and Rhythm: Normal rate and regular rhythm.     Heart sounds: Normal heart sounds.  Pulmonary:     Effort: Pulmonary effort is normal. No tachypnea, accessory muscle usage or respiratory distress.     Breath sounds: Normal breath sounds. No wheezing, rhonchi or rales.     Comments: Moving air well in all lung fields.  No increased work of breathing. Chest:     Chest wall: No tenderness.  Abdominal:     Tenderness: There is no abdominal tenderness. There is no guarding or rebound.  Lymphadenopathy:     Head:     Right side of head: No submandibular, tonsillar or occipital adenopathy.     Left side of head: No submandibular, tonsillar or occipital adenopathy.     Cervical: No cervical adenopathy.  Skin:    General: Skin is warm.     Capillary Refill: Capillary refill takes less than 2 seconds.     Coloration: Skin is not pale.     Findings: No abrasion, erythema, petechiae or rash. Rash is not papular, urticarial or vesicular.     Comments: No  eczematous or urticarial lesions noted.  Neurological:     Mental Status: She is alert.  Psychiatric:        Behavior: Behavior is cooperative.      Diagnostic studies: labs sent instead         Marty Shaggy, MD [facility] 

## 2024-06-14 NOTE — Patient Instructions (Addendum)
 1. Swelling of lip, tongue, and throat - We are going to get some labs to look for weird causes of swelling, including labs to look for hereditary angioedema. - This is unlikely, but we should do our due diligence to make sure.  - We are going to contact you in 1-2 weeks with the results of the testing.  - I would recommend doing high dose antihistamines at the FIRST SIGN of lip swelling to see if this helps decrease the length of the episodes.  AM: Zyrtec (10mg ) + Pepcid (famotidine) 20mg    PM: Zyrtec (10mg ) + Pepcid (famotidine) 20mg   - Continue with the Claritin (loratadine) daily.  - We are going to do testing to the environmental allergens and the most common foods at the next visit. - Stop the Claritin for 3 days before the next appointment.   2. Return in about 1 week (around 06/21/2024) for ALLERGY TESTING (1-68). You can have the follow up appointment with Dr. Iva or a Nurse Practicioner (our Nurse Practitioners are excellent and always have Physician oversight!).    Please inform us  of any Emergency Department visits, hospitalizations, or changes in symptoms. Call us  before going to the ED for breathing or allergy symptoms since we might be able to fit you in for a sick visit. Feel free to contact us  anytime with any questions, problems, or concerns.  It was a pleasure to meet you today!  Websites that have reliable patient information: 1. American Academy of Asthma, Allergy, and Immunology: www.aaaai.org 2. Food Allergy Research and Education (FARE): foodallergy.org 3. Mothers of Asthmatics: http://www.asthmacommunitynetwork.org 4. American College of Allergy, Asthma, and Immunology: www.acaai.org      "Like" us  on Facebook and Instagram for our latest updates!      A healthy democracy works best when [employer] participate! Make sure you are registered to vote! If you have moved or changed any of your contact information, you will need to get this updated before  voting! Scan the QR codes below to learn more!

## 2024-06-15 DIAGNOSIS — R220 Localized swelling, mass and lump, head: Secondary | ICD-10-CM | POA: Diagnosis not present

## 2024-06-15 DIAGNOSIS — R221 Localized swelling, mass and lump, neck: Secondary | ICD-10-CM | POA: Diagnosis not present

## 2024-06-22 ENCOUNTER — Telehealth: Payer: Self-pay

## 2024-06-22 LAB — C1 ESTERASE INHIBITOR: C1INH SerPl-mCnc: 31 mg/dL (ref 21–39)

## 2024-06-22 LAB — COMPLEMENT COMPONENT C1Q: Complement C1Q: 16.1 mg/dL (ref 10.3–20.5)

## 2024-06-22 LAB — C3 AND C4
Complement C3, Serum: 126 mg/dL (ref 82–167)
Complement C4, Serum: 21 mg/dL (ref 12–38)

## 2024-06-22 LAB — C1 ESTERASE INHIBITOR, FUNCTIONAL: C1INH Functional/C1INH Total MFr SerPl: >105 %{normal}

## 2024-06-22 LAB — TRYPTASE: Tryptase: 4.6 ug/L (ref 2.2–13.2)

## 2024-06-22 NOTE — Telephone Encounter (Signed)
 Received onbase communication from [facility], confirming results in chart

## 2024-06-23 ENCOUNTER — Encounter: Payer: Self-pay | Admitting: Allergy & Immunology

## 2024-06-23 ENCOUNTER — Ambulatory Visit (INDEPENDENT_AMBULATORY_CARE_PROVIDER_SITE_OTHER): Admitting: Allergy & Immunology

## 2024-06-23 DIAGNOSIS — R221 Localized swelling, mass and lump, neck: Secondary | ICD-10-CM

## 2024-06-23 DIAGNOSIS — J310 Chronic rhinitis: Secondary | ICD-10-CM

## 2024-06-23 DIAGNOSIS — R220 Localized swelling, mass and lump, head: Secondary | ICD-10-CM

## 2024-06-23 NOTE — Patient Instructions (Addendum)
 1. Swelling of lip, tongue, and throat - Testing today was negative to the most common foods as well as her environmental allergy  panel. - This rules out around 95% of all food allergies which is great news. - Likewise, all of your labs that we did at the last visit were normal. - This rules out something called hereditary angioedema as well as acquired angioedema, which is a swelling condition that can occur as a side effect of certain cancers. - So this is all good news! - We could consider starting a drug called Ordaleyo as a daily preventative medication. - This can be effective in treating a condition called Hereditary Angioedema with a normal C1 inhibitor (kind of a waste basket term to denote swelling episodes that occurs with normal labs). - BUT this type of swelling typically not very responsive to antihistamines like Benadryl , so given the fact that yours is responsive to Benadryl  makes this have a less likely diagnosis. - I would recommend doing high dose antihistamines at the FIRST SIGN of lip swelling to see if this helps decrease the length of the episodes.  AM: Zyrtec (10mg ) + Pepcid (famotidine) 20mg    PM: Zyrtec (10mg ) + Pepcid (famotidine) 20mg   - Continue with the Claritin (loratadine) daily.  - We are going to do testing to the environmental allergens and the most common foods at the next visit. - Stop the Claritin for 3 days before the next appointment.   2. Return in about 3 months (around 09/22/2024). You can have the follow up appointment with Dr. Iva or a Nurse Practicioner (our Nurse Practitioners are excellent and always have Physician oversight!).    Please inform us  of any Emergency Department visits, hospitalizations, or changes in symptoms. Call us  before going to the ED for breathing or allergy  symptoms since we might be able to fit you in for a sick visit. Feel free to contact us  anytime with any questions, problems, or concerns.  It was a pleasure to meet  you today!  Websites that have reliable patient information: 1. American Academy of Asthma, Allergy , and Immunology: www.aaaai.org 2. Food Allergy  Research and Education (FARE): foodallergy.org 3. Mothers of Asthmatics: http://www.asthmacommunitynetwork.org 4. American College of Allergy , Asthma, and Immunology: www.acaai.org      "Like" us  on Facebook and Instagram for our latest updates!      A healthy democracy works best when [employer] participate! Make sure you are registered to vote! If you have moved or changed any of your contact information, you will need to get this updated before voting! Scan the QR codes below to learn more!       Airborne Adult Perc - 06/23/24 1000     Time Antigen Placed 1025    Allergen Manufacturer Jestine    Location Back    Number of Test 55    1. Control-Buffer 50% Glycerol Negative    2. Control-Histamine 2+    3. Bahia Negative    4. French Southern Territories Negative    5. Johnson Negative    6. [place]  Blue Negative    7. Meadow Fescue Negative    8. Perennial Rye Negative    9. Timothy Negative    10. Ragweed Mix Negative    11. Cocklebur Negative    12. Plantain,  English Negative    13. Baccharis Negative    14. Dog Fennel Negative    15. Russian Thistle Negative    16. Lamb's Quarters Negative    17. Sheep Sorrell Negative  18. Rough Pigweed Negative    19. Marsh Elder, Rough Negative    20. Mugwort, Common Negative    21. Box, Elder Negative    22. Cedar, red Negative    23. Sweet Gum Negative    24. Pecan Pollen Negative    25. Pine Mix Negative    26. Walnut, Black Pollen Negative    27. Red Mulberry Negative    28. Ash Mix Negative    29. Birch Mix Negative    30. Beech American Negative    31. Cottonwood, Guinea-Bissau Negative    32. Hickory, White Negative    33. Maple Mix Negative    34. Oak, Guinea-[place]iss[place] Mix Negative    35. Sycamore Eastern Negative    36. Alternaria Alternata Negative    37. Cladosporium Herbarum Negative     38. Aspergillus Mix Negative    39. Penicillium Mix Negative    40. Bipolaris Sorokiniana (Helminthosporium) Negative    41. Drechslera Spicifera (Curvularia) Negative    42. Mucor Plumbeus Negative    43. Fusarium Moniliforme Negative    44. Aureobasidium Pullulans (pullulara) Negative    45. Rhizopus Oryzae Negative    46. Botrytis Cinera Negative    47. Epicoccum Nigrum Negative    48. Phoma Betae Negative    49. Dust Mite Mix Negative    50. Cat Hair 10,000 BAU/ml Negative    51.  Dog Epithelia Negative    52. Mixed Feathers Negative    53. Horse Epithelia Negative    54. Cockroach, German Negative    55. Tobacco Leaf Negative          13 Food Perc - 06/23/24 1000       Test Information   Time Antigen Placed 1026    Allergen Manufacturer Greer    Location Back    Number of allergen test 13      Food   1. Peanut Negative    2. Soybean Negative    3. Wheat Negative    4. Sesame Negative    5. Milk, Cow Negative    6. Casein Negative    7. Egg White, Chicken Negative    8. Shellfish Mix Negative    9. Fish Mix Negative    10. Cashew Negative    11. Walnut Food Negative    12. Almond Negative    13. Hazelnut Negative

## 2024-06-23 NOTE — Progress Notes (Signed)
 FOLLOW UP  Date of Service/Encounter:  06/23/24   Assessment:   Swelling of lip, tongue, and throat - with negative testing today   Complicated past medical history including migraines, osteoarthritis, and GERD  Plan/Recommendations:   1. Swelling of lip, tongue, and throat - Testing today was negative to the most common foods as well as her environmental allergy  panel. - This rules out around 95% of all food allergies which is great news. - Likewise, all of your labs that we did at the last visit were normal. - This rules out something called hereditary angioedema as well as acquired angioedema, which is a swelling condition that can occur as a side effect of certain cancers. - So this is all good news! - We could consider starting a drug called Ordaleyo as a daily preventative medication. - This can be effective in treating a condition called Hereditary Angioedema with a normal C1 inhibitor (kind of a waste basket term to denote swelling episodes that occurs with normal labs). - BUT this type of swelling typically not very responsive to antihistamines like Benadryl , so given the fact that yours is responsive to Benadryl  makes this have a less likely diagnosis. - I would recommend doing high dose antihistamines at the FIRST SIGN of lip swelling to see if this helps decrease the length of the episodes.  AM: Zyrtec (10mg ) + Pepcid (famotidine) 20mg    PM: Zyrtec (10mg ) + Pepcid (famotidine) 20mg   - Continue with the Claritin (loratadine) daily.  - We are going to do testing to the environmental allergens and the most common foods at the next visit. - Stop the Claritin for 3 days before the next appointment.   2. Return in about 3 months (around 09/22/2024). You can have the follow up appointment with Dr. Iva or a Nurse Practicioner (our Nurse Practitioners are excellent and always have Physician oversight!).   Subjective:   Sharon [name] is a 73 y.o. female presenting  today for follow up of  Chief Complaint  Patient presents with   Allergy  Testing    [name] has a history of the following: Patient Active Problem List   Diagnosis Date Noted   Status post revision of total replacement of left knee 06/09/2023   Liver cyst 01/28/2022   Pain in left shoulder 01/10/2021   Neck pain, acute 04/11/2020   Asymptomatic varicose veins of bilateral lower extremities 02/09/2020   Functional dyspepsia 11/22/2019   Aortic valve disorder 11/17/2019   Family history of cancer 11/17/2019   Hyperlipidemia 11/17/2019   Lactose intolerance 11/17/2019   Migraine, unspecified, not intractable, without status migrainosus 11/17/2019   Pain in right ankle and joints of right foot 07/26/2019   Status post total replacement of left hip 07/20/2018   Unilateral primary osteoarthritis, left hip 05/25/2018   Genetic testing 11/14/2016   Family history of ovarian cancer    Family history of colon cancer    Family history of uterine cancer    Family history of stomach cancer    Osteoarthritis of left knee 04/03/2015   History of left knee replacement 04/03/2015   Gait abnormality 12/22/2012   Swelling of ankle 12/22/2012   Hypertension    Headache    Arthritis    Vitamin D  deficiency     History obtained from: chart review and patient.  Discussed the use of AI scribe software for clinical note transcription with the patient and/or guardian, who gave verbal consent to proceed.  Sharon [name] is a 73  y.o. female presenting for skin testing. She was last seen on August 26th. We could not do testing because her insurance company does not cover testing on the same day as a New Patient visit. She has been off of all antihistamines 3 days in anticipation of the testing.   At that visit, we obtained labs to look for causes of swelling and hives.  We started Zyrtec and Pepcid twice daily at the first sign of hives.  We did labs to look for hereditary angioedema.  These were  all normal.  Otherwise, there have been no changes to her past medical history, surgical history, family history, or social history.    Review of systems otherwise negative other than that mentioned in the HPI.    Objective:   Last menstrual period 02/17/1989. There is no height or weight on file to calculate BMI.    Physical exam deferred since this was a skin testing appointment only.   Diagnostic studies:   Allergy  Studies:     Airborne Adult Perc - 06/23/24 1000     Time Antigen Placed 1025    Allergen Manufacturer Jestine    Location Back    Number of Test 55    1. Control-Buffer 50% Glycerol Negative    2. Control-Histamine 2+    3. Bahia Negative    4. French Southern Territories Negative    5. Johnson Negative    6. Kentucky  Blue Negative    7. Meadow Fescue Negative    8. Perennial Rye Negative    9. Timothy Negative    10. Ragweed Mix Negative    11. Cocklebur Negative    12. Plantain,  English Negative    13. Baccharis Negative    14. Dog Fennel Negative    15. Russian Thistle Negative    16. Lamb's Quarters Negative    17. Sheep Sorrell Negative    18. Rough Pigweed Negative    19. Marsh Elder, Rough Negative    20. Mugwort, Common Negative    21. Box, Elder Negative    22. Cedar, red Negative    23. Sweet Gum Negative    24. Pecan Pollen Negative    25. Pine Mix Negative    26. Walnut, Black Pollen Negative    27. Red Mulberry Negative    28. Ash Mix Negative    29. Birch Mix Negative    30. Beech American Negative    31. Cottonwood, Guinea-Bissau Negative    32. Hickory, White Negative    33. Maple Mix Negative    34. Oak, [place] Mix Negative    35. Sycamore Eastern Negative    36. Alternaria Alternata Negative    37. Cladosporium Herbarum Negative    38. Aspergillus Mix Negative    39. Penicillium Mix Negative    40. Bipolaris Sorokiniana (Helminthosporium) Negative    41. Drechslera Spicifera (Curvularia) Negative    42. Mucor Plumbeus Negative    43.  Fusarium Moniliforme Negative    44. Aureobasidium Pullulans (pullulara) Negative    45. Rhizopus Oryzae Negative    46. Botrytis Cinera Negative    47. Epicoccum Nigrum Negative    48. Phoma Betae Negative    49. Dust Mite Mix Negative    50. Cat Hair 10,000 BAU/ml Negative    51.  Dog Epithelia Negative    52. Mixed Feathers Negative    53. Horse Epithelia Negative    54. Cockroach, German Negative    55. Tobacco Leaf Negative  13 Food Perc - 06/23/24 1000       Test Information   Time Antigen Placed 1026    Allergen Manufacturer Greer    Location Back    Number of allergen test 13      Food   1. Peanut Negative    2. Soybean Negative    3. Wheat Negative    4. Sesame Negative    5. Milk, Cow Negative    6. Casein Negative    7. Egg White, Chicken Negative    8. Shellfish Mix Negative    9. Fish Mix Negative    10. Cashew Negative    11. Walnut Food Negative    12. Almond Negative    13. Hazelnut Negative          Allergy  testing results were read and interpreted by myself, documented by clinical staff.      Marty Shaggy, MD  [facility] 

## 2024-06-24 ENCOUNTER — Ambulatory Visit (INDEPENDENT_AMBULATORY_CARE_PROVIDER_SITE_OTHER)

## 2024-06-24 ENCOUNTER — Ambulatory Visit: Payer: Self-pay | Admitting: Allergy & Immunology

## 2024-06-24 DIAGNOSIS — B351 Tinea unguium: Secondary | ICD-10-CM

## 2024-06-24 NOTE — Progress Notes (Signed)
 Patient presents today for the 5th laser treatment. Diagnosed with mycotic nail infection by Dr. Silva.   Toenail most affected 1-5 bilateral.  All other systems are negative.  Nails were filed thin. Laser therapy was administered to 1-5 toenails bilateral and patient tolerated the treatment well. All safety precautions were in place.    Follow up in 8 weeks for laser # 6.

## 2024-06-28 DIAGNOSIS — H900 Conductive hearing loss, bilateral: Secondary | ICD-10-CM | POA: Diagnosis not present

## 2024-06-28 DIAGNOSIS — R220 Localized swelling, mass and lump, head: Secondary | ICD-10-CM | POA: Diagnosis not present

## 2024-06-28 DIAGNOSIS — H6123 Impacted cerumen, bilateral: Secondary | ICD-10-CM | POA: Diagnosis not present

## 2024-06-29 DIAGNOSIS — H9201 Otalgia, right ear: Secondary | ICD-10-CM | POA: Diagnosis not present

## 2024-06-29 DIAGNOSIS — H9221 Otorrhagia, right ear: Secondary | ICD-10-CM | POA: Diagnosis not present

## 2024-07-11 ENCOUNTER — Ambulatory Visit (INDEPENDENT_AMBULATORY_CARE_PROVIDER_SITE_OTHER): Admitting: Physician Assistant

## 2024-07-11 DIAGNOSIS — M7052 Other bursitis of knee, left knee: Secondary | ICD-10-CM

## 2024-07-11 DIAGNOSIS — M7632 Iliotibial band syndrome, left leg: Secondary | ICD-10-CM | POA: Diagnosis not present

## 2024-07-11 MED ORDER — METHYLPREDNISOLONE ACETATE 40 MG/ML IJ SUSP
20.0000 mg | INTRAMUSCULAR | Status: AC | PRN
  Administered 2024-07-11: 20 mg via INTRAMUSCULAR

## 2024-07-11 MED ORDER — LIDOCAINE HCL 1 % IJ SOLN
3.0000 mL | INTRAMUSCULAR | Status: AC | PRN
  Administered 2024-07-11: 3 mL

## 2024-07-11 MED ORDER — LIDOCAINE HCL 1 % IJ SOLN
0.5000 mL | INTRAMUSCULAR | Status: AC | PRN
  Administered 2024-07-11: .5 mL

## 2024-07-11 MED ORDER — METHYLPREDNISOLONE ACETATE 40 MG/ML IJ SUSP
40.0000 mg | INTRAMUSCULAR | Status: AC | PRN
  Administered 2024-07-11: 40 mg via INTRA_ARTICULAR

## 2024-07-11 NOTE — Progress Notes (Signed)
 Office Visit Note   Patient: Sharon [name]           Date of Birth: 03-12-1951           MRN: [id number] Visit Date: 07/11/2024              Requested by: Stephane Leita DEL, MD [address] [place],  [place] [address] PCP: Stephane Leita DEL, MD   Assessment & Plan: Visit Diagnoses:  1. Pes anserinus bursitis of left knee   2. Iliotibial band syndrome of left side     Plan:  Will have her stop abduction exercises.  She is given a prescription for O'Halloran for trochanteric bursitis/IT band and pes anserinus bursitis left lower extremity.  They will work on stretching exercises, include home exercise program and modalities.  She will follow-up with us  in 4 weeks see how she is doing overall.  She tolerated both injections well today.  Follow-Up Instructions: Return in about 4 weeks (around 08/08/2024).   Orders:  No orders of the defined types were placed in this encounter.  No orders of the defined types were placed in this encounter.     Procedures: Trigger Point Inj  Date/Time: 07/11/2024 10:16 AM  Performed by: Gretta Bertrum ORN, PA-C Authorized by: Gretta Bertrum ORN, PA-C   Consent Given by:  Patient Site marked: the procedure site was marked   Timeout: prior to procedure the correct patient, procedure, and site was verified   Indications:  Pain and therapeutic Total # of Trigger Points:  1 Location: lower extremity   Needle Size:  25 G Approach:  Medial Medications #1:  0.5 mL lidocaine  1 %; 20 mg methylPREDNISolone  acetate 40 MG/ML Large Joint Inj: L greater trochanter on 07/11/2024 10:17 AM Indications: pain Details: 22 G 1.5 in needle, lateral approach  Arthrogram: No  Medications: 3 mL lidocaine  1 %; 40 mg methylPREDNISolone  acetate 40 MG/ML Outcome: tolerated well, no immediate complications Procedure, treatment alternatives, risks and benefits explained, specific risks discussed. Consent was given by the patient. Immediately prior to procedure a time out was  called to verify the correct patient, procedure, equipment, support staff and site/side marked as required. Patient was prepped and draped in the usual sterile fashion.       Clinical Data: No additional findings.   Subjective: Chief Complaint  Patient presents with   Left Hip - Pain   Left Knee - Pain    HPI Sharon [name] returns today due to left knee and hip pain.  She states the pain started in the left knee.  History of left total knee arthroplasty.  No known injury.  She has been working out and doing abduction exercises and had soreness to the lateral aspect of the left hip x 3 weeks.  This morning she bent over and felt like the hip may have popped out and then popped back in.  History of left total hip arthroplasty.  She notes she has difficulty going up and down stairs.  States she has pain when lifting leg up to go up steps.  Rare numbness tingling down the leg.  Some intermittent groin pain on the left.  Negative fevers chills.  Nondiabetic. Review of Systems See HPI  Objective: Vital Signs: LMP 02/17/1989   Physical Exam Constitutional:      Appearance: She is not ill-appearing or diaphoretic.  Pulmonary:     Effort: Pulmonary effort is normal.  Neurological:     Mental Status: She is alert and oriented  to person, place, and time.  Psychiatric:        Mood and Affect: Mood normal.     Ortho Exam Bilateral knees: Good range of motion both knees no abnormal warmth erythema or effusion.  Well-healed surgical incisions.  No gross instability of either knee.  Tenderness left knee over the pes anserinus region. Bilateral hips: Excellent range of motion of both hips.  Left hip extreme internal/external rotation causes discomfort laterally.  She has tenderness over the left left trochanteric region and down the IT band.  Straight leg raise is negative bilaterally.  Specialty Comments:  CLINICAL DATA:  Low back pain with right hip and leg pain for 1 month. Right foot  numbness. No known injury or prior relevant surgery.   EXAM: MRI LUMBAR SPINE WITHOUT CONTRAST   TECHNIQUE: Multiplanar, multisequence MR imaging of the lumbar spine was performed. No intravenous contrast was administered.   COMPARISON:  Lumbar MRI 01/26/2021 and 11/04/2017.   FINDINGS: Segmentation: Conventional anatomy assumed, with the last open disc space designated L5-S1.Concordant with previous imaging.   Alignment: Convex right scoliosis centered at L3, similar to previous study. Normal lateral alignment.   Vertebrae: No worrisome osseous lesion, acute fracture or pars defect. Previously demonstrated endplate degenerative changes at L1-2 have mildly improved. Mild sacroiliac degenerative changes bilaterally.   Conus medullaris: Extends to the L1-2 level and appears normal.   Paraspinal and other soft tissues: No significant paraspinal findings. Simple appearing cyst in the upper pole of the right kidney for which no follow-up imaging is recommended.   Disc levels:   No significant disc space findings at T11-12 or T12-L1.   L1-2: Chronic spondylosis with loss of disc height, annular disc bulging and endplate osteophytes. Endplate edema noted previously has improved. Mild left foraminal narrowing without nerve root encroachment.   L2-3: Preserved disc height. Mild disc bulging and facet hypertrophy. No spinal stenosis or nerve root encroachment.   L3-4: Stable mild loss of disc height with annular disc bulging eccentric to the left. Mild facet and ligamentous hypertrophy. Stable mild narrowing of the left lateral recess and left foramen without nerve root encroachment.   L4-5: Mild loss of disc height with annular disc bulging. Moderate facet and ligamentous hypertrophy. Stable mild lateral recess narrowing bilaterally. The foramina appear sufficiently patent.   L5-S1: Chronic loss of disc height with annular disc bulging and endplate osteophytes asymmetric to  the right. Mild facet and ligamentous hypertrophy. There is right foraminal narrowing which appears progressive with probable right L5 nerve root encroachment. Stable mild narrowing of the right lateral recess and left foramen.   IMPRESSION: 1. Compared with previous MRI from 2018, there is progressive right foraminal narrowing at L5-S1 due to disc bulging, endplate osteophytes and facet hypertrophy. There is probable right L5 nerve root encroachment. 2. No other significant changes, spinal stenosis or nerve root encroachment. Stable mild lateral recess narrowing bilaterally at L4-5 and mild narrowing of the left lateral recess and left foramen at L3-4. 3. Chronic spondylosis at L1-2 with improved endplate edema.     Electronically Signed   By: Elsie Perone M.D.   On: 07/26/2022 13:59  Imaging: No results found.   PMFS History: Patient Active Problem List   Diagnosis Date Noted   Status post revision of total replacement of left knee 06/09/2023   Liver cyst 01/28/2022   Pain in left shoulder 01/10/2021   Neck pain, acute 04/11/2020   Asymptomatic varicose veins of bilateral lower extremities 02/09/2020  Functional dyspepsia 11/22/2019   Aortic valve disorder 11/17/2019   Family history of cancer 11/17/2019   Hyperlipidemia 11/17/2019   Lactose intolerance 11/17/2019   Migraine, unspecified, not intractable, without status migrainosus 11/17/2019   Pain in right ankle and joints of right foot 07/26/2019   Status post total replacement of left hip 07/20/2018   Unilateral primary osteoarthritis, left hip 05/25/2018   Genetic testing 11/14/2016   Family history of ovarian cancer    Family history of colon cancer    Family history of uterine cancer    Family history of stomach cancer    Osteoarthritis of left knee 04/03/2015   History of left knee replacement 04/03/2015   Gait abnormality 12/22/2012   Swelling of ankle 12/22/2012   Hypertension    Headache     Arthritis    Vitamin D  deficiency    Past Medical History:  Diagnosis Date   Angio-edema    Cataract    Family history of adverse reaction to anesthesia    mom did; don't know what happened (07/20/2018)   Family history of colon cancer    Family history of ovarian cancer    Family history of stomach cancer    Family history of uterine cancer    Fibroid    GERD (gastroesophageal reflux disease)    Heart murmur    tiny, tiny one (07/20/2018)   High cholesterol    Hypertension    Migraine    a few/year; last one was 07/19/2018 (07/20/2018)   Osteoarthritis    knees, hips, hands, probably in my back (07/20/2018)   Recurrent upper respiratory infection (URI)    Ringworm    Vitamin D  deficiency     Family History  Problem Relation Age of Onset   Ovarian cancer Mother 35   Heart disease Maternal Grandfather    Hypertension Paternal Grandmother    Ovarian cancer Cousin        maternal first cousin dx in her 50s   Heart disease Father    Atrial fibrillation Father    Heart disease Brother    Heart disease Brother    Colon cancer Maternal Aunt        dx in her 46s-60   Diabetes Paternal Aunt    Ovarian cancer Maternal Grandmother        dx in her mid 67s   Cancer Maternal Grandmother        OVARIAN AND UTERINE   Uterine cancer Maternal Grandmother        dx in her early 43s   Stomach cancer Maternal Uncle        dx in his 56s-80s   Stroke Maternal Uncle    Leukemia Paternal Uncle     Past Surgical History:  Procedure Laterality Date   APPENDECTOMY  1969   BUNIONECTOMY Bilateral    EYE SURGERY Bilateral    JOINT REPLACEMENT     KNEE ARTHROSCOPY Left 11/2013   Precancerous lesion excised     arm   TONSILLECTOMY     TOTAL ABDOMINAL HYSTERECTOMY  1990   TAH BSO   TOTAL HIP ARTHROPLASTY Left 07/20/2018   TOTAL HIP ARTHROPLASTY Left 07/20/2018   Procedure: LEFT TOTAL HIP ARTHROPLASTY ANTERIOR APPROACH;  Surgeon: Vernetta Lonni GRADE, MD;  Location: [facility] OR;   Service: Orthopedics;  Laterality: Left;   TOTAL KNEE ARTHROPLASTY Right 2012   TOTAL KNEE ARTHROPLASTY Left 04/03/2015   Procedure: TOTAL KNEE ARTHROPLASTY;  Surgeon: Maude LELON Right, MD;  Location: [facility] OR;  Service: Orthopedics;  Laterality: Left;   TOTAL KNEE REVISION Left 06/09/2023   Procedure: Revision of Poly Lateral for Left Knee;  Surgeon: Vernetta Lonni GRADE, MD;  Location: [facility];  Service: Orthopedics;  Laterality: Left;   Social History   Occupational History   Not on file  Tobacco Use   Smoking status: Never   Smokeless tobacco: Never  Vaping Use   Vaping status: Never Used  Substance and Sexual Activity   Alcohol  use: Yes    Alcohol /week: 1.0 - 2.0 standard drink of alcohol     Types: 1 - 2 Standard drinks or equivalent per week    Comment: rarely   Drug use: No   Sexual activity: Not Currently    Partners: Male    Birth control/protection: Surgical, Post-menopausal

## 2024-07-14 DIAGNOSIS — H9221 Otorrhagia, right ear: Secondary | ICD-10-CM | POA: Diagnosis not present

## 2024-07-15 ENCOUNTER — Telehealth: Payer: Self-pay | Admitting: Physician Assistant

## 2024-07-15 ENCOUNTER — Telehealth: Payer: Self-pay | Admitting: Orthopaedic Surgery

## 2024-07-15 NOTE — Telephone Encounter (Signed)
 Patient called and said the Rehab where he sent a referral wants the diagnosis for why she needs to come there. CB# [phone] Fax# [phone]

## 2024-07-15 NOTE — Telephone Encounter (Signed)
 LMOM for patient to return call; what PT? [facility]? Did she give them Rx?

## 2024-07-15 NOTE — Telephone Encounter (Signed)
 Pt returned call to [facility] and would like a call back Monday. Pt phone number is [phone]

## 2024-07-18 NOTE — Telephone Encounter (Signed)
 Called patient, no answer

## 2024-07-19 ENCOUNTER — Telehealth: Payer: Self-pay | Admitting: Physician Assistant

## 2024-08-02 DIAGNOSIS — H16223 Keratoconjunctivitis sicca, not specified as Sjogren's, bilateral: Secondary | ICD-10-CM | POA: Diagnosis not present

## 2024-08-02 DIAGNOSIS — M7632 Iliotibial band syndrome, left leg: Secondary | ICD-10-CM | POA: Diagnosis not present

## 2024-08-02 DIAGNOSIS — H52223 Regular astigmatism, bilateral: Secondary | ICD-10-CM | POA: Diagnosis not present

## 2024-08-02 DIAGNOSIS — H43813 Vitreous degeneration, bilateral: Secondary | ICD-10-CM | POA: Diagnosis not present

## 2024-08-02 DIAGNOSIS — M79605 Pain in left leg: Secondary | ICD-10-CM | POA: Diagnosis not present

## 2024-08-02 DIAGNOSIS — H5203 Hypermetropia, bilateral: Secondary | ICD-10-CM | POA: Diagnosis not present

## 2024-08-02 DIAGNOSIS — H26493 Other secondary cataract, bilateral: Secondary | ICD-10-CM | POA: Diagnosis not present

## 2024-08-08 ENCOUNTER — Ambulatory Visit: Admitting: Physician Assistant

## 2024-08-08 ENCOUNTER — Encounter: Payer: Self-pay | Admitting: Physician Assistant

## 2024-08-08 DIAGNOSIS — M7632 Iliotibial band syndrome, left leg: Secondary | ICD-10-CM

## 2024-08-08 DIAGNOSIS — M7061 Trochanteric bursitis, right hip: Secondary | ICD-10-CM

## 2024-08-08 NOTE — Progress Notes (Signed)
 HPI: [name] returns today for follow-up of her left hip pain.  She states the injection helped.  She feels between the injection and therapy she is 50% better.  She is having no radicular symptoms down the leg.  She states her regular therapy starts this Thursday.  She does have a home exercise program she.  She has found some of the modalities they have tried at therapy so far be beneficial.  Notes the hip pain to be 2 out of 10 pain.   She also notes that her left second toe became black and blue and noticed this when getting out of the pool the other day no known injury no swelling.  Review of systems: See HPI otherwise negative  Physical exam: General Well-developed well-nourished female in no acute distress ambulates without any assistive device nonantalgic gait.  Left hip good range of motion.  Slight tenderness over the trochanteric region. Left second toe at the MTP joint there is some vascular congestion in the area of either burst vessel or bruising.  Nontender over the second metacarpal head.  Dorsal pedal pulses 2+.   Impression: Left hip trochanteric bursitis  Plan: She will continue therapy continue home exercise program as taught by therapy follow-up with us  as needed.  In regards to the area of bruising versus varicose vein.  Recommend no treatment at this time as she is having no pain.  Questions were encouraged and answered at length

## 2024-08-10 DIAGNOSIS — M79605 Pain in left leg: Secondary | ICD-10-CM | POA: Diagnosis not present

## 2024-08-10 DIAGNOSIS — M7632 Iliotibial band syndrome, left leg: Secondary | ICD-10-CM | POA: Diagnosis not present

## 2024-08-22 ENCOUNTER — Encounter: Payer: Self-pay | Admitting: Radiology

## 2024-09-13 ENCOUNTER — Ambulatory Visit (INDEPENDENT_AMBULATORY_CARE_PROVIDER_SITE_OTHER): Payer: Self-pay

## 2024-09-13 DIAGNOSIS — B351 Tinea unguium: Secondary | ICD-10-CM

## 2024-09-13 NOTE — Progress Notes (Signed)
 Patient presents today for the 6th laser treatment. Diagnosed with mycotic nail infection by Dr. Silva.   Toenail most affected 1-5 bilateral. Patient still has some affected nails, could use a few more treatments. She also has very thick and calloused skin around a few of her nails. Advised following up with provider to have these addressed. Patient agreed with plan.   All other systems are negative.  Nails were trimmed and filed thin. Laser therapy was administered to 1-5 toenails bilaterally and patient tolerated the treatment well. All safety precautions were in place.   Post treatment instructions reviewed and provided to patient. Patient had no questions regarding plan of care.   Follow up in 3 months with Dr. Silva.

## 2024-09-29 ENCOUNTER — Encounter: Payer: Self-pay | Admitting: Allergy & Immunology

## 2024-09-29 ENCOUNTER — Ambulatory Visit: Admitting: Allergy & Immunology

## 2024-09-29 ENCOUNTER — Other Ambulatory Visit: Payer: Self-pay

## 2024-09-29 DIAGNOSIS — R220 Localized swelling, mass and lump, head: Secondary | ICD-10-CM | POA: Diagnosis not present

## 2024-09-29 DIAGNOSIS — J310 Chronic rhinitis: Secondary | ICD-10-CM | POA: Diagnosis not present

## 2024-09-29 DIAGNOSIS — R221 Localized swelling, mass and lump, neck: Secondary | ICD-10-CM | POA: Diagnosis not present

## 2024-09-29 NOTE — Progress Notes (Signed)
 FOLLOW UP  Date of Service/Encounter:  09/29/2024   Assessment:   Swelling of lip, tongue, and throat - with negative testing (MUCH improved with Claritin)   Complicated past medical history including migraines, osteoarthritis, and GERD  Plan/Recommendations:   1. Swelling of lip, tongue, and throat - Previous testing was negative to the environmental allergies and foods. - Continue with Claritin 10mg  daily as you are doing.  - I would recommend doing high dose antihistamines at the FIRST SIGN of lip swelling to see if this helps decrease the length of the episodes.  AM: Zyrtec (10mg ) + Pepcid (famotidine) 20mg    PM: Zyrtec (10mg ) + Pepcid (famotidine) 20mg    2. Return if symptoms worsen or fail to improve. You can have the follow up appointment with Dr. Iva or a Nurse Practicioner (our Nurse Practitioners are excellent and always have Physician oversight!).   Subjective:   Sharon [name] is a 73 y.o. female presenting today for follow up of  Chief Complaint  Patient presents with   Angioedema    No more swelling since starting medication.    Pruritus    Itching and dry skin    Sharon [name] has a history of the following: Patient Active Problem List   Diagnosis Date Noted   Status post revision of total replacement of left knee 06/09/2023   Liver cyst 01/28/2022   Pain in left shoulder 01/10/2021   Neck pain, acute 04/11/2020   Asymptomatic varicose veins of bilateral lower extremities 02/09/2020   Functional dyspepsia 11/22/2019   Aortic valve disorder 11/17/2019   Family history of cancer 11/17/2019   Hyperlipidemia 11/17/2019   Lactose intolerance 11/17/2019   Migraine, unspecified, not intractable, without status migrainosus 11/17/2019   Pain in right ankle and joints of right foot 07/26/2019   Status post total replacement of left hip 07/20/2018   Unilateral primary osteoarthritis, left hip 05/25/2018   Genetic testing 11/14/2016   Family  history of ovarian cancer    Family history of colon cancer    Family history of uterine cancer    Family history of stomach cancer    Osteoarthritis of left knee 04/03/2015   History of left knee replacement 04/03/2015   Gait abnormality 12/22/2012   Swelling of ankle 12/22/2012   Hypertension    Headache    Arthritis    Vitamin D  deficiency     History obtained from: chart review and patient.  Discussed the use of AI scribe software for clinical note transcription with the patient and/or guardian, who gave verbal consent to proceed.  Sharon [name] is a 73 y.o. female presenting for a follow up visit.  She was last seen in September 2025.  At that time, she had testing that was negative to most common foods as well as environmental allergies.  We did talk about starting [place] to see if this to prevent her episodes of angioedema.  We recommended continuing high-dose antihistamines at the first sign of lip swelling to see if it helped at all.  Since last visit, she has done well.   She manages her allergies with daily Claritin and experiences no breakthrough symptoms. She has not missed any doses and is unsure of the effects if she were to miss a dose. She does not currently have an EpiPen  and is uncertain about the need for one. She has Zyrtec at home as a backup.  She inquires about managing winter itchy skin, which she attributes to dry air rather than allergies.  She mentions a past episode of pneumonia, treated with levofloxacin , prescribed by a nurse practitioner. She recalls having a fever at the time but does not remember specific details about the diagnosis. The chest x-ray confirming the pneumonia is not in the current system as her doctor is not part of the home system.  She has a history of iliotibial band syndrome, causing hip and knee pain. Her sister-in-law also experienced similar issues and underwent joint replacement.  She is up to date on her vaccinations and regular  screenings. She is scheduled for a colonoscopy in January and has a follow-up with her regular physician next week. No recent sinus infections, ear infections, or pneumonias.      Component     Latest Ref Rng 06/15/2024  Complement C3, Serum     82 - 167 mg/dL 873   Complement C4, Serum     12 - 38 mg/dL 21   R8PWY Qlwrupnwjo/R8PWY Total MFr SerPl     %mean normal >105   C1INH SerPl-mCnc     21 - 39 mg/dL 31   Complement R8V     10.3 - 20.5 mg/dL 83.8   Tryptase     2.2 - 13.2 ug/L 4.6      Otherwise, there have been no changes to her past medical history, surgical history, family history, or social history.    Review of systems otherwise negative other than that mentioned in the HPI.    Objective:   Blood pressure 100/70, pulse 77, temperature 98.4 F (36.9 C), resp. rate 18, last menstrual period 02/17/1989, SpO2 95%. There is no height or weight on file to calculate BMI.    Physical Exam Vitals reviewed.  Constitutional:      Appearance: She is well-developed. She is not ill-appearing.  HENT:     Head: Normocephalic and atraumatic.     Right Ear: Tympanic membrane, ear canal and external ear normal. No drainage, swelling or tenderness. Tympanic membrane is not injected, scarred, erythematous, retracted or bulging.     Left Ear: Tympanic membrane, ear canal and external ear normal. No drainage, swelling or tenderness. Tympanic membrane is not injected, scarred, erythematous, retracted or bulging.     Nose: No nasal deformity, septal deviation, mucosal edema or rhinorrhea.     Right Turbinates: Enlarged, swollen and pale.     Left Turbinates: Enlarged, swollen and pale.     Right Sinus: No maxillary sinus tenderness or frontal sinus tenderness.     Left Sinus: No maxillary sinus tenderness or frontal sinus tenderness.     Mouth/Throat:     Mouth: Mucous membranes are not pale and not dry.     Pharynx: Uvula midline.  Eyes:     General:        Right eye: No  discharge.        Left eye: No discharge.     Conjunctiva/sclera: Conjunctivae normal.     Right eye: Right conjunctiva is not injected. No chemosis.    Left eye: Left conjunctiva is not injected. No chemosis.    Pupils: Pupils are equal, round, and reactive to light.  Cardiovascular:     Rate and Rhythm: Normal rate and regular rhythm.     Heart sounds: Normal heart sounds.  Pulmonary:     Effort: Pulmonary effort is normal. No tachypnea, accessory muscle usage or respiratory distress.     Breath sounds: Normal breath sounds. No wheezing, rhonchi or rales.     Comments: Moving air well in  all lung fields.  No increased work of breathing. Chest:     Chest wall: No tenderness.  Abdominal:     Tenderness: There is no abdominal tenderness. There is no guarding or rebound.  Lymphadenopathy:     Head:     Right side of head: No submandibular, tonsillar or occipital adenopathy.     Left side of head: No submandibular, tonsillar or occipital adenopathy.     Cervical: No cervical adenopathy.  Skin:    General: Skin is warm.     Capillary Refill: Capillary refill takes less than 2 seconds.     Coloration: Skin is not pale.     Findings: No abrasion, erythema, petechiae or rash. Rash is not papular, urticarial or vesicular.     Comments: No eczematous or urticarial lesions noted.  Neurological:     Mental Status: She is alert.  Psychiatric:        Behavior: Behavior is cooperative.      Diagnostic studies: none       Marty Shaggy, MD  [facility]  

## 2024-09-29 NOTE — Patient Instructions (Addendum)
 1. Swelling of lip, tongue, and throat - Previous testing was negative to the environmental allergies and foods. - Continue with Claritin 10mg  daily as you are doing.  - I would recommend doing high dose antihistamines at the FIRST SIGN of lip swelling to see if this helps decrease the length of the episodes.  AM: Zyrtec (10mg ) + Pepcid (famotidine) 20mg    PM: Zyrtec (10mg ) + Pepcid (famotidine) 20mg    2. Return if symptoms worsen or fail to improve. You can have the follow up appointment with Dr. Iva or a Nurse Practicioner (our Nurse Practitioners are excellent and always have Physician oversight!).    Please inform us  of any Emergency Department visits, hospitalizations, or changes in symptoms. Call us  before going to the ED for breathing or allergy  symptoms since we might be able to fit you in for a sick visit. Feel free to contact us  anytime with any questions, problems, or concerns.  It was a pleasure to see you again today!  Websites that have reliable patient information: 1. American Academy of Asthma, Allergy , and Immunology: www.aaaai.org 2. Food Allergy  Research and Education (FARE): foodallergy.org 3. Mothers of Asthmatics: http://www.asthmacommunitynetwork.org 4. [employer] of Allergy , Asthma, and Immunology: www.acaai.org      Like us  on [employer] and Instagram for our latest updates!      A healthy democracy works best when [employer] participate! Make sure you are registered to vote! If you have moved or changed any of your contact information, you will need to get this updated before voting! Scan the QR codes below to learn more!

## 2024-11-10 ENCOUNTER — Ambulatory Visit: Admitting: Podiatry

## 2024-11-10 VITALS — Ht 66.5 in | Wt 162.0 lb

## 2024-11-10 DIAGNOSIS — L84 Corns and callosities: Secondary | ICD-10-CM | POA: Diagnosis not present

## 2024-11-10 DIAGNOSIS — B351 Tinea unguium: Secondary | ICD-10-CM

## 2024-11-10 DIAGNOSIS — M2041 Other hammer toe(s) (acquired), right foot: Secondary | ICD-10-CM

## 2024-11-10 NOTE — Progress Notes (Signed)
"  °  Subjective:  Patient ID: Sharon [name], female    DOB: December 19, 1950,  MRN: [id number]  Chief Complaint  Patient presents with   Nail Problem    RM 20 Patient is here to f/u on onychomycosis. Nails are discolored and thickened.    74 y.o. female presents with the above complaint. History confirmed with patient.  She returns for follow-up has completed her laser therapy.  Objective:  Physical Exam: warm, good capillary refill, no trophic changes or ulcerative lesions, normal DP and PT pulses, normal sensory exam, and onychomycosis with thickening dystrophy and subungual debris of multiple toenails, there is improvement in the mycotic nails especially the bilateral hallux right second fourth left 2nd and 3rd toenails.  The severely dystrophic nails have not had much improvement.  Relatively unchanged since the last examination.  Callus right third toe tip with slight hammertoe contracture.   Assessment:   1. Onychomycosis   2. Hammertoe of right foot   3. Callus of foot        Plan:  Patient was evaluated and treated and all questions answered.  Has not had a lot of improvement at this point since the last visit.  I think she is maximized from a therapy standpoint with laser treatment and I do not think further laser therapy will help much with the dystrophic portions.  I debrided the nails and calluses today for her, I  applied salicylic acid to the distal third toe tip callus as well.  Discussed with her that flexor tenotomy could reduce some of the pressure from the dystrophic areas of callus on the toe if needed.  She will follow-up with me as needed if these develop or if there is any development or worsening of the toenails with infection.   Return if symptoms worsen or fail to improve.   "

## 2024-11-15 ENCOUNTER — Ambulatory Visit: Admitting: Physical Medicine and Rehabilitation

## 2024-11-23 ENCOUNTER — Encounter: Payer: Self-pay | Admitting: Gastroenterology

## 2024-11-24 ENCOUNTER — Ambulatory Visit: Admitting: Physical Medicine and Rehabilitation

## 2024-11-24 ENCOUNTER — Encounter: Payer: Self-pay | Admitting: Physical Medicine and Rehabilitation

## 2024-11-24 ENCOUNTER — Other Ambulatory Visit: Payer: Self-pay

## 2024-11-24 DIAGNOSIS — M47819 Spondylosis without myelopathy or radiculopathy, site unspecified: Secondary | ICD-10-CM

## 2024-11-24 DIAGNOSIS — M542 Cervicalgia: Secondary | ICD-10-CM

## 2024-11-24 DIAGNOSIS — M47812 Spondylosis without myelopathy or radiculopathy, cervical region: Secondary | ICD-10-CM

## 2024-11-24 DIAGNOSIS — M7918 Myalgia, other site: Secondary | ICD-10-CM

## 2024-11-24 NOTE — Progress Notes (Unsigned)
 "  Sharon [name] - 74 y.o. female MRN [id number]  Date of birth: December 13, 1950  Office Visit Note: Visit Date: 11/24/2024 PCP: Sharon Leita DEL, MD Referred by: Sharon Leita DEL, MD  Subjective: Chief Complaint  Patient presents with   Neck - Pain   HPI: Sharon [name] is a 74 y.o. female who comes in today as a self referral for evaluation of chronic, worsening and severe bilateral neck pain, intermittent radiation to shoulders. Patient is well known to us , we have treated lower back issues in the past. Pain ongoing intermittently for several years. She is under significant stress at home, her husband was recently diagnosed with dementia. Her neck pain worsens when sitting in bed to read, feels her posture is more forward flexed. Also feels her range of motion has decreased over time. She describes pain as sore and tight sensation, currently rates as 6 out of 10. Some relief of pain with home exercise regimen, heating pad, rest and use of medications. Prior cervical radiographs from 2021 show straightening of cervical lordosis, significant degenerative changes in the mid cervical spine particularly at C5-6 and C6-7 where there is narrowing of the disc space and facet sclerosis. No history of cervical surgery/injections. Patient denies focal weakness, numbness and tingling. No recent trauma or falls.         Review of Systems  Musculoskeletal:  Positive for myalgias and neck pain.  Neurological:  Negative for tingling, sensory change, focal weakness and weakness.  All other systems reviewed and are negative.  Otherwise per HPI.  Assessment & Plan: Visit Diagnoses:    ICD-10-CM   1. Cervicalgia  M54.2 XR Cervical Spine 2 or 3 views    MR CERVICAL SPINE WO CONTRAST    2. Myofascial pain syndrome  M79.18     3. Spondylosis without myelopathy or radiculopathy  M47.819     4. Facet arthropathy, cervical  M47.812        Plan: Findings:  Chronic, worsening and severe bilateral neck pain,  intermittent radiation to shoulders. Patient continues to have pain despite good conservative therapies such as home exercise regimen, rest and use of medications. Patients clinical presentation and exam are complex, differentials include facet mediated pain vs myofascial pain syndrome. She does have pain with side to side rotation of her neck today. I obtained cervical radiographs in the office today that show multi level degenerative changes, most prominent at C5-C6 and C6-C7 where there is disc height loss and facet arthropathy. Multi level anterior osteophyte formation. We discussed treatment plan in detail today. Next step is to place order for cervical MRI imaging. Depending on results of MRI imaging we would consider performing cervical facet joint injections. Would also consider re-grouping with physical therapy for strengthening and range of motion. She has no questions at this time. Her exam today was non focal, good strength noted to bilateral upper extremities. No myelopathic symptoms noted. We will see her back for cervical MRI review.     Meds & Orders: No orders of the defined types were placed in this encounter.   Orders Placed This Encounter  Procedures   XR Cervical Spine 2 or 3 views   MR CERVICAL SPINE WO CONTRAST    Follow-up: Return for Cervical MRI review.   Procedures: No procedures performed      Clinical History: CLINICAL DATA:  Low back pain with right hip and leg pain for 1 month. Right foot numbness. No known injury or prior relevant surgery.  EXAM: MRI LUMBAR SPINE WITHOUT CONTRAST   TECHNIQUE: Multiplanar, multisequence MR imaging of the lumbar spine was performed. No intravenous contrast was administered.   COMPARISON:  Lumbar MRI 01/26/2021 and 11/04/2017.   FINDINGS: Segmentation: Conventional anatomy assumed, with the last open disc space designated L5-S1.Concordant with previous imaging.   Alignment: Convex right scoliosis centered at L3, similar  to previous study. Normal lateral alignment.   Vertebrae: No worrisome osseous lesion, acute fracture or pars defect. Previously demonstrated endplate degenerative changes at L1-2 have mildly improved. Mild sacroiliac degenerative changes bilaterally.   Conus medullaris: Extends to the L1-2 level and appears normal.   Paraspinal and other soft tissues: No significant paraspinal findings. Simple appearing cyst in the upper pole of the right kidney for which no follow-up imaging is recommended.   Disc levels:   No significant disc space findings at T11-12 or T12-L1.   L1-2: Chronic spondylosis with loss of disc height, annular disc bulging and endplate osteophytes. Endplate edema noted previously has improved. Mild left foraminal narrowing without nerve root encroachment.   L2-3: Preserved disc height. Mild disc bulging and facet hypertrophy. No spinal stenosis or nerve root encroachment.   L3-4: Stable mild loss of disc height with annular disc bulging eccentric to the left. Mild facet and ligamentous hypertrophy. Stable mild narrowing of the left lateral recess and left foramen without nerve root encroachment.   L4-5: Mild loss of disc height with annular disc bulging. Moderate facet and ligamentous hypertrophy. Stable mild lateral recess narrowing bilaterally. The foramina appear sufficiently patent.   L5-S1: Chronic loss of disc height with annular disc bulging and endplate osteophytes asymmetric to the right. Mild facet and ligamentous hypertrophy. There is right foraminal narrowing which appears progressive with probable right L5 nerve root encroachment. Stable mild narrowing of the right lateral recess and left foramen.   IMPRESSION: 1. Compared with previous MRI from 2018, there is progressive right foraminal narrowing at L5-S1 due to disc bulging, endplate osteophytes and facet hypertrophy. There is probable right L5 nerve root encroachment. 2. No other  significant changes, spinal stenosis or nerve root encroachment. Stable mild lateral recess narrowing bilaterally at L4-5 and mild narrowing of the left lateral recess and left foramen at L3-4. 3. Chronic spondylosis at L1-2 with improved endplate edema.     Electronically Signed   By: Elsie Perone M.D.   On: 07/26/2022 13:59   She reports that she has never smoked. She has never used smokeless tobacco. No results for input(s): HGBA1C, LABURIC in the last 8760 hours.  Objective:  VS:  HT:    WT:   BMI:     BP:   HR: bpm  TEMP: ( )  RESP:  Physical Exam Vitals and nursing note reviewed.  HENT:     Head: Normocephalic and atraumatic.     Right Ear: External ear normal.     Left Ear: External ear normal.     Nose: Nose normal.     Mouth/Throat:     Mouth: Mucous membranes are moist.  Eyes:     Extraocular Movements: Extraocular movements intact.  Cardiovascular:     Rate and Rhythm: Normal rate.     Pulses: Normal pulses.  Pulmonary:     Effort: Pulmonary effort is normal.  Abdominal:     General: Abdomen is flat. There is distension.  Musculoskeletal:        General: Tenderness present.     Cervical back: Tenderness present.     Comments: Mild  pain with side-to-side rotation. Patient has good strength in the upper extremities including 5 out of 5 strength in wrist extension, long finger flexion and APB. Shoulder range of motion is full bilaterally without any sign of impingement. There is no atrophy of the hands intrinsically. Sensation intact bilaterally. Myofascial tenderness noted to bilateral levator scapulae regions upon palpation. Negative Hoffman's sign. Negative Spurling's sign.     Skin:    General: Skin is warm and dry.     Capillary Refill: Capillary refill takes less than 2 seconds.  Neurological:     General: No focal deficit present.     Mental Status: She is alert and oriented to person, place, and time.  Psychiatric:        Mood and Affect: Mood  normal.        Behavior: Behavior normal.     Ortho Exam  Imaging: XR Cervical Spine 2 or 3 views Result Date: 11/24/2024 AP and lateral radiographs show straightening of cervical lordosis, there are advanced multi level degenerative changes including disc height loss and facet arthropathy at C5-C6 and C6-C7. There is multi level anterior osteophyte formation. No spondylolisthesis. No fractures.    Past Medical/Family/Surgical/Social History: Medications & Allergies reviewed per EMR, new medications updated. Patient Active Problem List   Diagnosis Date Noted   Status post revision of total replacement of left knee 06/09/2023   Liver cyst 01/28/2022   Pain in left shoulder 01/10/2021   Neck pain, acute 04/11/2020   Asymptomatic varicose veins of bilateral lower extremities 02/09/2020   Functional dyspepsia 11/22/2019   Aortic valve disorder 11/17/2019   Family history of cancer 11/17/2019   Hyperlipidemia 11/17/2019   Lactose intolerance 11/17/2019   Migraine, unspecified, not intractable, without status migrainosus 11/17/2019   Pain in right ankle and joints of right foot 07/26/2019   Status post total replacement of left hip 07/20/2018   Unilateral primary osteoarthritis, left hip 05/25/2018   Genetic testing 11/14/2016   Family history of ovarian cancer    Family history of colon cancer    Family history of uterine cancer    Family history of stomach cancer    Osteoarthritis of left knee 04/03/2015   History of left knee replacement 04/03/2015   Gait abnormality 12/22/2012   Swelling of ankle 12/22/2012   Hypertension    Headache    Arthritis    Vitamin D  deficiency    Past Medical History:  Diagnosis Date   Angio-edema    Cataract    Family history of adverse reaction to anesthesia    mom did; don't know what happened (07/20/2018)   Family history of colon cancer    Family history of ovarian cancer    Family history of stomach cancer    Family history of uterine  cancer    Fibroid    GERD (gastroesophageal reflux disease)    Heart murmur    tiny, tiny one (07/20/2018)   High cholesterol    Hypertension    Migraine    a few/year; last one was 07/19/2018 (07/20/2018)   Osteoarthritis    knees, hips, hands, probably in my back (07/20/2018)   Recurrent upper respiratory infection (URI)    Ringworm    Vitamin D  deficiency    Family History  Problem Relation Age of Onset   Ovarian cancer Mother 40   Heart disease Maternal Grandfather    Hypertension Paternal Grandmother    Ovarian cancer Cousin        maternal first cousin  dx in her 69s   Heart disease Father    Atrial fibrillation Father    Heart disease Brother    Heart disease Brother    Colon cancer Maternal Aunt        dx in her 12s-60   Diabetes Paternal Aunt    Ovarian cancer Maternal Grandmother        dx in her mid 81s   Cancer Maternal Grandmother        OVARIAN AND UTERINE   Uterine cancer Maternal Grandmother        dx in her early 24s   Stomach cancer Maternal Uncle        dx in his 32s-80s   Stroke Maternal Uncle    Leukemia Paternal Uncle    Past Surgical History:  Procedure Laterality Date   APPENDECTOMY  1969   BUNIONECTOMY Bilateral    EYE SURGERY Bilateral    JOINT REPLACEMENT     KNEE ARTHROSCOPY Left 11/2013   Precancerous lesion excised     arm   TONSILLECTOMY     TOTAL ABDOMINAL HYSTERECTOMY  1990   TAH BSO   TOTAL HIP ARTHROPLASTY Left 07/20/2018   TOTAL HIP ARTHROPLASTY Left 07/20/2018   Procedure: LEFT TOTAL HIP ARTHROPLASTY ANTERIOR APPROACH;  Surgeon: Vernetta Lonni GRADE, MD;  Location: [facility] OR;  Service: Orthopedics;  Laterality: Left;   TOTAL KNEE ARTHROPLASTY Right 2012   TOTAL KNEE ARTHROPLASTY Left 04/03/2015   Procedure: TOTAL KNEE ARTHROPLASTY;  Surgeon: Maude LELON Right, MD;  Location: [facility] OR;  Service: Orthopedics;  Laterality: Left;   TOTAL KNEE REVISION Left 06/09/2023   Procedure: Revision of Poly Lateral for Left Knee;   Surgeon: Vernetta Lonni GRADE, MD;  Location: [facility] ORS;  Service: Orthopedics;  Laterality: Left;   Social History   Occupational History   Not on file  Tobacco Use   Smoking status: Never   Smokeless tobacco: Never  Vaping Use   Vaping status: Never Used  Substance and Sexual Activity   Alcohol  use: Yes    Alcohol /week: 1.0 - 2.0 standard drink of alcohol     Types: 1 - 2 Standard drinks or equivalent per week    Comment: rarely   Drug use: No   Sexual activity: Not Currently    Partners: Male    Birth control/protection: Surgical, Post-menopausal    "

## 2024-11-24 NOTE — Progress Notes (Unsigned)
 Pain Scale   Average Pain 2 Patient advising she has chronic neck pain that radiates to bilateral shoulders pain comes and goes        +Driver, -BT, -Dye Allergies.
